# Patient Record
Sex: Male | Born: 1935 | Race: White | Hispanic: No | Marital: Married | Smoking: Former smoker
Health system: Southern US, Community
[De-identification: ages and names within clinical notes are randomized; demographics above are authoritative.]

## PROBLEM LIST (undated history)

## (undated) DIAGNOSIS — I480 Paroxysmal atrial fibrillation: Secondary | ICD-10-CM

## (undated) DIAGNOSIS — Z85828 Personal history of other malignant neoplasm of skin: Secondary | ICD-10-CM

## (undated) DIAGNOSIS — N183 Chronic kidney disease, stage 3 unspecified: Secondary | ICD-10-CM

## (undated) DIAGNOSIS — I10 Essential (primary) hypertension: Secondary | ICD-10-CM

## (undated) DIAGNOSIS — N200 Calculus of kidney: Secondary | ICD-10-CM

## (undated) DIAGNOSIS — R58 Hemorrhage, not elsewhere classified: Secondary | ICD-10-CM

## (undated) DIAGNOSIS — I82409 Acute embolism and thrombosis of unspecified deep veins of unspecified lower extremity: Secondary | ICD-10-CM

## (undated) DIAGNOSIS — K5790 Diverticulosis of intestine, part unspecified, without perforation or abscess without bleeding: Secondary | ICD-10-CM

## (undated) DIAGNOSIS — Z86718 Personal history of other venous thrombosis and embolism: Secondary | ICD-10-CM

## (undated) DIAGNOSIS — L97909 Non-pressure chronic ulcer of unspecified part of unspecified lower leg with unspecified severity: Secondary | ICD-10-CM

## (undated) DIAGNOSIS — K219 Gastro-esophageal reflux disease without esophagitis: Secondary | ICD-10-CM

## (undated) DIAGNOSIS — N179 Acute kidney failure, unspecified: Secondary | ICD-10-CM

## (undated) DIAGNOSIS — I8390 Asymptomatic varicose veins of unspecified lower extremity: Secondary | ICD-10-CM

## (undated) DIAGNOSIS — E785 Hyperlipidemia, unspecified: Secondary | ICD-10-CM

## (undated) DIAGNOSIS — N281 Cyst of kidney, acquired: Secondary | ICD-10-CM

## (undated) DIAGNOSIS — E119 Type 2 diabetes mellitus without complications: Secondary | ICD-10-CM

## (undated) DIAGNOSIS — I70299 Other atherosclerosis of native arteries of extremities, unspecified extremity: Secondary | ICD-10-CM

## (undated) DIAGNOSIS — K439 Ventral hernia without obstruction or gangrene: Secondary | ICD-10-CM

## (undated) DIAGNOSIS — M1990 Unspecified osteoarthritis, unspecified site: Secondary | ICD-10-CM

## (undated) HISTORY — DX: Other atherosclerosis of native arteries of extremities, unspecified extremity: I70.299

## (undated) HISTORY — DX: Ventral hernia without obstruction or gangrene: K43.9

## (undated) HISTORY — DX: Asymptomatic varicose veins of unspecified lower extremity: I83.90

## (undated) HISTORY — DX: Diverticulosis of intestine, part unspecified, without perforation or abscess without bleeding: K57.90

## (undated) HISTORY — DX: Calculus of kidney: N20.0

## (undated) HISTORY — PX: HERNIA REPAIR: SHX51

## (undated) HISTORY — DX: Chronic kidney disease, stage 3 unspecified: N18.30

## (undated) HISTORY — DX: Acute embolism and thrombosis of unspecified deep veins of unspecified lower extremity: I82.409

## (undated) HISTORY — DX: Paroxysmal atrial fibrillation: I48.0

## (undated) HISTORY — DX: Non-pressure chronic ulcer of unspecified part of unspecified lower leg with unspecified severity: L97.909

## (undated) HISTORY — DX: Chronic kidney disease, stage 3 (moderate): N18.3

## (undated) HISTORY — DX: Essential (primary) hypertension: I10

## (undated) HISTORY — DX: Type 2 diabetes mellitus without complications: E11.9

## (undated) HISTORY — DX: Hyperlipidemia, unspecified: E78.5

## (undated) HISTORY — DX: Personal history of other venous thrombosis and embolism: Z86.718

## (undated) HISTORY — DX: Hemorrhage, not elsewhere classified: R58

## (undated) HISTORY — DX: Cyst of kidney, acquired: N28.1

## (undated) HISTORY — DX: Personal history of other malignant neoplasm of skin: Z85.828

---

## 1958-10-30 HISTORY — PX: SKIN GRAFT: SHX250

## 1968-10-29 HISTORY — PX: OTHER SURGICAL HISTORY: SHX169

## 1981-02-28 HISTORY — PX: CHOLECYSTECTOMY: SHX55

## 1998-02-28 HISTORY — PX: CATARACT EXTRACTION: SUR2

## 2004-02-09 ENCOUNTER — Ambulatory Visit: Payer: Self-pay | Admitting: Cardiology

## 2004-02-29 HISTORY — PX: ESOPHAGOGASTRODUODENOSCOPY: SHX1529

## 2004-03-03 ENCOUNTER — Ambulatory Visit: Payer: Self-pay | Admitting: Internal Medicine

## 2004-03-05 ENCOUNTER — Ambulatory Visit (HOSPITAL_COMMUNITY): Admission: RE | Admit: 2004-03-05 | Discharge: 2004-03-05 | Payer: Self-pay | Admitting: Internal Medicine

## 2004-03-05 ENCOUNTER — Ambulatory Visit: Payer: Self-pay | Admitting: Internal Medicine

## 2004-03-08 ENCOUNTER — Ambulatory Visit (HOSPITAL_COMMUNITY): Admission: RE | Admit: 2004-03-08 | Discharge: 2004-03-08 | Payer: Self-pay | Admitting: Internal Medicine

## 2005-02-28 DIAGNOSIS — Z86718 Personal history of other venous thrombosis and embolism: Secondary | ICD-10-CM

## 2005-02-28 HISTORY — DX: Personal history of other venous thrombosis and embolism: Z86.718

## 2006-02-03 ENCOUNTER — Ambulatory Visit (HOSPITAL_COMMUNITY): Admission: RE | Admit: 2006-02-03 | Discharge: 2006-02-04 | Payer: Self-pay | Admitting: Specialist

## 2006-02-28 HISTORY — PX: ROTATOR CUFF REPAIR: SHX139

## 2006-03-02 ENCOUNTER — Ambulatory Visit: Payer: Self-pay | Admitting: Internal Medicine

## 2006-04-06 ENCOUNTER — Ambulatory Visit (HOSPITAL_COMMUNITY): Admission: RE | Admit: 2006-04-06 | Discharge: 2006-04-06 | Payer: Self-pay | Admitting: Internal Medicine

## 2006-04-06 ENCOUNTER — Ambulatory Visit: Payer: Self-pay | Admitting: Internal Medicine

## 2006-04-06 HISTORY — PX: COLONOSCOPY: SHX174

## 2006-08-08 ENCOUNTER — Ambulatory Visit: Payer: Self-pay | Admitting: Cardiology

## 2006-08-11 ENCOUNTER — Ambulatory Visit: Payer: Self-pay | Admitting: Physician Assistant

## 2006-08-14 ENCOUNTER — Ambulatory Visit: Payer: Self-pay | Admitting: Physician Assistant

## 2006-08-17 ENCOUNTER — Ambulatory Visit: Payer: Self-pay | Admitting: Physician Assistant

## 2006-08-21 ENCOUNTER — Ambulatory Visit: Payer: Self-pay | Admitting: Physician Assistant

## 2006-09-20 ENCOUNTER — Ambulatory Visit: Payer: Self-pay | Admitting: Cardiology

## 2006-10-18 ENCOUNTER — Ambulatory Visit: Payer: Self-pay | Admitting: Cardiology

## 2006-11-01 ENCOUNTER — Ambulatory Visit: Payer: Self-pay | Admitting: Cardiology

## 2006-11-23 ENCOUNTER — Ambulatory Visit: Payer: Self-pay | Admitting: Cardiology

## 2006-12-01 ENCOUNTER — Ambulatory Visit: Payer: Self-pay | Admitting: Cardiology

## 2006-12-07 ENCOUNTER — Ambulatory Visit: Payer: Self-pay | Admitting: Cardiology

## 2007-01-02 ENCOUNTER — Ambulatory Visit: Payer: Self-pay | Admitting: Cardiology

## 2007-03-01 HISTORY — PX: REPLACEMENT TOTAL KNEE: SUR1224

## 2007-03-16 ENCOUNTER — Inpatient Hospital Stay (HOSPITAL_COMMUNITY): Admission: RE | Admit: 2007-03-16 | Discharge: 2007-03-20 | Payer: Self-pay | Admitting: Specialist

## 2007-04-12 ENCOUNTER — Emergency Department (HOSPITAL_COMMUNITY): Admission: EM | Admit: 2007-04-12 | Discharge: 2007-04-12 | Payer: Self-pay | Admitting: Emergency Medicine

## 2007-05-14 ENCOUNTER — Ambulatory Visit: Payer: Self-pay | Admitting: Cardiology

## 2007-05-14 ENCOUNTER — Encounter: Admission: RE | Admit: 2007-05-14 | Discharge: 2007-05-14 | Payer: Self-pay | Admitting: Specialist

## 2007-05-18 ENCOUNTER — Ambulatory Visit: Payer: Self-pay | Admitting: Cardiology

## 2007-05-21 ENCOUNTER — Ambulatory Visit: Payer: Self-pay | Admitting: Cardiology

## 2007-05-28 ENCOUNTER — Ambulatory Visit: Payer: Self-pay | Admitting: Cardiology

## 2007-06-25 ENCOUNTER — Ambulatory Visit: Payer: Self-pay | Admitting: Cardiology

## 2007-07-11 ENCOUNTER — Ambulatory Visit: Payer: Self-pay | Admitting: Cardiology

## 2007-07-24 ENCOUNTER — Ambulatory Visit: Payer: Self-pay | Admitting: Cardiology

## 2007-08-10 ENCOUNTER — Ambulatory Visit: Payer: Self-pay | Admitting: Cardiology

## 2007-09-14 ENCOUNTER — Ambulatory Visit: Payer: Self-pay | Admitting: Cardiology

## 2007-10-12 ENCOUNTER — Ambulatory Visit: Payer: Self-pay | Admitting: Cardiology

## 2007-10-19 ENCOUNTER — Ambulatory Visit: Payer: Self-pay | Admitting: Cardiology

## 2007-11-09 ENCOUNTER — Ambulatory Visit: Payer: Self-pay | Admitting: Cardiology

## 2007-12-07 ENCOUNTER — Ambulatory Visit: Payer: Self-pay | Admitting: Cardiology

## 2007-12-28 ENCOUNTER — Ambulatory Visit: Payer: Self-pay | Admitting: Cardiology

## 2008-01-29 ENCOUNTER — Ambulatory Visit: Payer: Self-pay | Admitting: Cardiology

## 2008-02-26 ENCOUNTER — Ambulatory Visit: Payer: Self-pay | Admitting: Cardiology

## 2008-03-25 ENCOUNTER — Ambulatory Visit: Payer: Self-pay | Admitting: Cardiology

## 2008-03-27 ENCOUNTER — Encounter: Admission: RE | Admit: 2008-03-27 | Discharge: 2008-03-27 | Payer: Self-pay | Admitting: Specialist

## 2008-04-08 ENCOUNTER — Ambulatory Visit: Payer: Self-pay | Admitting: Cardiology

## 2008-04-21 ENCOUNTER — Ambulatory Visit (HOSPITAL_COMMUNITY): Admission: RE | Admit: 2008-04-21 | Discharge: 2008-04-21 | Payer: Self-pay | Admitting: Specialist

## 2008-04-22 ENCOUNTER — Ambulatory Visit: Payer: Self-pay | Admitting: Cardiology

## 2008-05-13 ENCOUNTER — Ambulatory Visit: Payer: Self-pay | Admitting: Cardiology

## 2008-06-20 ENCOUNTER — Ambulatory Visit: Payer: Self-pay | Admitting: Cardiology

## 2008-07-11 ENCOUNTER — Encounter: Payer: Self-pay | Admitting: Cardiology

## 2008-07-14 LAB — ABI

## 2008-07-15 ENCOUNTER — Ambulatory Visit: Payer: Self-pay | Admitting: Cardiology

## 2008-08-12 ENCOUNTER — Ambulatory Visit: Payer: Self-pay | Admitting: Cardiology

## 2008-08-14 ENCOUNTER — Ambulatory Visit: Payer: Self-pay | Admitting: Cardiology

## 2008-08-15 ENCOUNTER — Encounter: Payer: Self-pay | Admitting: Cardiology

## 2008-09-02 ENCOUNTER — Ambulatory Visit: Payer: Self-pay | Admitting: Cardiology

## 2008-09-16 ENCOUNTER — Ambulatory Visit: Payer: Self-pay

## 2008-10-13 ENCOUNTER — Encounter: Payer: Self-pay | Admitting: *Deleted

## 2008-10-14 ENCOUNTER — Ambulatory Visit: Payer: Self-pay | Admitting: Cardiology

## 2008-10-14 LAB — CONVERTED CEMR LAB: POC INR: 2.7

## 2008-11-11 ENCOUNTER — Ambulatory Visit: Payer: Self-pay | Admitting: Cardiology

## 2008-11-20 ENCOUNTER — Encounter: Payer: Self-pay | Admitting: Cardiology

## 2008-11-21 ENCOUNTER — Ambulatory Visit: Payer: Self-pay | Admitting: Cardiology

## 2008-11-21 LAB — CONVERTED CEMR LAB: POC INR: 2.4

## 2008-12-12 ENCOUNTER — Ambulatory Visit: Payer: Self-pay | Admitting: Cardiology

## 2008-12-12 LAB — CONVERTED CEMR LAB: POC INR: 3.2

## 2008-12-15 DIAGNOSIS — I480 Paroxysmal atrial fibrillation: Secondary | ICD-10-CM

## 2008-12-15 DIAGNOSIS — I10 Essential (primary) hypertension: Secondary | ICD-10-CM | POA: Insufficient documentation

## 2008-12-15 DIAGNOSIS — E785 Hyperlipidemia, unspecified: Secondary | ICD-10-CM

## 2009-01-13 ENCOUNTER — Ambulatory Visit: Payer: Self-pay | Admitting: Cardiology

## 2009-02-10 ENCOUNTER — Ambulatory Visit: Payer: Self-pay | Admitting: Cardiology

## 2009-03-20 ENCOUNTER — Ambulatory Visit: Payer: Self-pay | Admitting: Cardiology

## 2009-04-21 ENCOUNTER — Ambulatory Visit: Payer: Self-pay | Admitting: Cardiology

## 2009-04-21 LAB — CONVERTED CEMR LAB: POC INR: 2

## 2009-05-15 ENCOUNTER — Ambulatory Visit: Payer: Self-pay | Admitting: Cardiology

## 2009-06-12 ENCOUNTER — Ambulatory Visit: Payer: Self-pay | Admitting: Cardiology

## 2009-06-12 LAB — CONVERTED CEMR LAB: POC INR: 2.9

## 2009-07-10 ENCOUNTER — Ambulatory Visit: Payer: Self-pay | Admitting: Cardiology

## 2009-07-10 LAB — CONVERTED CEMR LAB: POC INR: 2.5

## 2009-08-11 ENCOUNTER — Ambulatory Visit: Payer: Self-pay | Admitting: Cardiology

## 2009-08-11 LAB — CONVERTED CEMR LAB: POC INR: 3.4

## 2009-08-25 ENCOUNTER — Ambulatory Visit: Payer: Self-pay | Admitting: Cardiology

## 2009-09-22 ENCOUNTER — Ambulatory Visit: Payer: Self-pay | Admitting: Cardiology

## 2009-10-23 ENCOUNTER — Ambulatory Visit: Payer: Self-pay | Admitting: Cardiology

## 2009-10-23 LAB — CONVERTED CEMR LAB: POC INR: 3.6

## 2009-11-03 ENCOUNTER — Ambulatory Visit: Payer: Self-pay | Admitting: Cardiology

## 2009-11-17 ENCOUNTER — Ambulatory Visit: Payer: Self-pay | Admitting: Cardiology

## 2009-12-01 ENCOUNTER — Ambulatory Visit: Payer: Self-pay | Admitting: Cardiology

## 2009-12-01 LAB — CONVERTED CEMR LAB: POC INR: 2.7

## 2009-12-29 ENCOUNTER — Ambulatory Visit: Payer: Self-pay | Admitting: Cardiology

## 2009-12-29 LAB — CONVERTED CEMR LAB: POC INR: 2

## 2010-01-28 DIAGNOSIS — R58 Hemorrhage, not elsewhere classified: Secondary | ICD-10-CM

## 2010-01-28 HISTORY — DX: Hemorrhage, not elsewhere classified: R58

## 2010-01-29 ENCOUNTER — Ambulatory Visit: Payer: Self-pay | Admitting: Cardiology

## 2010-02-23 ENCOUNTER — Encounter: Payer: Self-pay | Admitting: Cardiology

## 2010-02-23 ENCOUNTER — Emergency Department (HOSPITAL_COMMUNITY)
Admission: EM | Admit: 2010-02-23 | Discharge: 2010-02-23 | Payer: Self-pay | Source: Home / Self Care | Admitting: Emergency Medicine

## 2010-02-23 ENCOUNTER — Encounter (INDEPENDENT_AMBULATORY_CARE_PROVIDER_SITE_OTHER): Payer: Self-pay | Admitting: Emergency Medicine

## 2010-02-26 ENCOUNTER — Ambulatory Visit: Payer: Self-pay | Admitting: Cardiology

## 2010-02-26 LAB — CONVERTED CEMR LAB: POC INR: 3.8

## 2010-03-05 ENCOUNTER — Encounter: Payer: Self-pay | Admitting: Cardiology

## 2010-03-05 LAB — CONVERTED CEMR LAB: POC INR: 2.6

## 2010-03-08 ENCOUNTER — Ambulatory Visit
Admission: RE | Admit: 2010-03-08 | Discharge: 2010-03-08 | Payer: Self-pay | Source: Home / Self Care | Attending: Cardiology | Admitting: Cardiology

## 2010-03-08 ENCOUNTER — Encounter: Payer: Self-pay | Admitting: Cardiology

## 2010-03-08 DIAGNOSIS — I82409 Acute embolism and thrombosis of unspecified deep veins of unspecified lower extremity: Secondary | ICD-10-CM | POA: Insufficient documentation

## 2010-03-08 DIAGNOSIS — R58 Hemorrhage, not elsewhere classified: Secondary | ICD-10-CM | POA: Insufficient documentation

## 2010-03-08 LAB — CONVERTED CEMR LAB: POC INR: 2.4

## 2010-03-18 ENCOUNTER — Encounter: Payer: Self-pay | Admitting: Cardiology

## 2010-03-21 ENCOUNTER — Encounter: Payer: Self-pay | Admitting: Specialist

## 2010-03-23 ENCOUNTER — Telehealth (INDEPENDENT_AMBULATORY_CARE_PROVIDER_SITE_OTHER): Payer: Self-pay | Admitting: *Deleted

## 2010-03-26 ENCOUNTER — Ambulatory Visit: Admission: RE | Admit: 2010-03-26 | Discharge: 2010-03-26 | Payer: Self-pay | Source: Home / Self Care

## 2010-03-30 NOTE — Medication Information (Signed)
Summary: ccr-lr  Anticoagulant Therapy  Managed by: Edrick Oh, RN Referring MD: Terald Sleeper PCP: Randa Spike MD: Domenic Polite MD, Mikeal Hawthorne Indication 1: Deep Vein Thrombosis - Leg (ICD-451.1) Lab Used: [place] Site: [facility] INR POC 2.8  Dietary changes: no    Health status changes: no    Bleeding/hemorrhagic complications: no    Recent/future hospitalizations: no    Any changes in medication regimen? no    Recent/future dental: no  Any missed doses?: no       Is patient compliant with meds? yes       Anticoagulation Management History:      The patient is taking warfarin and comes in today for a routine follow up visit.  Positive risk factors for bleeding include an age of 75 years or older.  The bleeding index is 'intermediate risk'.  Positive CHADS2 values include History of HTN and Age > 27 years old.  The start date was 08/11/2006.  Anticoagulation responsible provider: Domenic Polite MD, Mikeal Hawthorne.  INR POC: 2.8.  Cuvette Lot#: [id number].  Exp: 10/11.    Anticoagulation Management Assessment/Plan:      The patient's current anticoagulation dose is Warfarin sodium 5 mg tabs: take as directed by coumadin clinic, Warfarin sodium 7.5 mg tabs: Use as directed by Anticoagulation Clinic.  The target INR is 2 - 3.  The next INR is due 02/26/2010.  Anticoagulation instructions were given to patient.  Results were reviewed/authorized by Edrick Oh, RN.  He was notified by Edrick Oh RN.         Prior Anticoagulation Instructions: INR 2.0 Continue coumadin 7.5mg  once daily except 5mg  on Mondays and Fridays  Current Anticoagulation Instructions: INR 2.8 Continue coumadin 7.5mg  once daily except 5mg  on Mondays and Fridays

## 2010-03-30 NOTE — Medication Information (Signed)
Summary: ccr-lr  Anticoagulant Therapy  Managed by: Edrick Oh, RN Referring MD: Terald Sleeper PCP: Randa Spike MD: Dannielle Burn MD, Luvenia Heller Indication 1: Deep Vein Thrombosis - Leg (ICD-451.1) Lab Used: [place] Site: [facility] INR POC 2.0  Dietary changes: no    Health status changes: no    Bleeding/hemorrhagic complications: yes       Details: nose bleed x 1  Recent/future hospitalizations: no    Any changes in medication regimen? no    Recent/future dental: no  Any missed doses?: yes     Details: held 1 dose due to nose bleed  Is patient compliant with meds? no       Anticoagulation Management History:      The patient is taking warfarin and comes in today for a routine follow up visit.  Positive risk factors for bleeding include an age of 75 years or older.  The bleeding index is 'intermediate risk'.  Positive CHADS2 values include History of HTN and Age > 75 years old.  The start date was 08/11/2006.  Anticoagulation responsible provider: Dannielle Burn MD, Luvenia Heller.  INR POC: 2.0.  Cuvette Lot#: [id number].  Exp: 10/11.    Anticoagulation Management Assessment/Plan:      The patient's current anticoagulation dose is Warfarin sodium 5 mg tabs: take as directed by coumadin clinic, Warfarin sodium 7.5 mg tabs: Use as directed by Anticoagulation Clinic.  The target INR is 2 - 3.  The next INR is due 05/19/2009.  Anticoagulation instructions were given to patient.  Results were reviewed/authorized by Edrick Oh, RN.  He was notified by Edrick Oh RN.         Prior Anticoagulation Instructions: INR 2.7 Continue coumadin7.5mg  once daily except 5mg  Mondays and Fridays  Current Anticoagulation Instructions: INR 2.0 Continue coumadin 7.5mg  once daily except 5mg  on Mondays and Fridays

## 2010-03-30 NOTE — Medication Information (Signed)
Summary: ccr-lr  Anticoagulant Therapy  Managed by: Edrick Oh, RN Referring MD: Terald Sleeper PCP: Randa Spike MD: Dannielle Burn MD, Luvenia Heller Indication 1: Deep Vein Thrombosis - Leg (ICD-451.1) Lab Used: [place] Site: [facility] INR POC 2.0  Dietary changes: no    Health status changes: no    Bleeding/hemorrhagic complications: no    Recent/future hospitalizations: no    Any changes in medication regimen? no    Recent/future dental: no  Any missed doses?: yes     Details: missed 1 dose  Is patient compliant with meds? yes       Anticoagulation Management History:      The patient is taking warfarin and comes in today for a routine follow up visit.  Positive risk factors for bleeding include an age of 75 years or older.  The bleeding index is 'intermediate risk'.  Positive CHADS2 values include History of HTN and Age > 75 years old.  The start date was 08/11/2006.  Anticoagulation responsible provider: Dannielle Burn MD, Luvenia Heller.  INR POC: 2.0.  Cuvette Lot#: [id number].  Exp: 10/11.    Anticoagulation Management Assessment/Plan:      The patient's current anticoagulation dose is Warfarin sodium 5 mg tabs: take as directed by coumadin clinic, Warfarin sodium 7.5 mg tabs: Use as directed by Anticoagulation Clinic.  The target INR is 2 - 3.  The next INR is due 01/29/2010.  Anticoagulation instructions were given to patient.  Results were reviewed/authorized by Edrick Oh, RN.  He was notified by Edrick Oh RN.         Prior Anticoagulation Instructions: INR 2.7 Took last nights dose this morning Take coumadin 5mg  tonight then resume 7.5mg  once daily except 5mg  on Mondays and Fridays  Current Anticoagulation Instructions: INR 2.0 Continue coumadin 7.5mg  once daily except 5mg  on Mondays and Fridays

## 2010-03-30 NOTE — Medication Information (Signed)
Summary: CCR  Anticoagulant Therapy  Managed by: Edrick Oh, RN Referring MD: Terald Sleeper PCP: Randa Spike MD: Percival Spanish MD, Jeneen Rinks Indication 1: Deep Vein Thrombosis - Leg (ICD-451.1) Lab Used: [place] Site: [facility] INR POC 2.4  Dietary changes: no    Health status changes: no    Bleeding/hemorrhagic complications: no    Recent/future hospitalizations: no    Any changes in medication regimen? no    Recent/future dental: no  Any missed doses?: no       Is patient compliant with meds? yes       Anticoagulation Management History:      The patient is taking warfarin and comes in today for a routine follow up visit.  Positive risk factors for bleeding include an age of 11 years or older.  The bleeding index is 'intermediate risk'.  Positive CHADS2 values include History of HTN and Age > 29 years old.  The start date was 08/11/2006.  Anticoagulation responsible provider: Percival Spanish MD, Jeneen Rinks.  INR POC: 2.4.  Cuvette Lot#: [id number].  Exp: 10/11.    Anticoagulation Management Assessment/Plan:      The patient's current anticoagulation dose is Warfarin sodium 5 mg tabs: take as directed by coumadin clinic, Warfarin sodium 7.5 mg tabs: Use as directed by Anticoagulation Clinic.  The target INR is 2 - 3.  The next INR is due 11/17/2009.  Anticoagulation instructions were given to patient.  Results were reviewed/authorized by Edrick Oh, RN.  He was notified by Edrick Oh RN.         Prior Anticoagulation Instructions: INR 3.6 Hold coumadin tonight then resume 7.5mg  once daily except 5mg  on MOndays and Fridays  Current Anticoagulation Instructions: INR 2.4 Continue coumadin 7.5mg  once daily except 5mg  on Mondays and Fridays

## 2010-03-30 NOTE — Medication Information (Signed)
Summary: ccr-lr r/s due to weather 1/18  Anticoagulant Therapy  Managed by: Edrick Oh, RN Referring MD: Terald Sleeper PCP: Randa Spike MD: Domenic Polite MD, Mikeal Hawthorne Indication 1: Deep Vein Thrombosis - Leg (ICD-451.1) Lab Used: [place] Site: [facility] INR POC 2.7  Dietary changes: no    Health status changes: no    Bleeding/hemorrhagic complications: no    Recent/future hospitalizations: no    Any changes in medication regimen? no    Recent/future dental: no  Any missed doses?: no       Is patient compliant with meds? yes       Anticoagulation Management History:      The patient is taking warfarin and comes in today for a routine follow up visit.  Positive risk factors for bleeding include an age of 4 years or older.  The bleeding index is 'intermediate risk'.  Positive CHADS2 values include History of HTN and Age > 64 years old.  The start date was 08/11/2006.  Anticoagulation responsible provider: Domenic Polite MD, Mikeal Hawthorne.  INR POC: 2.7.  Cuvette Lot#: [id number].  Exp: 10/11.    Anticoagulation Management Assessment/Plan:      The patient's current anticoagulation dose is Warfarin sodium 5 mg tabs: take as directed by coumadin clinic, Warfarin sodium 7.5 mg tabs: Use as directed by Anticoagulation Clinic.  The target INR is 2 - 3.  The next INR is due 04/17/2009.  Anticoagulation instructions were given to patient.  Results were reviewed/authorized by Edrick Oh, RN.  He was notified by Edrick Oh RN.         Prior Anticoagulation Instructions: INR 2.2 Continue coumadin 7.5mg  once daily except 5mg  on Mondays and Fridays  Current Anticoagulation Instructions: INR 2.7 Continue coumadin7.5mg  once daily except 5mg  Mondays and Fridays

## 2010-03-30 NOTE — Medication Information (Signed)
Summary: ccr-lr  Anticoagulant Therapy  Managed by: Edrick Oh, RN Referring MD: Terald Sleeper PCP: Randa Spike MD: Dannielle Burn MD, Luvenia Heller Indication 1: Deep Vein Thrombosis - Leg (ICD-451.1) Lab Used: [place] Site: [facility] INR POC 3.4  Dietary changes: no    Health status changes: no    Bleeding/hemorrhagic complications: no    Recent/future hospitalizations: no    Any changes in medication regimen? no    Recent/future dental: no  Any missed doses?: no       Is patient compliant with meds? yes       Anticoagulation Management History:      The patient is taking warfarin and comes in today for a routine follow up visit.  Positive risk factors for bleeding include an age of 75 years or older.  The bleeding index is 'intermediate risk'.  Positive CHADS2 values include History of HTN and Age > 83 years old.  The start date was 08/11/2006.  Anticoagulation responsible provider: Dannielle Burn MD, Luvenia Heller.  INR POC: 3.4.  Cuvette Lot#: [id number].  Exp: 10/11.    Anticoagulation Management Assessment/Plan:      The patient's current anticoagulation dose is Warfarin sodium 5 mg tabs: take as directed by coumadin clinic, Warfarin sodium 7.5 mg tabs: Use as directed by Anticoagulation Clinic.  The target INR is 2 - 3.  The next INR is due 08/25/2009.  Anticoagulation instructions were given to patient.  Results were reviewed/authorized by Edrick Oh, RN.  He was notified by Edrick Oh RN.         Prior Anticoagulation Instructions: INR 2.5 Continue coumadin 7.5mg  once daily except 5mg  on M,F  Current Anticoagulation Instructions: INR 3.4 Hold coumadin tonight then resume 7.5mg  once daily except 5mg  on Mondays and Fridays

## 2010-03-30 NOTE — Medication Information (Signed)
Summary: ccr-lr  Anticoagulant Therapy  Managed by: Edrick Oh, RN Referring MD: Terald Sleeper PCP: Randa Spike MD: Ron Parker MD, Dellis Filbert Indication 1: Deep Vein Thrombosis - Leg (ICD-451.1) Lab Used: [place] Site: [facility] INR POC 2.7  Dietary changes: no    Health status changes: no    Bleeding/hemorrhagic complications: no    Recent/future hospitalizations: no    Any changes in medication regimen? no    Recent/future dental: no  Any missed doses?: yes     Details: missed dose last night then took dose this morning  Is patient compliant with meds? yes       Anticoagulation Management History:      The patient is taking warfarin and comes in today for a routine follow up visit.  Positive risk factors for bleeding include an age of 75 years or older.  The bleeding index is 'intermediate risk'.  Positive CHADS2 values include History of HTN and Age > 18 years old.  The start date was 08/11/2006.  Anticoagulation responsible provider: Ron Parker MD, Dellis Filbert.  INR POC: 2.7.  Cuvette Lot#: [id number].  Exp: 10/11.    Anticoagulation Management Assessment/Plan:      The patient's current anticoagulation dose is Warfarin sodium 5 mg tabs: take as directed by coumadin clinic, Warfarin sodium 7.5 mg tabs: Use as directed by Anticoagulation Clinic.  The target INR is 2 - 3.  The next INR is due 12/29/2009.  Anticoagulation instructions were given to patient.  Results were reviewed/authorized by Edrick Oh, RN.  He was notified by Edrick Oh RN.         Prior Anticoagulation Instructions: INR 1.8 Take coumadin 10mg  tonight and tomorrow night then resume 7.5mg  once daily except 5mg  on Mondays and Fridays   Current Anticoagulation Instructions: INR 2.7 Took last nights dose this morning Take coumadin 5mg  tonight then resume 7.5mg  once daily except 5mg  on Mondays and Fridays

## 2010-03-30 NOTE — Medication Information (Signed)
Summary: ccr-lr  Anticoagulant Therapy  Managed by: Edrick Oh, RN Referring MD: Terald Sleeper PCP: Randa Spike MD: Dannielle Burn MD, Luvenia Heller Indication 1: Deep Vein Thrombosis - Leg (ICD-451.1) Lab Used: [place] Site: [facility] INR POC 2.9  Dietary changes: no    Health status changes: no    Bleeding/hemorrhagic complications: no    Recent/future hospitalizations: no    Any changes in medication regimen? no    Recent/future dental: no  Any missed doses?: no       Is patient compliant with meds? yes       Anticoagulation Management History:      The patient is taking warfarin and comes in today for a routine follow up visit.  Positive risk factors for bleeding include an age of 65 years or older.  The bleeding index is 'intermediate risk'.  Positive CHADS2 values include History of HTN and Age > 79 years old.  The start date was 08/11/2006.  Anticoagulation responsible provider: Dannielle Burn MD, Luvenia Heller.  INR POC: 2.9.  Cuvette Lot#: [id number].  Exp: 10/11.    Anticoagulation Management Assessment/Plan:      The patient's current anticoagulation dose is Warfarin sodium 5 mg tabs: take as directed by coumadin clinic, Warfarin sodium 7.5 mg tabs: Use as directed by Anticoagulation Clinic.  The target INR is 2 - 3.  The next INR is due 07/10/2009.  Anticoagulation instructions were given to patient.  Results were reviewed/authorized by Edrick Oh, RN.  He was notified by Edrick Oh RN.         Prior Anticoagulation Instructions: INR 2.9 Continue coumadin 7.5mg  once daily except 5mg  on Mondays and Fridays  Current Anticoagulation Instructions: Same as Prior Instructions.

## 2010-03-30 NOTE — Medication Information (Signed)
Summary: ccr-lr  Anticoagulant Therapy  Managed by: Edrick Oh, RN Referring MD: Terald Sleeper PCP: Randa Spike MD: Ron Parker MD, Dellis Filbert Indication 1: Deep Vein Thrombosis - Leg (ICD-451.1) Lab Used: [place] Site: [facility] INR POC 2.5  Dietary changes: no    Health status changes: no    Bleeding/hemorrhagic complications: no    Recent/future hospitalizations: no    Any changes in medication regimen? no    Recent/future dental: no  Any missed doses?: no       Is patient compliant with meds? yes       Anticoagulation Management History:      The patient is taking warfarin and comes in today for a routine follow up visit.  Positive risk factors for bleeding include an age of 75 years or older.  The bleeding index is 'intermediate risk'.  Positive CHADS2 values include History of HTN and Age > 74 years old.  The start date was 08/11/2006.  Anticoagulation responsible provider: Ron Parker MD, Dellis Filbert.  INR POC: 2.5.  Cuvette Lot#: [id number].  Exp: 10/11.    Anticoagulation Management Assessment/Plan:      The patient's current anticoagulation dose is Warfarin sodium 5 mg tabs: take as directed by coumadin clinic, Warfarin sodium 7.5 mg tabs: Use as directed by Anticoagulation Clinic.  The target INR is 2 - 3.  The next INR is due 08/11/2009.  Anticoagulation instructions were given to patient.  Results were reviewed/authorized by Edrick Oh, RN.  He was notified by Edrick Oh RN.         Prior Anticoagulation Instructions: INR 2.9 Continue coumadin 7.5mg  once daily except 5mg  on Mondays and Fridays  Current Anticoagulation Instructions: INR 2.5 Continue coumadin 7.5mg  once daily except 5mg  on M,F

## 2010-03-30 NOTE — Medication Information (Signed)
Summary: ccr-lr  Anticoagulant Therapy  Managed by: Edrick Oh, RN Referring MD: Terald Sleeper PCP: Randa Spike MD: Dannielle Burn MD, Luvenia Heller Indication 1: Deep Vein Thrombosis - Leg (ICD-451.1) Lab Used: [place] Site: [facility] INR POC 1.9  Dietary changes: no    Health status changes: no    Bleeding/hemorrhagic complications: no    Recent/future hospitalizations: no    Any changes in medication regimen? no    Recent/future dental: no  Any missed doses?: no       Is patient compliant with meds? yes       Anticoagulation Management History:      The patient is taking warfarin and comes in today for a routine follow up visit.  Positive risk factors for bleeding include an age of 75 years or older.  The bleeding index is 'intermediate risk'.  Positive CHADS2 values include History of HTN and Age > 28 years old.  The start date was 08/11/2006.  Anticoagulation responsible provider: Dannielle Burn MD, Luvenia Heller.  INR POC: 1.9.  Cuvette Lot#: [id number].  Exp: 10/11.    Anticoagulation Management Assessment/Plan:      The patient's current anticoagulation dose is Warfarin sodium 5 mg tabs: take as directed by coumadin clinic, Warfarin sodium 7.5 mg tabs: Use as directed by Anticoagulation Clinic.  The target INR is 2 - 3.  The next INR is due 09/22/2009.  Anticoagulation instructions were given to patient.  Results were reviewed/authorized by Edrick Oh, RN.  He was notified by Edrick Oh RN.         Prior Anticoagulation Instructions: INR 3.4 Hold coumadin tonight then resume 7.5mg  once daily except 5mg  on Mondays and Fridays  Current Anticoagulation Instructions: INR 1.9 Take coumadin 10mg  tonight then resume 7.5mg  once daily except 5mg  on Mondays and Fridays

## 2010-03-30 NOTE — Medication Information (Signed)
Summary: ccr-lr  Anticoagulant Therapy  Managed by: Edrick Oh, RN Referring MD: Terald Sleeper PCP: Randa Spike MD: Domenic Polite MD, Mikeal Hawthorne Indication 1: Deep Vein Thrombosis - Leg (ICD-451.1) Lab Used: [place] Site: [facility] INR POC 2.7  Dietary changes: no    Health status changes: no    Bleeding/hemorrhagic complications: no    Recent/future hospitalizations: no    Any changes in medication regimen? no    Recent/future dental: no  Any missed doses?: no       Is patient compliant with meds? yes       Anticoagulation Management History:      The patient is taking warfarin and comes in today for a routine follow up visit.  Positive risk factors for bleeding include an age of 75 years or older.  The bleeding index is 'intermediate risk'.  Positive CHADS2 values include History of HTN and Age > 6 years old.  The start date was 08/11/2006.  Anticoagulation responsible provider: Domenic Polite MD, Mikeal Hawthorne.  INR POC: 2.7.  Cuvette Lot#: [id number].  Exp: 10/11.    Anticoagulation Management Assessment/Plan:      The patient's current anticoagulation dose is Warfarin sodium 5 mg tabs: take as directed by coumadin clinic, Warfarin sodium 7.5 mg tabs: Use as directed by Anticoagulation Clinic.  The target INR is 2 - 3.  The next INR is due 10/23/2009.  Anticoagulation instructions were given to patient.  Results were reviewed/authorized by Edrick Oh, RN.  He was notified by Edrick Oh RN.         Prior Anticoagulation Instructions: INR 1.9 Take coumadin 10mg  tonight then resume 7.5mg  once daily except 5mg  on Mondays and Fridays  Current Anticoagulation Instructions: INR 2.7 Continue coumadin 7.5mg  once daily except 5mg  on Mondays and Fridays

## 2010-03-30 NOTE — Medication Information (Signed)
Summary: CCR  Anticoagulant Therapy  Managed by: Edrick Oh, RN Referring MD: Terald Sleeper PCP: Randa Spike MD: Ron Parker MD, Dellis Filbert Indication 1: Deep Vein Thrombosis - Leg (ICD-451.1) Lab Used: [place] Site: [facility] INR POC 2.9  Dietary changes: no    Health status changes: no    Bleeding/hemorrhagic complications: no    Recent/future hospitalizations: no    Any changes in medication regimen? no    Recent/future dental: no  Any missed doses?: no       Is patient compliant with meds? yes       Anticoagulation Management History:      The patient is taking warfarin and comes in today for a routine follow up visit.  Positive risk factors for bleeding include an age of 75 years or older.  The bleeding index is 'intermediate risk'.  Positive CHADS2 values include History of HTN and Age > 36 years old.  The start date was 08/11/2006.  Anticoagulation responsible provider: Ron Parker MD, Dellis Filbert.  INR POC: 2.9.  Cuvette Lot#: [id number].  Exp: 10/11.    Anticoagulation Management Assessment/Plan:      The patient's current anticoagulation dose is Warfarin sodium 5 mg tabs: take as directed by coumadin clinic, Warfarin sodium 7.5 mg tabs: Use as directed by Anticoagulation Clinic.  The target INR is 2 - 3.  The next INR is due 06/12/2009.  Anticoagulation instructions were given to patient.  Results were reviewed/authorized by Edrick Oh, RN.  He was notified by Edrick Oh RN.         Prior Anticoagulation Instructions: INR 2.0 Continue coumadin 7.5mg  once daily except 5mg  on Mondays and Fridays  Current Anticoagulation Instructions: INR 2.9 Continue coumadin 7.5mg  once daily except 5mg  on Mondays and Fridays Prescriptions: WARFARIN SODIUM 7.5 MG TABS (WARFARIN SODIUM) Use as directed by Anticoagulation Clinic  #90 x 2   Entered by:   Edrick Oh RN   Authorized by:   Terald Sleeper, MD, [facility]   Signed by:   Edrick Oh RN on 05/15/2009   Method used:    Electronically to        [facility] (retail)       [address]       [place], [place]  [address]       Ph: [phone]       Fax: [phone]   RxID:   [id number]

## 2010-03-30 NOTE — Medication Information (Signed)
Summary: ccr-lr  Anticoagulant Therapy  Managed by: Edrick Oh, RN Referring MD: Terald Sleeper PCP: Randa Spike MD: Ron Parker MD, Dellis Filbert Indication 1: Deep Vein Thrombosis - Leg (ICD-451.1) Lab Used: [place] Site: [facility] INR POC 3.6  Dietary changes: no    Health status changes: no    Bleeding/hemorrhagic complications: no    Recent/future hospitalizations: no    Any changes in medication regimen? yes       Details: Is on antibiotic for removal of skin cancer  Recent/future dental: no  Any missed doses?: no       Is patient compliant with meds? yes       Anticoagulation Management History:      The patient is taking warfarin and comes in today for a routine follow up visit.  Positive risk factors for bleeding include an age of 75 years or older.  The bleeding index is 'intermediate risk'.  Positive CHADS2 values include History of HTN and Age > 62 years old.  The start date was 08/11/2006.  Anticoagulation responsible Mylo Driskill: Ron Parker MD, Dellis Filbert.  INR POC: 3.6.  Cuvette Lot#: [id number].  Exp: 10/11.    Anticoagulation Management Assessment/Plan:      The patient's current anticoagulation dose is Warfarin sodium 5 mg tabs: take as directed by coumadin clinic, Warfarin sodium 7.5 mg tabs: Use as directed by Anticoagulation Clinic.  The target INR is 2 - 3.  The next INR is due 11/20/2009.  Anticoagulation instructions were given to patient.  Results were reviewed/authorized by Edrick Oh, RN.  He was notified by Edrick Oh RN.         Prior Anticoagulation Instructions: INR 2.7 Continue coumadin 7.5mg  once daily except 5mg  on Mondays and Fridays  Current Anticoagulation Instructions: INR 3.6 Hold coumadin tonight then resume 7.5mg  once daily except 5mg  on MOndays and Fridays

## 2010-03-30 NOTE — Medication Information (Signed)
Summary: ccr-lr  Anticoagulant Therapy  Managed by: Edrick Oh, RN Referring MD: Terald Sleeper PCP: Randa Spike MD: Percival Spanish MD, Jeneen Rinks Indication 1: Deep Vein Thrombosis - Leg (ICD-451.1) Lab Used: [place] Site: [facility] INR POC 1.8  Dietary changes: no    Health status changes: no    Bleeding/hemorrhagic complications: no    Recent/future hospitalizations: no    Any changes in medication regimen? yes       Details: On abx for derm surgery  Recent/future dental: no  Any missed doses?: no       Is patient compliant with meds? yes       Anticoagulation Management History:      The patient is taking warfarin and comes in today for a routine follow up visit.  Positive risk factors for bleeding include an age of 21 years or older.  The bleeding index is 'intermediate risk'.  Positive CHADS2 values include History of HTN and Age > 72 years old.  The start date was 08/11/2006.  Anticoagulation responsible provider: Percival Spanish MD, Jeneen Rinks.  INR POC: 1.8.  Cuvette Lot#: [id number].  Exp: 10/11.    Anticoagulation Management Assessment/Plan:      The patient's current anticoagulation dose is Warfarin sodium 5 mg tabs: take as directed by coumadin clinic, Warfarin sodium 7.5 mg tabs: Use as directed by Anticoagulation Clinic.  The target INR is 2 - 3.  The next INR is due 12/01/2009.  Anticoagulation instructions were given to patient.  Results were reviewed/authorized by Edrick Oh, RN.  He was notified by Edrick Oh RN.         Prior Anticoagulation Instructions: INR 2.4 Continue coumadin 7.5mg  once daily except 5mg  on Mondays and Fridays  Current Anticoagulation Instructions: INR 1.8 Take coumadin 10mg  tonight and tomorrow night then resume 7.5mg  once daily except 5mg  on Mondays and Fridays  Prescriptions: WARFARIN SODIUM 5 MG TABS (WARFARIN SODIUM) take as directed by coumadin clinic  #90 x 3   Entered by:   Edrick Oh RN   Authorized by:    Terald Sleeper, MD, [facility]   Signed by:   Edrick Oh RN on 11/17/2009   Method used:   Electronically to        [facility] (retail)       [address]       [place], [place]  [address]       Ph: [phone]       Fax: [phone]   RxID:   [id number]

## 2010-04-01 NOTE — Medication Information (Signed)
Summary: ccr-lr  Anticoagulant Therapy  Managed by: Edrick Oh, RN Referring MD: Terald Sleeper PCP: Randa Spike MD: Domenic Polite MD, Mikeal Hawthorne Indication 1: Deep Vein Thrombosis - Leg (ICD-451.1) Lab Used: [place] Site: [facility] INR POC 3.8  Dietary changes: no    Health status changes: yes       Details: Has blood clot in joint of knee per pt   Saw Dr Theda Sers yesterday  Bleeding/hemorrhagic complications: no    Recent/future hospitalizations: yes       Details: pending knee surgery afterswelling goes down  Has appt with Dr Dannielle Burn on 03/08/10  Any changes in medication regimen? yes       Details: started on Oxycodone 5mg  prn and Robaxin 500mg  prn  Recent/future dental: no  Any missed doses?: no       Is patient compliant with meds? yes       Anticoagulation Management History:      The patient is taking warfarin and comes in today for a routine follow up visit.  Positive risk factors for bleeding include an age of 22 years or older.  The bleeding index is 'intermediate risk'.  Positive CHADS2 values include History of HTN and Age > 27 years old.  The start date was 08/11/2006.  Anticoagulation responsible provider: Domenic Polite MD, Mikeal Hawthorne.  INR POC: 3.8.  Exp: 10/11.    Anticoagulation Management Assessment/Plan:      The patient's current anticoagulation dose is Warfarin sodium 5 mg tabs: take as directed by coumadin clinic, Warfarin sodium 7.5 mg tabs: Use as directed by Anticoagulation Clinic.  The target INR is 2 - 3.  The next INR is due 03/08/2010.  Anticoagulation instructions were given to patient.  Results were reviewed/authorized by Edrick Oh, RN.  He was notified by Edrick Oh RN.         Prior Anticoagulation Instructions: INR 2.8 Continue coumadin 7.5mg  once daily except 5mg  on Mondays and Fridays  Current Anticoagulation Instructions: INR 3.8 Pending knee surgery.  On oxycodone Hold coumadin tonight then resume 7.5mg  once daily  except 5mg  on Mondays and Fridays

## 2010-04-01 NOTE — Assessment & Plan Note (Signed)
Summary: cardiac clearance for knee surgery/needs INR --agh   Visit Type:  Pre-op Evaluation Primary Provider:  Quillian Quince  CC:  need clearance for right knee surgery.  History of Present Illness: the patient is a 77 year old male with a history of recurrent DVTs.he has a history of paroxysmal atrial fibrillation with high CHADS2 score. He has a history of diabetes mellitus hypertension and dyslipidemia. However he had a normal adenosine stress study in October of 2008.  The patient has been referred for preoperative evaluation. He apparently needs a right total knee replacement. However the patient has recently suffered a spontaneous hemorrhage in the right thigh. There is evidence of hematoma in the back of the thigh and inner side of the right thigh extending to the knee and further down into the shin of the right leg. The patient does not recall falling or tripping. This occurred on Christmas day. On 27 December he had an INR checked which was greater than 3. He was ruled out for DVTand had negative Dopplers. A Baker cyst was seen in an attempt at aspiration was made. Following this he also had an aspiration of the right knee but there was no significant amount of blood.  The patient is currently normal sinus rhythm by EKG. He reports no palpitations. He reports no chest pain or shortness of breath.  Preventive Screening-Counseling & Management  Alcohol-Tobacco     Smoking Status: quit     Year Quit: 1952  Problems Prior to Update: 1)  Hypertension, Unspecified  (ICD-401.9) 2)  Hyperlipidemia-mixed  (ICD-272.4) 3)  Atrial Fibrillation  (ICD-427.31)  Medications Prior to Update: 1)  Warfarin Sodium 5 Mg Tabs (Warfarin Sodium) .... Take As Directed By Coumadin Clinic 2)  Warfarin Sodium 7.5 Mg Tabs (Warfarin Sodium) .... Use As Directed By Anticoagulation Clinic 3)  Glipizide 10 Mg Tabs (Glipizide) .... Take 1 Tablet By Mouth Once A Day 4)  Lisinopril-Hydrochlorothiazide 20-12.5 Mg Tabs  (Lisinopril-Hydrochlorothiazide) .... Take 1 Tablet By Mouth Once A Day 5)  Simvastatin 20 Mg Tabs (Simvastatin) .... Take 1 Tablet By Mouth Once A Day 6)  Aspir-Low 81 Mg Tbec (Aspirin) .... Take 1 Tablet By Mouth Once A Day 7)  Glucosamine 500 Mg Caps (Glucosamine Sulfate) .... Take 1 Tablet By Mouth Once A Day 8)  Fish Oil 1000 Mg Caps (Omega-3 Fatty Acids) .... Take 1 Tablet By Mouth Once A Day 9)  Omeprazole 40 Mg Cpdr (Omeprazole) .... Take 1 Tablet By Mouth Two Times A Day 10)  Vitamin D 1000 Unit Tabs (Cholecalciferol) .... Take 1 Tablet By Mouth Once A Day  Current Medications (verified): 1)  Warfarin Sodium 5 Mg Tabs (Warfarin Sodium) .... Take As Directed By Coumadin Clinic 2)  Warfarin Sodium 7.5 Mg Tabs (Warfarin Sodium) .... Use As Directed By Anticoagulation Clinic 3)  Glipizide 10 Mg Tabs (Glipizide) .... Take 1 Tablet By Mouth Once A Day 4)  Lisinopril-Hydrochlorothiazide 20-12.5 Mg Tabs (Lisinopril-Hydrochlorothiazide) .... Take 1 Tablet By Mouth Once A Day 5)  Simvastatin 20 Mg Tabs (Simvastatin) .... Take 1 Tablet By Mouth Once A Day 6)  Aspir-Low 81 Mg Tbec (Aspirin) .... Take 1 Tablet By Mouth Every Other Day 7)  Fish Oil 1000 Mg Caps (Omega-3 Fatty Acids) .... Take 1 Tablet By Mouth Once A Day 8)  Omeprazole 40 Mg Cpdr (Omeprazole) .... Take 1 Tablet By Mouth Two Times A Day 9)  Preservision Areds 2  Caps (Multiple Vitamins-Minerals) .... Take 1 Tablet By Mouth Two Times A Day 10)  Venastat 300 Mg Cr-Caps ([place]) .... Take 1 Tablet By Mouth Once A Day  Allergies (verified): No Known Drug Allergies  Comments:  Nurse/Medical Assistant: The patient's medication list and allergies were reviewed with the patient and were updated in the Medication and Allergy Lists.  Past History:  Past Medical History: HYPERTENSION, UNSPECIFIED (ICD-401.9) HYPERLIPIDEMIA-MIXED (ICD-272.4) ATRIAL FIBRILLATION (ICD-427.31) Type 2 diabetes mellitus Recurrent lower extremity  DVT History of paroxysmal atrial fibrillation during exercise testing December 2000 CHADS2 score is 3 Spontaneous hemorrhage of right thigh with possible over anticoagulation  Social History: Smoking Status:  quit  Review of Systems       The patient complains of joint pain and leg swelling.  The patient denies fatigue, malaise, fever, weight gain/loss, vision loss, decreased hearing, hoarseness, chest pain, palpitations, shortness of breath, prolonged cough, wheezing, sleep apnea, coughing up blood, abdominal pain, blood in stool, nausea, vomiting, diarrhea, heartburn, incontinence, blood in urine, muscle weakness, rash, skin lesions, headache, fainting, dizziness, depression, anxiety, enlarged lymph nodes, easy bruising or bleeding, and environmental allergies.    Vital Signs:  Patient profile:   77 year old male Height:      70 inches Weight:      223 pounds BMI:     32.11 Pulse rate:   80 / minute BP sitting:   109 / 67  (left arm) Cuff size:   large  Vitals Entered By: Georgina Peer (March 08, 2010 10:24 AM)  Nutrition Counseling: Patient's BMI is greater than 25 and therefore counseled on weight management options. CC: need clearance for right knee surgery   Physical Exam  Additional Exam:  General: Well-developed, well-nourished in no distress head: Normocephalic and atraumatic eyes PERRLA/EOMI intact, conjunctiva and lids normal nose: No deformity or lesions mouth normal dentition, normal posterior pharynx neck: Supple, no JVD.  No masses, thyromegaly or abnormal cervical nodes lungs: Normal breath sounds bilaterally without wheezing.  Normal percussion heart: regular rate and rhythm with normal S1 and S2, no S3 or S4.  PMI is normal.  No pathological murmurs abdomen: Normal bowel sounds, abdomen is soft and nontender without masses, organomegaly or hernias noted.  No hepatosplenomegaly musculoskeletal: Back normal, normal gait muscle strength and tone  normal pulsus: Pulse is normal in all 4 extremities Extremities: No peripheral pitting edema, resolving hematoma of the right thigh particularly the inner aspect in the back. Swelling of the right knee and old hematoma in the lower extremity. neurologic: Alert and oriented x 3 skin: Intact without lesions or rashes cervical nodes: No significant adenopathy psychologic: Normal affect    Impression & Recommendations:  Problem # 1:  UNSPECIFIED HEMORRHAGE (ICD-459.0) the patient had a spontaneous hemorrhage in the right thigh on Coumadin. I suspect his Coumadin level was elevated at least several days later was still greater than 3.0. He had significant hemorrhage with blood descending all the way into the lower part of his leg. It is also possible swelling of the right knee. The patient's INR today was 2.4 and there appears to be no further bleeding.  Problem # 2:  PREOPERATIVE EXAMINATION (ICD-V72.84) after resolution of the spontaneous hemorrhage in his leg the patient can be cleared for surgery. I will monitor him for another month or so and reassess his leg. In the meanwhile Coumadin can be continued. We will keep his INR around 2-2.5.  Problem # 3:  DEEP VENOUS THROMBOPHLEBITIS, RECURRENT (ICD-453.40) the patient will need bridging with Lovenox at the time of his total knee replacement  given his history of recurrent DVT. The patient may well have a hypercoagulable state but there is no family history of DVT at this point the patient needs to remain on Coumadin regardless as well as for a history of atrial fibrillation. Given the absence of a significant family history I do not see any reason to do a hypercoagulable workup.  Problem # 4:  ATRIAL FIBRILLATION (ICD-427.31) we briefly discussed the possible use of dabigatran in the future, but I think this would be a bad choice given the fact that this drug is not easily reversible, will require b.i.d. dosing and would incur a higher  expense.patient's Coumadin will be monitored more frequently every 2 weeks in the meanwhile His updated medication list for this problem includes:    Warfarin Sodium 5 Mg Tabs (Warfarin sodium) .Marland Kitchen... Take as directed by coumadin clinic    Warfarin Sodium 7.5 Mg Tabs (Warfarin sodium) ..... Use as directed by anticoagulation clinic    Aspir-low 81 Mg Tbec (Aspirin) .Marland Kitchen... Take 1 tablet by mouth every other day  Orders: EKG w/ Interpretation (93000) Protime INR (02725)  Problem # 5:  HYPERTENSION, UNSPECIFIED (ICD-401.9) Assessment: Comment Only  His updated medication list for this problem includes:    Lisinopril-hydrochlorothiazide 20-12.5 Mg Tabs (Lisinopril-hydrochlorothiazide) .Marland Kitchen... Take 1 tablet by mouth once a day    Aspir-low 81 Mg Tbec (Aspirin) .Marland Kitchen... Take 1 tablet by mouth every other day  Patient Instructions: 1)  Your physician recommends that you continue on your current medications as directed. Please refer to the Current Medication list given to you today. 2)  Follow up in  4-6 weeks.  Anticoagulant Therapy  Managed by: Edrick Oh, RN Referring MD: Terald Sleeper PCP: Randa Spike MD: Domenic Polite MD, Mikeal Hawthorne Indication 1: Deep Vein Thrombosis - Leg (ICD-451.1) Lab Used: [place] Site: [facility] INR POC 2.4  Vital Signs: Weight: 223 lbs.  Pulse Rate: 80  Blood Pressure:  109 / 67   Dietary changes: no    Health status changes: no    Bleeding/hemorrhagic complications: no    Recent/future hospitalizations: no    Any changes in medication regimen? no    Recent/future dental: no  Any missed doses?: no       Is patient compliant with meds? yes      Comments: discoloration area of right leg since 24th December 2011 had doppler/ultra sound at [facility]

## 2010-04-01 NOTE — Medication Information (Signed)
Summary: Coumadin Clinic  Anticoagulant Therapy  Managed by: Lovina Reach, LPN Referring MD: Terald Sleeper PCP: Randa Spike MD: Dannielle Burn MD, Luvenia Heller Indication 1: Deep Vein Thrombosis - Leg (ICD-451.1) Lab Used: [place] Site: [facility] INR POC 2.6  Dietary changes: no    Health status changes: no    Bleeding/hemorrhagic complications: no    Recent/future hospitalizations: no    Any changes in medication regimen? no    Recent/future dental: no  Any missed doses?: no       Is patient compliant with meds? yes       Allergies: No Known Drug Allergies  Anticoagulation Management History:      The patient is taking warfarin and comes in today for a routine follow up visit.  Positive risk factors for bleeding include an age of 75 years or older.  The bleeding index is 'intermediate risk'.  Positive CHADS2 values include History of HTN and Age > 75 years old.  The start date was 08/11/2006.  Anticoagulation responsible provider: Dannielle Burn MD, Luvenia Heller.  INR POC: 2.4.  Cuvette Lot#: [id number].  Exp: 10/11.    Anticoagulation Management Assessment/Plan:      The patient's current anticoagulation dose is Warfarin sodium 5 mg tabs: take as directed by coumadin clinic, Warfarin sodium 7.5 mg tabs: Use as directed by Anticoagulation Clinic.  The target INR is 2 - 3.  The next INR is due 03/26/2010.  Anticoagulation instructions were given to patient.  Results were reviewed/authorized by Lovina Reach, LPN.  He was notified by Lovina Reach, LPN.         Prior Anticoagulation Instructions: INR 3.8 Pending knee surgery.  On oxycodone Hold coumadin tonight then resume 7.5mg  once daily except 5mg  on Mondays and Fridays  Current Anticoagulation Instructions: INR 2.6 Continue coumadin 7.5mg  once daily except 5mg  on Mondays and Fridays

## 2010-04-01 NOTE — Progress Notes (Signed)
Summary: okay for physical therapy  Phone Note Call from Patient   Summary of Call: Dr. Hart Robinsons wanted pt to start physical therapy on right knee.  Seen him last on 1/16 & MD pulled some blood out.  First time that he had been able to get anything out.  Knee area swollen & does not have full movement.  Is due to see GD on 2/9 & will also see surgeon again sometime in February to decide on surgical date.  Uncomfortable about starting without your approval.  Initial call taken by: Lovina Reach, LPN,  January 24, [id number] 11:41 AM  Follow-up for Phone Call        it would be fine to start physical therapy. Follow-up by: Terald Sleeper, MD, [facility],  March 26, 2010 10:36 AM  Additional Follow-up for Phone Call Additional follow up Details #1::        Patient notified.    Additional Follow-up by: Lovina Reach, LPN,  January 27, [id number] 4:12 PM

## 2010-04-01 NOTE — Medication Information (Signed)
Summary: ccr-lr  Anticoagulant Therapy  Managed by: Edrick Oh, RN Referring MD: Terald Sleeper PCP: Randa Spike MD: Dannielle Burn MD, Luvenia Heller Indication 1: Deep Vein Thrombosis - Leg (ICD-451.1) Lab Used: [place] Site: [facility] INR POC 2.0  Dietary changes: no    Health status changes: no    Bleeding/hemorrhagic complications: no    Recent/future hospitalizations: no    Any changes in medication regimen? no    Recent/future dental: no  Any missed doses?: no       Is patient compliant with meds? yes       Allergies: No Known Drug Allergies  Anticoagulation Management History:      The patient is taking warfarin and comes in today for a routine follow up visit.  Positive risk factors for bleeding include an age of 75 years or older.  The bleeding index is 'intermediate risk'.  Positive CHADS2 values include History of HTN and Age > 75 years old.  The start date was 08/11/2006.  Anticoagulation responsible provider: Dannielle Burn MD, Luvenia Heller.  INR POC: 2.0.  Cuvette Lot#: [id number].  Exp: 10/11.    Anticoagulation Management Assessment/Plan:      The patient's current anticoagulation dose is Warfarin sodium 5 mg tabs: take as directed by coumadin clinic, Warfarin sodium 7.5 mg tabs: Use as directed by Anticoagulation Clinic.  The target INR is 2 - 3.  The next INR is due 04/08/2010.  Anticoagulation instructions were given to patient.  Results were reviewed/authorized by Edrick Oh, RN.  He was notified by Edrick Oh RN.         Prior Anticoagulation Instructions: INR 2.6 Continue coumadin 7.5mg  once daily except 5mg  on Mondays and Fridays  Current Anticoagulation Instructions: INR 2.0 Take coumadin 7.5mg  tonight then take 7.5mg  once daily except 5mg  on M,W,F till finished with antibiotic then resume 7.5mg  once daily except 5mg  on M,F Next INR check 04/08/10 at [facility] appt

## 2010-04-08 ENCOUNTER — Encounter: Payer: Self-pay | Admitting: Cardiology

## 2010-04-08 ENCOUNTER — Ambulatory Visit (INDEPENDENT_AMBULATORY_CARE_PROVIDER_SITE_OTHER): Payer: Medicare Other | Admitting: Cardiology

## 2010-04-08 DIAGNOSIS — I4891 Unspecified atrial fibrillation: Secondary | ICD-10-CM

## 2010-04-08 DIAGNOSIS — R609 Edema, unspecified: Secondary | ICD-10-CM

## 2010-04-09 ENCOUNTER — Encounter: Payer: Self-pay | Admitting: Cardiology

## 2010-04-15 NOTE — Assessment & Plan Note (Signed)
Summary: F1M-AGH/SRS   Visit Type:  Follow-up Primary Provider:  Quillian Quince   History of Present Illness: the patient is an 77 year old male with a history of recurrent DVTs and possible hypercoagulable state.  He has a history of atrial fibrillation with a high CHA2DS2-Vasc score.  He also has diabetes mellitus hypertension and dyslipidemia.  He had a normal adenosine stress study in October 2008.  During the last office visit the patient was referred for a right total knee replacement the perioperative assessment.  Prior to this visit however he had recently suffered a spontaneous hemorrhage in the right thigh  the patient did not recall any trauma.  His INR was greater than 3.  He was also ruled out for DVT and had negative Dopplers.  A Baker's cyst was seen in the event of aspiration was made.  The patient presents for follow-up. the hematoma is for a large part resolved.  However the patient states that he doesn't feel ready for an operation yet and wants to hold a little longer.The patient had no further bleeding. His electrocardiogram was reviewed in the office today. There were no acute changes.   Preventive Screening-Counseling & Management  Alcohol-Tobacco     Smoking Status: quit     Year Quit: 1957  Current Medications (verified): 1)  Warfarin Sodium 5 Mg Tabs (Warfarin Sodium) .... Take As Directed By Coumadin Clinic 2)  Warfarin Sodium 7.5 Mg Tabs (Warfarin Sodium) .... Use As Directed By Anticoagulation Clinic 3)  Glipizide 10 Mg Tabs (Glipizide) .... Take 1 Tablet By Mouth Once A Day 4)  Lisinopril-Hydrochlorothiazide 20-12.5 Mg Tabs (Lisinopril-Hydrochlorothiazide) .... Take 1&1/2 Tablet By Mouth Once A Day 5)  Simvastatin 20 Mg Tabs (Simvastatin) .... Take 1 Tablet By Mouth Once A Day 6)  Omeprazole 40 Mg Cpdr (Omeprazole) .... Take 1 Tablet By Mouth Two Times A Day 7)  Preservision Areds 2  Caps (Multiple Vitamins-Minerals) .... Take 1 Tablet By Mouth Two Times A Day 8)   Venastat 300 Mg Cr-Caps ([place]) .... Take 1 Tablet By Mouth Once A Day  Allergies (verified): No Known Drug Allergies  Comments:  Nurse/Medical Assistant: The patient's medication list and allergies were reviewed with the patient and were updated in the Medication and Allergy Lists.  Past History:  Past Medical History: Last updated: 03/08/2010 HYPERTENSION, UNSPECIFIED (ICD-401.9) HYPERLIPIDEMIA-MIXED (ICD-272.4) ATRIAL FIBRILLATION (ICD-427.31) Type 2 diabetes mellitus Recurrent lower extremity DVT History of paroxysmal atrial fibrillation during exercise testing December 2000 CHADS2 score is 3 Spontaneous hemorrhage of right thigh with possible over anticoagulation  Past Surgical History: Last updated: 12/15/2008 Cholecystectomy Rotator Cuff Repair  Family History: Last updated: 04/08/2010 No significant family history   Social History: Last updated: 12/15/2008 Retired  Married  Tobacco Use - Yes.  Alcohol Use - no Regular Exercise - yes Drug Use - no  Risk Factors: Smoking Status: quit (04/08/2010)  Family History: No significant family history   Review of Systems       The patient complains of leg swelling.  The patient denies fatigue, malaise, fever, weight gain/loss, vision loss, decreased hearing, hoarseness, chest pain, palpitations, shortness of breath, prolonged cough, wheezing, sleep apnea, coughing up blood, abdominal pain, blood in stool, nausea, vomiting, diarrhea, heartburn, incontinence, blood in urine, muscle weakness, joint pain, rash, skin lesions, headache, fainting, dizziness, depression, anxiety, enlarged lymph nodes, easy bruising or bleeding, and environmental allergies.    Vital Signs:  Patient profile:   77 year old male Height:  70 inches Weight:      226 pounds Pulse rate:   92 / minute BP sitting:   142 / 62  (left arm) Cuff size:   large  Vitals Entered By: Georgina Peer (April 08, 2010 1:30 PM)  Physical  Exam  Additional Exam:  General: Well-developed, well-nourished in no distress head: Normocephalic and atraumatic eyes PERRLA/EOMI intact, conjunctiva and lids normal nose: No deformity or lesions mouth normal dentition, normal posterior pharynx neck: Supple, no JVD.  No masses, thyromegaly or abnormal cervical nodes lungs: Normal breath sounds bilaterally without wheezing.  Normal percussion heart: regular rate and rhythm with normal S1 and S2, no S3 or S4.  PMI is normal.  No pathological murmurs abdomen: Normal bowel sounds, abdomen is soft and nontender without masses, organomegaly or hernias noted.  No hepatosplenomegaly musculoskeletal: Back normal, normal gait muscle strength and tone normal pulsus: Pulse is normal in all 4 extremities Extremities: No peripheral pitting edema, The hematoma on his leg has resolved. neurologic: Alert and oriented x 3 skin: Intact without lesions or rashes cervical nodes: No significant adenopathy psychologic: Normal affect    EKG  Procedure date:  04/08/2010  Findings:      EKG with normal sinus rhythm. Low voltage q.r.s.Marland Kitchen Otherwise no acute abnormalities. Heart rate is 97 beats per minute   Impression & Recommendations:  Problem # 1:  DEEP VENOUS THROMBOPHLEBITIS, RECURRENT (ICD-453.40) The patient is on long term anticoagulation. He has  had no further complications. Coumadin will be continued.  Orders: Protime INR (28413)  Problem # 2:  ATRIAL FIBRILLATION (ICD-427.31) Patient has no evidence of recurrent a to fibrillation.  The following medications were removed from the medication list:    Aspir-low 81 Mg Tbec (Aspirin) .Marland Kitchen... Take 1 tablet by mouth every other day His updated medication list for this problem includes:    Warfarin Sodium 5 Mg Tabs (Warfarin sodium) .Marland Kitchen... Take as directed by coumadin clinic    Warfarin Sodium 7.5 Mg Tabs (Warfarin sodium) ..... Use as directed by anticoagulation clinic  Orders: EKG w/  Interpretation (93000) Protime INR (24401)  Problem # 3:  PREOPERATIVE EXAMINATION (ICD-V72.84) Patient wants to hold off on surgery little bit longer. The patient stated he will call back when he Wants  to precede with surgery .   Patient Instructions: 1)  Your physician recommends that you continue on your current medications as directed. Please refer to the Current Medication list given to you today. 2)  Follow up in  6 months  Anticoagulant Therapy  Managed by: Edrick Oh, RN Referring MD: Terald Sleeper PCP: Randa Spike MD: Dannielle Burn MD, Luvenia Heller Indication 1: Deep Vein Thrombosis - Leg (ICD-451.1) Lab Used: [place] Site: [facility] INR POC 2.9  Vital Signs: Weight: 226 lbs.  Pulse Rate: 92  Blood Pressure:  142 / 62   Dietary changes: no    Health status changes: no    Bleeding/hemorrhagic complications: no    Recent/future hospitalizations: no    Any changes in medication regimen? no    Recent/future dental: no  Any missed doses?: no       Is patient compliant with meds? yes

## 2010-04-21 NOTE — Medication Information (Signed)
Summary: Coumadin Clinic  Anticoagulant Therapy  Managed by: Lovina Reach, LPN Referring MD: Terald Sleeper PCP: Randa Spike MD: Dannielle Burn MD, Luvenia Heller Indication 1: Deep Vein Thrombosis - Leg (ICD-451.1) Lab Used: [place] Site: [facility] INR POC 2.9  Dietary changes: no    Health status changes: no    Bleeding/hemorrhagic complications: no    Recent/future hospitalizations: no    Any changes in medication regimen? no    Recent/future dental: no  Any missed doses?: no       Is patient compliant with meds? yes       Allergies: No Known Drug Allergies  Anticoagulation Management History:      The patient is taking warfarin and comes in today for a routine follow up visit.  Positive risk factors for bleeding include an age of 75 years or older.  The bleeding index is 'intermediate risk'.  Positive CHADS2 values include History of HTN and Age > 48 years old.  The start date was 08/11/2006.  Anticoagulation responsible provider: Dannielle Burn MD, Luvenia Heller.  INR POC: 2.9.  Cuvette Lot#: [id number].  Exp: 10/11.    Anticoagulation Management Assessment/Plan:      The patient's current anticoagulation dose is Warfarin sodium 5 mg tabs: take as directed by coumadin clinic, Warfarin sodium 7.5 mg tabs: Use as directed by Anticoagulation Clinic.  The target INR is 2 - 3.  The next INR is due 04/23/2010.  Anticoagulation instructions were given to patient.  Results were reviewed/authorized by Lovina Reach, LPN.  He was notified by Lovina Reach, LPN.         Prior Anticoagulation Instructions: INR 2.0 Take coumadin 7.5mg  tonight then take 7.5mg  once daily except 5mg  on M,W,F till finished with antibiotic then resume 7.5mg  once daily except 5mg  on M,F Next INR check 04/08/10 at [facility] appt  Current Anticoagulation Instructions: INR 2.9 Continue coumadin 7.5mg  once daily except 5mg  on Mondays and Fridays

## 2010-04-27 ENCOUNTER — Encounter: Payer: Self-pay | Admitting: Cardiology

## 2010-04-27 ENCOUNTER — Encounter (INDEPENDENT_AMBULATORY_CARE_PROVIDER_SITE_OTHER): Payer: Medicare Other

## 2010-04-27 DIAGNOSIS — I80299 Phlebitis and thrombophlebitis of other deep vessels of unspecified lower extremity: Secondary | ICD-10-CM

## 2010-04-27 DIAGNOSIS — Z7901 Long term (current) use of anticoagulants: Secondary | ICD-10-CM

## 2010-04-27 LAB — CONVERTED CEMR LAB: POC INR: 1.7

## 2010-05-06 NOTE — Medication Information (Signed)
Summary: Don Hall, last check 2/9 (dose adjusted by gd)  --agh  Anticoagulant Therapy  Managed by: Edrick Oh, RN Referring MD: Terald Sleeper PCP: Randa Spike MD: Domenic Polite MD, Mikeal Hawthorne Indication 1: Deep Vein Thrombosis - Leg (ICD-451.1) Lab Used: [place] Site: [facility] INR POC 1.7  Dietary changes: no    Health status changes: no    Bleeding/hemorrhagic complications: no    Recent/future hospitalizations: no    Any changes in medication regimen? no    Recent/future dental: no  Any missed doses?: no       Is patient compliant with meds? yes       Allergies: No Known Drug Allergies  Anticoagulation Management History:      The patient is taking warfarin and comes in today for a routine follow up visit.  Positive risk factors for bleeding include an age of 75 years or older.  The bleeding index is 'intermediate risk'.  Positive CHADS2 values include History of HTN and Age > 11 years old.  The start date was 08/11/2006.  Anticoagulation responsible provider: Domenic Polite MD, Mikeal Hawthorne.  INR POC: 1.7.  Cuvette Lot#: [id number].  Exp: 10/11.    Anticoagulation Management Assessment/Plan:      The patient's current anticoagulation dose is Warfarin sodium 5 mg tabs: take as directed by coumadin clinic, Warfarin sodium 7.5 mg tabs: Use as directed by Anticoagulation Clinic.  The target INR is 2 - 3.  The next INR is due 05/11/2010.  Anticoagulation instructions were given to patient.  Results were reviewed/authorized by Edrick Oh, RN.  He was notified by Edrick Oh RN.         Prior Anticoagulation Instructions: INR 2.9 Continue coumadin 7.5mg  once daily except 5mg  on Mondays and Fridays  Current Anticoagulation Instructions: INR 1.7 Take coumadin 7.5mg  tonight then increase dose to 7.5mg  once daily except 5mg  on S,T,Th

## 2010-05-10 LAB — DIFFERENTIAL
Eosinophils Absolute: 0.1 10*3/uL (ref 0.0–0.7)
Eosinophils Relative: 1 % (ref 0–5)
Lymphocytes Relative: 6 % — ABNORMAL LOW (ref 12–46)
Lymphs Abs: 0.8 10*3/uL (ref 0.7–4.0)
Monocytes Relative: 6 % (ref 3–12)

## 2010-05-10 LAB — CBC
Hemoglobin: 12.5 g/dL — ABNORMAL LOW (ref 13.0–17.0)
MCH: 30.6 pg (ref 26.0–34.0)
MCV: 92.2 fL (ref 78.0–100.0)
Platelets: 203 10*3/uL (ref 150–400)
RBC: 4.08 MIL/uL — ABNORMAL LOW (ref 4.22–5.81)
WBC: 13.6 10*3/uL — ABNORMAL HIGH (ref 4.0–10.5)

## 2010-05-10 LAB — PROTIME-INR
INR: 3.47 — ABNORMAL HIGH (ref 0.00–1.49)
Prothrombin Time: 34.9 seconds — ABNORMAL HIGH (ref 11.6–15.2)

## 2010-05-10 LAB — BASIC METABOLIC PANEL
CO2: 20 mEq/L (ref 19–32)
Chloride: 106 mEq/L (ref 96–112)
GFR calc Af Amer: 43 mL/min — ABNORMAL LOW (ref >60)
Potassium: 4.5 mEq/L (ref 3.5–5.1)
Sodium: 136 mEq/L (ref 135–145)

## 2010-05-11 ENCOUNTER — Encounter (INDEPENDENT_AMBULATORY_CARE_PROVIDER_SITE_OTHER): Payer: Medicare Other

## 2010-05-11 ENCOUNTER — Encounter: Payer: Self-pay | Admitting: Cardiology

## 2010-05-11 DIAGNOSIS — Z7901 Long term (current) use of anticoagulants: Secondary | ICD-10-CM

## 2010-05-11 DIAGNOSIS — I80299 Phlebitis and thrombophlebitis of other deep vessels of unspecified lower extremity: Secondary | ICD-10-CM

## 2010-05-18 NOTE — Medication Information (Signed)
Summary: ccr-lr  Anticoagulant Therapy  Managed by: Edrick Oh, RN Referring MD: Terald Sleeper PCP: Randa Spike MD: Percival Spanish MD, Jeneen Rinks Indication 1: Deep Vein Thrombosis - Leg (ICD-451.1) Lab Used: [place] Site: [facility] INR POC 3.4  Dietary changes: no    Health status changes: no    Bleeding/hemorrhagic complications: no    Recent/future hospitalizations: no    Any changes in medication regimen? no    Recent/future dental: no  Any missed doses?: no       Is patient compliant with meds? yes       Allergies: No Known Drug Allergies  Anticoagulation Management History:      The patient is taking warfarin and comes in today for a routine follow up visit.  Positive risk factors for bleeding include an age of 75 years or older.  The bleeding index is 'intermediate risk'.  Positive CHADS2 values include History of HTN and Age > 32 years old.  The start date was 08/11/2006.  Anticoagulation responsible provider: Percival Spanish MD, Jeneen Rinks.  INR POC: 3.4.  Cuvette Lot#: [id number].  Exp: 10/11.    Anticoagulation Management Assessment/Plan:      The patient's current anticoagulation dose is Warfarin sodium 5 mg tabs: take as directed by coumadin clinic, Warfarin sodium 7.5 mg tabs: Use as directed by Anticoagulation Clinic.  The target INR is 2 - 3.  The next INR is due 06/08/2010.  Anticoagulation instructions were given to patient.  Results were reviewed/authorized by Edrick Oh, RN.  He was notified by Edrick Oh RN.         Prior Anticoagulation Instructions: INR 1.7 Take coumadin 7.5mg  tonight then increase dose to 7.5mg  once daily except 5mg  on S,T,Th  Current Anticoagulation Instructions: INR 3.4 Take coumadin 2.5mg  tonight then decrease dose to 5mg  once daily except 7.5mg  on M,W,F

## 2010-06-07 ENCOUNTER — Encounter: Payer: Self-pay | Admitting: Cardiology

## 2010-06-07 DIAGNOSIS — I4891 Unspecified atrial fibrillation: Secondary | ICD-10-CM

## 2010-06-07 DIAGNOSIS — Z7901 Long term (current) use of anticoagulants: Secondary | ICD-10-CM

## 2010-06-07 DIAGNOSIS — I82409 Acute embolism and thrombosis of unspecified deep veins of unspecified lower extremity: Secondary | ICD-10-CM

## 2010-06-08 ENCOUNTER — Ambulatory Visit (INDEPENDENT_AMBULATORY_CARE_PROVIDER_SITE_OTHER): Payer: Medicare Other | Admitting: *Deleted

## 2010-06-08 DIAGNOSIS — I4891 Unspecified atrial fibrillation: Secondary | ICD-10-CM

## 2010-06-08 DIAGNOSIS — I82409 Acute embolism and thrombosis of unspecified deep veins of unspecified lower extremity: Secondary | ICD-10-CM

## 2010-06-08 DIAGNOSIS — Z7901 Long term (current) use of anticoagulants: Secondary | ICD-10-CM

## 2010-06-08 LAB — POCT INR: INR: 3.5

## 2010-06-29 ENCOUNTER — Ambulatory Visit (INDEPENDENT_AMBULATORY_CARE_PROVIDER_SITE_OTHER): Payer: Medicare Other | Admitting: *Deleted

## 2010-06-29 DIAGNOSIS — I82409 Acute embolism and thrombosis of unspecified deep veins of unspecified lower extremity: Secondary | ICD-10-CM

## 2010-06-29 DIAGNOSIS — Z7901 Long term (current) use of anticoagulants: Secondary | ICD-10-CM

## 2010-06-29 DIAGNOSIS — I4891 Unspecified atrial fibrillation: Secondary | ICD-10-CM

## 2010-06-29 LAB — POCT INR: INR: 2.4

## 2010-07-13 NOTE — Assessment & Plan Note (Signed)
[place] OFFICE NOTE   NAME:Don [name], Don [name]                     MRN:          [id number]  DATE:08/14/2008                            DOB:          1927-07-26    REFERRING PHYSICIAN:  [name]   HISTORY OF PRESENT ILLNESS:  The patient is a very pleasant 77 year old  male with a history of recurrent DVT.  He also has been treated recently  for [facility].  He had arterial ABIs done  which were within normal limits.  He reports to me that the wound is  healing.  He denies any substernal chest pain or shortness of breath.  He has significant cardiac risk factors, but had negative stress test in  2008.  The patient also has type 2 diabetes mellitus and hypertension.  He was slightly hypertensive in the office today, but on repeat blood  pressure was within normal limits.  He denies any palpitations,  orthopnea, or PND.   MEDICATIONS:  1. Warfarin as directed.  2. Glipizide 10 mg 1 q.a.m.  3. Lisinopril/HCTZ 20/12.5 one and a half daily.  4. Simvastatin 20 mg p.o. daily.  5. Aspirin 81 mg p.o. daily.  6. Glucosamine 500 mg p.o. daily.  7. Omega-3 fish oil.  8. Omeprazole 40 mg p.o. b.i.d.  9. Vitamin D 1000 mg p.o. daily.   PHYSICAL EXAMINATION:  VITAL SIGNS:  Blood pressure 147/72, heart rate  66, weight is 219 pounds.  NECK:  Normal carotid upstroke.  No carotid bruits.  HEENT:  Pupils, eyes clear; conjunctivae clear.  LUNGS:  Clear breath sounds bilaterally.  HEART:  Regular rate and rhythm.  Normal S1 and S2.  No pathological  murmurs.  ABDOMEN:  Soft, nontender.  No rebound or guarding.  Good bowel sounds.  EXTREMITIES:  No cyanosis, clubbing, or edema.  NEURO:  The patient is alert, oriented, and grossly nonfocal.   PROBLEM LIST:  1. Recurrent lower extremity deep venous thrombosis, stable.  2. Chronic Coumadin secondary to recurrent lower extremity deep venous    thrombosis, stable.  3. Multiple cardiac risk factor.      a.     Normal adenosine stress cardiac study, ejection fraction       59%, October 2008.  4. History of paroxysmal atrial fibrillation.      a.     During exercise testing, December 2000.      b.     Mali score 3.  5. Type 2 diabetes mellitus.  6. Hypertension.  7. Dyslipidemia.  8. Easy bruisability.   PLAN:  1. We also gave the patient refill for his Coumadin.  We will give him      a 90 days supply 6 refills.  2. From a cardiac standpoint, he is stable.  He does not need any      stress testing at the present time.  3. The patient's blood pressure was rechecked and was within normal      limits.  He can follow up with [place] in 6 months.     Luvenia Heller  Gerlene Burdock, MD,FACC  Electronically Signed    GED/MedQ  DD: 08/14/2008  DT: 08/14/2008  Job #: [id number]   cc:   [name]

## 2010-07-13 NOTE — Consult Note (Signed)
NAME:  Don [name], Don [name] NO.:  [id number]   MEDICAL RECORD NO.:  [id number]          PATIENT TYPE:  INP   LOCATION:  1619                         FACILITY[facility]  [facility]   PHYSICIAN:  Barbette Merino, M.D.      DATE OF BIRTH:  1927/09/25   DATE OF CONSULTATION:  03/16/2007  DATE OF DISCHARGE:                                 CONSULTATION   PRIMARY CARE PHYSICIAN:  Dr. [name] from out of town.   REASON FOR CONSULTATION:  Diabetes, hypertension management.  Consult  requested for by Dr. Sydnee Cabal.   HISTORY OF PRESENT ILLNESS:  The patient is a 77 year old gentleman  admitted today for left total knee replacement.  He has longstanding  osteoarthritis of the left knee.  He has had outpatient followup and  treatment.  He was cleared for surgery by both Dr. Terald Sleeper of [facility]  Cardiology and his primary care physician.  The patient has no specific  complaints.  He was doing fine and his surgery went well.  However, he  has chronic medical problems that require close monitoring while in the  hospital.  We are consulted for further management.  At this point the  patient is doing fine post surgery and has no specific complaint.   PAST MEDICAL HISTORY:  Significant for diabetes type 2, hypertension,  dyslipidemia, GERD, history of right lower extremity DVT in June 2008,  history of paroxysmal atrial fibrillation and chronic kidney disease.   ALLERGIES:  The patient has no noted drug allergies.   MEDICATIONS:  1. Beta carotene 1 tablet daily.  2. Cholecalciferol 100 units daily.  3. Colace 100 mg b.i.d.  4. Fentanyl patch 100 mcg 1x.  5. Ferrous sulfate 325 mg daily.  6. Glipizide 10 mg daily.  7. Hydrochlorothiazide 12.5 mg daily.  8. Dilaudid for pain.  9. Lisinopril 20 mg daily.  10.Zofran 20 mg daily.  11.Phenergan p.r.n.  12.Protonix 30 mg b.i.d.   SOCIAL HISTORY:  The patient lives with his wife.  No tobacco or alcohol  use.  No IV drug use.   FAMILY  HISTORY:  Noncontributory.   REVIEW OF SYSTEMS:  12 point review of systems is negative except per  HPI.   PHYSICAL EXAMINATION:  VITAL SIGNS:  The patient is afebrile with  temperature 97, blood pressure 133/44, pulse 81, respiratory rate 16,  sats 94% on 2 liters.  GENERAL:  He is an awake, alert, oriented, pleasant man in no acute  distress.  HEENT:  PERRL.  EOMI.  NECK:  Supple.  No JVD, no lymphadenopathy.  RESPIRATORY:  Has good air entry bilaterally.  No wheezes or rales.  CARDIOVASCULAR:  The patient has S1-S2, no murmurs.  ABDOMEN:  Abdomen is soft, nontender with positive bowel sounds.  EXTREMITIES:  Left lower extremity status post total knee replacement.  Swollen knee postoperatively wrapped up.  Right lower extremity no  edema, cyanosis or clubbing.   LABS:  Are currently pending.   ASSESSMENT:  This is a 77 year old status post left total knee  replacement for osteoarthritis.  The patient is currently stable.  Other  medical problems include diabetes, hypertension, dyslipidemia, GERD  (gastroesophageal reflux disease), history of DVT (deep venous  thrombosis), history of PAF (paroxysmal atrial fibrillation) and chronic  kidney disease.   RECOMMENDATIONS:  1. Will continue the patient's home medications at this point.  2. Check hemoglobin A1c, fasting lipid panel as well as basic labs.      Apparently the patient's A1c was 6.1 late last year and his fasting      lipid panel was okay.  Continue with proton pump inhibitor.  The      patient requires DVT prophylaxis as soon as possible.   We thank you for the consult.  We will follow up with you and further  treatment will depend on the patient's initial response.      Barbette Merino, M.D.  Electronically Signed     LG/MEDQ  D:  03/16/2007  T:  03/17/2007  Job:  [id number]

## 2010-07-13 NOTE — Discharge Summary (Signed)
NAME:  Don [name], Don [name] NO.:  [id number]   MEDICAL RECORD NO.:  [id number]          PATIENT TYPE:  INP   LOCATION:  [id number]                         [facility]ACILITY:  [facility]   PHYSICIAN:  Cynda Familia, M.D.DATE OF BIRTH:  03-09-1927   DATE OF ADMISSION:  03/16/2007  DATE OF DISCHARGE:  03/20/2007                               DISCHARGE SUMMARY   ADMISSION DIAGNOSES:  1. End-stage osteoarthritis left knee.  2. Diabetes.  3. Hypertension.  4. Renal insufficiency.   DISCHARGE DIAGNOSES:  1. End-stage osteoarthritis left knee.  2. Diabetes.  3. Hypertension.  4. Renal insufficiency.   OPERATION:  Total knee arthroplasty left knee.   BRIEF HISTORY:  A 77 year old gentleman with history of end-stage  osteoarthritis of his left knee who has failed conservative management.  He had been cleared by Dr. Doreatha Lew of [facility] and his primary  care physician for surgery and after discussion of treatment with  benefits, risks and options, the patient now scheduled for total knee  arthroplasty.  He does have history of a DVT of his right calf and  postoperatively will be on Lovenox 30 mg b.i.d. for total of 3 weeks.  He has this medication at home that he had pre-filled before surgery.   LABORATORY VALUES:  Admission CBC was within normal limits.  Admission  PT/PTT within normal limits.  Admission C-met showed BUN high at 30,  creatinine high at 1.61.  Urinalysis within normal limits.  The  hemoglobin and hematocrit reached a low of 8.8 and 25.6 on the 19th.  His BUN and creatinine remained mildly elevated throughout admission. On  the 19th it was down to 25 and 1.65 which is about stable for him.  Hemoglobin A1c 5.9.   COURSE IN THE HOSPITAL:  The patient tolerated the operative procedure  well.  Medical consult was obtained with [facility] hospitalist for  maintenance of his diabetes, hypertension and renal insufficiency.  The  first postoperative day vital signs  were stable.  He was afebrile.  His  drain was removed without difficulty.  Lungs were clear.  Heart sounds  normal.  Calves were negative.  Foley catheter was kept in to monitor  his urine output with his history of renal insufficiency.  The second  postoperative day vital signs were stable.  He was afebrile.  Urine  output was good.  His dressing was changed.  His wound was benign.  His  calves were negative.  He did well on physical therapy.  Third  postoperative day he was awake and alert, he was ambulating the hall  without difficulty.  Vital signs were stable.  He was afebrile.  Dressing was changed.  His wound was benign.  His calves were negative  and plans were made for discharge on the 20th.  On 03/20/07 with vital  signs stable, afebrile, lungs clear, heart sounds normal.  Bowel sounds  active.  Calves negative.  Wound benign.  The patient is subsequently  discharged home for follow up in the office.   CONDITION ON DISCHARGE:  Improved.   DISCHARGE MEDICATIONS:  1. Percocet 5/325 1-2  q. 6 hours p.r.n. pain.  2. Robaxin 500 one q. 8 hours p.r.n. spasm.  3. _________ one b.i.d. for anemia.  4. Laxative of choice.  5. Lovenox 30 mg subcu b.i.d. for a total of 3 weeks postop.   DISCHARGE INSTRUCTIONS:  He is instructed to keep the wound clean and  dry, to use his walker, do his home physical therapy.  Hold his aspirin  until he is done with the Lovenox and then asked to resume his aspirin  at that time.  Call the office today for follow-up in 2 weeks or call  sooner p.r.n. problems.  Discharge diet regular, low carbohydrate diet.      Judith Part. Chabon, P.A.    ______________________________  Cynda Familia, M.D.    SJC/MEDQ  D:  03/20/2007  T:  03/20/2007  Job:  [id number]

## 2010-07-13 NOTE — Assessment & Plan Note (Signed)
[place] OFFICE NOTE   NAME:Don [name], Don [name]                     MRN:          [id number]  DATE:08/21/2006                            DOB:          [birth year]    PRIMARY CARE PHYSICIAN:  [name], M.D.   CARDIOLOGIST:  He will be new to Dr. Dannielle Burn.   HISTORY OF PRESENT ILLNESS:  Don [name] is a 77 year old male patient  who returns today for evaluation of right lower extremity DVT.  This was  actually his initial appointment.  I saw him for an unscheduled visit on  August 11, 2006.  Please see that note for complete details.  At that  time, we were having a hard time getting his INR above 2, and he had run  out of his Lovenox.  Today, he denies any chest pain, shortness of  breath, syncope or near syncope.   CURRENT MEDICATIONS:  1. Warfarin as directed.  2. Glipizide 10 mg daily.  3. Lisinopril/HCTZ 20/2.5 mg daily.  4. Omeprazole 40 mg daily.  5. Simvastatin 20 mg daily.  6. PreserVision.  7. Omega 3 fish oil.  8. Aspirin 81 mg daily.  9. Glucosamine daily.   ALLERGIES:  No known drug allergies.   PHYSICAL EXAMINATION:  GENERAL:  He is a well-developed, well-nourished  male in no acute distress.  VITAL SIGNS:  Blood pressure 141/73, pulse 71, weight 221 pounds.  HEENT:  Normal.  NECK:  Without JVD.  CARDIAC:  Normal S1 and S2.  Regular rate and rhythm.  LUNGS:  Clear to auscultation bilaterally.  ABDOMEN:  Soft, nontender.  EXTREMITIES:  2+ edema on the right, 1+ edema on the left, with multiple  varicosities and gross scarring noted.   IMPRESSION:  1. Right lower extremity deep vein thrombosis.      a.     Location is in the gastrocnemius vein per records from [facility].  2. Coumadin therapy.  3. Diabetes mellitus.  4. Chronic renal insufficiency.  5. Hypertension.  6. Hyperlipidemia.  7. Gastroesophageal reflux disease.   PLAN:  The patient is doing  well.  He is now therapeutic with his  Coumadin, is off Lovenox.  He will continue follow up with our Coumadin  clinic.  His DVT is below the knee.  This puts him at lower risk.  He  may indeed be able to come off of Coumadin after 3 months of therapy.  In contrast, he may need to complete 6 months of therapy.  Will set him  up to follow up in 3 months' time and make a decision at that point.      Richardson Dopp, PA-C  Electronically Signed      [name], MD,FACC  Electronically Signed   SW/MedQ  DD: 08/21/2006  DT: 08/21/2006  Job #: [id number]   cc:   [name]
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
## 2010-07-13 NOTE — Assessment & Plan Note (Signed)
[place] OFFICE NOTE   NAME:Don [name], Don [name]                     MRN:          [id number]  DATE:01/02/2007                            DOB:          1927/10/04    The patient is a pleasant, 77 year old white male.  We take care of his  wife, Don [name].  The patient has a history of right lower extremity DVT and  was treated with Coumadin.  This was diagnosed in June 2008, at [facility].  The patient is status post five months of  treatment and has no recurrent swelling.  He also had repeat Doppler  studies done on the lower extremity, which showed no DVT.  As part of  the screening test, he also had his Cardiolite done, which was within  normal limits with no ischemia.  The patient is doing well.  He denies  any shortness of breath, orthopnea, PND.  He has no palpitations or  syncope.  He has remained hemodynamically stable.   MEDICATIONS:  1. Warfarin 5 mg as directed.  2. Glipizide 10 mg q.a.m.  3. Lisinopril/hydrochlorothiazide 20/12.5 mg p.o. daily.  4. Omeprazole 40 mg p.o. daily.  5. Simvastatin 20 mg p.o. daily.  6. __________  7. Aspirin 81 mg a day.  8. Glucosamine.  9. Omega fish oil.   PHYSICAL EXAMINATION:  VITAL SIGNS:  Blood pressure 140/62, heart rate  64, weight 223 pounds.  NECK EXAM:  Normal carotid upstroke and no carotid bruits.  LUNGS:  Clear breath sounds bilaterally.  HEART:  Regular rate and rhythm, normal S1, S2.  No murmur, no gallops.  ABDOMEN:  Soft, nontender, no rebound or guarding.  Good bowel sounds.  EXTREMITY EXAM:  No cyanosis, clubbing or edema.   PROBLEM LIST:  1. Status post right lower extremity DVT.      a.     Status post Coumadin therapy, discontinued today.  2. Multiple cardiac risk factors.  Negative Cardiolite stress study.  3. PAF during stress test in 2005.  4. Type 2 diabetes mellitus.  5. Chronic renal insufficiency.  6.  Hypertension.  7. Dyslipidemia.  8. History of arthritis.   PLAN:  1. From a cardiovascular perspective, patient is stable.  I have made      no changes in medical regimen.  2. I did stop the patient's Coumadin, as he seems to be adequately      treated for a lower extremity DVT, which was isolated to below the      calf level.  3. The patient can follow up with [place] in one year.     [name], MD,FACC  Electronically Signed    GED/MedQ  DD: 01/02/2007  DT: 01/03/2007  Job #: [id number]   cc:   [name]

## 2010-07-13 NOTE — Assessment & Plan Note (Signed)
[facility] OFFICE NOTE   NAME:Don [name]                     MRN:          [id number]  DATE:11/23/2006                            DOB:          06/30/1927    PRIMARY CARDIOLOGIST:  Dr. Dannielle [name].   REASON FOR VISIT:  Scheduled three month follow up. Please refer to [facility] clinic note of June 23rd, for full details.   The patient presents with several cardiac risk factors, but no  documented history of coronary artery disease, and had a low-risk  adequate exercise stress Cardiolite study in December 2005, reviewed by  Dr. Domenic [name]. At that time, it was noted by Dr. Domenic [name] that serial  EKGs suggested paroxysmal atrial fibrillation during the study. When  queried today about any prior history of dysrhythmia, however, the  patient indicated that he is not aware of any, and denies any history of  tachy-palpitations. The ejection fraction at the time of the stress test  was 70%, with the suggestion of a persistent inferior defect suggestive  of scar versus diaphragmatic attentuation.   The patient is being followed in our clinic essentially for Coumadin  anticoagulation, following diagnosis of DVT of the right gastrocnemius  vein at [facility] in June of this year. Of note, his initial  evaluation here at [facility] ED, following injury to the right  leg, was negative with no evidence of DVT by duplex imaging on May 30th.  There was also no evidence of fracture to the lower extremity by x-ray  imaging.   The patient denies any history of chest pain or exertional dyspnea. He  has been limited in his mobility recently, however, by severe arthritis  of the left knee. In fact, he is in the process of deciding when to  undergo total knee replacement and has already been evaluated by Dr.  Hart [name] of [place]. In fact, he indicated to me  that he initially was  planning on having it done this November, before  he developed the DVT and was placed on Coumadin.   CURRENT MEDICATIONS:  1. Coumadin as directed.  2. Glipizide 10 mg q.a.m.  3. Lisinopril/hydrochlorothiazide 20/12.5 mg daily.  4. Omeprazole 40 mg daily.  5. Simvastatin 20 mg daily.  6. Aspirin 81 mg daily.  7. Glucosamine 1500 mg daily.  8. Omega 3 fish oil b.i.d.   PHYSICAL EXAMINATION:  VITAL SIGNS:  Blood pressure 140/71, pulse 86 and  regular, weight 223 pounds.  GENERAL:  This is a well developed, well nourished 77 year old male  sitting upright in no distress.  HEENT:  Normocephalic atraumatic.  NECK:  Palpable bilateral carotid pulses without bruits; no JVD.  LUNGS:  Clear to auscultation in all fields.  HEART:  Regular rate and rhythm (S1, S2). No significant murmur.  Occasional premature beat.  ABDOMEN:  Soft and nontender.  EXTREMITIES:  2+ bilateral non-pitting edema with significant venous  varicosity on the left and evidence of venous stasis dermatitis.  Palpable bilateral dorsalis pedis pulses.  NEUROLOGIC:  No focal deficit.   IMPRESSION:  1. Right  lower extremity DVT.      a.     Diagnosed in June 2008 at [facility].      b.     Treated with Coumadin anticoagulation.  2. Multiple cardiac risk factors.      a.     Negative adequate exercise stress Cardiolite; EF 70%,       December 2005.      b.     Associated PAF during stress testing.  3. Type-2 diabetes mellitus.  4. Hypertension.  5. Dyslipidemia.  6. Obesity.  7. Severe arthritis of the left knee.      a.     Awaiting surgical replacement.   PLAN:  1. Schedule right lower extremity venous Doppler to rule out      persistent DVT. If this is negative, then we will consider      discontinuing Coumadin now that he is approximately four months out      from initiation.  2. Schedule Adenosine stress Cardiolite for risk stratification. The      patient has several cardiac risk factors and is planning on  having      left total knee replacement in the upcoming months. This would      therefore serve as a preoperative screening study and, if negative      for ischemia, no further work up would be indicated.  3. Schedule return clinic follow up with myself and Dr. Dannielle [name] for      review of these study results. At that time, we will make a final      decision regarding whether or not to continue with Coumadin for a      full six months. Of note, we will take into account the fact that      he had      documented PAF during his stress test in 2005. Given his      aforementioned risk factors, and including age, this would project      to a [place] 2 score of 3.      Don Serpe, PA-C  Electronically Signed      Don Mcmurray, MD,FACC  Electronically Signed   GS/MedQ  DD: 11/23/2006  DT: 11/24/2006  Job #: [id number]   cc:   Don [name]
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
## 2010-07-16 NOTE — Op Note (Signed)
NAME:  Don Hall, Don Hall NO.:  [id number]   MEDICAL RECORD NO.:  [id number]          PATIENT TYPE:  AMB   LOCATION:  SDS                          FACILITY:  [facility]   PHYSICIAN:  Cynda Familia, M.D.DATE OF BIRTH:  [birth year]   DATE OF PROCEDURE:  02/03/2006  DATE OF DISCHARGE:                               OPERATIVE REPORT   PREOPERATIVE DIAGNOSES:  Right shoulder chronic impingement syndrome,  rotator cuff tear, acromioclavicular joint arthritis, possible labral  tear, possible biceps tendon.   POSTOPERATIVE DIAGNOSES:  Right-hand side chronic impingement syndrome,  large retracted rotator cuff tear, symptomatic acromioclavicular  arthritis.   PROCEDURES:  Right shoulder examination under anesthesia, glenohumeral  diagnostic arthroscopy, subacromial decompression with acromioplasty,  bursectomy and coracoacromial ligament release, distal clavicle  resection, Mumford procedure, arthroscopic rotator cuff repair.   SURGEON:  Cynda Familia, M.D.   ASSISTANT:  Kasandra Knudsen, PA-C.   ANESTHESIA:  Interscalene block with ganglion.   BLOOD LOSS:  Less than 10 cc.   DRAINS:  None.   COMPLICATIONS:  None.   DISPOSITION:  PACU stable.   OPERATIVE DETAILS:  The patient was counseled in the holding area.  An  IV was started.  Taken to the OR, positioned, general anesthesia.  Interscalene block had been administered in the holding area.  Antibiotics were given.  He was turned to a left lateral decubitus  position, properly padded and bumped.  The right shoulder was examined.  Forward flexion was 60.  Abduction was 60.  External rotation was 70.  Internal rotation was 90 degrees.  Prepped with DuraPrep, draped in a  sterile fashion.  Arthroscopic shoulder positioner was utilized, 30  degrees of abduction, 10 degrees of forward flexion and 9 pounds of  longitudinal traction.  Posterior portal was created.  An arthroscope  was placed in the  glenohumeral joint.  Diagnostic arthroscopy was  undertaken.  Labrum, biceps articular cartilage appeared to be healthy.  Rotator cuff tear was identified.  Large, going from the biceps tendon  posteriorly.  Subacromial was then inspected.  Thick subacromial bursa.  Lateral portals were created.  Subacromial bursectomy was performed.  Type 3 acromion.  ArthroCare system was used.  Lateral release of  periosteum, CA ligament.  A portal was then placed posteriorly, and an  anterior inferior acromioplasty was performed.  At this point in time,  we then mobilized the rotator cuff both intra-articularly and extra-  articularly, and it could come back to its anatomic insertion site.  However, the cuff was only of fair quality tissue.   The [place] joint was identified with a localizing needle.  A small incision  was made, and the lateral 5 to 8 mm of clavicle was resected  circumferentially, leaving the superior posterior coracoacromial  ligaments and capsule intact.  The clavicle was palpated and found to be  stable.  Hemostasis was obtained.  The rotator cuff insertion site was  then burred with a bur down to nice bone.  An Arthrex bioabsorbable  anchor was placed at the appropriate level.  Mattress sutures were  placed and tied down sequentially,  and then, utilizing a push-lock  anchor, we had a nice repair of the rotator cuff back to its anatomic  insertion site.  I will note that the cuff tear was large.  It was  repaired back as well as possible.  The cuff tissue was of fair quality.  No other abnormalities were noted.  Irrigating fluid and arthroscopic  equipment were removed.  Taken out of traction.  Normal pulses in the  hand at the end of the case.  Normal circulation in the hand at the end  of the case.  The portals were closed with 4-0 nylon suture.  Sterile  dressing applied to the shoulder.  Placed in a shoulder abduction sling,  awakened and taken from the operating room to the PACU  in stable  condition.  The patient tolerated the procedure well.  There were no  complications or problems.   This was a difficult surgical technique, and the assistance of Mr. Odis Hollingshead, PA-C, was needed.           ______________________________  Cynda Familia, M.D.     RAC/MEDQ  D:  02/03/2006  T:  02/04/2006  Job:  [id number]

## 2010-07-16 NOTE — Op Note (Signed)
NAME:  Don Hall, Don Hall NO.:  [id number]   MEDICAL RECORD NO.:  [id number]          PATIENT TYPE:  AMB   LOCATION:  DAY                           FACILITY:  [facility]   PHYSICIAN:  R. Garfield Cornea, M.D. DATE OF BIRTH:  1927-10-08   DATE OF PROCEDURE:  03/05/2004  DATE OF DISCHARGE:                                 OPERATIVE REPORT   PROCEDURE PERFORMED:  Esophagogastroduodenoscopy.   INDICATIONS FOR PROCEDURE:  The patient is a 77 year old gentleman with  epigastric pain and indigestion, recent 10-pound weight loss over the past  month.  Symptoms minimally improved with Protonix and subsequently a course  of Nexium; however, he gets significant relief with Tums following meals.  Symptoms occur primarily postprandially.  He is status post cholecystectomy  some 20 years ago by Georgina Quint, M.D. down in [place].  Liver  function tests and amylase were recently found to be completely normal  through my office.  Esophagogastroduodenoscopy is now being done.  This  approach has been discussed with the patient.  The potential risks, benefits  and alternatives have been reviewed and questions answered.  The patient is  agreeable.  Please see documentation in the medical record for more  information.   Oxygen saturations, blood pressure, pulse and respirations were monitored  throughout the entirety of the procedure.   CONSCIOUS SEDATION:  Versed 2 mg IV, Demerol 50 mg IV in divided doses.   INSTRUMENT USED:  Olympus video chip system.   FINDINGS:  Examination of the tubular esophagus revealed no mucosal  abnormalities.  Esophagogastric junction was easily traversed.   Stomach:  The gastric cavity was emptied and insufflated well with air.  Thorough examination of the gastric mucosa including retroflex view of the  proximal stomach, esophagogastric junction demonstrated only a small hiatal  hernia. The proximal stomach was well seen as was the remainder of the  gastric mucosa.  Pylorus was patent and easily traversed.  Examination of  the bulb, second and third portion revealed no abnormalities.   THERAPY/DIAGNOSTIC MANEUVERS PERFORMED:  None.   The patient tolerated the procedure well was reacted in endoscopy.   IMPRESSION:  Normal esophagus, small hiatal hernia, otherwise normal  stomach, normal D1 and D2.   RECOMMENDATIONS:  Today's findings do not explain his symptoms.  We will  switch him out to a brief course of Prevacid 30 mg orally daily. He is to go  by office for two week sample supply. Will proceed with abdominal CT scan  with IV oral contrast.  Will make further recommendations in the very near  future.     Elvera Bicker   RMR/MEDQ  D:  03/05/2004  T:  03/05/2004  Job:  [id number]   cc:   [name]  [address], [address]  [place]  [place] [address]  Fax: [phone]

## 2010-07-16 NOTE — Op Note (Signed)
NAME:  Don Hall, Don Hall NO.:  [id number]   MEDICAL RECORD NO.:  [id number]          PATIENT TYPE:  AMB   LOCATION:  DAY                           FACILITY:  [facility]   PHYSICIAN:  R. Garfield Cornea, M.D. DATE OF BIRTH:  [birth year]   DATE OF PROCEDURE:  04/06/2006  DATE OF DISCHARGE:                               OPERATIVE REPORT   PROCEDURE:  Diagnostic colonoscopy, ileoscopy.   INDICATIONS FOR PROCEDURE:  The patient is a 77 year old gentleman who  presented with a 5-week history of nonbloody diarrhea. C. difficile  toxin assay negative x1, has positive family history of colon cancer in  a first degree relative.  One week after saw him in the office, he tells  me his diarrhea resolved.  He is having one to two formed stools daily.  Colonoscopy is now being performed.  This approach has been discussed  with the patient at length.  Potential risks, benefits and alternatives  have been reviewed, questions answered and agreeable.  Please see  documentation in the medical record.   PROCEDURE NOTE:  O2 saturation, blood pressure, pulse and respirations  were monitored throughout the entire procedure.   CONSCIOUS SEDATION:  Versed 3 mg IV and Demerol 50 mg IV in divided  doses.   INSTRUMENT:  Pentax video chip system.   FINDINGS:  Digital rectal exam revealed no abnormalities.   ENDOSCOPIC FINDINGS:  Prep was excellent.  The colonic mucosa was  surveyed from the rectosigmoid junction through the left transverse and  right colon to the appendiceal orifice, ileocecal valve and cecum.  These structures well seen and photographed for the record.  Terminal ileum was intubated 10 cm.  From this level, scope was slowly  withdrawn.  All previously mentioned  mucosal surfaces were again seen.  The patient had pan colonic diverticulum, colonic mucosa appeared  normal, terminal ileal mucosa appeared normal.  The scope was pulled  down in the rectum where thorough examination of  the rectal mucosa  including retroflex view anal verge was undertaken.  The rectal mucosa  appeared normal.  The patient tolerated the procedure well and was  reactive to endoscopy.   IMPRESSION:  1. Normal rectum.  2. Pan colonic diverticulum and colonic mucosa appeared normal.  3. Normal terminal ileum.   RECOMMENDATIONS:  1. Diverticulosis literature provided to Mr. Ohora.  2. He is to have a high-risk screening colonoscopy in 5 years.      Bridgette Habermann, M.D.  Electronically Signed     RMR/MEDQ  D:  04/06/2006  T:  04/06/2006  Job:  [id number]   cc:   [name]  Fax: [phone]

## 2010-08-03 ENCOUNTER — Ambulatory Visit (INDEPENDENT_AMBULATORY_CARE_PROVIDER_SITE_OTHER): Payer: Medicare Other | Admitting: *Deleted

## 2010-08-03 DIAGNOSIS — I82409 Acute embolism and thrombosis of unspecified deep veins of unspecified lower extremity: Secondary | ICD-10-CM

## 2010-08-03 DIAGNOSIS — I4891 Unspecified atrial fibrillation: Secondary | ICD-10-CM

## 2010-08-03 DIAGNOSIS — Z7901 Long term (current) use of anticoagulants: Secondary | ICD-10-CM

## 2010-08-31 ENCOUNTER — Ambulatory Visit (INDEPENDENT_AMBULATORY_CARE_PROVIDER_SITE_OTHER): Payer: Medicare Other | Admitting: *Deleted

## 2010-08-31 DIAGNOSIS — I82409 Acute embolism and thrombosis of unspecified deep veins of unspecified lower extremity: Secondary | ICD-10-CM

## 2010-08-31 DIAGNOSIS — I4891 Unspecified atrial fibrillation: Secondary | ICD-10-CM

## 2010-08-31 DIAGNOSIS — Z7901 Long term (current) use of anticoagulants: Secondary | ICD-10-CM

## 2010-09-24 ENCOUNTER — Encounter: Payer: Medicare Other | Admitting: *Deleted

## 2010-09-24 ENCOUNTER — Ambulatory Visit (INDEPENDENT_AMBULATORY_CARE_PROVIDER_SITE_OTHER): Payer: Medicare Other | Admitting: *Deleted

## 2010-09-24 DIAGNOSIS — I4891 Unspecified atrial fibrillation: Secondary | ICD-10-CM

## 2010-09-24 DIAGNOSIS — Z7901 Long term (current) use of anticoagulants: Secondary | ICD-10-CM

## 2010-09-24 DIAGNOSIS — I82409 Acute embolism and thrombosis of unspecified deep veins of unspecified lower extremity: Secondary | ICD-10-CM

## 2010-10-22 ENCOUNTER — Ambulatory Visit (INDEPENDENT_AMBULATORY_CARE_PROVIDER_SITE_OTHER): Payer: Medicare Other | Admitting: *Deleted

## 2010-10-22 DIAGNOSIS — I4891 Unspecified atrial fibrillation: Secondary | ICD-10-CM

## 2010-10-22 DIAGNOSIS — I82409 Acute embolism and thrombosis of unspecified deep veins of unspecified lower extremity: Secondary | ICD-10-CM

## 2010-10-22 DIAGNOSIS — Z7901 Long term (current) use of anticoagulants: Secondary | ICD-10-CM

## 2010-11-09 ENCOUNTER — Ambulatory Visit (INDEPENDENT_AMBULATORY_CARE_PROVIDER_SITE_OTHER): Payer: Medicare Other | Admitting: *Deleted

## 2010-11-09 DIAGNOSIS — Z7901 Long term (current) use of anticoagulants: Secondary | ICD-10-CM

## 2010-11-09 DIAGNOSIS — I82409 Acute embolism and thrombosis of unspecified deep veins of unspecified lower extremity: Secondary | ICD-10-CM

## 2010-11-09 DIAGNOSIS — I4891 Unspecified atrial fibrillation: Secondary | ICD-10-CM

## 2010-11-18 LAB — URINALYSIS, ROUTINE W REFLEX MICROSCOPIC
Bilirubin Urine: NEGATIVE
Glucose, UA: NEGATIVE
Hgb urine dipstick: NEGATIVE
Ketones, ur: NEGATIVE
Nitrite: NEGATIVE
Specific Gravity, Urine: 1.009
pH: 5.5

## 2010-11-18 LAB — CROSSMATCH: Antibody Screen: NEGATIVE

## 2010-11-18 LAB — CBC
HCT: 25.6 — ABNORMAL LOW
HCT: 28.4 — ABNORMAL LOW
HCT: 29.8 — ABNORMAL LOW
Hemoglobin: 13.3
Hemoglobin: 10.3 — ABNORMAL LOW
Hemoglobin: 9.9 — ABNORMAL LOW
MCHC: 33.9
MCHC: 34.3
MCHC: 34.5
MCV: 89
MCV: 88.8
MCV: 89
MCV: 89.2
Platelets: 138 — ABNORMAL LOW
RBC: 4.4
RBC: 3.34 — ABNORMAL LOW
RDW: 13.6
WBC: 6.9
WBC: 10.4
WBC: 10.8 — ABNORMAL HIGH

## 2010-11-18 LAB — LIPID PANEL
Cholesterol: 97
Total CHOL/HDL Ratio: 3.3
VLDL: 9

## 2010-11-18 LAB — COMPREHENSIVE METABOLIC PANEL
ALT: 18
ALT: 14
AST: 17
AST: 17
CO2: 25
CO2: 25
Calcium: 9.3
Calcium: 8.2 — ABNORMAL LOW
Chloride: 108
Chloride: 104
Creatinine, Ser: 1.61 — ABNORMAL HIGH
Creatinine, Ser: 1.6 — ABNORMAL HIGH
GFR calc Af Amer: 51 — ABNORMAL LOW
GFR calc non Af Amer: 42 — ABNORMAL LOW
GFR calc non Af Amer: 42 — ABNORMAL LOW
Glucose, Bld: 148 — ABNORMAL HIGH
Glucose, Bld: 202 — ABNORMAL HIGH
Sodium: 140
Total Bilirubin: 0.9
Total Bilirubin: 1

## 2010-11-18 LAB — BASIC METABOLIC PANEL
BUN: 25 — ABNORMAL HIGH
CO2: 26
Chloride: 103
Chloride: 105
GFR calc non Af Amer: 45 — ABNORMAL LOW
Glucose, Bld: 133 — ABNORMAL HIGH
Potassium: 4
Potassium: 4.4
Sodium: 135

## 2010-11-18 LAB — PROTIME-INR
INR: 1.1
Prothrombin Time: 14.1

## 2010-11-18 LAB — DIFFERENTIAL
Basophils Absolute: 0
Eosinophils Absolute: 0.3
Eosinophils Relative: 4
Lymphs Abs: 1.2
Neutrophils Relative %: 69

## 2010-11-18 LAB — HEMOGLOBIN A1C: Hgb A1c MFr Bld: 5.9

## 2010-11-19 LAB — DIFFERENTIAL
Basophils Absolute: 0
Eosinophils Relative: 3
Lymphocytes Relative: 18
Lymphs Abs: 1.2
Monocytes Absolute: 0.6
Monocytes Relative: 9

## 2010-11-19 LAB — BASIC METABOLIC PANEL
GFR calc non Af Amer: 41 — ABNORMAL LOW
Glucose, Bld: 119 — ABNORMAL HIGH
Potassium: 4.5
Sodium: 135

## 2010-11-19 LAB — CBC
HCT: 29.3 — ABNORMAL LOW
Hemoglobin: 10 — ABNORMAL LOW
RDW: 14.2

## 2010-11-19 LAB — URINALYSIS, ROUTINE W REFLEX MICROSCOPIC
Bilirubin Urine: NEGATIVE
Ketones, ur: NEGATIVE
Nitrite: NEGATIVE
Protein, ur: NEGATIVE
Urobilinogen, UA: 0.2
pH: 5.5

## 2010-11-23 ENCOUNTER — Ambulatory Visit (INDEPENDENT_AMBULATORY_CARE_PROVIDER_SITE_OTHER): Payer: Medicare Other | Admitting: *Deleted

## 2010-11-23 DIAGNOSIS — I82409 Acute embolism and thrombosis of unspecified deep veins of unspecified lower extremity: Secondary | ICD-10-CM

## 2010-11-23 DIAGNOSIS — I4891 Unspecified atrial fibrillation: Secondary | ICD-10-CM

## 2010-11-23 DIAGNOSIS — Z7901 Long term (current) use of anticoagulants: Secondary | ICD-10-CM

## 2010-12-17 ENCOUNTER — Encounter: Payer: Self-pay | Admitting: *Deleted

## 2010-12-21 ENCOUNTER — Encounter: Payer: Self-pay | Admitting: Cardiology

## 2010-12-21 ENCOUNTER — Ambulatory Visit (INDEPENDENT_AMBULATORY_CARE_PROVIDER_SITE_OTHER): Payer: Medicare Other | Admitting: *Deleted

## 2010-12-21 ENCOUNTER — Ambulatory Visit (INDEPENDENT_AMBULATORY_CARE_PROVIDER_SITE_OTHER): Payer: Medicare Other | Admitting: Cardiology

## 2010-12-21 VITALS — BP 138/74 | HR 80 | Ht 70.0 in | Wt 225.0 lb

## 2010-12-21 DIAGNOSIS — Z7901 Long term (current) use of anticoagulants: Secondary | ICD-10-CM

## 2010-12-21 DIAGNOSIS — I4891 Unspecified atrial fibrillation: Secondary | ICD-10-CM

## 2010-12-21 DIAGNOSIS — I82409 Acute embolism and thrombosis of unspecified deep veins of unspecified lower extremity: Secondary | ICD-10-CM

## 2010-12-21 MED ORDER — ENOXAPARIN SODIUM 80 MG/0.8ML ~~LOC~~ SOLN: 80.0000 mg | Freq: Two times a day (BID) | SUBCUTANEOUS | Status: DC

## 2010-12-21 NOTE — Patient Instructions (Addendum)
Your physician wants you to follow-up in: 6 months. You will receive a reminder letter in the mail one-two months in advance. If you don't receive a letter, please call our office to schedule the follow-up appointment. Your physician recommends that you continue on your current medications as directed. Please refer to the Current Medication list given to you today. Notify Lattie Haw when surgery is scheduled to arrange Lovenox.

## 2011-01-11 ENCOUNTER — Ambulatory Visit (INDEPENDENT_AMBULATORY_CARE_PROVIDER_SITE_OTHER): Payer: Medicare Other | Admitting: *Deleted

## 2011-01-11 DIAGNOSIS — I82409 Acute embolism and thrombosis of unspecified deep veins of unspecified lower extremity: Secondary | ICD-10-CM

## 2011-01-11 DIAGNOSIS — I4891 Unspecified atrial fibrillation: Secondary | ICD-10-CM

## 2011-01-11 DIAGNOSIS — Z7901 Long term (current) use of anticoagulants: Secondary | ICD-10-CM

## 2011-01-11 LAB — POCT INR: INR: 3.3

## 2011-01-12 NOTE — Assessment & Plan Note (Signed)
Stable no recurrence. Multiple risk factors for stroke. Patient is to continue chronic Coumadin therapy. Patient will require bridging with Lovenox as outlined above.

## 2011-01-12 NOTE — Progress Notes (Signed)
CC: Preoperative evaluation  HPI:  I saw Don Hall in preoperative cardiac consultation today. As you may recall this patient has a history of recurrent DVTs X4 secondary to an undefined hypercoagulable state]) and history with a single episode of paroxysmal atrial fibrillation noted during exercise stress testing in 2008. He has multiple risk factors for stroke including diabetes mellitus, hypertension as well as age.His CHADS2-Vasc score is 4 consistent with high risk for thromboembolic disease. The patient is on chronic Coumadin therapy and within the last year he suffered a spontaneous hemorrhage in the right thigh while his INR was greater than 3. Doppler studies done in the last year showed no evidence of recurrent DVT. The patient also had a Cardiolite study done in 2008 which was negative for ischemia.  From a clinical standpoint the patient is stable with no chest pain or shortness of breath or other cardiovascular symptoms. I performed a bedside echocardiogram in the office today. The patient has normal LV function and size and no significant valvular abnormalities. His estimated ejection fraction is 55-60%.  The patient will need bridging Lovenox prior to total knee replacement and perioperatively. He was already given Lovenox vials by the [place]. However the dosing appeared to low and he was given 30 mg vials. The patient is followed in our Coumadin clinic by Edrick Oh. I've spoken to her and the patient and we will properly dose him with Lovenox 0.75 mg every 12 hours after Coumadin has been discontinued 5 days prior to surgery. As soon as his INR is less than 2, Lovenox can be started. I would recommend that he would hold his dose of Lovenox the night before surgery as well as the morning off. Postoperative anticoagulation and DVT prophylaxis can be decided as per your usual protocol. However, I would restartCoumadin as early as possible and consider full dose Lovenox postoperatively provided that  you fell that the patient is stable from a hemostasis standpoint.  At this point the patient does not need any further cardiovascular testing and he should be at low risk for any type of cardiovascular complications related to his knee surgery.   PMH: reviewed and listed in Problem List in Electronic Records (and see below)  Allergies/SH/FHX : available in Electronic Records for review  Medications: Current Outpatient Prescriptions  Medication Sig Dispense Refill  . glipiZIDE (GLUCOTROL) 10 MG tablet Take 10 mg by mouth daily.        . Horse Chestnut (VENASTAT) 300 MG CPCR Take 1 capsule by mouth daily.        Marland Kitchen lisinopril-hydrochlorothiazide (PRINZIDE,ZESTORETIC) 20-12.5 MG per tablet Take 1.5 tablets by mouth daily.        . Multiple Vitamins-Minerals (PRESERVISION AREDS 2 PO) Take 1 tablet by mouth 2 (two) times daily.        Marland Kitchen omeprazole (PRILOSEC) 40 MG capsule Take 40 mg by mouth 2 (two) times daily.        . predniSONE (DELTASONE) 5 MG tablet Take 5 mg by mouth daily.        . simvastatin (ZOCOR) 20 MG tablet Take 20 mg by mouth at bedtime.        Marland Kitchen warfarin (COUMADIN) 5 MG tablet Take by mouth as directed.        . enoxaparin (LOVENOX) 80 MG/0.8ML SOLN Inject 0.8 mLs (80 mg total) into the skin every 12 (twelve) hours.  20 Syringe  1    ROS: No nausea or vomiting. No fever or chills.No melena or hematochezia.No  bleeding.No claudication  Physical Exam: BP 138/74  Pulse 80  Ht 5\' 10"  (1.778 m)  Wt 225 lb (102.059 kg)  BMI 32.28 kg/m2 General: Well-nourished male in no distress Neck: Normal carotid upstroke no carotid bruits Lungs: Clear breath sounds bilaterally no wheezing Cardiac: Regular rate and rhythm with normal S1-S2 no murmurs or gallops Vascular: No edema. Normal peripheral pulses Skin: Warm and dry  12lead ECG: Limited bedside ECHO:N/A   Assessment and Plan

## 2011-01-12 NOTE — Assessment & Plan Note (Signed)
Long-term Coumadin therapy. Please see instructions above and in the latter regarding further recommendations.

## 2011-01-31 ENCOUNTER — Other Ambulatory Visit: Payer: Self-pay | Admitting: Pain Medicine

## 2011-01-31 DIAGNOSIS — M1710 Unilateral primary osteoarthritis, unspecified knee: Secondary | ICD-10-CM

## 2011-01-31 NOTE — H&P (Signed)
NAME:  [name], [name] NO.:  [id number]  MEDICAL RECORD NO.:  [id number]  LOCATION:  [facility]IO                        FACILITY:  [facility]  PHYSICIAN:  Cynda Familia, M.D.DATE OF BIRTH:  [birth year]  DATE OF ADMISSION:  01/03/2011 DATE OF DISCHARGE:                             HISTORY & PHYSICAL   CHIEF COMPLAINT:  Painful loss range of motion, right knee.  HISTORY OF PRESENT ILLNESS:  The patient is an 77 year old gentleman, well known to Dr. Theda Sers for evaluation of multiple orthopedic issues. He does have a history of left total knee arthroplasty in 2009.  He did very well.  The patient continued to have pain and problems with his right knee.  He has failed conservative treatment.  Dr. Theda Sers indicated his course of action would be a total knee arthroplasty.  The patient elected to proceed.  He has been evaluated and cleared by his primary care physician Dr. [name] and cardiologist Dr. Dannielle Burn. The patient's x-rays reveal he has end-stage osteoarthritis of the knee on the right with bone-on-bone medial compartment, significant lateral compartment changes and significant patellofemoral degenerative changes.  PRIMARY CARE PHYSICIAN:  [name], M.D.  CARDIOLOGIST:  Dr. Dannielle Burn.  RHEUMATOLOGIST:  Dr. Amil Amen.  RENAL:  Managed by primary care physician and Dr. __________ at [facility].  ALLERGIES:  None.  CURRENT MEDICATIONS: 1. Glipizide 10 mg once a day. 2. Lisinopril/hydrochlorothiazide 20/12.5 mg 1-1/2 tablets a day. 3. Omeprazole 40 mg 1 tablet twice a day. 4. Simvastatin 20 mg once a day. 5. Coumadin 5 mg a day. 6. Prednisone 5 mg a day.  He has been tapered down, initiated with a     15 mg.  He has been on this in excess of 6 weeks. 7. PreserVision soft gel 2 a day for macular degeneration. 8. Venastat 1 tablet a day for circulation.  PRESENT MEDICAL HISTORY:  Includes: 1. History of multiple lower extremity DVTs. 2. Hypertension. 3.  Diabetes. 4. Chronic renal insufficiency. 5. Reflux disease. 6. Macular degeneration. 7. Hearing impairment with bilateral hearing aids. 8. History of some skin cancers. 9. History of chronic lumbar pain, presently on prednisone.  REVIEW OF SYSTEMS:  NEUROLOGIC:  He does have chronic lower back pain. He is on prednisone through his rheumatologist office.  He does have history of macular degeneration and shingles and hearing impairment.  He denies any strokes, seizures, convulsions, any unusual numbness, tingling, memory loss.  PULMONARY:  Unremarkable for any asthma, shortness of breath, productive cough, wheezing, history of COPD, sleep apnea, or tuberculosis.  CARDIOVASCULAR:  He is well controlled on his hypertension meds.  He denies any heart disease.  He has recently had a stress test in 2000.  Per Dr. Dannielle Burn, echo done in his office in October showed an ejection fraction of 55-60%.  No significant valvular abnormalities.  Normal LV function.  GI:  He does have reflux disease well controlled.  Currently, on his omeprazole.  He denies any ulcers in the past.  Denies any jaundice, hepatitis or irritable bowel syndrome. No unusual constipation, diarrhea.  His gallbladder removed 20 years previous.  GU:  He does have decreased kidney function.  The patient's GFR was in the  40s.  It has been stable.  He denies any history of kidney stones.  No burning or difficulty urinating.  ENDOCRINE:  He denies any issues related to heat or cold intolerance.  No unusual loss of energy.  He is diabetic, well controlled on his current medications. HEMATOLOGIC:  He has had multiple DVTs in the lower extremities in the past, last time in 2009 with his left total knee arthroplasty.  He has had a couple of multiple bleeding events where his INR starts getting high.  PAST SURGICAL HISTORY:  Includes skin grafts in the 1960s, gallbladder removed in 1994, multiple knee scopes, rotator cuff repair,  hernia repair without any complications from anesthesia.  FAMILY HISTORY: Father is deceased at the age of 50.  Mother is deceased at 3 from a motor vehicle accident.  He is married.  He denies any alcohol use.  He has 4 grown children.  He lives in a single story home and does have a living will and power of attorney.  He would like to return home after his surgery for outpatient rehab.  PHYSICAL EXAMINATION:  VITAL SIGNS:  Height 5 foot 10 inches, weight 225 pounds, blood pressure 142/62, pulse of 80 and regular, respirations 12 and nonlabored. GENERAL:  This is a healthy-appearing, well-developed gentleman, who appears to be in no distress. NECK:  Supple with no palpable lymphadenopathy.  Good range of motion. CHEST:  Lung sounds were clear throughout. HEART:  Regular rate and rhythm. ABDOMEN:  Soft, nontender. EXTREMITIES:  Upper extremities had full range of motion.  Good motor strength.  Lower extremities:  He had good motion of both hips.  His left knee:  He was able to fully extend it.  He was able to flex it back to 120.  He had a well-healed midline surgical incision.  Calf is soft and nontender.  His right knee:  He lacks about 5 degrees of extension, able to flex it back to about 115 degrees.  Calf was soft and nontender. Both ankles had good motion. PERIPHERAL VASCULAR:  Carotid pulses were 2+.  No bruits.  Radial pulses were 2+.  Posterior tibial pulses were 2+.  He had no lower extremity edema or venous stasis changes. NEUROLOGIC:  The patient is conscious, alert, and appropriate. BREAST, RECTAL, AND GU:  Exams were deferred at this time.  IMPRESSION: 1. End-stage osteoarthritis, right knee. 2. Bone-on-bone medial compartment with significant patellofemoral     lateral compartment changes. 3. History of left total knee arthroplasty. 4. History of multiple deep vein thrombosis, on chronic Coumadin     therapy. 5. Hypertension. 6. Hypercholesterolemia. 7. Reflux  disease. 8. Diabetes. 9. Macular degeneration. 10.History of chronic renal insufficiency. 11.History of chronic lumbar degenerative disk disease and arthritis.  The patient will undergo routine labs tests prior to having a right total knee arthroplasty by Dr. Theda Sers at [facility] on February 11, 2011.  The patient's medical history was reviewed.  He was cleared by his primary care physician and cardiologist.  He will be bridged 5 days prior to his surgery once he stops his Coumadin with Lovenox.  We will get him back on Lovenox and Coumadin on postop day #1. The patient is aware of the increased risks and complications due to his significant medical history.     Evert Kohl, P.A.   ______________________________ Cynda Familia, M.D.    RWK/MEDQ  D:  01/31/2011  T:  01/31/2011  Job:  [id number]  cc:  Cynda Familia, M.D. Fax: [phone]

## 2011-02-04 ENCOUNTER — Encounter (HOSPITAL_COMMUNITY): Payer: Self-pay | Admitting: Pharmacy Technician

## 2011-02-04 ENCOUNTER — Ambulatory Visit (INDEPENDENT_AMBULATORY_CARE_PROVIDER_SITE_OTHER): Payer: Medicare Other | Admitting: *Deleted

## 2011-02-04 DIAGNOSIS — Z7901 Long term (current) use of anticoagulants: Secondary | ICD-10-CM

## 2011-02-04 DIAGNOSIS — I4891 Unspecified atrial fibrillation: Secondary | ICD-10-CM

## 2011-02-04 DIAGNOSIS — I82409 Acute embolism and thrombosis of unspecified deep veins of unspecified lower extremity: Secondary | ICD-10-CM

## 2011-02-04 LAB — POCT INR: INR: 1.5

## 2011-02-04 NOTE — Patient Instructions (Signed)
12/8  Last dose of coumadin 12/9  No Lovenox or coumadin 12/10 - 12/12  Lovenox 80mg  SQ BID 12/13  Lovenox 80mg  in am only 12/14  Surgery TKR  No Lovenox Requested pt have Home Health check INR at D/C from hospital

## 2011-02-08 ENCOUNTER — Encounter (HOSPITAL_COMMUNITY): Payer: Self-pay

## 2011-02-08 ENCOUNTER — Encounter (HOSPITAL_COMMUNITY)
Admission: RE | Admit: 2011-02-08 | Discharge: 2011-02-08 | Disposition: A | Discharge status: Home / Self Care | Payer: Medicare Other | Source: Ambulatory Visit | Attending: Specialist | Admitting: Specialist

## 2011-02-08 ENCOUNTER — Ambulatory Visit (HOSPITAL_COMMUNITY)
Admission: RE | Admit: 2011-02-08 | Discharge: 2011-02-08 | Disposition: A | Discharge status: Home / Self Care | Payer: Medicare Other | Source: Ambulatory Visit | Attending: Specialist | Admitting: Specialist

## 2011-02-08 DIAGNOSIS — Z01812 Encounter for preprocedural laboratory examination: Secondary | ICD-10-CM | POA: Insufficient documentation

## 2011-02-08 DIAGNOSIS — Z01818 Encounter for other preprocedural examination: Secondary | ICD-10-CM | POA: Insufficient documentation

## 2011-02-08 HISTORY — DX: Gastro-esophageal reflux disease without esophagitis: K21.9

## 2011-02-08 HISTORY — DX: Unspecified osteoarthritis, unspecified site: M19.90

## 2011-02-08 LAB — DIFFERENTIAL
Basophils Absolute: 0 10*3/uL (ref 0.0–0.1)
Basophils Relative: 0 % (ref 0–1)
Eosinophils Absolute: 0.1 10*3/uL (ref 0.0–0.7)
Eosinophils Relative: 1 % (ref 0–5)
Monocytes Absolute: 0.4 10*3/uL (ref 0.1–1.0)

## 2011-02-08 LAB — CBC
Hemoglobin: 12.7 g/dL — ABNORMAL LOW (ref 13.0–17.0)
MCH: 30.8 pg (ref 26.0–34.0)
MCV: 93.9 fL (ref 78.0–100.0)
Platelets: 204 10*3/uL (ref 150–400)
RBC: 4.12 MIL/uL — ABNORMAL LOW (ref 4.22–5.81)
WBC: 8.8 10*3/uL (ref 4.0–10.5)

## 2011-02-08 LAB — COMPREHENSIVE METABOLIC PANEL
ALT: 26 U/L (ref 0–53)
AST: 19 U/L (ref 0–37)
CO2: 28 mEq/L (ref 19–32)
Calcium: 9.7 mg/dL (ref 8.4–10.5)
Chloride: 103 mEq/L (ref 96–112)
GFR calc Af Amer: 46 mL/min — ABNORMAL LOW (ref >90)
GFR calc non Af Amer: 40 mL/min — ABNORMAL LOW (ref >90)
Glucose, Bld: 165 mg/dL — ABNORMAL HIGH (ref 70–99)
Sodium: 138 mEq/L (ref 135–145)
Total Bilirubin: 0.6 mg/dL (ref 0.3–1.2)

## 2011-02-08 LAB — PROTIME-INR: Prothrombin Time: 17.9 seconds — ABNORMAL HIGH (ref 11.6–15.2)

## 2011-02-08 LAB — URINALYSIS, ROUTINE W REFLEX MICROSCOPIC
Bilirubin Urine: NEGATIVE
Glucose, UA: NEGATIVE mg/dL
Hgb urine dipstick: NEGATIVE
Ketones, ur: NEGATIVE mg/dL
Nitrite: NEGATIVE
Specific Gravity, Urine: 1.017 (ref 1.005–1.030)
pH: 6 (ref 5.0–8.0)

## 2011-02-08 NOTE — Pre-Procedure Instructions (Addendum)
Dr Lutricia Feil cardiology clearance note on chart Dr Quillian Quince medical clearance note on chart Dental clearance note dr Garwin Brothers on chart cardiology last office visit note dr de gent  10-23 12 in epic under encounters 12-21-10 ekg in epic under ecg

## 2011-02-08 NOTE — Patient Instructions (Addendum)
[place]  02/08/2011   Your procedure is scheduled on: 02-11-2011  Report to [place] at [place] AM.  Call this number if you have problems the morning of surgery: [phone]   Remember:   Do not eat food:After Midnight.  May have clear liquids:until Midnight .  Clear liquids include soda, tea, black coffee, apple or grape juice, broth.  Take these medicines the morning of surgery with A SIP OF WATER: omeprazole, prednisone, refresh eye drop   Do not wear jewelry, Do not wear lotions, powders.    Do not bring valuables to the hospital.  Contacts, dentures or bridgework may not be worn into surgery.  Leave suitcase in the car. After surgery it may be brought to your room.  For patients admitted to the hospital, checkout time is 11:00 AM the day of discharge.     Special Instructions: CHG Shower Use Special Wash: 1/2 bottle night before surgery and 1/2 bottle morning of surgery.neck down avoid private area   Please read over the following fact sheets that you were given: MRSA Information, blood fact sheet, incentive spirometry sheet  Zelphia Cairo, rn [facility] pre op nurse phone number [phone]

## 2011-02-11 ENCOUNTER — Encounter (HOSPITAL_COMMUNITY): Payer: Self-pay | Admitting: *Deleted

## 2011-02-11 ENCOUNTER — Inpatient Hospital Stay (HOSPITAL_COMMUNITY): Payer: Medicare Other | Admitting: Anesthesiology

## 2011-02-11 ENCOUNTER — Inpatient Hospital Stay (HOSPITAL_COMMUNITY)
Admission: RE | Admit: 2011-02-11 | Discharge: 2011-02-14 | DRG: 470 | Disposition: A | Discharge status: Home Health | Payer: Medicare Other | Source: Ambulatory Visit | Attending: Specialist | Admitting: Specialist

## 2011-02-11 ENCOUNTER — Encounter (HOSPITAL_COMMUNITY): Payer: Self-pay | Admitting: Anesthesiology

## 2011-02-11 ENCOUNTER — Encounter (HOSPITAL_COMMUNITY)
Admission: RE | Disposition: A | Discharge status: Home Health | Payer: Self-pay | Source: Ambulatory Visit | Attending: Specialist

## 2011-02-11 DIAGNOSIS — D62 Acute posthemorrhagic anemia: Secondary | ICD-10-CM | POA: Diagnosis not present

## 2011-02-11 DIAGNOSIS — E785 Hyperlipidemia, unspecified: Secondary | ICD-10-CM

## 2011-02-11 DIAGNOSIS — N189 Chronic kidney disease, unspecified: Secondary | ICD-10-CM | POA: Diagnosis present

## 2011-02-11 DIAGNOSIS — I82409 Acute embolism and thrombosis of unspecified deep veins of unspecified lower extremity: Secondary | ICD-10-CM

## 2011-02-11 DIAGNOSIS — R58 Hemorrhage, not elsewhere classified: Secondary | ICD-10-CM

## 2011-02-11 DIAGNOSIS — I10 Essential (primary) hypertension: Secondary | ICD-10-CM

## 2011-02-11 DIAGNOSIS — I4891 Unspecified atrial fibrillation: Secondary | ICD-10-CM

## 2011-02-11 DIAGNOSIS — E871 Hypo-osmolality and hyponatremia: Secondary | ICD-10-CM | POA: Diagnosis not present

## 2011-02-11 DIAGNOSIS — M51379 Other intervertebral disc degeneration, lumbosacral region without mention of lumbar back pain or lower extremity pain: Secondary | ICD-10-CM | POA: Diagnosis present

## 2011-02-11 DIAGNOSIS — M1710 Unilateral primary osteoarthritis, unspecified knee: Principal | ICD-10-CM | POA: Diagnosis present

## 2011-02-11 DIAGNOSIS — I82509 Chronic embolism and thrombosis of unspecified deep veins of unspecified lower extremity: Secondary | ICD-10-CM | POA: Diagnosis present

## 2011-02-11 DIAGNOSIS — H3530 Unspecified macular degeneration: Secondary | ICD-10-CM | POA: Diagnosis present

## 2011-02-11 DIAGNOSIS — K219 Gastro-esophageal reflux disease without esophagitis: Secondary | ICD-10-CM | POA: Diagnosis present

## 2011-02-11 DIAGNOSIS — I129 Hypertensive chronic kidney disease with stage 1 through stage 4 chronic kidney disease, or unspecified chronic kidney disease: Secondary | ICD-10-CM | POA: Diagnosis present

## 2011-02-11 DIAGNOSIS — Z7901 Long term (current) use of anticoagulants: Secondary | ICD-10-CM

## 2011-02-11 DIAGNOSIS — Z96659 Presence of unspecified artificial knee joint: Secondary | ICD-10-CM

## 2011-02-11 DIAGNOSIS — M5137 Other intervertebral disc degeneration, lumbosacral region: Secondary | ICD-10-CM | POA: Diagnosis present

## 2011-02-11 DIAGNOSIS — E7800 Pure hypercholesterolemia, unspecified: Secondary | ICD-10-CM | POA: Diagnosis present

## 2011-02-11 HISTORY — PX: TOTAL KNEE ARTHROPLASTY: SHX125

## 2011-02-11 LAB — TYPE AND SCREEN: Antibody Screen: NEGATIVE

## 2011-02-11 LAB — PROTIME-INR: Prothrombin Time: 14.7 seconds (ref 11.6–15.2)

## 2011-02-11 LAB — GLUCOSE, CAPILLARY
Glucose-Capillary: 95 mg/dL (ref 70–99)
Glucose-Capillary: 157 mg/dL — ABNORMAL HIGH (ref 70–99)

## 2011-02-11 SURGERY — ARTHROPLASTY, KNEE, TOTAL
Anesthesia: General | Site: Knee | Laterality: Right | Wound class: Clean

## 2011-02-11 MED ORDER — METHOCARBAMOL 500 MG PO TABS
500.0000 mg | ORAL_TABLET | Freq: Four times a day (QID) | ORAL | Status: DC | PRN
  Administered 2011-02-12 – 2011-02-13 (×4): 500 mg via ORAL
  Filled 2011-02-11: qty 2
  Filled 2011-02-11 (×3): qty 1

## 2011-02-11 MED ORDER — METOCLOPRAMIDE HCL 5 MG/ML IJ SOLN: 5.0000 mg | Freq: Three times a day (TID) | INTRAMUSCULAR | Status: DC | PRN

## 2011-02-11 MED ORDER — LISINOPRIL-HYDROCHLOROTHIAZIDE 20-12.5 MG PO TABS
0.5000 | ORAL_TABLET | Freq: Every day | ORAL | Status: DC
Start: 2011-02-12 — End: 2011-02-12

## 2011-02-11 MED ORDER — OXYCODONE HCL 5 MG PO TABS
5.0000 mg | ORAL_TABLET | ORAL | Status: DC | PRN
  Administered 2011-02-12: 5 mg via ORAL
  Administered 2011-02-12 – 2011-02-13 (×6): 10 mg via ORAL
  Filled 2011-02-11: qty 2
  Filled 2011-02-11: qty 1
  Filled 2011-02-11 (×5): qty 2

## 2011-02-11 MED ORDER — LISINOPRIL 10 MG PO TABS: 10.0000 mg | ORAL_TABLET | Freq: Every day | ORAL | Status: DC

## 2011-02-11 MED ORDER — LIDOCAINE HCL (CARDIAC) 20 MG/ML IV SOLN
INTRAVENOUS | Status: DC | PRN
  Administered 2011-02-11: 25 mg via INTRAVENOUS

## 2011-02-11 MED ORDER — PANTOPRAZOLE SODIUM 40 MG PO TBEC
40.0000 mg | DELAYED_RELEASE_TABLET | Freq: Every day | ORAL | Status: DC
  Administered 2011-02-12 – 2011-02-14 (×3): 40 mg via ORAL
  Filled 2011-02-11 (×4): qty 1

## 2011-02-11 MED ORDER — LISINOPRIL 10 MG PO TABS
10.0000 mg | ORAL_TABLET | Freq: Every day | ORAL | Status: DC
  Filled 2011-02-11: qty 1

## 2011-02-11 MED ORDER — SODIUM CHLORIDE 0.9 % IV SOLN
INTRAVENOUS | Status: DC
  Administered 2011-02-11: 1000 mL via INTRAVENOUS

## 2011-02-11 MED ORDER — ONDANSETRON HCL 4 MG/2ML IJ SOLN
4.0000 mg | Freq: Four times a day (QID) | INTRAMUSCULAR | Status: DC | PRN
  Administered 2011-02-11 – 2011-02-12 (×4): 4 mg via INTRAVENOUS
  Filled 2011-02-11 (×4): qty 2

## 2011-02-11 MED ORDER — HYDROCHLOROTHIAZIDE 12.5 MG PO CAPS
12.5000 mg | ORAL_CAPSULE | Freq: Every day | ORAL | Status: DC
  Filled 2011-02-11: qty 1

## 2011-02-11 MED ORDER — SODIUM CHLORIDE 0.9 % IR SOLN
Status: DC | PRN
  Administered 2011-02-11: 3000 mL

## 2011-02-11 MED ORDER — PROPOFOL 10 MG/ML IV EMUL
INTRAVENOUS | Status: DC | PRN
  Administered 2011-02-11: 150 mg via INTRAVENOUS

## 2011-02-11 MED ORDER — SIMVASTATIN 20 MG PO TABS
20.0000 mg | ORAL_TABLET | Freq: Every day | ORAL | Status: DC
Start: 2011-02-11 — End: 2011-02-14
  Administered 2011-02-11 – 2011-02-13 (×3): 20 mg via ORAL
  Filled 2011-02-11 (×4): qty 1

## 2011-02-11 MED ORDER — ALUM & MAG HYDROXIDE-SIMETH 200-200-20 MG/5ML PO SUSP
30.0000 mL | ORAL | Status: DC | PRN
Start: 2011-02-11 — End: 2011-02-14

## 2011-02-11 MED ORDER — NEOSTIGMINE METHYLSULFATE 1 MG/ML IJ SOLN
INTRAMUSCULAR | Status: DC | PRN
  Administered 2011-02-11: 3 mg via INTRAVENOUS

## 2011-02-11 MED ORDER — NON FORMULARY: 6.2500 mg | Freq: Every day | Status: DC

## 2011-02-11 MED ORDER — LISINOPRIL-HYDROCHLOROTHIAZIDE 20-12.5 MG PO TABS: 1.0000 | ORAL_TABLET | ORAL | Status: DC

## 2011-02-11 MED ORDER — DOCUSATE SODIUM 100 MG PO CAPS
100.0000 mg | ORAL_CAPSULE | Freq: Two times a day (BID) | ORAL | Status: DC
  Administered 2011-02-11 – 2011-02-14 (×6): 100 mg via ORAL
  Filled 2011-02-11 (×7): qty 1

## 2011-02-11 MED ORDER — ACETAMINOPHEN 10 MG/ML IV SOLN
INTRAVENOUS | Status: DC | PRN
  Administered 2011-02-11: 1000 mg via INTRAVENOUS

## 2011-02-11 MED ORDER — LISINOPRIL-HYDROCHLOROTHIAZIDE 20-12.5 MG PO TABS: 0.5000 | ORAL_TABLET | Freq: Every evening | ORAL | Status: DC

## 2011-02-11 MED ORDER — METHOCARBAMOL 100 MG/ML IJ SOLN
500.0000 mg | Freq: Four times a day (QID) | INTRAVENOUS | Status: DC | PRN
  Filled 2011-02-11: qty 5

## 2011-02-11 MED ORDER — CEFAZOLIN SODIUM-DEXTROSE 2-3 GM-% IV SOLR
2.0000 g | INTRAVENOUS | Status: AC
  Administered 2011-02-11: 2 g via INTRAVENOUS

## 2011-02-11 MED ORDER — BUPIVACAINE HCL 0.25 % IJ SOLN
INTRAMUSCULAR | Status: DC | PRN
  Administered 2011-02-11: 30 mL

## 2011-02-11 MED ORDER — ACETAMINOPHEN 650 MG RE SUPP: 650.0000 mg | Freq: Four times a day (QID) | RECTAL | Status: DC | PRN

## 2011-02-11 MED ORDER — BISACODYL 10 MG RE SUPP: 10.0000 mg | Freq: Every day | RECTAL | Status: DC | PRN

## 2011-02-11 MED ORDER — ONDANSETRON HCL 4 MG PO TABS: 4.0000 mg | ORAL_TABLET | Freq: Four times a day (QID) | ORAL | Status: DC | PRN

## 2011-02-11 MED ORDER — PHENYLEPHRINE HCL 10 MG/ML IJ SOLN
INTRAMUSCULAR | Status: DC | PRN
  Administered 2011-02-11 (×2): 20 ug via INTRAVENOUS

## 2011-02-11 MED ORDER — CEFAZOLIN SODIUM-DEXTROSE 2-3 GM-% IV SOLR
2.0000 g | Freq: Four times a day (QID) | INTRAVENOUS | Status: AC
  Administered 2011-02-11 – 2011-02-12 (×3): 2 g via INTRAVENOUS
  Filled 2011-02-11 (×3): qty 50

## 2011-02-11 MED ORDER — CISATRACURIUM BESYLATE 2 MG/ML IV SOLN
INTRAVENOUS | Status: DC | PRN
  Administered 2011-02-11: 8 mg via INTRAVENOUS

## 2011-02-11 MED ORDER — WARFARIN SODIUM 7.5 MG PO TABS
7.5000 mg | ORAL_TABLET | Freq: Once | ORAL | Status: AC
  Administered 2011-02-11: 7.5 mg via ORAL
  Filled 2011-02-11: qty 1

## 2011-02-11 MED ORDER — LACTATED RINGERS IV SOLN: INTRAVENOUS | Status: DC

## 2011-02-11 MED ORDER — WARFARIN SODIUM 5 MG PO TABS: 5.0000 mg | ORAL_TABLET | Freq: Every evening | ORAL | Status: DC

## 2011-02-11 MED ORDER — GLIPIZIDE 5 MG PO TABS
5.0000 mg | ORAL_TABLET | Freq: Two times a day (BID) | ORAL | Status: DC
  Administered 2011-02-12 – 2011-02-14 (×5): 5 mg via ORAL
  Filled 2011-02-11 (×6): qty 1

## 2011-02-11 MED ORDER — FERROUS SULFATE 325 (65 FE) MG PO TABS
325.0000 mg | ORAL_TABLET | Freq: Three times a day (TID) | ORAL | Status: DC
  Administered 2011-02-12 – 2011-02-14 (×7): 325 mg via ORAL
  Filled 2011-02-11 (×10): qty 1

## 2011-02-11 MED ORDER — ENOXAPARIN SODIUM 30 MG/0.3ML ~~LOC~~ SOLN
30.0000 mg | Freq: Two times a day (BID) | SUBCUTANEOUS | Status: DC
  Administered 2011-02-12 – 2011-02-14 (×5): 30 mg via SUBCUTANEOUS
  Filled 2011-02-11 (×7): qty 0.3

## 2011-02-11 MED ORDER — POLYVINYL ALCOHOL 1.4 % OP SOLN
1.0000 [drp] | Freq: Every day | OPHTHALMIC | Status: DC | PRN
  Filled 2011-02-11: qty 15

## 2011-02-11 MED ORDER — METOCLOPRAMIDE HCL 10 MG PO TABS: 5.0000 mg | ORAL_TABLET | Freq: Three times a day (TID) | ORAL | Status: DC | PRN

## 2011-02-11 MED ORDER — POTASSIUM CHLORIDE IN NACL 20-0.9 MEQ/L-% IV SOLN
INTRAVENOUS | Status: DC
  Administered 2011-02-11 – 2011-02-13 (×4): via INTRAVENOUS
  Filled 2011-02-11 (×4): qty 1000

## 2011-02-11 MED ORDER — LISINOPRIL 20 MG PO TABS
20.0000 mg | ORAL_TABLET | Freq: Every day | ORAL | Status: DC
  Administered 2011-02-12 – 2011-02-14 (×2): 20 mg via ORAL
  Filled 2011-02-11 (×3): qty 1

## 2011-02-11 MED ORDER — ZOLPIDEM TARTRATE 5 MG PO TABS: 5.0000 mg | ORAL_TABLET | Freq: Every evening | ORAL | Status: DC | PRN

## 2011-02-11 MED ORDER — PREDNISONE 5 MG PO TABS
5.0000 mg | ORAL_TABLET | ORAL | Status: DC
  Administered 2011-02-12 – 2011-02-14 (×3): 5 mg via ORAL
  Filled 2011-02-11 (×5): qty 1

## 2011-02-11 MED ORDER — FENTANYL CITRATE 0.05 MG/ML IJ SOLN
INTRAMUSCULAR | Status: DC | PRN
  Administered 2011-02-11 (×2): 50 ug via INTRAVENOUS
  Administered 2011-02-11: 100 ug via INTRAVENOUS

## 2011-02-11 MED ORDER — SUCCINYLCHOLINE CHLORIDE 20 MG/ML IJ SOLN
INTRAMUSCULAR | Status: DC | PRN
  Administered 2011-02-11: 100 mg via INTRAVENOUS

## 2011-02-11 MED ORDER — PHENOL 1.4 % MT LIQD: 1.0000 | OROMUCOSAL | Status: DC | PRN

## 2011-02-11 MED ORDER — HYDROCHLOROTHIAZIDE 10 MG/ML ORAL SUSPENSION
6.2500 mg | Freq: Every day | ORAL | Status: DC
  Filled 2011-02-11: qty 1.25

## 2011-02-11 MED ORDER — HYDROCHLOROTHIAZIDE 10 MG/ML ORAL SUSPENSION: 6.2500 mg | Freq: Every day | ORAL | Status: DC

## 2011-02-11 MED ORDER — ONDANSETRON HCL 4 MG/2ML IJ SOLN
INTRAMUSCULAR | Status: DC | PRN
  Administered 2011-02-11 (×2): 2 mg via INTRAVENOUS

## 2011-02-11 MED ORDER — FLEET ENEMA 7-19 GM/118ML RE ENEM: 1.0000 | ENEMA | Freq: Once | RECTAL | Status: AC | PRN

## 2011-02-11 MED ORDER — LACTATED RINGERS IV SOLN: INTRAVENOUS | Status: DC | PRN

## 2011-02-11 MED ORDER — HYDROMORPHONE HCL PF 1 MG/ML IJ SOLN
0.5000 mg | INTRAMUSCULAR | Status: DC | PRN
  Administered 2011-02-11: 0.5 mg via INTRAVENOUS
  Administered 2011-02-11: 1 mg via INTRAVENOUS
  Administered 2011-02-11: 0.5 mg via INTRAVENOUS
  Administered 2011-02-12: 1 mg via INTRAVENOUS
  Filled 2011-02-11 (×4): qty 1

## 2011-02-11 MED ORDER — CARBOXYMETHYLCELLULOSE SODIUM 1 % OP SOLN
1.0000 [drp] | Freq: Every day | OPHTHALMIC | Status: DC | PRN
Start: 2011-02-11 — End: 2011-02-11

## 2011-02-11 MED ORDER — MENTHOL 3 MG MT LOZG: 1.0000 | LOZENGE | OROMUCOSAL | Status: DC | PRN

## 2011-02-11 MED ORDER — HYDROMORPHONE HCL PF 1 MG/ML IJ SOLN
INTRAMUSCULAR | Status: DC | PRN
  Administered 2011-02-11: 0.5 mg via INTRAVENOUS
  Administered 2011-02-11 (×2): .5 mg via INTRAVENOUS

## 2011-02-11 MED ORDER — POVIDONE-IODINE 7.5 % EX SOLN: Freq: Once | CUTANEOUS | Status: DC

## 2011-02-11 MED ORDER — ACETAMINOPHEN 325 MG PO TABS: 650.0000 mg | ORAL_TABLET | Freq: Four times a day (QID) | ORAL | Status: DC | PRN

## 2011-02-11 MED ORDER — GLYCOPYRROLATE 0.2 MG/ML IJ SOLN
INTRAMUSCULAR | Status: DC | PRN
  Administered 2011-02-11: .4 mg via INTRAVENOUS

## 2011-02-11 MED ORDER — HYDROMORPHONE HCL PF 1 MG/ML IJ SOLN
0.2500 mg | INTRAMUSCULAR | Status: DC | PRN
  Administered 2011-02-11 (×4): 0.5 mg via INTRAVENOUS

## 2011-02-11 MED ORDER — LACTATED RINGERS IV SOLN
INTRAVENOUS | Status: DC | PRN
  Administered 2011-02-11: 10:00:00 via INTRAVENOUS

## 2011-02-11 SURGICAL SUPPLY — 65 items
BAG ZIPLOCK 12X15 (MISCELLANEOUS) ×4 IMPLANT
BANDAGE ACE 4 STERILE (GAUZE/BANDAGES/DRESSINGS) ×2 IMPLANT
BANDAGE ELASTIC 4 VELCRO ST LF (GAUZE/BANDAGES/DRESSINGS) ×2 IMPLANT
BANDAGE ELASTIC 6 VELCRO ST LF (GAUZE/BANDAGES/DRESSINGS) ×2 IMPLANT
BANDAGE ESMARK 6X9 LF (GAUZE/BANDAGES/DRESSINGS) ×1 IMPLANT
BANDAGE GAUZE ELAST BULKY 4 IN (GAUZE/BANDAGES/DRESSINGS) ×2 IMPLANT
BLADE SAG 18X100X1.27 (BLADE) ×2 IMPLANT
BLADE SAW SGTL 13.0X1.19X90.0M (BLADE) ×2 IMPLANT
BNDG ESMARK 6X9 LF (GAUZE/BANDAGES/DRESSINGS) ×2
CEMENT HV SMART SET (Cement) ×4 IMPLANT
CLOTH BEACON ORANGE TIMEOUT ST (SAFETY) ×2 IMPLANT
CUFF TOURN SGL QUICK 34 (TOURNIQUET CUFF) ×1
CUFF TRNQT CYL 34X4X40X1 (TOURNIQUET CUFF) ×1 IMPLANT
DRAPE EXTREMITY T 121X128X90 (DRAPE) ×2 IMPLANT
DRAPE LG THREE QUARTER DISP (DRAPES) ×2 IMPLANT
DRAPE POUCH INSTRU U-SHP 10X18 (DRAPES) ×2 IMPLANT
DRAPE U-SHAPE 47X51 STRL (DRAPES) ×2 IMPLANT
DRSG PAD ABDOMINAL 8X10 ST (GAUZE/BANDAGES/DRESSINGS) ×2 IMPLANT
DURAPREP 26ML APPLICATOR (WOUND CARE) ×2 IMPLANT
ELECT REM PT RETURN 9FT ADLT (ELECTROSURGICAL) ×2
ELECTRODE REM PT RTRN 9FT ADLT (ELECTROSURGICAL) ×1 IMPLANT
EVACUATOR 1/8 PVC DRAIN (DRAIN) ×2 IMPLANT
FACESHIELD LNG OPTICON STERILE (SAFETY) ×10 IMPLANT
GAUZE SPONGE 4X4 12PLY STRL LF (GAUZE/BANDAGES/DRESSINGS) ×2 IMPLANT
GAUZE XEROFORM 2X2 STRL (GAUZE/BANDAGES/DRESSINGS) ×2 IMPLANT
GLOVE ECLIPSE 8.0 STRL XLNG CF (GLOVE) ×2 IMPLANT
GLOVE SURG ORTHO 8.0 STRL STRW (GLOVE) ×2 IMPLANT
GLOVE SURG ORTHO 9.0 STRL STRW (GLOVE) ×2 IMPLANT
GLOVE SURG SS PI 7.5 STRL IVOR (GLOVE) ×10 IMPLANT
GOWN PREVENTION PLUS XLARGE (GOWN DISPOSABLE) ×6 IMPLANT
GOWN STRL NON-REIN LRG LVL3 (GOWN DISPOSABLE) ×2 IMPLANT
GOWN STRL REIN XL XLG (GOWN DISPOSABLE) ×2 IMPLANT
HANDPIECE INTERPULSE COAX TIP (DISPOSABLE) ×1
IMMOBILIZER KNEE 20 (SOFTGOODS)
IMMOBILIZER KNEE 20 THIGH 36 (SOFTGOODS) IMPLANT
IMMOBILIZER KNEE 22 UNIV (SOFTGOODS) ×2 IMPLANT
KIT BASIN OR (CUSTOM PROCEDURE TRAY) ×2 IMPLANT
NS IRRIG 1000ML POUR BTL (IV SOLUTION) ×2 IMPLANT
PACK TOTAL JOINT (CUSTOM PROCEDURE TRAY) ×2 IMPLANT
PENCIL BUTTON HOLSTER BLD 10FT (ELECTRODE) IMPLANT
POSITIONER SURGICAL ARM (MISCELLANEOUS) ×2 IMPLANT
SET HNDPC FAN SPRY TIP SCT (DISPOSABLE) ×1 IMPLANT
SET PAD KNEE POSITIONER (MISCELLANEOUS) ×2 IMPLANT
SPONGE GAUZE 4X4 12PLY (GAUZE/BANDAGES/DRESSINGS) ×2 IMPLANT
SPONGE LAP 18X18 X RAY DECT (DISPOSABLE) IMPLANT
SPONGE SURGIFOAM ABS GEL 100 (HEMOSTASIS) ×2 IMPLANT
STOCKINETTE 6  STRL (DRAPES) ×1
STOCKINETTE 6 STRL (DRAPES) ×1 IMPLANT
STRIP CLOSURE SKIN 1/2X4 (GAUZE/BANDAGES/DRESSINGS) ×4 IMPLANT
SUCTION FRAZIER 12FR DISP (SUCTIONS) ×2 IMPLANT
SUT BONE WAX W31G (SUTURE) ×2 IMPLANT
SUT MNCRL AB 3-0 PS2 18 (SUTURE) ×2 IMPLANT
SUT VIC AB 0 CT1 27 (SUTURE) ×2
SUT VIC AB 0 CT1 27XBRD ANTBC (SUTURE) ×2 IMPLANT
SUT VIC AB 1 CT1 27 (SUTURE) ×7
SUT VIC AB 1 CT1 27XBRD ANTBC (SUTURE) ×7 IMPLANT
SUT VIC AB 2-0 CT1 27 (SUTURE) ×2
SUT VIC AB 2-0 CT1 TAPERPNT 27 (SUTURE) ×2 IMPLANT
TAPE STRIPS DRAPE STRL (GAUZE/BANDAGES/DRESSINGS) ×2 IMPLANT
TOWEL OR 17X26 10 PK STRL BLUE (TOWEL DISPOSABLE) ×6 IMPLANT
TOWER CARTRIDGE SMART MIX (DISPOSABLE) ×2 IMPLANT
TRAY FOLEY CATH 14FRSI W/METER (CATHETERS) ×2 IMPLANT
WATER STERILE IRR 1500ML POUR (IV SOLUTION) ×2 IMPLANT
WRAP KNEE MAXI GEL POST OP (GAUZE/BANDAGES/DRESSINGS) ×4 IMPLANT
YANKAUER SUCT BULB TIP 10FT TU (MISCELLANEOUS) IMPLANT

## 2011-02-11 NOTE — Anesthesia Postprocedure Evaluation (Signed)
  Anesthesia Post-op Note  Patient: Don Hall  Procedure(s) Performed:  TOTAL KNEE ARTHROPLASTY - right total knee arthroplasty  Patient Location: PACU  Anesthesia Type: General  Level of Consciousness: awake and alert   Airway and Oxygen Therapy: Patient Spontanous Breathing  Post-op Pain: mild  Post-op Assessment: Post-op Vital signs reviewed, Patient's Cardiovascular Status Stable, Respiratory Function Stable, Patent Airway and No signs of Nausea or vomiting  Post-op Vital Signs: stable  Complications: No apparent anesthesia complications

## 2011-02-11 NOTE — Progress Notes (Signed)
Patient states he has nausea and vomiting post op

## 2011-02-11 NOTE — Transfer of Care (Signed)
Immediate Anesthesia Transfer of Care Note  Patient: Don Hall  Procedure(s) Performed:  TOTAL KNEE ARTHROPLASTY - right total knee arthroplasty  Patient Location: PACU  Anesthesia Type: General  Level of Consciousness: awake and alert   Airway & Oxygen Therapy: Patient Spontanous Breathing and Patient connected to face mask  Post-op Assessment: Report given to PACU RN and Post -op Vital signs reviewed and stable  Post vital signs: Reviewed and stable  Complications: No apparent anesthesia complications

## 2011-02-11 NOTE — Anesthesia Preprocedure Evaluation (Signed)
Anesthesia Evaluation  Patient identified by MRN, date of birth, ID band Patient awake    Reviewed: Allergy & Precautions, H&P , NPO status , Patient's Chart, lab work & pertinent test results  History of Anesthesia Complications (+) PONV and Family history of anesthesia reaction  Airway Mallampati: II TM Distance: >3 FB Neck ROM: Full    Dental No notable dental hx. (+) Caps   Pulmonary neg pulmonary ROS,  clear to auscultation  Pulmonary exam normal       Cardiovascular hypertension, Pt. on medications neg cardio ROS + dysrhythmias Atrial Fibrillation Regular Normal    Neuro/Psych Negative Neurological ROS  Negative Psych ROS   GI/Hepatic negative GI ROS, Neg liver ROS,   Endo/Other  Negative Endocrine ROSDiabetes mellitus-, Type 2, Oral Hypoglycemic Agents  Renal/GU negative Renal ROS  Genitourinary negative   Musculoskeletal negative musculoskeletal ROS (+)   Abdominal   Peds negative pediatric ROS (+)  Hematology negative hematology ROS (+)   Anesthesia Other Findings   Reproductive/Obstetrics negative OB ROS                           Anesthesia Physical Anesthesia Plan  ASA: III  Anesthesia Plan: General   Post-op Pain Management:    Induction: Intravenous  Airway Management Planned: Oral ETT  Additional Equipment:   Intra-op Plan:   Post-operative Plan: Extubation in OR  Informed Consent: I have reviewed the patients History and Physical, chart, labs and discussed the procedure including the risks, benefits and alternatives for the proposed anesthesia with the patient or authorized representative who has indicated his/her understanding and acceptance.   Dental advisory given  Plan Discussed with: CRNA  Anesthesia Plan Comments: (Patient had a history of a bad experience with spinal anesthesia.  He wants general and he also refuses a block for postoperative analgesia.   He had his other knee replaced 3 years ago under just general anesthesia and did well.)        Anesthesia Quick Evaluation

## 2011-02-11 NOTE — Op Note (Signed)
DATE OF SURGERY:  02/11/2011  TIME: 12:20 PM  PATIENT NAME:  Don Hall    AGE: 77 y.o.   PRE-OPERATIVE DIAGNOSIS:  End stage bone on bone osteoarthritis of the Right Knee  POST-OPERATIVE DIAGNOSIS:  End stage bone on bone osteoarthritis of the Right Knee  PROCEDURE:  Procedure(s): TOTAL KNEE ARTHROPLASTY  SURGEON:  Elisandro Jarrett ANDREW  ASSISTANT:  Rutherford Limerick, PA-C, present and scrubbed throughout the case, critical for assistance with exposure, retraction, instrumentation, and closure.  OPERATIVE IMPLANTS: Depuy PFC Sigma Rotating Platform.  Femur size 5, Tibia size 6, Patella siz 41 3-peg oval button, with a 10  mm polyethylene insert.   PREOPERATIVE INDICATIONS:   Don Hall is a 77 y.o. year old male with end stage bone on bone arthritis of the knee who failed conservative treatment and elected for Total Knee Arthroplasty.   The risks, benefits, and alternatives were discussed at length including but not limited to the risks of infection, bleeding, nerve injury, stiffness, blood clots, the need for revision surgery, cardiopulmonary complications, among others, and they were willing to proceed.  OPERATIVE DESCRIPTION:  The patient was brought to the operative room and placed in a supine position.  Spinal anesthesia was administered.  IV antibiotics were given.  The lower extremity was prepped and draped in the usual sterile fashion.  Time out was performed.  The leg was elevated and exsanguinated and the tourniquet was inflated.  Anterior quadriceps tendon splitting approach was performed.  The patella was retracted and osteophytes were removed.  The anterior horn of the medial and lateral meniscus was removed and cruciate ligaments resected.   The distal femur was opened with the drill and the intramedullary distal femoral cutting jig was utilized, set at 5 degrees resecting 10 mm off the distal femur.  Care was taken to protect the collateral ligaments.  The  distal femoral sizing jig was applied, taking care to avoid notching.  Then the 4-in-1 cutting jig was applied and the anterior and posterior femur was cut, along with the chamfer cuts.    Then the extramedullary tibial cutting jig was utilized making the appropriate cut using the anterior tibial crest as a reference building in appropriate posterior slope.  Care was taken during the cut to protect the medial and collateral ligaments.  The proximal tibia was removed along with the posterior horns of the menisci.   The posterior medial femoral osteophytes and posterior lateral femoral osteophytes were removed.    The flexion gap was then measured and was symmetric with the extension gap, measured at 10.  I completed the distal femoral preparation using the appropriate jig to prepare the box.  The patella was then measured, and cut with the saw.    The proximal tibia sized and prepared accordingly with the reamer and the punch, and then all components were trialed with the trial insert.  The knee was found to have excellent balance and full motion.    The above named components were then cemented into place and all excess cement was removed.  The trial polyethylene component was in place during cementation, and then was exchanged for the real polyethylene component.    The knee was easily taken through a range of motion and the patella tracked well and the knee irrigated copiously and the parapatellar and subcutaneous tissue closed with vicryl, and monocryl with steri strips for the skin.  The arthrotomy was closed at 90 of flexion. The wounds were dressed with sterile gauze  and the tourniquet released and the patient was awakened and returned to the PACU in stable and satisfactory condition.  There were no complications.  Total tourniquet time was 85 minutes.                 DATE OF SURGERY:  02/11/2011  TIME: 12:20 PM  PATIENT NAME:  Don Hall    AGE: 77 y.o.    PRE-OPERATIVE DIAGNOSIS:  End stage bone on bone osteoarthritis of the Right Knee  POST-OPERATIVE DIAGNOSIS:  End stage bone on bone osteoarthritis of the Right Knee  PROCEDURE:  Procedure(s): TOTAL KNEE ARTHROPLASTY  SURGEON:  Kenith Trickel ANDREW  ASSISTANT:  Rutherford Limerick, PA-C, present and scrubbed throughout the case, critical for assistance with exposure, retraction, instrumentation, and closure.  OPERATIVE IMPLANTS: Depuy PFC Sigma Rotating Platform.  Femur size 5, Tibia size 6, Patella size 41 3-peg oval button, with a 10 mm polyethylene insert.   PREOPERATIVE INDICATIONS:   Don Hall is a 77 y.o. year old male with end stage bone on bone arthritis of the knee who failed conservative treatment and elected for Total Knee Arthroplasty.   The risks, benefits, and alternatives were discussed at length including but not limited to the risks of infection, bleeding, nerve injury, stiffness, blood clots, the need for revision surgery, cardiopulmonary complications, among others, and they were willing to proceed.  OPERATIVE DESCRIPTION:  The patient was brought to the operative room and placed in a supine position.  Spinal anesthesia was administered.  IV antibiotics were given.  The lower extremity was prepped and draped in the usual sterile fashion.  Time out was performed.  The leg was elevated and exsanguinated and the tourniquet was inflated.  Anterior quadriceps tendon splitting approach was performed.  The patella was retracted and osteophytes were removed.  The anterior horn of the medial and lateral meniscus was removed and cruciate ligaments resected.   The distal femur was opened with the drill and the intramedullary distal femoral cutting jig was utilized, set at 5 degrees resecting 10 mm off the distal femur.  Care was taken to protect the collateral ligaments.  The distal femoral sizing jig was applied, taking care to avoid notching.  Then the 4-in-1 cutting jig was  applied and the anterior and posterior femur was cut, along with the chamfer cuts.    Then the extramedullary tibial cutting jig was utilized making the appropriate cut using the anterior tibial crest as a reference building in appropriate posterior slope.  Care was taken during the cut to protect the medial and collateral ligaments.  The proximal tibia was removed along with the posterior horns of the menisci.   The posterior medial femoral osteophytes and posterior lateral femoral osteophytes were removed.    The flexion gap was then measured and was symmetric with the extension gap, measured at 10  I completed the distal femoral preparation using the appropriate jig to prepare the box.  The patella was then measured, and cut with the saw.    The proximal tibia sized and prepared accordingly with the reamer and the punch, and then all components were trialed with the trial insert.  The knee was found to have excellent balance and full motion.    The above named components were then cemented into place and all excess cement was removed.  The trial polyethylene component was in place during cementation, and then was exchanged for the real polyethylene component.    The knee was easily taken through  a range of motion and the patella tracked well and the knee irrigated copiously and the parapatellar and subcutaneous tissue closed with vicryl, and monocryl with steri strips for the skin.  The arthrotomy was closed at 90 of flexion. The wounds were dressed with sterile gauze and the tourniquet released and the patient was awakened and returned to the PACU in stable and satisfactory condition.  There were no complications.  Total tourniquet time was 85 minutes.
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
## 2011-02-12 LAB — CBC
HCT: 34.6 % — ABNORMAL LOW (ref 39.0–52.0)
Hemoglobin: 11.3 g/dL — ABNORMAL LOW (ref 13.0–17.0)
RBC: 3.65 MIL/uL — ABNORMAL LOW (ref 4.22–5.81)
RDW: 13.6 % (ref 11.5–15.5)
WBC: 13.7 10*3/uL — ABNORMAL HIGH (ref 4.0–10.5)

## 2011-02-12 LAB — BASIC METABOLIC PANEL
BUN: 30 mg/dL — ABNORMAL HIGH (ref 6–23)
CO2: 27 mEq/L (ref 19–32)
Chloride: 102 mEq/L (ref 96–112)
GFR calc Af Amer: 49 mL/min — ABNORMAL LOW (ref >90)
Glucose, Bld: 186 mg/dL — ABNORMAL HIGH (ref 70–99)
Potassium: 4.9 mEq/L (ref 3.5–5.1)

## 2011-02-12 LAB — PROTIME-INR: INR: 1.18 (ref 0.00–1.49)

## 2011-02-12 MED ORDER — INSULIN ASPART 100 UNIT/ML ~~LOC~~ SOLN
0.0000 [IU] | Freq: Three times a day (TID) | SUBCUTANEOUS | Status: DC
  Administered 2011-02-12: 3 [IU] via SUBCUTANEOUS
  Administered 2011-02-12: 5 [IU] via SUBCUTANEOUS
  Administered 2011-02-12 – 2011-02-13 (×2): 3 [IU] via SUBCUTANEOUS
  Filled 2011-02-12: qty 3

## 2011-02-12 MED ORDER — LISINOPRIL 10 MG PO TABS
10.0000 mg | ORAL_TABLET | Freq: Every day | ORAL | Status: DC
  Administered 2011-02-12 – 2011-02-13 (×2): 10 mg via ORAL
  Filled 2011-02-12 (×3): qty 1

## 2011-02-12 MED ORDER — INSULIN ASPART 100 UNIT/ML ~~LOC~~ SOLN: 0.0000 [IU] | Freq: Every day | SUBCUTANEOUS | Status: DC

## 2011-02-12 MED ORDER — WARFARIN SODIUM 7.5 MG PO TABS
7.5000 mg | ORAL_TABLET | Freq: Once | ORAL | Status: AC
  Administered 2011-02-12: 7.5 mg via ORAL
  Filled 2011-02-12: qty 1
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
## 2011-02-12 NOTE — Progress Notes (Signed)
Physical Therapy Treatment Patient Details Name: Don Hall MRN: [id number] DOB: 10-29-1927 Today's Date: 02/12/2011 1516 - 23 GT PT Assessment/Plan  PT - Assessment/Plan PT Plan: Discharge plan remains appropriate PT Frequency: 7X/week Follow Up Recommendations: Home health PT Equipment Recommended: None recommended by PT PT Goals  Acute Rehab PT Goals PT Goal Formulation: With patient Time For Goal Achievement: 7 days Pt will go Supine/Side to Sit: with supervision PT Goal: Supine/Side to Sit - Progress: Progressing toward goal Pt will go Sit to Supine/Side: with supervision PT Goal: Sit to Supine/Side - Progress: Progressing toward goal Pt will go Sit to Stand: with supervision PT Goal: Sit to Stand - Progress: Progressing toward goal Pt will go Stand to Sit: with supervision PT Goal: Stand to Sit - Progress: Progressing toward goal Pt will Ambulate: 51 - 150 feet;with supervision;with rolling walker PT Goal: Ambulate - Progress: Progressing toward goal Pt will Go Up / Down Stairs: 1-2 stairs;with min assist;with least restrictive assistive device PT Goal: Up/Down Stairs - Progress: Not met  PT Treatment Precautions/Restrictions  Precautions Precautions: Knee Required Braces or Orthoses: Yes Knee Immobilizer: Discontinue once straight leg raise with < 10 degree lag Restrictions Other Position/Activity Restrictions: WBAT Mobility (including Balance) Bed Mobility Bed Mobility: Yes Supine to Sit: 1: +2 Total assist (pt 60%) Supine to Sit Details (indicate cue type and reason): cues for sequence/technique Sit to Supine - Right: 1: +2 Total assist Sit to Supine - Right Details (indicate cue type and reason): cues for technique and R LE management Transfers Transfers: Yes Sit to Stand: 1: +2 Total assist;With armrests;From chair/3-in-1 (pt 70%) Sit to Stand Details (indicate cue type and reason): cues for use of UEs and for LE position Stand to Sit: To bed;With  upper extremity assist;1: +2 Total assist Stand to Sit Details: cues for use of UEs and for LE position Ambulation/Gait Ambulation/Gait: Yes Ambulation/Gait Assistance: 1: +2 Total assist Ambulation/Gait Assistance Details (indicate cue type and reason): cues for posture, increased UE WB, position from RW, and sequence Ambulation Distance (Feet): 16 Feet Assistive device: Rolling walker Gait Pattern: Step-to pattern    Exercise  Total Joint Exercises Ankle Circles/Pumps: AROM;10 reps;Supine;Both Quad Sets: AROM;Both;10 reps;Supine Heel Slides: AAROM;10 reps;Supine;Right Straight Leg Raises: AAROM;10 reps;Supine;Right End of Session PT - End of Session Equipment Utilized During Treatment: Gait belt Activity Tolerance: Patient limited by fatigue;Patient limited by pain;Other (comment) Patient left: in bed;with call bell in reach;with family/visitor present General Behavior During Session: [facility] for tasks performed Cognition: [facility] for tasks performed  Don Hall 02/12/2011, 5:14 PM

## 2011-02-12 NOTE — Progress Notes (Signed)
Subjective: Patient reports a little more showed this morning when sitting up otherwise doing well pain well controlled no shortness of breath chest pain or abdominal discomfort   Objective: Vital signs in last 24 hours: Temp:  [97.3 F (36.3 C)-98.1 F (36.7 C)] 98.1 F (36.7 C) (12/15 0519) Pulse Rate:  [74-89] 84  (12/15 0519) Resp:  [8-20] 20  (12/15 0519) BP: (122-151)/(47-72) 144/67 mmHg (12/15 0519) SpO2:  [97 %-100 %] 97 % (12/15 0519) Weight:  [101.606 kg (224 lb)] 224 lb (101.606 kg) (12/14 1520)  Intake/Output from previous day: 12/14 0701 - 12/15 0700 In: 2507.5 [P.O.:240; I.V.:2067.5; IV Piggyback:100] Out: 1975 [Urine:1300; Drains:600; Blood:75] Intake/Output this shift:     Basename 02/12/11 0420  HGB 11.3*    Basename 02/12/11 0420  WBC 13.7*  RBC 3.65*  HCT 34.6*  PLT 211    Basename 02/12/11 0420  NA 138  K 4.9  CL 102  CO2 27  BUN 30*  CREATININE 1.49*  GLUCOSE 186*  CALCIUM 8.9    Basename 02/12/11 0420 02/11/11 0835  LABPT -- --  INR 1.18 1.13    Patient's conscious alert and appropriate appears to be very comfortable sitting up in bed with her son in the room. Patient's lungs were clear throughout heart was regular rate and rhythm abdomen was soft nontender bowel sounds present Right knee dressing was intact calf and thigh is soft nontender foot neuromotor vascularly intact his Hemovac drain was DC'd intact  Assessment/Plan: Impression postop day #1 status post right total knee arthroplasty doing well History of blood clots bridging with Lovenox re\re starting Coumadin therapy per pharmacy History of atrial fibrillation curre will hold hydrochlorothiazide nt heart rate is a fairly regular Plan out of bed with physical therapy per total knee protocol Will decrease IV hydration will hold hydrochlorothiazide with sodium stable recheck labs in a.m.  Evert Kohl 02/12/2011, 8:34 AM

## 2011-02-13 LAB — GLUCOSE, CAPILLARY
Glucose-Capillary: 117 mg/dL — ABNORMAL HIGH (ref 70–99)
Glucose-Capillary: 166 mg/dL — ABNORMAL HIGH (ref 70–99)

## 2011-02-13 LAB — BASIC METABOLIC PANEL
BUN: 29 mg/dL — ABNORMAL HIGH (ref 6–23)
CO2: 25 mEq/L (ref 19–32)
Chloride: 100 mEq/L (ref 96–112)
Creatinine, Ser: 1.74 mg/dL — ABNORMAL HIGH (ref 0.50–1.35)

## 2011-02-13 LAB — CBC
HCT: 30.5 % — ABNORMAL LOW (ref 39.0–52.0)
MCV: 94.7 fL (ref 78.0–100.0)
RBC: 3.22 MIL/uL — ABNORMAL LOW (ref 4.22–5.81)
WBC: 14 10*3/uL — ABNORMAL HIGH (ref 4.0–10.5)

## 2011-02-13 MED ORDER — WARFARIN SODIUM 5 MG PO TABS
5.0000 mg | ORAL_TABLET | Freq: Once | ORAL | Status: AC
  Administered 2011-02-13: 5 mg via ORAL
  Filled 2011-02-13: qty 1

## 2011-02-13 NOTE — Progress Notes (Signed)
Physical Therapy Treatment Patient Details Name: Don Hall MRN: [id number] DOB: 1927-06-28 Today's Date: 02/13/2011 1100 - 23 GT, TE PT Assessment/Plan  PT - Assessment/Plan PT Plan: Discharge plan remains appropriate PT Frequency: 7X/week Follow Up Recommendations: Home health PT Equipment Recommended: None recommended by OT PT Goals  Acute Rehab PT Goals Time For Goal Achievement: 7 days Pt will go Sit to Stand: with supervision PT Goal: Sit to Stand - Progress: Progressing toward goal Pt will go Stand to Sit: with supervision PT Goal: Stand to Sit - Progress: Progressing toward goal Pt will Ambulate: 51 - 150 feet;with supervision;with rolling walker PT Goal: Ambulate - Progress: Progressing toward goal  PT Treatment Precautions/Restrictions  Precautions Precautions: Knee Required Braces or Orthoses: Yes Knee Immobilizer: Discontinue once straight leg raise with < 10 degree lag Restrictions Weight Bearing Restrictions: Yes RLE Weight Bearing: Weight bearing as tolerated Other Position/Activity Restrictions: WBAT Mobility (including Balance) Bed Mobility Bed Mobility: Yes Supine to Sit: 1: +2 Total assist;With rails;Patient percentage (comment) Supine to Sit Details (indicate cue type and reason): Pt assist R LE and UE's to use elbows to assist to sitting position & VC's for hand placement (pt approx 75%). Transfers Sit to Stand: 3: Mod assist;With upper extremity assist;With armrests;From chair/3-in-1 Sit to Stand Details (indicate cue type and reason): cues for use of UEs and for LE position Stand to Sit: 3: Mod assist;With upper extremity assist;With armrests;To chair/3-in-1 Stand to Sit Details: cues for use of UEs and for LE position Ambulation/Gait Ambulation/Gait Assistance: 3: Mod assist Ambulation/Gait Assistance Details (indicate cue type and reason): cues for posture, sequence and position from RW Ambulation Distance (Feet): 64 Feet Assistive device:  Rolling walker Gait Pattern: Step-to pattern    Exercise  Total Joint Exercises Ankle Circles/Pumps: AROM;20 reps;Both;Supine Quad Sets: AROM;Both;20 reps;Supine Heel Slides: AAROM;20 reps;Supine;Right Straight Leg Raises: AAROM;20 reps;Supine;Right End of Session PT - End of Session Activity Tolerance: Patient tolerated treatment well Patient left: in chair;with call bell in reach;with family/visitor present General Behavior During Session: [facility] for tasks performed Cognition: [facility] for tasks performed  Don Hall 02/13/2011, 12:19 PM

## 2011-02-13 NOTE — Progress Notes (Signed)
Subjective: 2 Days Post-Op Procedure(s) (LRB): TOTAL KNEE ARTHROPLASTY (Right) Patient reports pain as mild.   Patient seen in rounds with Dr. Gladstone Lighter. Patient has no complaints except for mild pain. No shortness of breath. Therapy progressing well. Probable discharge tomorrow after therapy.   Objective: Vital signs in last 24 hours: Temp:  [97.6 F (36.4 C)-98.8 F (37.1 C)] 97.6 F (36.4 C) (12/16 0520) Pulse Rate:  [62-94] 62  (12/16 0520) Resp:  [16-20] 16  (12/16 0520) BP: (115-168)/(61-77) 119/62 mmHg (12/16 0520) SpO2:  [90 %-99 %] 90 % (12/16 0520)  Intake/Output from previous day:  Intake/Output Summary (Last 24 hours) at 02/13/11 0807 Last data filed at 02/13/11 0520  Gross per 24 hour  Intake 3528.75 ml  Output    600 ml  Net 2928.75 ml    Intake/Output this shift:    Labs:  Basename 02/13/11 0507 02/12/11 0420  HGB 10.1* 11.3*    Basename 02/13/11 0507 02/12/11 0420  WBC 14.0* 13.7*  RBC 3.22* 3.65*  HCT 30.5* 34.6*  PLT 179 211    Basename 02/13/11 0507 02/12/11 0420  NA 133* 138  K 4.3 4.9  CL 100 102  CO2 25 27  BUN 29* 30*  CREATININE 1.74* 1.49*  GLUCOSE 80 186*  CALCIUM 8.7 8.9    Basename 02/13/11 0507 02/12/11 0420  LABPT -- --  INR 1.74* 1.18    Exam - Neurovascular intact Sensation intact distally Dorsiflexion/Plantar flexion intact Dressing/Incision - clean, dry, no drainage Motor function intact - moving foot and toes well on exam.   Assessment/Plan: 2 Days Post-Op Procedure(s) (LRB): TOTAL KNEE ARTHROPLASTY (Right)  Past Medical History  Diagnosis Date  . Hypertension   . Hyperlipidemia   . Type 2 diabetes mellitus   . History of DVT of lower extremity     recurrent  . Hemorrhage Feb 20 2010    spontaneous hemorrhage of right thigh with possible over anticoagulation  . Atrial fibrillation   . Paroxysmal atrial fibrillation     CHADS2 SCORE 3  . GERD (gastroesophageal reflux disease)   . Diabetes mellitus    niddm  . Arthritis   . PONV (postoperative nausea and vomiting)     Up with therapy D/C IV fluids Plan for discharge tomorrow Discharge home with home health when goals are met  DVT Prophylaxis - Coumadin Protocol Weight-Bearing as tolerated to right leg  Bartlomiej Jenkinson LAUREN 02/13/2011, 8:07 AM

## 2011-02-13 NOTE — Progress Notes (Signed)
ANTICOAGULATION CONSULT NOTE - Follow Up  Pharmacy Consult for warfarin Indication: Afib, recurrent DVT's, s/p R TKA  No Known Allergies   Vital Signs: Temp: 97.6 F (36.4 C) (12/16 0520) Temp src: Oral (12/16 0520) BP: 119/62 mmHg (12/16 0520) Pulse Rate: 62  (12/16 0520)  Labs:  Basename 02/13/11 0507 02/12/11 0420 02/11/11 0835  HGB 10.1* 11.3* --  HCT 30.5* 34.6* --  PLT 179 211 --  APTT -- -- --  LABPROT 20.7* 15.3* 14.7  INR 1.74* 1.18 1.13  HEPARINUNFRC -- -- --  CREATININE 1.74* 1.49* --  CKTOTAL -- -- --  CKMB -- -- --  TROPONINI -- -- --   Estimated Creatinine Clearance: 39.7 ml/min (by C-G formula based on Cr of 1.74).  Medical History: Past Medical History  Diagnosis Date  . Hypertension   . Hyperlipidemia   . Type 2 diabetes mellitus   . History of DVT of lower extremity     recurrent  . Hemorrhage Feb 20 2010    spontaneous hemorrhage of right thigh with possible over anticoagulation  . Atrial fibrillation   . Paroxysmal atrial fibrillation     CHADS2 SCORE 3  . GERD (gastroesophageal reflux disease)   . Diabetes mellitus     niddm  . Arthritis   . PONV (postoperative nausea and vomiting)     Medications:  Scheduled:     . docusate sodium  100 mg Oral BID  . enoxaparin  30 mg Subcutaneous Q12H  . ferrous sulfate  325 mg Oral TID PC  . glipiZIDE  5 mg Oral BID AC  . insulin aspart  0-15 Units Subcutaneous TID WC  . insulin aspart  0-5 Units Subcutaneous QHS  . lisinopril  10 mg Oral Daily  . lisinopril  20 mg Oral Daily  . pantoprazole  40 mg Oral QAC breakfast  . predniSONE  5 mg Oral Q0700  . simvastatin  20 mg Oral QHS  . warfarin  7.5 mg Oral ONCE-1800  . DISCONTD: hydrochlorothiazide  12.5 mg Oral Daily  . DISCONTD: lisinopril-hydrochlorothiazide  0.5 tablet Oral QHS    Assessment:  77 yo male on chronic coumadin PTA for h/o afib, and recurrent DVTs.  Patient is now s/p R TKA with orders to resume coumadin.    Home dose = 5  mg po daily  Patient is now s/p 2 doses of Coumadin 7.5 mg, INR jumped quickly to 1.74.  Also receiving lovenox 30 sq q12h until INR > 2.  Plan discharge in AM, worsened renal function.  Goal of Therapy:  INR 2-3   Plan:   Coumadin 5 mg po x 1 tonight  Continue Lovenox 30mg  SQ q12 until INR > 2  If discharge tomorrow, would recommend resuming home dose of 5 mg daily if INR within therapeutic range.   Please call pharmacy for further recommendations   Please reassess renal function in AM prior to discharge.  Vanessa Ottawa, PharmD.  Pager:  [phone] 8:33 AM
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
## 2011-02-14 LAB — CBC
MCV: 93.1 fL (ref 78.0–100.0)
Platelets: 183 10*3/uL (ref 150–400)
RDW: 13.7 % (ref 11.5–15.5)
WBC: 10.5 10*3/uL (ref 4.0–10.5)

## 2011-02-14 LAB — PROTIME-INR: INR: 1.95 — ABNORMAL HIGH (ref 0.00–1.49)

## 2011-02-14 LAB — GLUCOSE, CAPILLARY: Glucose-Capillary: 117 mg/dL — ABNORMAL HIGH (ref 70–99)

## 2011-02-14 MED ORDER — FERROUS SULFATE 325 (65 FE) MG PO TABS: 325.0000 mg | ORAL_TABLET | Freq: Three times a day (TID) | ORAL | Status: DC

## 2011-02-14 MED ORDER — METHOCARBAMOL 500 MG PO TABS: 500.0000 mg | ORAL_TABLET | Freq: Four times a day (QID) | ORAL | Status: AC | PRN

## 2011-02-14 MED ORDER — ENOXAPARIN SODIUM 30 MG/0.3ML ~~LOC~~ SOLN: 30.0000 mg | Freq: Two times a day (BID) | SUBCUTANEOUS | Status: DC

## 2011-02-14 MED ORDER — OXYCODONE HCL 5 MG PO TABS: 5.0000 mg | ORAL_TABLET | ORAL | Status: AC | PRN

## 2011-02-14 NOTE — Discharge Summary (Signed)
NAME:  Don Hall, Don Hall NO.:  [id number]  MEDICAL RECORD NO.:  [id number]  LOCATION:  [id number]                         FACILITY:  [facility]  PHYSICIAN:  Cynda Familia, M.D.DATE OF BIRTH:  Apr 11, 1927  DATE OF ADMISSION:  02/11/2011 DATE OF DISCHARGE:  02/14/2011                              DISCHARGE SUMMARY   DISPOSITION:  To home.  ADMISSION DIAGNOSES: 1. End-stage osteoarthritis, right knee. 2. History of a left total knee arthroplasty. 3. History of multiple deep venous thromboses, on chronic Coumadin     therapy. 4. Hypertension. 5. Hypercholesterolemia. 6. Reflux disease. 7. Diabetes. 8. Macular degeneration. 9. History of chronic renal insufficiency. 10.History of chronic lumbar degenerative disk disease.  DISCHARGE DIAGNOSES: 1. Right total knee arthroplasty without complications. 2. Mild postoperative hyponatremia, will allow to correct with fluid     restrictions. 3. Acute postoperative blood loss anemia, asymptomatic, will allow to     correct with oral supplementation. 4. Chronic Coumadin therapy.  INR at discharge is 1.95, on Lovenox     bridging. 5. History of multiple deep venous thromboses, on chronic Coumadin     therapy. 6. Hypertension. 7. Hypercholesterolemia. 8. Reflux disease. 9. Diabetes. 10.Macular degeneration. 11.Chronic renal insufficiency. 12.History of chronic lumbar degenerative disk disease.  HISTORY OF PRESENT ILLNESS:  The patient is an 77 year old gentleman, well known to Dr. Theda Sers, for evaluation of multiple arthritic issues. He recently had a total knee arthroplasty on the left in 2009, did very well.  The patient elected to proceed with a total knee arthroplasty on the right due to failed conservative treatments.  The patient has been evaluated and cleared by his primary care physician, Dr. [name] and his cardiologist, Dr. Dannielle Burn.  He was bridged with Lovenox prior to his surgery.  He was bridged with  Lovenox while Coumadin therapy was reinitiated.  MEDICATIONS ON ADMISSION: 1. Glipizide 10 mg a day. 2. Lisinopril/hydrochlorothiazide 20/12.5 mg 1-1/2 tablets a day.  He     takes 1 tablet in the morning and half a tablet at night. 3. Omeprazole 40 mg twice a day. 4. Simvastatin 20 mg a day. 5. Coumadin 5 mg a day. 6. Prednisone 5 mg a day, on a tapered dosing, to be managed through     his rheumatologist. 7. PreserVision Soft Gel 2 a day. 8. Venastat 1 tablet a day for circulation.  SURGICAL PROCEDURE:  On February 11, 2011, the patient was taken to the OR by Dr. Hart Robinsons, assisted by Lorretta Harp, PA-C under general anesthesia.  The patient underwent a right total knee arthroplasty without any complications.  The patient had the following components implanted; a Sigma rotating platform, size 5 femur, size 6 tibial tray, size 41 three-peg patella, and a 10-mm polyethylene insert.  The patient had one Hemovac drain left in place.  Upon leaving the OR, he was transferred to the recovery room in good condition.  CONSULTS:  The following routine consults were requested; Physical Therapy, Pharmacy, Case Management.  HOSPITAL COURSE:  On February 11, 2011, the patient was admitted to [facility] under the care of Dr. Hart Robinsons.  The patient was taken to the OR where a right total  knee arthroplasty was performed without any complications.  The patient was transferred to the recovery room, then to the orthopedic floor in good condition to follow a total knee protocol; on IV antibiotics, pain medicines, Lovenox for bridging while restarting his Coumadin therapy.  The patient had 3 days' postoperative course in which he had no untoward events.  He did very well.  Postop day #1, his vital signs were stable.  His Hemovac was discontinued.  His dressing was intact.  His leg was neuromotor, vascularly intact.  He was out of bed with physical therapy and the use of CPM.   Postoperative day, he was able to get off his IV fluids.  He is tolerating his p.o. pain meds while his dressing was taken down.  His wound was benign for any signs of infection.  Well approximated with Steri-Strips.  Leg was neuromotor, vascularly intact.  Continue to work with physical therapy.  Postop day #3, the patient's vital signs were stable.  The patient's wound was benign for any signs of infection.  The leg was neuromotor, vascularly intact.  He participated well with physical therapy.  His hemoglobin did drop to 9.7, but he was tolerating it well without any lightheadedness, dizziness, or shortness of breath. So he was allowed to correct with p.o. supplementation.  His sodium on postop day #2 did drop to 133, but he was able to tolerate that well, and we will just allow to self-correct with fluid restrictions, no more IV fluids.  His INR was 9.5 on discharge, current Coumadin treatment. The patient was felt to be medically and orthopedically stable and ready for discharge home.  He was discharged home in good condition, for a 2- week followup appointment with Dr. Theda Sers and the [facility] in [place] to manage his Coumadin levels.  LABS:  CBC on February 14, 2011; WBCs were 10.5, hemoglobin 9.7, hematocrit 29.5, platelets 183.  His INR is 1.95.  Routine chemistries on December 16; sodium of 133, potassium of 4.3, BUN 29, creatinine 1.74.  DISCHARGE MEDICATIONS: 1. OxyIR 5 mg 1 or 2 tablets every 4 to 6 hours for pain if needed. 2. Robaxin 500 mg 1 tablet every 6 hours for muscle spasms if needed. 3. Iron 325 mg 1 tablet twice 3 times a day. 4. Lovenox 30 mg subcu injection every 12 hours for another 3 days to     bridge while being covered with his Coumadin therapy. 5. Coumadin 7.5 mg once a day until changed by the [facility]. 6. Glipizide 5 mg a day. 7. Lisinopril/hydrochlorothiazide 20/12.5 mg once in the morning, half     a tablet at  night. 8. Prednisone 5 mg once a day. 9. Zocor 20 mg a day.  CONDITION:  The patient's condition upon discharge to home is listed as improved and good.     Evert Kohl, P.A.   ______________________________ Cynda Familia, M.D.    RWK/MEDQ  D:  02/14/2011  T:  02/14/2011  Job:  [id number]  cc:   Cynda Familia, M.D. Fax: [phone]  [name], MD Fax: [phone]

## 2011-02-14 NOTE — Progress Notes (Signed)
OT NOTE:  Pt declined OT today.  Stated he knew everything he needed as this is his second TKA.  Awaiting d/c home. 02/14/2011 Nestor Lewandowsky, OTR/L Pager: [phone]

## 2011-02-14 NOTE — Anesthesia Postprocedure Evaluation (Signed)
  Anesthesia Post-op Note  Patient: Don Hall  Procedure(s) Performed:  TOTAL KNEE ARTHROPLASTY - right total knee arthroplasty  Patient Location:   Anesthesia Type:   Level of Consciousness:  Airway and Oxygen Therapy:   Post-op Pain:   Post-op Assessment:  Post-op Vital Signs:   Complications: duplicate note

## 2011-02-14 NOTE — Transfer of Care (Incomplete)
Immediate Anesthesia Transfer of Care Note  Patient: Don Hall  Procedure(s) Performed:  TOTAL KNEE ARTHROPLASTY - right total knee arthroplasty  Patient Location: {PLACES; ANE POST:19477::"PACU"}  Anesthesia Type: {PROCEDURES; ANE POST ANESTHESIA TYPE:19480}  Level of Consciousness: {FINDINGS; ANE POST LEVEL OF CONSCIOUSNESS:19484}  Airway & Oxygen Therapy: {Exam; oxygen device:30095}  Post-op Assessment: {ASSESSMENT;POST-OP MU:[id number]  Post vital signs: {DESC; ANE POST [id number]  Complications: {FINDINGS; ANE POST COMPLICATIONS:19485}

## 2011-02-14 NOTE — Transfer of Care (Signed)
Immediate Anesthesia Transfer of Care Note  Patient: Don Hall  Procedure(s) Performed:  TOTAL KNEE ARTHROPLASTY - right total knee arthroplasty  Patient Location: PACU  Anesthesia Type: Regional  Level of Consciousness: awake and alert   Airway & Oxygen Therapy: Patient Spontanous Breathing and Patient connected to face mask  Post-op Assessment: Report given to PACU RN and Post -op Vital signs reviewed and stable  Post vital signs: Reviewed and stable  Complications: No apparent anesthesia complications

## 2011-02-14 NOTE — Progress Notes (Signed)
Pt and son given discharge instruction, patient denies need for further education, patient denies having further questions.  Copy of discharge instructions and prescriptions given to patient.  Patient states he has all of his belongings.  Patient taken out via wheelchair accompanied by son.

## 2011-02-14 NOTE — Progress Notes (Signed)
Physical Therapy Treatment Patient Details Name: Don Hall MRN: [id number] DOB: [birth year] Today's Date: 02/14/2011 1030-1102 TE, G   PT Assessment/Plan  PT - Assessment/Plan Comments on Treatment Session: Pt to D/C home today and participate in [place].  Pt has not met goals of supervision level.  Pt requires Mod +2 assist for bed mobility.  Notified RN to alert PT when son arrives to discuss step management to return home safely.   PT Plan: Discharge plan remains appropriate PT Frequency: 7X/week Follow Up Recommendations: Home health PT Equipment Recommended: None recommended by OT PT Goals  Acute Rehab PT Goals PT Goal Formulation: With patient Time For Goal Achievement: 7 days Pt will go Supine/Side to Sit: with supervision PT Goal: Supine/Side to Sit - Progress: Progressing toward goal Pt will go Sit to Supine/Side: with supervision PT Goal: Sit to Supine/Side - Progress: Progressing toward goal Pt will go Sit to Stand: with supervision PT Goal: Sit to Stand - Progress: Progressing toward goal Pt will go Stand to Sit: with supervision PT Goal: Stand to Sit - Progress: Progressing toward goal Pt will Ambulate: 51 - 150 feet;with supervision;with rolling walker PT Goal: Ambulate - Progress: Progressing toward goal Pt will Go Up / Down Stairs: 1-2 stairs;with min assist;with least restrictive assistive device PT Goal: Up/Down Stairs - Progress: Progressing toward goal  PT Treatment Precautions/Restrictions  Precautions Precautions: Knee Required Braces or Orthoses: Yes Knee Immobilizer: Discontinue once straight leg raise with < 10 degree lag Restrictions Weight Bearing Restrictions: No RLE Weight Bearing: Weight bearing as tolerated Other Position/Activity Restrictions: WBAT Mobility (including Balance) Bed Mobility Bed Mobility: Yes Supine to Sit: 1: +2 Total assist;Patient percentage (comment);With rails;HOB elevated (Comment degrees) (Pt performed 60%, HOB approx  20 deg) Supine to Sit Details (indicate cue type and reason): cues for hand placement/sequencing Transfers Transfers: Yes Sit to Stand: 1: +2 Total assist;From bed;With upper extremity assist;Patient percentage (comment) (Pt performed 60%) Sit to Stand Details (indicate cue type and reason): cues for LE management and hand placement to push from bed Stand to Sit: 4: Min assist;To chair/3-in-1;With upper extremity assist;With armrests Stand to Sit Details: required cues for UE placement and RLE management Ambulation/Gait Ambulation/Gait: Yes Ambulation/Gait Assistance: 4: Min assist Ambulation/Gait Assistance Details (indicate cue type and reason): cues for posture and RW placement Ambulation Distance (Feet): 50 Feet Assistive device: Rolling walker Gait Pattern: Step-to pattern Gait velocity: decreased velocity Stairs: Yes Stairs Assistance: 1: +2 Total assist;Patient percentage (comment) (Pt performed 70%) Stairs Assistance Details (indicate cue type and reason): Cues for sequencing and technique, RW placement Stair Management Technique: No rails;With walker;Backwards;Step to pattern;Forwards (Backward up/fowards down) Number of Stairs: 1  Height of Stairs: 6  Wheelchair Mobility Wheelchair Mobility: No    Exercise  Total Joint Exercises Ankle Circles/Pumps: AROM;Right;10 reps;Supine Quad Sets: AROM;Right;10 reps;Supine Short Arc Quad: Right;AAROM;10 reps;Supine Heel Slides: AAROM;Right;10 reps;Supine Hip ABduction/ADduction: AAROM;Right;10 reps;Supine Straight Leg Raises: AAROM;Right;10 reps;Supine End of Session PT - End of Session Equipment Utilized During Treatment: Gait belt;Right knee immobilizer Activity Tolerance: Patient tolerated treatment well Patient left: in chair;with call bell in reach Nurse Communication: Mobility status for ambulation (Notified RN to contact PT when pts son arrives) General Behavior During Session: [facility] for tasks performed Cognition: [facility] for  tasks performed  [name], Betha Loa 02/14/2011, 11:27 AM

## 2011-02-14 NOTE — Progress Notes (Signed)
Subjective: No complaints Patient denies any complaints this morning states he feels good like to go home   Objective: Vital signs in last 24 hours: Temp:  [97.4 F (36.3 C)-98.3 F (36.8 C)] 98.1 F (36.7 C) (12/17 0622) Pulse Rate:  [86-95] 95  (12/17 0622) Resp:  [18-20] 20  (12/17 0622) BP: (102-120)/(47-65) 106/64 mmHg (12/17 0622) SpO2:  [93 %-96 %] 96 % (12/17 0622)  Intake/Output from previous day: 12/16 0701 - 12/17 0700 In: 840 [P.O.:840] Out: 875 [Urine:875] Intake/Output this shift:     Basename 02/14/11 0405 02/13/11 0507 02/12/11 0420  HGB 9.7* 10.1* 11.3*    Basename 02/14/11 0405 02/13/11 0507  WBC 10.5 14.0*  RBC 3.17* 3.22*  HCT 29.5* 30.5*  PLT 183 179    Basename 02/13/11 0507 02/12/11 0420  NA 133* 138  K 4.3 4.9  CL 100 102  CO2 25 27  BUN 29* 30*  CREATININE 1.74* 1.49*  GLUCOSE 80 186*  CALCIUM 8.7 8.9    Basename 02/14/11 0405 02/13/11 0507  LABPT -- --  INR 1.95* 1.74*    Compartment soft Patient is conscious alert appropriate appears to be very comfortable sitting up eating his breakfast. His right knee wound is well approximated with Steri-Strips he has no erythema no signs of infection no blisters his calf and thigh are soft and nontender his leg is neuromotor vascularly intact  Assessment/Plan: Impression postop day #3 right total knee arthroplasty doing well Mild hyponatremia asymptomatic we'll allow up to self correct Acute postoperative blood loss anemia will allow up to self correct with by mouth supplementation Plan discharge to home with home health physical therapy home health or and for PT and INR drawn and for Coumadin level to be managed through the [place] [facility] in [place]. 2 week followup with Dr. [place] Service 02/14/2011, 7:37 AM

## 2011-02-15 ENCOUNTER — Encounter (HOSPITAL_COMMUNITY): Payer: Self-pay | Admitting: Specialist

## 2011-02-15 DIAGNOSIS — I10 Essential (primary) hypertension: Secondary | ICD-10-CM | POA: Diagnosis not present

## 2011-02-15 DIAGNOSIS — Z96659 Presence of unspecified artificial knee joint: Secondary | ICD-10-CM | POA: Diagnosis not present

## 2011-02-15 DIAGNOSIS — Z471 Aftercare following joint replacement surgery: Secondary | ICD-10-CM | POA: Diagnosis not present

## 2011-02-15 DIAGNOSIS — Z86718 Personal history of other venous thrombosis and embolism: Secondary | ICD-10-CM | POA: Diagnosis not present

## 2011-02-15 DIAGNOSIS — E119 Type 2 diabetes mellitus without complications: Secondary | ICD-10-CM | POA: Diagnosis not present

## 2011-02-16 ENCOUNTER — Telehealth: Payer: Self-pay | Admitting: *Deleted

## 2011-02-16 ENCOUNTER — Ambulatory Visit (INDEPENDENT_AMBULATORY_CARE_PROVIDER_SITE_OTHER): Payer: Self-pay | Admitting: *Deleted

## 2011-02-16 DIAGNOSIS — I82409 Acute embolism and thrombosis of unspecified deep veins of unspecified lower extremity: Secondary | ICD-10-CM

## 2011-02-16 DIAGNOSIS — Z7901 Long term (current) use of anticoagulants: Secondary | ICD-10-CM

## 2011-02-16 DIAGNOSIS — R0989 Other specified symptoms and signs involving the circulatory and respiratory systems: Secondary | ICD-10-CM

## 2011-02-16 DIAGNOSIS — I4891 Unspecified atrial fibrillation: Secondary | ICD-10-CM

## 2011-02-16 DIAGNOSIS — Z471 Aftercare following joint replacement surgery: Secondary | ICD-10-CM | POA: Diagnosis not present

## 2011-02-16 DIAGNOSIS — Z96659 Presence of unspecified artificial knee joint: Secondary | ICD-10-CM | POA: Diagnosis not present

## 2011-02-16 DIAGNOSIS — I10 Essential (primary) hypertension: Secondary | ICD-10-CM | POA: Diagnosis not present

## 2011-02-16 DIAGNOSIS — E119 Type 2 diabetes mellitus without complications: Secondary | ICD-10-CM | POA: Diagnosis not present

## 2011-02-16 DIAGNOSIS — Z86718 Personal history of other venous thrombosis and embolism: Secondary | ICD-10-CM | POA: Diagnosis not present

## 2011-02-16 LAB — PROTIME-INR: INR: 2 — AB (ref 0.9–1.1)

## 2011-02-16 NOTE — Telephone Encounter (Signed)
Spoke with pt's wife.  OK for pt to stop Lovenox since INR was 2.0 today.  Continue coumadin 5mg  daily and Gentiva to recheck INR on 02/17/11.  Wife verbalized understanding and Spoke with Mickel Baas at [place] to draw INR 02/17/11

## 2011-02-16 NOTE — Telephone Encounter (Signed)
Patient had coumadin drawn last night, needs to know about lovenox injection.  Please call.

## 2011-02-17 ENCOUNTER — Ambulatory Visit (INDEPENDENT_AMBULATORY_CARE_PROVIDER_SITE_OTHER): Payer: Self-pay | Admitting: *Deleted

## 2011-02-17 DIAGNOSIS — E119 Type 2 diabetes mellitus without complications: Secondary | ICD-10-CM | POA: Diagnosis not present

## 2011-02-17 DIAGNOSIS — Z86718 Personal history of other venous thrombosis and embolism: Secondary | ICD-10-CM | POA: Diagnosis not present

## 2011-02-17 DIAGNOSIS — Z471 Aftercare following joint replacement surgery: Secondary | ICD-10-CM | POA: Diagnosis not present

## 2011-02-17 DIAGNOSIS — I82409 Acute embolism and thrombosis of unspecified deep veins of unspecified lower extremity: Secondary | ICD-10-CM

## 2011-02-17 DIAGNOSIS — I10 Essential (primary) hypertension: Secondary | ICD-10-CM | POA: Diagnosis not present

## 2011-02-17 DIAGNOSIS — Z7901 Long term (current) use of anticoagulants: Secondary | ICD-10-CM

## 2011-02-17 DIAGNOSIS — Z96659 Presence of unspecified artificial knee joint: Secondary | ICD-10-CM | POA: Diagnosis not present

## 2011-02-17 DIAGNOSIS — I4891 Unspecified atrial fibrillation: Secondary | ICD-10-CM

## 2011-02-17 DIAGNOSIS — R0989 Other specified symptoms and signs involving the circulatory and respiratory systems: Secondary | ICD-10-CM

## 2011-02-17 LAB — POCT INR: INR: 1.2

## 2011-02-17 NOTE — Patient Instructions (Signed)
Called and spoke with Don Hall.  Pt to restart Lovenox 80mg  bid and continue coumadin 5mg  daily until next INR check on 02/21/11.  She verbalized understanding.  (A different nurse is to go out on Monday with a different machine)   Case discussed with Porfirio Oar PharmD so she can adjust dose on 12/24 ans needed.

## 2011-02-18 DIAGNOSIS — E119 Type 2 diabetes mellitus without complications: Secondary | ICD-10-CM | POA: Diagnosis not present

## 2011-02-18 DIAGNOSIS — I10 Essential (primary) hypertension: Secondary | ICD-10-CM | POA: Diagnosis not present

## 2011-02-18 DIAGNOSIS — Z86718 Personal history of other venous thrombosis and embolism: Secondary | ICD-10-CM | POA: Diagnosis not present

## 2011-02-18 DIAGNOSIS — Z96659 Presence of unspecified artificial knee joint: Secondary | ICD-10-CM | POA: Diagnosis not present

## 2011-02-18 DIAGNOSIS — Z471 Aftercare following joint replacement surgery: Secondary | ICD-10-CM | POA: Diagnosis not present

## 2011-02-21 ENCOUNTER — Ambulatory Visit (INDEPENDENT_AMBULATORY_CARE_PROVIDER_SITE_OTHER): Payer: Self-pay | Admitting: Cardiovascular Disease

## 2011-02-21 DIAGNOSIS — I82409 Acute embolism and thrombosis of unspecified deep veins of unspecified lower extremity: Secondary | ICD-10-CM

## 2011-02-21 DIAGNOSIS — E119 Type 2 diabetes mellitus without complications: Secondary | ICD-10-CM | POA: Diagnosis not present

## 2011-02-21 DIAGNOSIS — Z471 Aftercare following joint replacement surgery: Secondary | ICD-10-CM | POA: Diagnosis not present

## 2011-02-21 DIAGNOSIS — I10 Essential (primary) hypertension: Secondary | ICD-10-CM | POA: Diagnosis not present

## 2011-02-21 DIAGNOSIS — I4891 Unspecified atrial fibrillation: Secondary | ICD-10-CM

## 2011-02-21 DIAGNOSIS — R0989 Other specified symptoms and signs involving the circulatory and respiratory systems: Secondary | ICD-10-CM

## 2011-02-21 DIAGNOSIS — Z86718 Personal history of other venous thrombosis and embolism: Secondary | ICD-10-CM | POA: Diagnosis not present

## 2011-02-21 DIAGNOSIS — Z7901 Long term (current) use of anticoagulants: Secondary | ICD-10-CM

## 2011-02-21 DIAGNOSIS — Z96659 Presence of unspecified artificial knee joint: Secondary | ICD-10-CM | POA: Diagnosis not present

## 2011-02-21 LAB — POCT INR: INR: 1.9

## 2011-02-23 DIAGNOSIS — I10 Essential (primary) hypertension: Secondary | ICD-10-CM | POA: Diagnosis not present

## 2011-02-23 DIAGNOSIS — Z471 Aftercare following joint replacement surgery: Secondary | ICD-10-CM | POA: Diagnosis not present

## 2011-02-23 DIAGNOSIS — Z96659 Presence of unspecified artificial knee joint: Secondary | ICD-10-CM | POA: Diagnosis not present

## 2011-02-23 DIAGNOSIS — E119 Type 2 diabetes mellitus without complications: Secondary | ICD-10-CM | POA: Diagnosis not present

## 2011-02-23 DIAGNOSIS — Z86718 Personal history of other venous thrombosis and embolism: Secondary | ICD-10-CM | POA: Diagnosis not present

## 2011-02-24 DIAGNOSIS — Z86718 Personal history of other venous thrombosis and embolism: Secondary | ICD-10-CM | POA: Diagnosis not present

## 2011-02-24 DIAGNOSIS — I10 Essential (primary) hypertension: Secondary | ICD-10-CM | POA: Diagnosis not present

## 2011-02-24 DIAGNOSIS — Z471 Aftercare following joint replacement surgery: Secondary | ICD-10-CM | POA: Diagnosis not present

## 2011-02-24 DIAGNOSIS — E119 Type 2 diabetes mellitus without complications: Secondary | ICD-10-CM | POA: Diagnosis not present

## 2011-02-24 DIAGNOSIS — Z96659 Presence of unspecified artificial knee joint: Secondary | ICD-10-CM | POA: Diagnosis not present

## 2011-02-25 DIAGNOSIS — Z471 Aftercare following joint replacement surgery: Secondary | ICD-10-CM | POA: Diagnosis not present

## 2011-02-25 DIAGNOSIS — Z86718 Personal history of other venous thrombosis and embolism: Secondary | ICD-10-CM | POA: Diagnosis not present

## 2011-02-25 DIAGNOSIS — Z96659 Presence of unspecified artificial knee joint: Secondary | ICD-10-CM | POA: Diagnosis not present

## 2011-02-25 DIAGNOSIS — E119 Type 2 diabetes mellitus without complications: Secondary | ICD-10-CM | POA: Diagnosis not present

## 2011-02-25 DIAGNOSIS — I10 Essential (primary) hypertension: Secondary | ICD-10-CM | POA: Diagnosis not present

## 2011-02-28 ENCOUNTER — Ambulatory Visit (INDEPENDENT_AMBULATORY_CARE_PROVIDER_SITE_OTHER): Payer: Self-pay | Admitting: *Deleted

## 2011-02-28 DIAGNOSIS — I4891 Unspecified atrial fibrillation: Secondary | ICD-10-CM

## 2011-02-28 DIAGNOSIS — R0989 Other specified symptoms and signs involving the circulatory and respiratory systems: Secondary | ICD-10-CM

## 2011-02-28 DIAGNOSIS — Z86718 Personal history of other venous thrombosis and embolism: Secondary | ICD-10-CM | POA: Diagnosis not present

## 2011-02-28 DIAGNOSIS — Z7901 Long term (current) use of anticoagulants: Secondary | ICD-10-CM

## 2011-02-28 DIAGNOSIS — Z471 Aftercare following joint replacement surgery: Secondary | ICD-10-CM | POA: Diagnosis not present

## 2011-02-28 DIAGNOSIS — E119 Type 2 diabetes mellitus without complications: Secondary | ICD-10-CM | POA: Diagnosis not present

## 2011-02-28 DIAGNOSIS — I82409 Acute embolism and thrombosis of unspecified deep veins of unspecified lower extremity: Secondary | ICD-10-CM

## 2011-02-28 DIAGNOSIS — I10 Essential (primary) hypertension: Secondary | ICD-10-CM | POA: Diagnosis not present

## 2011-02-28 DIAGNOSIS — Z96659 Presence of unspecified artificial knee joint: Secondary | ICD-10-CM | POA: Diagnosis not present

## 2011-03-02 DIAGNOSIS — M1710 Unilateral primary osteoarthritis, unspecified knee: Secondary | ICD-10-CM | POA: Diagnosis not present

## 2011-03-03 DIAGNOSIS — Z96659 Presence of unspecified artificial knee joint: Secondary | ICD-10-CM | POA: Diagnosis not present

## 2011-03-03 DIAGNOSIS — I10 Essential (primary) hypertension: Secondary | ICD-10-CM | POA: Diagnosis not present

## 2011-03-03 DIAGNOSIS — Z471 Aftercare following joint replacement surgery: Secondary | ICD-10-CM | POA: Diagnosis not present

## 2011-03-03 DIAGNOSIS — E119 Type 2 diabetes mellitus without complications: Secondary | ICD-10-CM | POA: Diagnosis not present

## 2011-03-03 DIAGNOSIS — Z86718 Personal history of other venous thrombosis and embolism: Secondary | ICD-10-CM | POA: Diagnosis not present

## 2011-03-04 DIAGNOSIS — Z86718 Personal history of other venous thrombosis and embolism: Secondary | ICD-10-CM | POA: Diagnosis not present

## 2011-03-04 DIAGNOSIS — Z471 Aftercare following joint replacement surgery: Secondary | ICD-10-CM | POA: Diagnosis not present

## 2011-03-04 DIAGNOSIS — I10 Essential (primary) hypertension: Secondary | ICD-10-CM | POA: Diagnosis not present

## 2011-03-04 DIAGNOSIS — E119 Type 2 diabetes mellitus without complications: Secondary | ICD-10-CM | POA: Diagnosis not present

## 2011-03-04 DIAGNOSIS — Z96659 Presence of unspecified artificial knee joint: Secondary | ICD-10-CM | POA: Diagnosis not present

## 2011-03-07 DIAGNOSIS — R269 Unspecified abnormalities of gait and mobility: Secondary | ICD-10-CM | POA: Diagnosis not present

## 2011-03-07 DIAGNOSIS — M25569 Pain in unspecified knee: Secondary | ICD-10-CM | POA: Diagnosis not present

## 2011-03-08 ENCOUNTER — Ambulatory Visit (INDEPENDENT_AMBULATORY_CARE_PROVIDER_SITE_OTHER): Payer: Medicare Other | Admitting: *Deleted

## 2011-03-08 DIAGNOSIS — I82409 Acute embolism and thrombosis of unspecified deep veins of unspecified lower extremity: Secondary | ICD-10-CM | POA: Diagnosis not present

## 2011-03-08 DIAGNOSIS — I4891 Unspecified atrial fibrillation: Secondary | ICD-10-CM

## 2011-03-08 DIAGNOSIS — Z7901 Long term (current) use of anticoagulants: Secondary | ICD-10-CM

## 2011-03-08 LAB — POCT INR: INR: 2.4

## 2011-03-10 DIAGNOSIS — R269 Unspecified abnormalities of gait and mobility: Secondary | ICD-10-CM | POA: Diagnosis not present

## 2011-03-10 DIAGNOSIS — M25569 Pain in unspecified knee: Secondary | ICD-10-CM | POA: Diagnosis not present

## 2011-03-14 ENCOUNTER — Encounter: Payer: Self-pay | Admitting: Internal Medicine

## 2011-03-14 DIAGNOSIS — R269 Unspecified abnormalities of gait and mobility: Secondary | ICD-10-CM | POA: Diagnosis not present

## 2011-03-14 DIAGNOSIS — M25569 Pain in unspecified knee: Secondary | ICD-10-CM | POA: Diagnosis not present

## 2011-03-14 DIAGNOSIS — D045 Carcinoma in situ of skin of trunk: Secondary | ICD-10-CM | POA: Diagnosis not present

## 2011-03-16 DIAGNOSIS — R269 Unspecified abnormalities of gait and mobility: Secondary | ICD-10-CM | POA: Diagnosis not present

## 2011-03-16 DIAGNOSIS — M25569 Pain in unspecified knee: Secondary | ICD-10-CM | POA: Diagnosis not present

## 2011-03-18 DIAGNOSIS — R269 Unspecified abnormalities of gait and mobility: Secondary | ICD-10-CM | POA: Diagnosis not present

## 2011-03-18 DIAGNOSIS — M25569 Pain in unspecified knee: Secondary | ICD-10-CM | POA: Diagnosis not present

## 2011-03-21 DIAGNOSIS — M25569 Pain in unspecified knee: Secondary | ICD-10-CM | POA: Diagnosis not present

## 2011-03-21 DIAGNOSIS — R269 Unspecified abnormalities of gait and mobility: Secondary | ICD-10-CM | POA: Diagnosis not present

## 2011-03-23 DIAGNOSIS — M25569 Pain in unspecified knee: Secondary | ICD-10-CM | POA: Diagnosis not present

## 2011-03-23 DIAGNOSIS — R269 Unspecified abnormalities of gait and mobility: Secondary | ICD-10-CM | POA: Diagnosis not present

## 2011-03-25 DIAGNOSIS — M999 Biomechanical lesion, unspecified: Secondary | ICD-10-CM | POA: Diagnosis not present

## 2011-03-25 DIAGNOSIS — M47817 Spondylosis without myelopathy or radiculopathy, lumbosacral region: Secondary | ICD-10-CM | POA: Diagnosis not present

## 2011-03-28 DIAGNOSIS — M25569 Pain in unspecified knee: Secondary | ICD-10-CM | POA: Diagnosis not present

## 2011-03-28 DIAGNOSIS — M47817 Spondylosis without myelopathy or radiculopathy, lumbosacral region: Secondary | ICD-10-CM | POA: Diagnosis not present

## 2011-03-28 DIAGNOSIS — M999 Biomechanical lesion, unspecified: Secondary | ICD-10-CM | POA: Diagnosis not present

## 2011-03-28 DIAGNOSIS — R269 Unspecified abnormalities of gait and mobility: Secondary | ICD-10-CM | POA: Diagnosis not present

## 2011-03-29 ENCOUNTER — Ambulatory Visit (INDEPENDENT_AMBULATORY_CARE_PROVIDER_SITE_OTHER): Payer: Medicare Other | Admitting: *Deleted

## 2011-03-29 DIAGNOSIS — I4891 Unspecified atrial fibrillation: Secondary | ICD-10-CM | POA: Diagnosis not present

## 2011-03-29 DIAGNOSIS — Z7901 Long term (current) use of anticoagulants: Secondary | ICD-10-CM | POA: Diagnosis not present

## 2011-03-29 DIAGNOSIS — I82409 Acute embolism and thrombosis of unspecified deep veins of unspecified lower extremity: Secondary | ICD-10-CM

## 2011-03-29 LAB — POCT INR: INR: 2.9

## 2011-03-30 DIAGNOSIS — M25569 Pain in unspecified knee: Secondary | ICD-10-CM | POA: Diagnosis not present

## 2011-03-30 DIAGNOSIS — R269 Unspecified abnormalities of gait and mobility: Secondary | ICD-10-CM | POA: Diagnosis not present

## 2011-04-01 DIAGNOSIS — M25569 Pain in unspecified knee: Secondary | ICD-10-CM | POA: Diagnosis not present

## 2011-04-01 DIAGNOSIS — R269 Unspecified abnormalities of gait and mobility: Secondary | ICD-10-CM | POA: Diagnosis not present

## 2011-04-04 DIAGNOSIS — R269 Unspecified abnormalities of gait and mobility: Secondary | ICD-10-CM | POA: Diagnosis not present

## 2011-04-04 DIAGNOSIS — M25569 Pain in unspecified knee: Secondary | ICD-10-CM | POA: Diagnosis not present

## 2011-04-05 DIAGNOSIS — M999 Biomechanical lesion, unspecified: Secondary | ICD-10-CM | POA: Diagnosis not present

## 2011-04-05 DIAGNOSIS — S335XXA Sprain of ligaments of lumbar spine, initial encounter: Secondary | ICD-10-CM | POA: Diagnosis not present

## 2011-04-06 DIAGNOSIS — R269 Unspecified abnormalities of gait and mobility: Secondary | ICD-10-CM | POA: Diagnosis not present

## 2011-04-06 DIAGNOSIS — M25569 Pain in unspecified knee: Secondary | ICD-10-CM | POA: Diagnosis not present

## 2011-04-08 DIAGNOSIS — M25569 Pain in unspecified knee: Secondary | ICD-10-CM | POA: Diagnosis not present

## 2011-04-08 DIAGNOSIS — R269 Unspecified abnormalities of gait and mobility: Secondary | ICD-10-CM | POA: Diagnosis not present

## 2011-04-11 DIAGNOSIS — M25569 Pain in unspecified knee: Secondary | ICD-10-CM | POA: Diagnosis not present

## 2011-04-11 DIAGNOSIS — R269 Unspecified abnormalities of gait and mobility: Secondary | ICD-10-CM | POA: Diagnosis not present

## 2011-04-13 DIAGNOSIS — M5137 Other intervertebral disc degeneration, lumbosacral region: Secondary | ICD-10-CM | POA: Diagnosis not present

## 2011-04-14 DIAGNOSIS — I10 Essential (primary) hypertension: Secondary | ICD-10-CM | POA: Diagnosis not present

## 2011-04-14 DIAGNOSIS — E7800 Pure hypercholesterolemia, unspecified: Secondary | ICD-10-CM | POA: Diagnosis not present

## 2011-04-14 DIAGNOSIS — E119 Type 2 diabetes mellitus without complications: Secondary | ICD-10-CM | POA: Diagnosis not present

## 2011-04-15 DIAGNOSIS — R269 Unspecified abnormalities of gait and mobility: Secondary | ICD-10-CM | POA: Diagnosis not present

## 2011-04-15 DIAGNOSIS — M25569 Pain in unspecified knee: Secondary | ICD-10-CM | POA: Diagnosis not present

## 2011-04-15 DIAGNOSIS — M5137 Other intervertebral disc degeneration, lumbosacral region: Secondary | ICD-10-CM | POA: Diagnosis not present

## 2011-04-19 DIAGNOSIS — M5137 Other intervertebral disc degeneration, lumbosacral region: Secondary | ICD-10-CM | POA: Diagnosis not present

## 2011-04-19 DIAGNOSIS — M25569 Pain in unspecified knee: Secondary | ICD-10-CM | POA: Diagnosis not present

## 2011-04-19 DIAGNOSIS — R269 Unspecified abnormalities of gait and mobility: Secondary | ICD-10-CM | POA: Diagnosis not present

## 2011-04-21 ENCOUNTER — Encounter: Payer: Self-pay | Admitting: Internal Medicine

## 2011-04-21 DIAGNOSIS — R269 Unspecified abnormalities of gait and mobility: Secondary | ICD-10-CM | POA: Diagnosis not present

## 2011-04-21 DIAGNOSIS — M5137 Other intervertebral disc degeneration, lumbosacral region: Secondary | ICD-10-CM | POA: Diagnosis not present

## 2011-04-21 DIAGNOSIS — M25569 Pain in unspecified knee: Secondary | ICD-10-CM | POA: Diagnosis not present

## 2011-04-22 DIAGNOSIS — E782 Mixed hyperlipidemia: Secondary | ICD-10-CM | POA: Diagnosis not present

## 2011-04-22 DIAGNOSIS — G622 Polyneuropathy due to other toxic agents: Secondary | ICD-10-CM | POA: Diagnosis not present

## 2011-04-22 DIAGNOSIS — G619 Inflammatory polyneuropathy, unspecified: Secondary | ICD-10-CM | POA: Diagnosis not present

## 2011-04-22 DIAGNOSIS — I82409 Acute embolism and thrombosis of unspecified deep veins of unspecified lower extremity: Secondary | ICD-10-CM | POA: Diagnosis not present

## 2011-04-22 DIAGNOSIS — E119 Type 2 diabetes mellitus without complications: Secondary | ICD-10-CM | POA: Diagnosis not present

## 2011-04-22 DIAGNOSIS — N189 Chronic kidney disease, unspecified: Secondary | ICD-10-CM | POA: Diagnosis not present

## 2011-04-22 DIAGNOSIS — I10 Essential (primary) hypertension: Secondary | ICD-10-CM | POA: Diagnosis not present

## 2011-04-22 DIAGNOSIS — M1990 Unspecified osteoarthritis, unspecified site: Secondary | ICD-10-CM | POA: Diagnosis not present

## 2011-04-25 ENCOUNTER — Encounter: Payer: Self-pay | Admitting: Gastroenterology

## 2011-04-25 ENCOUNTER — Ambulatory Visit (INDEPENDENT_AMBULATORY_CARE_PROVIDER_SITE_OTHER): Payer: Medicare Other | Admitting: Gastroenterology

## 2011-04-25 DIAGNOSIS — Z800 Family history of malignant neoplasm of digestive organs: Secondary | ICD-10-CM

## 2011-04-25 DIAGNOSIS — K219 Gastro-esophageal reflux disease without esophagitis: Secondary | ICD-10-CM

## 2011-04-25 NOTE — Assessment & Plan Note (Signed)
Refractory heartburn and epigastric burning several weeks duration. No improvement on BID PPI. Patient with recent knee replacement. HgbA1C of 6. Doubt underlying gastroparesis with mild DM, well-controlled. Recommend EGD in near future. Patient has persistent anemia with Hgb 11 post-op but improved.
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
## 2011-04-25 NOTE — Patient Instructions (Signed)
We are going to schedule you for an upper endoscopy and colonoscopy. I will need to discuss with your cardiologist first to determine if Lovenox bridge will be needed. We will let you know as soon as possible and get your prescriptions to you.

## 2011-04-25 NOTE — Assessment & Plan Note (Signed)
Due for f/u colonoscopy given FH of colon cancer. Hgb 11, TKR in 01/2011. No overt GI bleeding. Given his h/o recurrent DVT, he would likely need Lovenox bridge. To discuss with Dr. Dannielle Burn and Dr. Gala Romney. Patient prefers BID dosing if needed. He will need RX in time to have filled by [place] due to expense.

## 2011-04-25 NOTE — Progress Notes (Signed)
Faxed to PCP

## 2011-04-26 ENCOUNTER — Ambulatory Visit (INDEPENDENT_AMBULATORY_CARE_PROVIDER_SITE_OTHER): Payer: Medicare Other | Admitting: *Deleted

## 2011-04-26 DIAGNOSIS — M5137 Other intervertebral disc degeneration, lumbosacral region: Secondary | ICD-10-CM | POA: Diagnosis not present

## 2011-04-26 DIAGNOSIS — I82409 Acute embolism and thrombosis of unspecified deep veins of unspecified lower extremity: Secondary | ICD-10-CM

## 2011-04-26 DIAGNOSIS — Z7901 Long term (current) use of anticoagulants: Secondary | ICD-10-CM

## 2011-04-26 DIAGNOSIS — M25569 Pain in unspecified knee: Secondary | ICD-10-CM | POA: Diagnosis not present

## 2011-04-26 DIAGNOSIS — I4891 Unspecified atrial fibrillation: Secondary | ICD-10-CM | POA: Diagnosis not present

## 2011-04-26 DIAGNOSIS — R269 Unspecified abnormalities of gait and mobility: Secondary | ICD-10-CM | POA: Diagnosis not present

## 2011-04-26 LAB — POCT INR: INR: 2.1

## 2011-04-26 NOTE — Progress Notes (Signed)
Faxed request to Dr. Dannielle Burn to advise about holding coumadin x 4 days prior to colonoscopy and EGD.

## 2011-04-27 ENCOUNTER — Telehealth: Payer: Self-pay

## 2011-04-28 DIAGNOSIS — R269 Unspecified abnormalities of gait and mobility: Secondary | ICD-10-CM | POA: Diagnosis not present

## 2011-04-28 DIAGNOSIS — M25569 Pain in unspecified knee: Secondary | ICD-10-CM | POA: Diagnosis not present

## 2011-04-28 DIAGNOSIS — M5137 Other intervertebral disc degeneration, lumbosacral region: Secondary | ICD-10-CM | POA: Diagnosis not present

## 2011-04-28 NOTE — Telephone Encounter (Signed)
Opened in error

## 2011-04-29 ENCOUNTER — Telehealth: Payer: Self-pay

## 2011-04-29 NOTE — Telephone Encounter (Signed)
Phone call from [place] at Dr. Arlina Robes. Blenda Peals will give instructions for [employer]. She just needs to have a date so she can give the instructions. ( Pt needs to have time to get his Lovenox from [facility] Alma Friendly said to call Lattie Haw at [phone] when the date is set.

## 2011-05-02 ENCOUNTER — Other Ambulatory Visit: Payer: Self-pay | Admitting: Gastroenterology

## 2011-05-02 DIAGNOSIS — Z800 Family history of malignant neoplasm of digestive organs: Secondary | ICD-10-CM

## 2011-05-02 DIAGNOSIS — K219 Gastro-esophageal reflux disease without esophagitis: Secondary | ICD-10-CM

## 2011-05-02 MED ORDER — PEG 3350-KCL-NA BICARB-NACL 420 G PO SOLR: ORAL | Status: AC

## 2011-05-02 NOTE — Telephone Encounter (Signed)
Pt is scheduled for 03/14- I called and spoke with Christella Noa- She will take care of the Lovenox Bridge instructions - Prep and Rx mailed to pt

## 2011-05-03 ENCOUNTER — Other Ambulatory Visit: Payer: Self-pay | Admitting: Cardiology

## 2011-05-03 MED ORDER — ENOXAPARIN SODIUM 80 MG/0.8ML ~~LOC~~ SOLN: 80.0000 mg | Freq: Two times a day (BID) | SUBCUTANEOUS | Status: DC

## 2011-05-09 ENCOUNTER — Encounter (HOSPITAL_COMMUNITY): Payer: Self-pay | Admitting: Pharmacy Technician

## 2011-05-11 MED ORDER — SODIUM CHLORIDE 0.45 % IV SOLN
Freq: Once | INTRAVENOUS | Status: AC
  Administered 2011-05-12: 09:00:00 via INTRAVENOUS

## 2011-05-12 ENCOUNTER — Encounter (HOSPITAL_COMMUNITY)
Admission: RE | Disposition: A | Discharge status: Home / Self Care | Payer: Self-pay | Source: Ambulatory Visit | Attending: Internal Medicine

## 2011-05-12 ENCOUNTER — Encounter (HOSPITAL_COMMUNITY): Payer: Self-pay

## 2011-05-12 ENCOUNTER — Ambulatory Visit (HOSPITAL_COMMUNITY)
Admission: RE | Admit: 2011-05-12 | Discharge: 2011-05-12 | Disposition: A | Discharge status: Home / Self Care | Payer: Medicare Other | Source: Ambulatory Visit | Attending: Internal Medicine | Admitting: Internal Medicine

## 2011-05-12 DIAGNOSIS — Z800 Family history of malignant neoplasm of digestive organs: Secondary | ICD-10-CM

## 2011-05-12 DIAGNOSIS — E119 Type 2 diabetes mellitus without complications: Secondary | ICD-10-CM | POA: Insufficient documentation

## 2011-05-12 DIAGNOSIS — Z79899 Other long term (current) drug therapy: Secondary | ICD-10-CM | POA: Diagnosis not present

## 2011-05-12 DIAGNOSIS — K449 Diaphragmatic hernia without obstruction or gangrene: Secondary | ICD-10-CM | POA: Insufficient documentation

## 2011-05-12 DIAGNOSIS — R1013 Epigastric pain: Secondary | ICD-10-CM | POA: Insufficient documentation

## 2011-05-12 DIAGNOSIS — K219 Gastro-esophageal reflux disease without esophagitis: Secondary | ICD-10-CM

## 2011-05-12 DIAGNOSIS — D126 Benign neoplasm of colon, unspecified: Secondary | ICD-10-CM | POA: Diagnosis not present

## 2011-05-12 DIAGNOSIS — I10 Essential (primary) hypertension: Secondary | ICD-10-CM | POA: Diagnosis not present

## 2011-05-12 DIAGNOSIS — K2940 Chronic atrophic gastritis without bleeding: Secondary | ICD-10-CM | POA: Diagnosis not present

## 2011-05-12 DIAGNOSIS — K620 Anal polyp: Secondary | ICD-10-CM | POA: Diagnosis not present

## 2011-05-12 DIAGNOSIS — K3189 Other diseases of stomach and duodenum: Secondary | ICD-10-CM | POA: Diagnosis not present

## 2011-05-12 DIAGNOSIS — D128 Benign neoplasm of rectum: Secondary | ICD-10-CM | POA: Diagnosis not present

## 2011-05-12 DIAGNOSIS — K2990 Gastroduodenitis, unspecified, without bleeding: Secondary | ICD-10-CM | POA: Diagnosis not present

## 2011-05-12 DIAGNOSIS — E785 Hyperlipidemia, unspecified: Secondary | ICD-10-CM | POA: Insufficient documentation

## 2011-05-12 DIAGNOSIS — Z1211 Encounter for screening for malignant neoplasm of colon: Secondary | ICD-10-CM | POA: Diagnosis not present

## 2011-05-12 DIAGNOSIS — K5730 Diverticulosis of large intestine without perforation or abscess without bleeding: Secondary | ICD-10-CM | POA: Diagnosis not present

## 2011-05-12 DIAGNOSIS — K2970 Gastritis, unspecified, without bleeding: Secondary | ICD-10-CM | POA: Diagnosis not present

## 2011-05-12 DIAGNOSIS — K621 Rectal polyp: Secondary | ICD-10-CM

## 2011-05-12 DIAGNOSIS — D129 Benign neoplasm of anus and anal canal: Secondary | ICD-10-CM | POA: Insufficient documentation

## 2011-05-12 HISTORY — PX: ESOPHAGOGASTRODUODENOSCOPY: SHX1529

## 2011-05-12 HISTORY — PX: COLONOSCOPY: SHX174

## 2011-05-12 SURGERY — COLONOSCOPY WITH ESOPHAGOGASTRODUODENOSCOPY (EGD)
Anesthesia: Moderate Sedation

## 2011-05-12 MED ORDER — MIDAZOLAM HCL 5 MG/5ML IJ SOLN
INTRAMUSCULAR | Status: AC
  Filled 2011-05-12: qty 10

## 2011-05-12 MED ORDER — MEPERIDINE HCL 100 MG/ML IJ SOLN
INTRAMUSCULAR | Status: AC
  Filled 2011-05-12: qty 1

## 2011-05-12 MED ORDER — MEPERIDINE HCL 100 MG/ML IJ SOLN
INTRAMUSCULAR | Status: DC | PRN
  Administered 2011-05-12: 25 mg via INTRAVENOUS
  Administered 2011-05-12: 50 mg via INTRAVENOUS

## 2011-05-12 MED ORDER — MIDAZOLAM HCL 5 MG/5ML IJ SOLN
INTRAMUSCULAR | Status: DC | PRN
  Administered 2011-05-12: 2 mg via INTRAVENOUS
  Administered 2011-05-12 (×3): 1 mg via INTRAVENOUS

## 2011-05-12 MED ORDER — STERILE WATER FOR IRRIGATION IR SOLN
Status: DC | PRN
  Administered 2011-05-12: 10:00:00

## 2011-05-12 NOTE — Op Note (Signed)
[facility] [address] [place],   [address]  COLONOSCOPY PROCEDURE REPORT  PATIENT:  Don, Hall  MR#:  [id number] BIRTHDATE:  19-May-1927, 84 yrs. old  GENDER:  male ENDOSCOPIST:  R. Garfield Cornea, MD FACP [facility] REF. BY:  Ezra Sites, M.D. [name], M.D. PROCEDURE DATE:  05/12/2011 PROCEDURE:  Colonoscopy with biopsy  INDICATIONS:  High-risk screening colonoscopy  INFORMED CONSENT:  The risks, benefits, alternatives and imponderables including but not limited to bleeding, perforation as well as the possibility of a missed lesion have been reviewed. The potential for biopsy, lesion removal, etc. have also been discussed.  Questions have been answered.  All parties agreeable. Please see the history and physical in the medical record for more information.  MEDICATIONS:  IV Versed and Demerol in incremental doses.  DESCRIPTION OF PROCEDURE:  After a digital rectal exam was performed, the EG-[id number]) and [id number]) colonoscope was advanced from the anus through the rectum and colon to the area of the cecum, ileocecal valve and appendiceal orifice.  The cecum was deeply intubated.  These structures were well-seen and photographed for the record.  From the level of the cecum and ileocecal valve, the scope was slowly and cautiously withdrawn.  The mucosal surfaces were carefully surveyed utilizing scope tip deflection to facilitate fold flattening as needed.  The scope was pulled down into the rectum where a thorough examination including retroflexion was performed. <<PROCEDUREIMAGES>>  FINDINGS: Marginal prep. Single diminutive polyp in the rectum - 5 cm from anal verge. Left-sided diverticulosis. 4 mm area of polypoid mucosa          in the base of  the cecum; otherwise colonic mucosa appeared grossly normal  THERAPEUTIC / DIAGNOSTIC MANEUVERS PERFORMED:  Both polyps noted above cold biopsied/removed.  COMPLICATIONS:  None  CECAL  WITHDRAWAL TIME: 10 minutes  IMPRESSION:  Colon and rectal polyps-removed as described above. Colonic diverticulosis.  RECOMMENDATIONS:  Followup pathology. See EGD report. For now, stop Prilosec; try AcipHex 20 mg orally daily x14 days. Coupon for free                   medication provided. He may need further evaluation of his dyspepsia symptoms. Patient to resume Coumadin and         Lovenox today. He is to continue Lovenox until INR therapeutic per Dr. Dannielle Burn.  ______________________________ R. Garfield Cornea, MD Quentin Ore  CC:  Ezra Sites, M.D. [name], M.D.  n. eSIGNED:   R. Garfield Cornea at 05/12/2011 10:48 AM  Don Hall, [id number]

## 2011-05-12 NOTE — Interval H&P Note (Signed)
History and Physical Interval Note:  05/12/2011 9:41 AM  Don Hall  has presented today for surgery, with the diagnosis of GERD and family hx of CRC  The various methods of treatment have been discussed with the patient and family. After consideration of risks, benefits and other options for treatment, the patient has consented to  Procedure(s) (LRB): COLONOSCOPY WITH ESOPHAGOGASTRODUODENOSCOPY (EGD) (N/A) as a surgical intervention .  The patients' history has been reviewed, patient examined, no change in status, stable for surgery.  I have reviewed the patients' chart and labs.  Questions were answered to the patient's satisfaction.     Manus Rudd

## 2011-05-12 NOTE — Op Note (Signed)
[facility] [address] [place], [place]  [address]  ENDOSCOPY PROCEDURE REPORT  PATIENT:  Don Hall, Don Hall  MR#:  [id number] BIRTHDATE:  1927/12/10, 84 yrs. old  GENDER:  male  ENDOSCOPIST:  R. Garfield Cornea, MD Quentin Ore Referred by:  [name], M.D. Ezra Sites, M.D.  PROCEDURE DATE:  05/12/2011 PROCEDURE:  EGD with gastric biopsy  INDICATIONS:   refractory "reflux". Dyspepsia.  INFORMED CONSENT:   The risks, benefits, limitations, alternatives and imponderables have been discussed.  The potential for biopsy, esophogeal dilation, etc. have also been reviewed.  Questions have been answered.  All parties agreeable.  Please see the history and physical in the medical record for more information.  MEDICATIONS:     Versed 3 mg IV and Demerol 50 mg IV in divided doses. Cetacaine spray.  DESCRIPTION OF PROCEDURE:   The EG-[id number]) endoscope was introduced through the mouth and advanced to the second portion of the duodenum without difficulty or limitations.  The mucosal surfaces were surveyed very carefully during advancement of the scope and upon withdrawal.  Retroflexion view of the proximal stomach and esophagogastric junction was performed.  <<PROCEDUREIMAGES>>  FINDINGS:   Normal esophagus. Stomach empty.  Small hiatal hernia. Subtle "mosaic" appearing gastric mucosa. No ulcer or infiltrating process. Patent pylorus. Normal first and second portion of the duodenum  THERAPEUTIC / DIAGNOSTIC MANEUVERS PERFORMED:   Biopsies of the gastric antrum and body were taken for histologic study.  COMPLICATIONS:   None  IMPRESSION:              Subtle abnormality of the gastric mucosa of uncertain significance-status post biopsy. Small hiatal hernia; the remainder of                exam normal  RECOMMENDATIONS:    Further recommendations to follow; see colonoscopy report.  ______________________________ R. Garfield Cornea, MD Quentin Ore  CC:  n. eSIGNED:   R. Garfield Cornea at 05/12/2011 10:05 AM  Shirline Frees, [id number]

## 2011-05-12 NOTE — Discharge Instructions (Signed)
Colonoscopy Discharge Instructions  Read the instructions outlined below and refer to this sheet in the next few weeks. These discharge instructions provide you with general information on caring for yourself after you leave the hospital. Your doctor may also give you specific instructions. While your treatment has been planned according to the most current medical practices available, unavoidable complications occasionally occur. If you have any problems or questions after discharge, call Dr. Gala Romney at [phone]. ACTIVITY  You may resume your regular activity, but move at a slower pace for the next 24 hours.   Take frequent rest periods for the next 24 hours.   Walking will help get rid of the air and reduce the bloated feeling in your belly (abdomen).   No driving for 24 hours (because of the medicine (anesthesia) used during the test).    Do not sign any important legal documents or operate any machinery for 24 hours (because of the anesthesia used during the test).  NUTRITION  Drink plenty of fluids.   You may resume your normal diet as instructed by your doctor.   Begin with a light meal and progress to your normal diet. Heavy or fried foods are harder to digest and may make you feel sick to your stomach (nauseated).   Avoid alcoholic beverages for 24 hours or as instructed.  MEDICATIONS  You may resume your normal medications unless your doctor tells you otherwise.  WHAT YOU CAN EXPECT TODAY  Some feelings of bloating in the abdomen.   Passage of more gas than usual.   Spotting of blood in your stool or on the toilet paper.  IF YOU HAD POLYPS REMOVED DURING THE COLONOSCOPY:  No aspirin products for 7 days or as instructed.   No alcohol for 7 days or as instructed.   Eat a soft diet for the next 24 hours.  FINDING OUT THE RESULTS OF YOUR TEST Not all test results are available during your visit. If your test results are not back during the visit, make an appointment  with your caregiver to find out the results. Do not assume everything is normal if you have not heard from your caregiver or the medical facility. It is important for you to follow up on all of your test results.  SEEK IMMEDIATE MEDICAL ATTENTION IF:  You have more than a spotting of blood in your stool.   Your belly is swollen (abdominal distention).   You are nauseated or vomiting.   You have a temperature over 101.   You have abdominal pain or discomfort that is severe or gets worse throughout the day.   EGD Discharge instructions Please read the instructions outlined below and refer to this sheet in the next few weeks. These discharge instructions provide you with general information on caring for yourself after you leave the hospital. Your doctor may also give you specific instructions. While your treatment has been planned according to the most current medical practices available, unavoidable complications occasionally occur. If you have any problems or questions after discharge, please call your doctor. ACTIVITY  You may resume your regular activity but move at a slower pace for the next 24 hours.   Take frequent rest periods for the next 24 hours.   Walking will help expel (get rid of) the air and reduce the bloated feeling in your abdomen.   No driving for 24 hours (because of the anesthesia (medicine) used during the test).   You may shower.   Do not sign any  important legal documents or operate any machinery for 24 hours (because of the anesthesia used during the test).  NUTRITION  Drink plenty of fluids.   You may resume your normal diet.   Begin with a light meal and progress to your normal diet.   Avoid alcoholic beverages for 24 hours or as instructed by your caregiver.  MEDICATIONS  You may resume your normal medications unless your caregiver tells you otherwise.  WHAT YOU CAN EXPECT TODAY  You may experience abdominal discomfort such as a feeling of  fullness or "gas" pains.  FOLLOW-UP  Your doctor will discuss the results of your test with you.  SEEK IMMEDIATE MEDICAL ATTENTION IF ANY OF THE FOLLOWING OCCUR:  Excessive nausea (feeling sick to your stomach) and/or vomiting.   Severe abdominal pain and distention (swelling).   Trouble swallowing.   Temperature over 101 F (37.8 C).   Rectal bleeding or vomiting of blood.     Polyp and diverticulosis information provided.  Further recommendations to follow pending review of pathology report.  Stop Prilosec for now; try AcipHex 20 mg orally daily x14 days. Prescription and coupon for free medication provided.  Resume Coumadin today. Resume Lovenox. Continue Lovenox until INR therapeutic per Dr. Gae Dry Polyps A polyp is extra tissue that grows inside your body. Colon polyps grow in the large intestine. The large intestine, also called the colon, is part of your digestive system. It is a long, hollow tube at the end of your digestive tract where your body makes and stores stool. Most polyps are not dangerous. They are benign. This means they are not cancerous. But over time, some types of polyps can turn into cancer. Polyps that are smaller than a pea are usually not harmful. But larger polyps could someday become or may already be cancerous. To be safe, doctors remove all polyps and test them.  WHO GETS POLYPS? Anyone can get polyps, but certain people are more likely than others. You may have a greater chance of getting polyps if:  You are over 50.   You have had polyps before.   Someone in your family has had polyps.   Someone in your family has had cancer of the large intestine.   Find out if someone in your family has had polyps. You may also be more likely to get polyps if you:   Eat a lot of fatty foods.   Smoke.   Drink alcohol.   Do not exercise.   Eat too much.  SYMPTOMS  Most small polyps do not cause symptoms. People often do not know they have one  until their caregiver finds it during a regular checkup or while testing them for something else. Some people do have symptoms like these:  Bleeding from the anus. You might notice blood on your underwear or on toilet paper after you have had a bowel movement.   Constipation or diarrhea that lasts more than a week.   Blood in the stool. Blood can make stool look black or it can show up as red streaks in the stool.  If you have any of these symptoms, see your caregiver. HOW DOES THE DOCTOR TEST FOR POLYPS? The doctor can use four tests to check for polyps:  Digital rectal exam. The caregiver wears gloves and checks your rectum (the last part of the large intestine) to see if it feels normal. This test would find polyps only in the rectum. Your caregiver may need to do one of the other  tests listed below to find polyps higher up in the intestine.   Barium enema. The caregiver puts a liquid called barium into your rectum before taking x-rays of your large intestine. Barium makes your intestine look white in the pictures. Polyps are dark, so they are easy to see.   Sigmoidoscopy. With this test, the caregiver can see inside your large intestine. A thin flexible tube is placed into your rectum. The device is called a sigmoidoscope, which has a light and a tiny video camera in it. The caregiver uses the sigmoidoscope to look at the last third of your large intestine.   Colonoscopy. This test is like sigmoidoscopy, but the caregiver looks at all of the large intestine. It usually requires sedation. This is the most common method for finding and removing polyps.  TREATMENT   The caregiver will remove the polyp during sigmoidoscopy or colonoscopy. The polyp is then tested for cancer.   If you have had polyps, your caregiver may want you to get tested regularly in the future.  PREVENTION  There is not one sure way to prevent polyps. You might be able to lower your risk of getting them if you:  Eat  more fruits and vegetables and less fatty food.   Do not smoke.   Avoid alcohol.   Exercise every day.   Lose weight if you are overweight.   Eating more calcium and folate can also lower your risk of getting polyps. Some foods that are rich in calcium are milk, cheese, and broccoli. Some foods that are rich in folate are chickpeas, kidney beans, and spinach.   Aspirin might help prevent polyps. Studies are under way.  Document Released: 11/11/2003 Document Revised: 02/03/2011 Document Reviewed: 04/18/2007 [facility] Patient Information 2012 [place].Diverticulosis Diverticulosis is a common condition that develops when small pouches (diverticula) form in the wall of the colon. The risk of diverticulosis increases with age. It happens more often in people who eat a low-fiber diet. Most individuals with diverticulosis have no symptoms. Those individuals with symptoms usually experience abdominal pain, constipation, or loose stools (diarrhea). HOME CARE INSTRUCTIONS   Increase the amount of fiber in your diet as directed by your caregiver or dietician. This may reduce symptoms of diverticulosis.   Your caregiver may recommend taking a dietary fiber supplement.   Drink at least 6 to 8 glasses of water each day to prevent constipation.   Try not to strain when you have a bowel movement.   Your caregiver may recommend avoiding nuts and seeds to prevent complications, although this is still an uncertain benefit.   Only take over-the-counter or prescription medicines for pain, discomfort, or fever as directed by your caregiver.  FOODS WITH HIGH FIBER CONTENT INCLUDE:  Fruits. Apple, peach, pear, tangerine, raisins, prunes.   Vegetables. Brussels sprouts, asparagus, broccoli, cabbage, carrot, cauliflower, romaine lettuce, spinach, summer squash, tomato, winter squash, zucchini.   Starchy Vegetables. Baked beans, kidney beans, lima beans, split peas, lentils, potatoes (with skin).    Grains. Whole wheat bread, brown rice, bran flake cereal, plain oatmeal, white rice, shredded wheat, bran muffins.  SEEK IMMEDIATE MEDICAL CARE IF:   You develop increasing pain or severe bloating.   You have an oral temperature above 102 F (38.9 C), not controlled by medicine.   You develop vomiting or bowel movements that are bloody or black.  Document Released: 11/12/2003 Document Revised: 02/03/2011 Document Reviewed: 07/15/2009 [facility] Patient Information 2012 [place].
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
## 2011-05-15 ENCOUNTER — Encounter: Payer: Self-pay | Admitting: Internal Medicine

## 2011-05-17 ENCOUNTER — Telehealth: Payer: Self-pay | Admitting: General Practice

## 2011-05-17 ENCOUNTER — Ambulatory Visit (INDEPENDENT_AMBULATORY_CARE_PROVIDER_SITE_OTHER): Payer: Medicare Other | Admitting: *Deleted

## 2011-05-17 DIAGNOSIS — E119 Type 2 diabetes mellitus without complications: Secondary | ICD-10-CM | POA: Diagnosis not present

## 2011-05-17 DIAGNOSIS — I4891 Unspecified atrial fibrillation: Secondary | ICD-10-CM

## 2011-05-17 DIAGNOSIS — I82409 Acute embolism and thrombosis of unspecified deep veins of unspecified lower extremity: Secondary | ICD-10-CM | POA: Diagnosis not present

## 2011-05-17 DIAGNOSIS — Z7901 Long term (current) use of anticoagulants: Secondary | ICD-10-CM | POA: Diagnosis not present

## 2011-05-17 DIAGNOSIS — E1149 Type 2 diabetes mellitus with other diabetic neurological complication: Secondary | ICD-10-CM | POA: Diagnosis not present

## 2011-05-17 NOTE — Telephone Encounter (Signed)
Per Dr. Gala Romney pt needs CT Abd/ Pelvis with contrast for upper abd pain.  Pt is scheduled for his ct scan 05/20/11@11 :00am.  Pt will pick up contrast today at [place] spoke with [facility] in Radiology pt will have a STAT Creat. The day of his CT Scan prior to his scan.  Order palced in [place].

## 2011-05-20 ENCOUNTER — Ambulatory Visit (HOSPITAL_COMMUNITY)
Admission: RE | Admit: 2011-05-20 | Discharge: 2011-05-20 | Disposition: A | Discharge status: Home / Self Care | Payer: Medicare Other | Source: Ambulatory Visit | Attending: Internal Medicine | Admitting: Internal Medicine

## 2011-05-20 DIAGNOSIS — Q619 Cystic kidney disease, unspecified: Secondary | ICD-10-CM | POA: Diagnosis not present

## 2011-05-20 DIAGNOSIS — N281 Cyst of kidney, acquired: Secondary | ICD-10-CM | POA: Diagnosis not present

## 2011-05-20 DIAGNOSIS — K439 Ventral hernia without obstruction or gangrene: Secondary | ICD-10-CM | POA: Diagnosis not present

## 2011-05-20 DIAGNOSIS — R109 Unspecified abdominal pain: Secondary | ICD-10-CM | POA: Diagnosis not present

## 2011-05-20 LAB — POCT I-STAT, CHEM 8
BUN: 27 mg/dL — ABNORMAL HIGH (ref 6–23)
Calcium, Ion: 1.23 mmol/L (ref 1.12–1.32)
Chloride: 106 mEq/L (ref 96–112)
Creatinine, Ser: 1.3 mg/dL (ref 0.50–1.35)
Glucose, Bld: 89 mg/dL (ref 70–99)

## 2011-05-20 MED ORDER — IOHEXOL 300 MG/ML  SOLN
100.0000 mL | Freq: Once | INTRAMUSCULAR | Status: AC | PRN
  Administered 2011-05-20: 100 mL via INTRAVENOUS

## 2011-05-23 ENCOUNTER — Encounter: Payer: Self-pay | Admitting: Internal Medicine

## 2011-05-23 NOTE — Progress Notes (Signed)
Patient ID: Don Hall, male   DOB: Jun 13, 1927, 78 y.o.   MRN: [id number] CT reviewed. Stable pulmonary nodule-radiologist  recommends no  further evaluation . Nonobstructing right renal calculus. Stable left renal cyst. Degenerative spine changes.  New finding of a left-sided spigelian hernia-which may well be contributing significantly to the patient's upper abdominal discomfort. I recommend surgical consultation. If he does not have a preference, we did send him to Dr. Aviva Signs locally.  Otherwise, send him to the surgeon of his choice.

## 2011-05-24 ENCOUNTER — Ambulatory Visit (INDEPENDENT_AMBULATORY_CARE_PROVIDER_SITE_OTHER): Payer: Medicare Other | Admitting: *Deleted

## 2011-05-24 DIAGNOSIS — Z7901 Long term (current) use of anticoagulants: Secondary | ICD-10-CM | POA: Diagnosis not present

## 2011-05-24 DIAGNOSIS — I82409 Acute embolism and thrombosis of unspecified deep veins of unspecified lower extremity: Secondary | ICD-10-CM

## 2011-05-24 DIAGNOSIS — I4891 Unspecified atrial fibrillation: Secondary | ICD-10-CM | POA: Diagnosis not present

## 2011-05-24 LAB — POCT INR: INR: 2.3

## 2011-05-26 NOTE — Progress Notes (Signed)
Informed pt of RMR recommendations, he said he has an appt with RMR soon and he wants to discuss it with him prior to seeing a surgeon.

## 2011-06-03 ENCOUNTER — Encounter: Payer: Self-pay | Admitting: Internal Medicine

## 2011-06-08 DIAGNOSIS — M1710 Unilateral primary osteoarthritis, unspecified knee: Secondary | ICD-10-CM | POA: Diagnosis not present

## 2011-06-14 ENCOUNTER — Ambulatory Visit (INDEPENDENT_AMBULATORY_CARE_PROVIDER_SITE_OTHER): Payer: Medicare Other | Admitting: Internal Medicine

## 2011-06-14 ENCOUNTER — Encounter: Payer: Self-pay | Admitting: Internal Medicine

## 2011-06-14 DIAGNOSIS — R109 Unspecified abdominal pain: Secondary | ICD-10-CM | POA: Diagnosis not present

## 2011-06-14 DIAGNOSIS — K439 Ventral hernia without obstruction or gangrene: Secondary | ICD-10-CM
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
## 2011-06-14 NOTE — Patient Instructions (Signed)
You have a new  Left-sided spigelian hernia  You can see if your abdominal binder helps  We will make you an appointment to see Dr. Judeth Cornfield at [facility] in 3-4 weeks To get his opinion regarding your hernia and symptoms.

## 2011-06-14 NOTE — Assessment & Plan Note (Signed)
Pleasant 78 year old gentleman with relatively new onset postprandial abdominal pain. I doubt his symptoms are reflux related. His background reflux symptoms have been well controlled with acid suppression therapy. His pain does not sound like reflux. Not  typical for ischemia, the need new spigelian hernia on the left side may have more to do with this symptomatology than anything else. Certainl,y no other cause for abdominal pain seen on cross-sectional imaging or EGD/colonoscopy recently.  Recommendations: I told this gentleman we will go ahead and make an appointment to see his surgeon electively -  to get his opinion regarding the causal relationship between the spigelian hernia and his abdominal pain. He has seen Dr. Demetrio Lapping over at [facility] previously.  In the interim, the patient could try his abdominal binder which he has for his back during the day so long as not to apply it to tightly to see if this gives him some temporary relief.  I have also advised him to continue taking his Prilosec for good control of his typical reflux symptoms.

## 2011-06-15 ENCOUNTER — Telehealth: Payer: Self-pay | Admitting: Gastroenterology

## 2011-06-15 ENCOUNTER — Encounter: Payer: Self-pay | Admitting: Gastroenterology

## 2011-06-15 NOTE — Telephone Encounter (Signed)
Pt is scheduled to see Dr Garner Nash on 05/10 @ 2:45- I called radiology, spoke with [facility], she will have a CD made of the CT for pt to pick up- I called pt - LMOVM with these instructions, will also mail letter

## 2011-06-21 ENCOUNTER — Ambulatory Visit (INDEPENDENT_AMBULATORY_CARE_PROVIDER_SITE_OTHER): Payer: Medicare Other | Admitting: *Deleted

## 2011-06-21 DIAGNOSIS — I4891 Unspecified atrial fibrillation: Secondary | ICD-10-CM | POA: Diagnosis not present

## 2011-06-21 DIAGNOSIS — Z7901 Long term (current) use of anticoagulants: Secondary | ICD-10-CM

## 2011-06-21 DIAGNOSIS — I82409 Acute embolism and thrombosis of unspecified deep veins of unspecified lower extremity: Secondary | ICD-10-CM

## 2011-06-21 MED ORDER — WARFARIN SODIUM 5 MG PO TABS: 5.0000 mg | ORAL_TABLET | Freq: Every evening | ORAL | Status: DC

## 2011-07-05 ENCOUNTER — Ambulatory Visit (INDEPENDENT_AMBULATORY_CARE_PROVIDER_SITE_OTHER): Payer: Medicare Other | Admitting: *Deleted

## 2011-07-05 DIAGNOSIS — Z7901 Long term (current) use of anticoagulants: Secondary | ICD-10-CM

## 2011-07-05 DIAGNOSIS — I4891 Unspecified atrial fibrillation: Secondary | ICD-10-CM

## 2011-07-05 DIAGNOSIS — I82409 Acute embolism and thrombosis of unspecified deep veins of unspecified lower extremity: Secondary | ICD-10-CM | POA: Diagnosis not present

## 2011-07-11 DIAGNOSIS — D485 Neoplasm of uncertain behavior of skin: Secondary | ICD-10-CM | POA: Diagnosis not present

## 2011-07-11 DIAGNOSIS — Z85828 Personal history of other malignant neoplasm of skin: Secondary | ICD-10-CM | POA: Diagnosis not present

## 2011-07-11 DIAGNOSIS — L570 Actinic keratosis: Secondary | ICD-10-CM | POA: Diagnosis not present

## 2011-07-11 DIAGNOSIS — D0439 Carcinoma in situ of skin of other parts of face: Secondary | ICD-10-CM | POA: Diagnosis not present

## 2011-07-14 DIAGNOSIS — C44320 Squamous cell carcinoma of skin of unspecified parts of face: Secondary | ICD-10-CM | POA: Diagnosis not present

## 2011-07-26 DIAGNOSIS — E1149 Type 2 diabetes mellitus with other diabetic neurological complication: Secondary | ICD-10-CM | POA: Diagnosis not present

## 2011-07-26 DIAGNOSIS — E119 Type 2 diabetes mellitus without complications: Secondary | ICD-10-CM | POA: Diagnosis not present

## 2011-08-02 ENCOUNTER — Ambulatory Visit (INDEPENDENT_AMBULATORY_CARE_PROVIDER_SITE_OTHER): Payer: Medicare Other | Admitting: *Deleted

## 2011-08-02 DIAGNOSIS — I82409 Acute embolism and thrombosis of unspecified deep veins of unspecified lower extremity: Secondary | ICD-10-CM | POA: Diagnosis not present

## 2011-08-02 DIAGNOSIS — Z7901 Long term (current) use of anticoagulants: Secondary | ICD-10-CM | POA: Diagnosis not present

## 2011-08-02 DIAGNOSIS — I4891 Unspecified atrial fibrillation: Secondary | ICD-10-CM | POA: Diagnosis not present

## 2011-08-02 LAB — POCT INR: INR: 1.4

## 2011-08-04 DIAGNOSIS — M129 Arthropathy, unspecified: Secondary | ICD-10-CM | POA: Diagnosis not present

## 2011-08-04 DIAGNOSIS — Z7982 Long term (current) use of aspirin: Secondary | ICD-10-CM | POA: Diagnosis not present

## 2011-08-04 DIAGNOSIS — K439 Ventral hernia without obstruction or gangrene: Secondary | ICD-10-CM | POA: Diagnosis not present

## 2011-08-04 DIAGNOSIS — K219 Gastro-esophageal reflux disease without esophagitis: Secondary | ICD-10-CM | POA: Diagnosis not present

## 2011-08-04 DIAGNOSIS — Z86718 Personal history of other venous thrombosis and embolism: Secondary | ICD-10-CM | POA: Diagnosis not present

## 2011-08-04 DIAGNOSIS — E119 Type 2 diabetes mellitus without complications: Secondary | ICD-10-CM | POA: Diagnosis not present

## 2011-08-04 DIAGNOSIS — Z9889 Other specified postprocedural states: Secondary | ICD-10-CM | POA: Diagnosis not present

## 2011-08-04 DIAGNOSIS — Z79899 Other long term (current) drug therapy: Secondary | ICD-10-CM | POA: Diagnosis not present

## 2011-08-04 DIAGNOSIS — Z7901 Long term (current) use of anticoagulants: Secondary | ICD-10-CM | POA: Diagnosis not present

## 2011-08-09 ENCOUNTER — Ambulatory Visit (INDEPENDENT_AMBULATORY_CARE_PROVIDER_SITE_OTHER): Payer: Medicare Other | Admitting: *Deleted

## 2011-08-09 DIAGNOSIS — Z7901 Long term (current) use of anticoagulants: Secondary | ICD-10-CM

## 2011-08-09 DIAGNOSIS — I4891 Unspecified atrial fibrillation: Secondary | ICD-10-CM

## 2011-08-09 DIAGNOSIS — I82409 Acute embolism and thrombosis of unspecified deep veins of unspecified lower extremity: Secondary | ICD-10-CM | POA: Diagnosis not present

## 2011-08-09 LAB — POCT INR: INR: 2

## 2011-08-10 DIAGNOSIS — E782 Mixed hyperlipidemia: Secondary | ICD-10-CM | POA: Diagnosis not present

## 2011-08-10 DIAGNOSIS — I10 Essential (primary) hypertension: Secondary | ICD-10-CM | POA: Diagnosis not present

## 2011-08-10 DIAGNOSIS — E119 Type 2 diabetes mellitus without complications: Secondary | ICD-10-CM | POA: Diagnosis not present

## 2011-08-17 DIAGNOSIS — E782 Mixed hyperlipidemia: Secondary | ICD-10-CM | POA: Diagnosis not present

## 2011-08-17 DIAGNOSIS — E119 Type 2 diabetes mellitus without complications: Secondary | ICD-10-CM | POA: Diagnosis not present

## 2011-08-17 DIAGNOSIS — M1990 Unspecified osteoarthritis, unspecified site: Secondary | ICD-10-CM | POA: Diagnosis not present

## 2011-08-17 DIAGNOSIS — I10 Essential (primary) hypertension: Secondary | ICD-10-CM | POA: Diagnosis not present

## 2011-08-17 DIAGNOSIS — I82409 Acute embolism and thrombosis of unspecified deep veins of unspecified lower extremity: Secondary | ICD-10-CM | POA: Diagnosis not present

## 2011-08-17 DIAGNOSIS — N189 Chronic kidney disease, unspecified: Secondary | ICD-10-CM | POA: Diagnosis not present

## 2011-08-17 DIAGNOSIS — G622 Polyneuropathy due to other toxic agents: Secondary | ICD-10-CM | POA: Diagnosis not present

## 2011-08-26 ENCOUNTER — Ambulatory Visit (INDEPENDENT_AMBULATORY_CARE_PROVIDER_SITE_OTHER): Payer: Medicare Other | Admitting: *Deleted

## 2011-08-26 DIAGNOSIS — I82409 Acute embolism and thrombosis of unspecified deep veins of unspecified lower extremity: Secondary | ICD-10-CM | POA: Diagnosis not present

## 2011-08-26 DIAGNOSIS — I4891 Unspecified atrial fibrillation: Secondary | ICD-10-CM

## 2011-08-26 DIAGNOSIS — Z7901 Long term (current) use of anticoagulants: Secondary | ICD-10-CM | POA: Diagnosis not present

## 2011-09-12 DIAGNOSIS — L570 Actinic keratosis: Secondary | ICD-10-CM | POA: Diagnosis not present

## 2011-09-12 DIAGNOSIS — C44221 Squamous cell carcinoma of skin of unspecified ear and external auricular canal: Secondary | ICD-10-CM | POA: Diagnosis not present

## 2011-09-12 DIAGNOSIS — Z85828 Personal history of other malignant neoplasm of skin: Secondary | ICD-10-CM | POA: Diagnosis not present

## 2011-09-12 DIAGNOSIS — L738 Other specified follicular disorders: Secondary | ICD-10-CM | POA: Diagnosis not present

## 2011-09-12 DIAGNOSIS — D485 Neoplasm of uncertain behavior of skin: Secondary | ICD-10-CM | POA: Diagnosis not present

## 2011-09-12 DIAGNOSIS — D0439 Carcinoma in situ of skin of other parts of face: Secondary | ICD-10-CM | POA: Diagnosis not present

## 2011-09-26 DIAGNOSIS — M1710 Unilateral primary osteoarthritis, unspecified knee: Secondary | ICD-10-CM | POA: Diagnosis not present

## 2011-09-29 DIAGNOSIS — C44320 Squamous cell carcinoma of skin of unspecified parts of face: Secondary | ICD-10-CM | POA: Diagnosis not present

## 2011-09-29 DIAGNOSIS — C4442 Squamous cell carcinoma of skin of scalp and neck: Secondary | ICD-10-CM | POA: Diagnosis not present

## 2011-10-03 DIAGNOSIS — I82409 Acute embolism and thrombosis of unspecified deep veins of unspecified lower extremity: Secondary | ICD-10-CM | POA: Diagnosis not present

## 2011-10-04 DIAGNOSIS — L851 Acquired keratosis [keratoderma] palmaris et plantaris: Secondary | ICD-10-CM | POA: Diagnosis not present

## 2011-10-04 DIAGNOSIS — E1149 Type 2 diabetes mellitus with other diabetic neurological complication: Secondary | ICD-10-CM | POA: Diagnosis not present

## 2011-10-04 DIAGNOSIS — E119 Type 2 diabetes mellitus without complications: Secondary | ICD-10-CM | POA: Diagnosis not present

## 2011-10-04 DIAGNOSIS — L609 Nail disorder, unspecified: Secondary | ICD-10-CM | POA: Diagnosis not present

## 2011-10-13 DIAGNOSIS — D481 Neoplasm of uncertain behavior of connective and other soft tissue: Secondary | ICD-10-CM | POA: Diagnosis not present

## 2011-10-13 DIAGNOSIS — C44221 Squamous cell carcinoma of skin of unspecified ear and external auricular canal: Secondary | ICD-10-CM | POA: Diagnosis not present

## 2011-10-13 DIAGNOSIS — L570 Actinic keratosis: Secondary | ICD-10-CM | POA: Diagnosis not present

## 2011-10-13 DIAGNOSIS — D485 Neoplasm of uncertain behavior of skin: Secondary | ICD-10-CM | POA: Diagnosis not present

## 2011-10-26 DIAGNOSIS — I129 Hypertensive chronic kidney disease with stage 1 through stage 4 chronic kidney disease, or unspecified chronic kidney disease: Secondary | ICD-10-CM | POA: Diagnosis not present

## 2011-10-26 DIAGNOSIS — N183 Chronic kidney disease, stage 3 unspecified: Secondary | ICD-10-CM | POA: Diagnosis not present

## 2011-11-03 DIAGNOSIS — I82409 Acute embolism and thrombosis of unspecified deep veins of unspecified lower extremity: Secondary | ICD-10-CM | POA: Diagnosis not present

## 2011-11-04 DIAGNOSIS — M719 Bursopathy, unspecified: Secondary | ICD-10-CM | POA: Diagnosis not present

## 2011-11-04 DIAGNOSIS — M67919 Unspecified disorder of synovium and tendon, unspecified shoulder: Secondary | ICD-10-CM | POA: Diagnosis not present

## 2011-11-04 DIAGNOSIS — M545 Low back pain: Secondary | ICD-10-CM | POA: Diagnosis not present

## 2011-11-04 DIAGNOSIS — M159 Polyosteoarthritis, unspecified: Secondary | ICD-10-CM | POA: Diagnosis not present

## 2011-11-04 DIAGNOSIS — M353 Polymyalgia rheumatica: Secondary | ICD-10-CM | POA: Diagnosis not present

## 2011-11-23 DIAGNOSIS — Z23 Encounter for immunization: Secondary | ICD-10-CM | POA: Diagnosis not present

## 2011-11-24 DIAGNOSIS — I82409 Acute embolism and thrombosis of unspecified deep veins of unspecified lower extremity: Secondary | ICD-10-CM | POA: Diagnosis not present

## 2011-12-13 DIAGNOSIS — L609 Nail disorder, unspecified: Secondary | ICD-10-CM | POA: Diagnosis not present

## 2011-12-13 DIAGNOSIS — E119 Type 2 diabetes mellitus without complications: Secondary | ICD-10-CM | POA: Diagnosis not present

## 2011-12-13 DIAGNOSIS — E1149 Type 2 diabetes mellitus with other diabetic neurological complication: Secondary | ICD-10-CM | POA: Diagnosis not present

## 2011-12-13 DIAGNOSIS — L851 Acquired keratosis [keratoderma] palmaris et plantaris: Secondary | ICD-10-CM | POA: Diagnosis not present

## 2011-12-16 DIAGNOSIS — I10 Essential (primary) hypertension: Secondary | ICD-10-CM | POA: Diagnosis not present

## 2011-12-16 DIAGNOSIS — E119 Type 2 diabetes mellitus without complications: Secondary | ICD-10-CM | POA: Diagnosis not present

## 2011-12-16 DIAGNOSIS — N189 Chronic kidney disease, unspecified: Secondary | ICD-10-CM | POA: Diagnosis not present

## 2011-12-16 DIAGNOSIS — E7800 Pure hypercholesterolemia, unspecified: Secondary | ICD-10-CM | POA: Diagnosis not present

## 2011-12-20 DIAGNOSIS — L570 Actinic keratosis: Secondary | ICD-10-CM | POA: Diagnosis not present

## 2011-12-20 DIAGNOSIS — Z85828 Personal history of other malignant neoplasm of skin: Secondary | ICD-10-CM | POA: Diagnosis not present

## 2011-12-21 ENCOUNTER — Encounter: Payer: Self-pay | Admitting: *Deleted

## 2011-12-23 DIAGNOSIS — I82409 Acute embolism and thrombosis of unspecified deep veins of unspecified lower extremity: Secondary | ICD-10-CM | POA: Diagnosis not present

## 2011-12-23 DIAGNOSIS — G619 Inflammatory polyneuropathy, unspecified: Secondary | ICD-10-CM | POA: Diagnosis not present

## 2011-12-23 DIAGNOSIS — G622 Polyneuropathy due to other toxic agents: Secondary | ICD-10-CM | POA: Diagnosis not present

## 2011-12-23 DIAGNOSIS — E782 Mixed hyperlipidemia: Secondary | ICD-10-CM | POA: Diagnosis not present

## 2011-12-23 DIAGNOSIS — E119 Type 2 diabetes mellitus without complications: Secondary | ICD-10-CM | POA: Diagnosis not present

## 2011-12-23 DIAGNOSIS — I10 Essential (primary) hypertension: Secondary | ICD-10-CM | POA: Diagnosis not present

## 2011-12-23 DIAGNOSIS — M1990 Unspecified osteoarthritis, unspecified site: Secondary | ICD-10-CM | POA: Diagnosis not present

## 2011-12-27 DIAGNOSIS — I82409 Acute embolism and thrombosis of unspecified deep veins of unspecified lower extremity: Secondary | ICD-10-CM | POA: Diagnosis not present

## 2012-01-20 DIAGNOSIS — M5137 Other intervertebral disc degeneration, lumbosacral region: Secondary | ICD-10-CM | POA: Diagnosis not present

## 2012-01-30 DIAGNOSIS — I82409 Acute embolism and thrombosis of unspecified deep veins of unspecified lower extremity: Secondary | ICD-10-CM | POA: Diagnosis not present

## 2012-03-02 DIAGNOSIS — I82409 Acute embolism and thrombosis of unspecified deep veins of unspecified lower extremity: Secondary | ICD-10-CM | POA: Diagnosis not present

## 2012-03-05 ENCOUNTER — Ambulatory Visit (INDEPENDENT_AMBULATORY_CARE_PROVIDER_SITE_OTHER): Payer: Medicare Other | Admitting: Cardiology

## 2012-03-05 ENCOUNTER — Encounter: Payer: Self-pay | Admitting: Cardiology

## 2012-03-05 VITALS — BP 139/70 | HR 81 | Ht 70.0 in | Wt 219.0 lb

## 2012-03-05 DIAGNOSIS — I6529 Occlusion and stenosis of unspecified carotid artery: Secondary | ICD-10-CM | POA: Diagnosis not present

## 2012-03-05 DIAGNOSIS — I4891 Unspecified atrial fibrillation: Secondary | ICD-10-CM
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
## 2012-03-05 NOTE — Patient Instructions (Addendum)
Your physician recommends that you schedule a follow-up appointment in: 2 years. You will receive a reminder letter in the mail in about 20  months reminding you to call and schedule your appointment. If you don't receive this letter, please contact our office.  Your physician recommends that you continue on your current medications as directed. Please refer to the Current Medication list given to you today.  Your physician has requested that you have a carotid duplex. This test is an ultrasound of the carotid arteries in your neck. It looks at blood flow through these arteries that supply the brain with blood. Allow one hour for this exam. There are no restrictions or special instructions.

## 2012-03-13 DIAGNOSIS — Z96659 Presence of unspecified artificial knee joint: Secondary | ICD-10-CM | POA: Diagnosis not present

## 2012-03-15 DIAGNOSIS — Z96659 Presence of unspecified artificial knee joint: Secondary | ICD-10-CM | POA: Diagnosis not present

## 2012-03-16 DIAGNOSIS — I82409 Acute embolism and thrombosis of unspecified deep veins of unspecified lower extremity: Secondary | ICD-10-CM | POA: Diagnosis not present

## 2012-03-28 DIAGNOSIS — L570 Actinic keratosis: Secondary | ICD-10-CM | POA: Diagnosis not present

## 2012-03-28 DIAGNOSIS — D485 Neoplasm of uncertain behavior of skin: Secondary | ICD-10-CM | POA: Diagnosis not present

## 2012-03-28 DIAGNOSIS — D0460 Carcinoma in situ of skin of unspecified upper limb, including shoulder: Secondary | ICD-10-CM | POA: Diagnosis not present

## 2012-03-28 DIAGNOSIS — Z85828 Personal history of other malignant neoplasm of skin: Secondary | ICD-10-CM | POA: Diagnosis not present

## 2012-04-04 ENCOUNTER — Encounter (INDEPENDENT_AMBULATORY_CARE_PROVIDER_SITE_OTHER): Payer: Medicare Other

## 2012-04-04 DIAGNOSIS — I6529 Occlusion and stenosis of unspecified carotid artery: Secondary | ICD-10-CM | POA: Diagnosis not present

## 2012-04-04 DIAGNOSIS — Z96659 Presence of unspecified artificial knee joint: Secondary | ICD-10-CM | POA: Diagnosis not present

## 2012-04-05 DIAGNOSIS — C44621 Squamous cell carcinoma of skin of unspecified upper limb, including shoulder: Secondary | ICD-10-CM | POA: Diagnosis not present

## 2012-04-05 DIAGNOSIS — L570 Actinic keratosis: Secondary | ICD-10-CM | POA: Diagnosis not present

## 2012-04-10 ENCOUNTER — Telehealth: Payer: Self-pay | Admitting: *Deleted

## 2012-04-10 NOTE — Telephone Encounter (Signed)
Message copied by Merlene Laughter on Tue Apr 10, 2012 11:28 AM ------      Message from: FLEMING, PAMELA J      Created: Mon Apr 09, 2012  4:30 PM                   ----- Message -----         From: Minus Breeding, MD         Sent: 04/09/2012  12:44 PM           To: Ellwood Dense, RN            Follow up as suggested. ------

## 2012-04-10 NOTE — Telephone Encounter (Signed)
Patient informed. 

## 2012-04-10 NOTE — Telephone Encounter (Signed)
Left message for patient to call office.  

## 2012-04-20 DIAGNOSIS — I82409 Acute embolism and thrombosis of unspecified deep veins of unspecified lower extremity: Secondary | ICD-10-CM | POA: Diagnosis not present

## 2012-04-30 DIAGNOSIS — E782 Mixed hyperlipidemia: Secondary | ICD-10-CM | POA: Diagnosis not present

## 2012-04-30 DIAGNOSIS — I10 Essential (primary) hypertension: Secondary | ICD-10-CM | POA: Diagnosis not present

## 2012-04-30 DIAGNOSIS — E119 Type 2 diabetes mellitus without complications: Secondary | ICD-10-CM | POA: Diagnosis not present

## 2012-05-07 DIAGNOSIS — I10 Essential (primary) hypertension: Secondary | ICD-10-CM | POA: Diagnosis not present

## 2012-05-07 DIAGNOSIS — M1990 Unspecified osteoarthritis, unspecified site: Secondary | ICD-10-CM | POA: Diagnosis not present

## 2012-05-07 DIAGNOSIS — E119 Type 2 diabetes mellitus without complications: Secondary | ICD-10-CM | POA: Diagnosis not present

## 2012-05-07 DIAGNOSIS — I82409 Acute embolism and thrombosis of unspecified deep veins of unspecified lower extremity: Secondary | ICD-10-CM | POA: Diagnosis not present

## 2012-05-07 DIAGNOSIS — G622 Polyneuropathy due to other toxic agents: Secondary | ICD-10-CM | POA: Diagnosis not present

## 2012-05-07 DIAGNOSIS — E782 Mixed hyperlipidemia: Secondary | ICD-10-CM | POA: Diagnosis not present

## 2012-05-07 DIAGNOSIS — M353 Polymyalgia rheumatica: Secondary | ICD-10-CM | POA: Diagnosis not present

## 2012-05-08 DIAGNOSIS — L609 Nail disorder, unspecified: Secondary | ICD-10-CM | POA: Diagnosis not present

## 2012-05-08 DIAGNOSIS — L851 Acquired keratosis [keratoderma] palmaris et plantaris: Secondary | ICD-10-CM | POA: Diagnosis not present

## 2012-05-08 DIAGNOSIS — E1149 Type 2 diabetes mellitus with other diabetic neurological complication: Secondary | ICD-10-CM | POA: Diagnosis not present

## 2012-05-08 DIAGNOSIS — E119 Type 2 diabetes mellitus without complications: Secondary | ICD-10-CM | POA: Diagnosis not present

## 2012-05-09 DIAGNOSIS — IMO0001 Reserved for inherently not codable concepts without codable children: Secondary | ICD-10-CM | POA: Diagnosis not present

## 2012-05-09 DIAGNOSIS — L97909 Non-pressure chronic ulcer of unspecified part of unspecified lower leg with unspecified severity: Secondary | ICD-10-CM | POA: Diagnosis not present

## 2012-05-09 DIAGNOSIS — Z7901 Long term (current) use of anticoagulants: Secondary | ICD-10-CM | POA: Diagnosis not present

## 2012-05-09 DIAGNOSIS — Z79899 Other long term (current) drug therapy: Secondary | ICD-10-CM | POA: Diagnosis not present

## 2012-05-09 DIAGNOSIS — I83009 Varicose veins of unspecified lower extremity with ulcer of unspecified site: Secondary | ICD-10-CM | POA: Diagnosis not present

## 2012-05-16 DIAGNOSIS — Z79899 Other long term (current) drug therapy: Secondary | ICD-10-CM | POA: Diagnosis not present

## 2012-05-16 DIAGNOSIS — L97909 Non-pressure chronic ulcer of unspecified part of unspecified lower leg with unspecified severity: Secondary | ICD-10-CM | POA: Diagnosis not present

## 2012-05-16 DIAGNOSIS — Z7901 Long term (current) use of anticoagulants: Secondary | ICD-10-CM | POA: Diagnosis not present

## 2012-05-31 DIAGNOSIS — I82409 Acute embolism and thrombosis of unspecified deep veins of unspecified lower extremity: Secondary | ICD-10-CM | POA: Diagnosis not present

## 2012-07-10 ENCOUNTER — Ambulatory Visit: Payer: Self-pay | Admitting: *Deleted

## 2012-07-10 DIAGNOSIS — Z7901 Long term (current) use of anticoagulants: Secondary | ICD-10-CM

## 2012-07-10 DIAGNOSIS — I82409 Acute embolism and thrombosis of unspecified deep veins of unspecified lower extremity: Secondary | ICD-10-CM

## 2012-07-10 DIAGNOSIS — I4891 Unspecified atrial fibrillation: Secondary | ICD-10-CM

## 2012-07-12 DIAGNOSIS — I82409 Acute embolism and thrombosis of unspecified deep veins of unspecified lower extremity: Secondary | ICD-10-CM | POA: Diagnosis not present

## 2012-07-31 DIAGNOSIS — J209 Acute bronchitis, unspecified: Secondary | ICD-10-CM | POA: Diagnosis not present

## 2012-07-31 DIAGNOSIS — R05 Cough: Secondary | ICD-10-CM | POA: Diagnosis not present

## 2012-07-31 DIAGNOSIS — R0989 Other specified symptoms and signs involving the circulatory and respiratory systems: Secondary | ICD-10-CM | POA: Diagnosis not present

## 2012-08-01 DIAGNOSIS — D0439 Carcinoma in situ of skin of other parts of face: Secondary | ICD-10-CM | POA: Diagnosis not present

## 2012-08-01 DIAGNOSIS — D485 Neoplasm of uncertain behavior of skin: Secondary | ICD-10-CM | POA: Diagnosis not present

## 2012-08-01 DIAGNOSIS — C44621 Squamous cell carcinoma of skin of unspecified upper limb, including shoulder: Secondary | ICD-10-CM | POA: Diagnosis not present

## 2012-08-01 DIAGNOSIS — Z85828 Personal history of other malignant neoplasm of skin: Secondary | ICD-10-CM | POA: Diagnosis not present

## 2012-08-01 DIAGNOSIS — D0420 Carcinoma in situ of skin of unspecified ear and external auricular canal: Secondary | ICD-10-CM | POA: Diagnosis not present

## 2012-08-01 DIAGNOSIS — L570 Actinic keratosis: Secondary | ICD-10-CM | POA: Diagnosis not present

## 2012-08-16 DIAGNOSIS — C44320 Squamous cell carcinoma of skin of unspecified parts of face: Secondary | ICD-10-CM | POA: Diagnosis not present

## 2012-08-16 DIAGNOSIS — I82409 Acute embolism and thrombosis of unspecified deep veins of unspecified lower extremity: Secondary | ICD-10-CM | POA: Diagnosis not present

## 2012-08-16 DIAGNOSIS — C4442 Squamous cell carcinoma of skin of scalp and neck: Secondary | ICD-10-CM | POA: Diagnosis not present

## 2012-08-16 DIAGNOSIS — C44221 Squamous cell carcinoma of skin of unspecified ear and external auricular canal: Secondary | ICD-10-CM | POA: Diagnosis not present

## 2012-08-16 DIAGNOSIS — L570 Actinic keratosis: Secondary | ICD-10-CM | POA: Diagnosis not present

## 2012-08-17 DIAGNOSIS — I82409 Acute embolism and thrombosis of unspecified deep veins of unspecified lower extremity: Secondary | ICD-10-CM | POA: Diagnosis not present

## 2012-08-20 DIAGNOSIS — I82409 Acute embolism and thrombosis of unspecified deep veins of unspecified lower extremity: Secondary | ICD-10-CM | POA: Diagnosis not present

## 2012-08-22 DIAGNOSIS — I82409 Acute embolism and thrombosis of unspecified deep veins of unspecified lower extremity: Secondary | ICD-10-CM | POA: Diagnosis not present

## 2012-08-27 DIAGNOSIS — I82409 Acute embolism and thrombosis of unspecified deep veins of unspecified lower extremity: Secondary | ICD-10-CM | POA: Diagnosis not present

## 2012-09-05 DIAGNOSIS — L0100 Impetigo, unspecified: Secondary | ICD-10-CM | POA: Diagnosis not present

## 2012-09-06 DIAGNOSIS — N189 Chronic kidney disease, unspecified: Secondary | ICD-10-CM | POA: Diagnosis not present

## 2012-09-06 DIAGNOSIS — E119 Type 2 diabetes mellitus without complications: Secondary | ICD-10-CM | POA: Diagnosis not present

## 2012-09-06 DIAGNOSIS — E782 Mixed hyperlipidemia: Secondary | ICD-10-CM | POA: Diagnosis not present

## 2012-09-06 DIAGNOSIS — I10 Essential (primary) hypertension: Secondary | ICD-10-CM | POA: Diagnosis not present

## 2012-09-06 DIAGNOSIS — I82409 Acute embolism and thrombosis of unspecified deep veins of unspecified lower extremity: Secondary | ICD-10-CM | POA: Diagnosis not present

## 2012-09-11 DIAGNOSIS — I10 Essential (primary) hypertension: Secondary | ICD-10-CM | POA: Diagnosis not present

## 2012-09-11 DIAGNOSIS — M1990 Unspecified osteoarthritis, unspecified site: Secondary | ICD-10-CM | POA: Diagnosis not present

## 2012-09-11 DIAGNOSIS — N189 Chronic kidney disease, unspecified: Secondary | ICD-10-CM | POA: Diagnosis not present

## 2012-09-11 DIAGNOSIS — E119 Type 2 diabetes mellitus without complications: Secondary | ICD-10-CM | POA: Diagnosis not present

## 2012-09-11 DIAGNOSIS — E1142 Type 2 diabetes mellitus with diabetic polyneuropathy: Secondary | ICD-10-CM | POA: Diagnosis not present

## 2012-09-11 DIAGNOSIS — M353 Polymyalgia rheumatica: Secondary | ICD-10-CM | POA: Diagnosis not present

## 2012-09-11 DIAGNOSIS — E1149 Type 2 diabetes mellitus with other diabetic neurological complication: Secondary | ICD-10-CM | POA: Diagnosis not present

## 2012-09-11 DIAGNOSIS — I82409 Acute embolism and thrombosis of unspecified deep veins of unspecified lower extremity: Secondary | ICD-10-CM | POA: Diagnosis not present

## 2012-09-11 DIAGNOSIS — L851 Acquired keratosis [keratoderma] palmaris et plantaris: Secondary | ICD-10-CM | POA: Diagnosis not present

## 2012-09-11 DIAGNOSIS — E782 Mixed hyperlipidemia: Secondary | ICD-10-CM | POA: Diagnosis not present

## 2012-09-11 DIAGNOSIS — L609 Nail disorder, unspecified: Secondary | ICD-10-CM | POA: Diagnosis not present

## 2012-09-27 DIAGNOSIS — I82409 Acute embolism and thrombosis of unspecified deep veins of unspecified lower extremity: Secondary | ICD-10-CM | POA: Diagnosis not present

## 2012-10-11 DIAGNOSIS — I82409 Acute embolism and thrombosis of unspecified deep veins of unspecified lower extremity: Secondary | ICD-10-CM | POA: Diagnosis not present

## 2012-10-26 DIAGNOSIS — I82409 Acute embolism and thrombosis of unspecified deep veins of unspecified lower extremity: Secondary | ICD-10-CM | POA: Diagnosis not present

## 2012-10-31 DIAGNOSIS — R51 Headache: Secondary | ICD-10-CM | POA: Diagnosis not present

## 2012-10-31 DIAGNOSIS — G9389 Other specified disorders of brain: Secondary | ICD-10-CM | POA: Diagnosis not present

## 2012-10-31 DIAGNOSIS — N183 Chronic kidney disease, stage 3 unspecified: Secondary | ICD-10-CM | POA: Diagnosis not present

## 2012-10-31 DIAGNOSIS — S79919A Unspecified injury of unspecified hip, initial encounter: Secondary | ICD-10-CM | POA: Diagnosis not present

## 2012-10-31 DIAGNOSIS — E875 Hyperkalemia: Secondary | ICD-10-CM | POA: Diagnosis not present

## 2012-10-31 DIAGNOSIS — E1129 Type 2 diabetes mellitus with other diabetic kidney complication: Secondary | ICD-10-CM | POA: Diagnosis not present

## 2012-10-31 DIAGNOSIS — I129 Hypertensive chronic kidney disease with stage 1 through stage 4 chronic kidney disease, or unspecified chronic kidney disease: Secondary | ICD-10-CM | POA: Diagnosis not present

## 2012-10-31 DIAGNOSIS — H3500 Unspecified background retinopathy: Secondary | ICD-10-CM | POA: Diagnosis not present

## 2012-10-31 DIAGNOSIS — W19XXXA Unspecified fall, initial encounter: Secondary | ICD-10-CM | POA: Diagnosis not present

## 2012-10-31 DIAGNOSIS — N058 Unspecified nephritic syndrome with other morphologic changes: Secondary | ICD-10-CM | POA: Diagnosis not present

## 2012-10-31 DIAGNOSIS — I82A29 Chronic embolism and thrombosis of unspecified axillary vein: Secondary | ICD-10-CM | POA: Diagnosis not present

## 2012-10-31 DIAGNOSIS — N189 Chronic kidney disease, unspecified: Secondary | ICD-10-CM | POA: Diagnosis not present

## 2012-10-31 DIAGNOSIS — E1139 Type 2 diabetes mellitus with other diabetic ophthalmic complication: Secondary | ICD-10-CM | POA: Diagnosis not present

## 2012-11-06 DIAGNOSIS — D044 Carcinoma in situ of skin of scalp and neck: Secondary | ICD-10-CM | POA: Diagnosis not present

## 2012-11-06 DIAGNOSIS — D485 Neoplasm of uncertain behavior of skin: Secondary | ICD-10-CM | POA: Diagnosis not present

## 2012-11-06 DIAGNOSIS — Z85828 Personal history of other malignant neoplasm of skin: Secondary | ICD-10-CM | POA: Diagnosis not present

## 2012-11-06 DIAGNOSIS — L570 Actinic keratosis: Secondary | ICD-10-CM | POA: Diagnosis not present

## 2012-11-06 DIAGNOSIS — D0439 Carcinoma in situ of skin of other parts of face: Secondary | ICD-10-CM | POA: Diagnosis not present

## 2012-11-06 DIAGNOSIS — L905 Scar conditions and fibrosis of skin: Secondary | ICD-10-CM | POA: Diagnosis not present

## 2012-11-08 DIAGNOSIS — I82409 Acute embolism and thrombosis of unspecified deep veins of unspecified lower extremity: Secondary | ICD-10-CM | POA: Diagnosis not present

## 2012-11-12 ENCOUNTER — Encounter: Payer: Self-pay | Admitting: Cardiology

## 2012-11-12 DIAGNOSIS — N179 Acute kidney failure, unspecified: Secondary | ICD-10-CM | POA: Diagnosis not present

## 2012-11-12 DIAGNOSIS — Z86718 Personal history of other venous thrombosis and embolism: Secondary | ICD-10-CM | POA: Diagnosis not present

## 2012-11-12 DIAGNOSIS — D649 Anemia, unspecified: Secondary | ICD-10-CM | POA: Diagnosis not present

## 2012-11-12 DIAGNOSIS — Z79899 Other long term (current) drug therapy: Secondary | ICD-10-CM | POA: Diagnosis not present

## 2012-11-12 DIAGNOSIS — I824Y9 Acute embolism and thrombosis of unspecified deep veins of unspecified proximal lower extremity: Secondary | ICD-10-CM | POA: Diagnosis not present

## 2012-11-12 DIAGNOSIS — S301XXA Contusion of abdominal wall, initial encounter: Secondary | ICD-10-CM | POA: Diagnosis not present

## 2012-11-12 DIAGNOSIS — S37009A Unspecified injury of unspecified kidney, initial encounter: Secondary | ICD-10-CM | POA: Diagnosis not present

## 2012-11-12 DIAGNOSIS — Z7901 Long term (current) use of anticoagulants: Secondary | ICD-10-CM | POA: Diagnosis not present

## 2012-11-12 DIAGNOSIS — M549 Dorsalgia, unspecified: Secondary | ICD-10-CM | POA: Diagnosis not present

## 2012-11-12 DIAGNOSIS — R6889 Other general symptoms and signs: Secondary | ICD-10-CM | POA: Diagnosis not present

## 2012-11-12 DIAGNOSIS — D62 Acute posthemorrhagic anemia: Secondary | ICD-10-CM | POA: Diagnosis not present

## 2012-11-12 DIAGNOSIS — S3011XA Contusion of abdominal wall, initial encounter: Secondary | ICD-10-CM | POA: Diagnosis not present

## 2012-11-13 DIAGNOSIS — D62 Acute posthemorrhagic anemia: Secondary | ICD-10-CM | POA: Diagnosis not present

## 2012-11-13 DIAGNOSIS — Z7901 Long term (current) use of anticoagulants: Secondary | ICD-10-CM | POA: Diagnosis not present

## 2012-11-13 DIAGNOSIS — I824Y9 Acute embolism and thrombosis of unspecified deep veins of unspecified proximal lower extremity: Secondary | ICD-10-CM | POA: Diagnosis not present

## 2012-11-13 DIAGNOSIS — D649 Anemia, unspecified: Secondary | ICD-10-CM | POA: Diagnosis not present

## 2012-11-13 DIAGNOSIS — M549 Dorsalgia, unspecified: Secondary | ICD-10-CM | POA: Diagnosis not present

## 2012-11-13 DIAGNOSIS — S301XXA Contusion of abdominal wall, initial encounter: Secondary | ICD-10-CM | POA: Diagnosis not present

## 2012-11-13 DIAGNOSIS — I82409 Acute embolism and thrombosis of unspecified deep veins of unspecified lower extremity: Secondary | ICD-10-CM | POA: Diagnosis not present

## 2012-11-13 DIAGNOSIS — Z86718 Personal history of other venous thrombosis and embolism: Secondary | ICD-10-CM | POA: Diagnosis not present

## 2012-11-13 DIAGNOSIS — K219 Gastro-esophageal reflux disease without esophagitis: Secondary | ICD-10-CM | POA: Diagnosis present

## 2012-11-13 DIAGNOSIS — D500 Iron deficiency anemia secondary to blood loss (chronic): Secondary | ICD-10-CM | POA: Diagnosis present

## 2012-11-13 DIAGNOSIS — R319 Hematuria, unspecified: Secondary | ICD-10-CM | POA: Diagnosis present

## 2012-11-13 DIAGNOSIS — N183 Chronic kidney disease, stage 3 unspecified: Secondary | ICD-10-CM | POA: Diagnosis not present

## 2012-11-13 DIAGNOSIS — I129 Hypertensive chronic kidney disease with stage 1 through stage 4 chronic kidney disease, or unspecified chronic kidney disease: Secondary | ICD-10-CM | POA: Diagnosis not present

## 2012-11-13 DIAGNOSIS — E119 Type 2 diabetes mellitus without complications: Secondary | ICD-10-CM | POA: Diagnosis not present

## 2012-11-13 DIAGNOSIS — R52 Pain, unspecified: Secondary | ICD-10-CM | POA: Diagnosis not present

## 2012-11-13 DIAGNOSIS — N179 Acute kidney failure, unspecified: Secondary | ICD-10-CM | POA: Diagnosis not present

## 2012-11-13 DIAGNOSIS — S20219A Contusion of unspecified front wall of thorax, initial encounter: Secondary | ICD-10-CM | POA: Diagnosis not present

## 2012-11-20 DIAGNOSIS — E1149 Type 2 diabetes mellitus with other diabetic neurological complication: Secondary | ICD-10-CM | POA: Diagnosis not present

## 2012-11-20 DIAGNOSIS — D649 Anemia, unspecified: Secondary | ICD-10-CM | POA: Diagnosis not present

## 2012-11-20 DIAGNOSIS — S301XXA Contusion of abdominal wall, initial encounter: Secondary | ICD-10-CM | POA: Diagnosis not present

## 2012-11-20 DIAGNOSIS — Z23 Encounter for immunization: Secondary | ICD-10-CM | POA: Diagnosis not present

## 2012-11-20 DIAGNOSIS — E538 Deficiency of other specified B group vitamins: Secondary | ICD-10-CM | POA: Diagnosis not present

## 2012-11-20 DIAGNOSIS — IMO0001 Reserved for inherently not codable concepts without codable children: Secondary | ICD-10-CM | POA: Diagnosis not present

## 2012-11-22 DIAGNOSIS — L570 Actinic keratosis: Secondary | ICD-10-CM | POA: Diagnosis not present

## 2012-11-22 DIAGNOSIS — C4442 Squamous cell carcinoma of skin of scalp and neck: Secondary | ICD-10-CM | POA: Diagnosis not present

## 2012-11-22 DIAGNOSIS — C44320 Squamous cell carcinoma of skin of unspecified parts of face: Secondary | ICD-10-CM | POA: Diagnosis not present

## 2012-11-23 DIAGNOSIS — M6281 Muscle weakness (generalized): Secondary | ICD-10-CM | POA: Diagnosis not present

## 2012-11-23 DIAGNOSIS — IMO0001 Reserved for inherently not codable concepts without codable children: Secondary | ICD-10-CM | POA: Diagnosis not present

## 2012-11-23 DIAGNOSIS — R262 Difficulty in walking, not elsewhere classified: Secondary | ICD-10-CM | POA: Diagnosis not present

## 2012-11-23 DIAGNOSIS — M545 Low back pain, unspecified: Secondary | ICD-10-CM | POA: Diagnosis not present

## 2012-11-28 DIAGNOSIS — R262 Difficulty in walking, not elsewhere classified: Secondary | ICD-10-CM | POA: Diagnosis not present

## 2012-11-28 DIAGNOSIS — M6281 Muscle weakness (generalized): Secondary | ICD-10-CM | POA: Diagnosis not present

## 2012-11-28 DIAGNOSIS — IMO0001 Reserved for inherently not codable concepts without codable children: Secondary | ICD-10-CM | POA: Diagnosis not present

## 2012-11-28 DIAGNOSIS — M545 Low back pain, unspecified: Secondary | ICD-10-CM | POA: Diagnosis not present

## 2012-12-04 DIAGNOSIS — Z5189 Encounter for other specified aftercare: Secondary | ICD-10-CM | POA: Diagnosis not present

## 2012-12-13 DIAGNOSIS — M48061 Spinal stenosis, lumbar region without neurogenic claudication: Secondary | ICD-10-CM | POA: Diagnosis not present

## 2012-12-15 DIAGNOSIS — E1149 Type 2 diabetes mellitus with other diabetic neurological complication: Secondary | ICD-10-CM | POA: Diagnosis not present

## 2012-12-15 DIAGNOSIS — S301XXA Contusion of abdominal wall, initial encounter: Secondary | ICD-10-CM | POA: Diagnosis not present

## 2012-12-17 ENCOUNTER — Other Ambulatory Visit: Payer: Self-pay | Admitting: Orthopedic Surgery

## 2012-12-17 DIAGNOSIS — R29898 Other symptoms and signs involving the musculoskeletal system: Secondary | ICD-10-CM

## 2012-12-17 DIAGNOSIS — M79604 Pain in right leg: Secondary | ICD-10-CM

## 2012-12-19 ENCOUNTER — Ambulatory Visit
Admission: RE | Admit: 2012-12-19 | Discharge: 2012-12-19 | Disposition: A | Discharge status: Home / Self Care | Payer: Medicare Other | Source: Ambulatory Visit | Attending: Orthopedic Surgery | Admitting: Orthopedic Surgery

## 2012-12-19 DIAGNOSIS — M5126 Other intervertebral disc displacement, lumbar region: Secondary | ICD-10-CM | POA: Diagnosis not present

## 2012-12-19 DIAGNOSIS — M79604 Pain in right leg: Secondary | ICD-10-CM

## 2012-12-19 DIAGNOSIS — R29898 Other symptoms and signs involving the musculoskeletal system: Secondary | ICD-10-CM

## 2012-12-19 MED ORDER — ONDANSETRON HCL 4 MG/2ML IJ SOLN: 4.0000 mg | Freq: Four times a day (QID) | INTRAMUSCULAR | Status: DC | PRN

## 2012-12-19 MED ORDER — IOHEXOL 180 MG/ML  SOLN
18.0000 mL | Freq: Once | INTRAMUSCULAR | Status: AC | PRN
  Administered 2012-12-19: 18 mL via INTRATHECAL

## 2012-12-19 NOTE — Progress Notes (Signed)
Pt has skin tear from rolling to mix contrast up for CT.  Skin above left elbow about the size of a quarter is torn. vaseline and 4x4 cover to area and secured with coban.

## 2012-12-19 NOTE — Progress Notes (Signed)
Patient states he has been off Warfarin since 11/12/12.

## 2012-12-25 DIAGNOSIS — I10 Essential (primary) hypertension: Secondary | ICD-10-CM | POA: Diagnosis not present

## 2012-12-25 DIAGNOSIS — M48061 Spinal stenosis, lumbar region without neurogenic claudication: Secondary | ICD-10-CM | POA: Diagnosis not present

## 2012-12-26 DIAGNOSIS — I82409 Acute embolism and thrombosis of unspecified deep veins of unspecified lower extremity: Secondary | ICD-10-CM | POA: Diagnosis not present

## 2012-12-26 DIAGNOSIS — D6859 Other primary thrombophilia: Secondary | ICD-10-CM | POA: Diagnosis not present

## 2012-12-28 DIAGNOSIS — I82409 Acute embolism and thrombosis of unspecified deep veins of unspecified lower extremity: Secondary | ICD-10-CM | POA: Diagnosis not present

## 2012-12-31 DIAGNOSIS — I82409 Acute embolism and thrombosis of unspecified deep veins of unspecified lower extremity: Secondary | ICD-10-CM | POA: Diagnosis not present

## 2013-01-01 DIAGNOSIS — L851 Acquired keratosis [keratoderma] palmaris et plantaris: Secondary | ICD-10-CM | POA: Diagnosis not present

## 2013-01-01 DIAGNOSIS — E1149 Type 2 diabetes mellitus with other diabetic neurological complication: Secondary | ICD-10-CM | POA: Diagnosis not present

## 2013-01-01 DIAGNOSIS — B351 Tinea unguium: Secondary | ICD-10-CM | POA: Diagnosis not present

## 2013-01-14 DIAGNOSIS — M48061 Spinal stenosis, lumbar region without neurogenic claudication: Secondary | ICD-10-CM | POA: Diagnosis not present

## 2013-01-23 DIAGNOSIS — D649 Anemia, unspecified: Secondary | ICD-10-CM | POA: Diagnosis not present

## 2013-01-23 DIAGNOSIS — E782 Mixed hyperlipidemia: Secondary | ICD-10-CM | POA: Diagnosis not present

## 2013-01-23 DIAGNOSIS — I82409 Acute embolism and thrombosis of unspecified deep veins of unspecified lower extremity: Secondary | ICD-10-CM | POA: Diagnosis not present

## 2013-01-23 DIAGNOSIS — E119 Type 2 diabetes mellitus without complications: Secondary | ICD-10-CM | POA: Diagnosis not present

## 2013-01-23 DIAGNOSIS — M353 Polymyalgia rheumatica: Secondary | ICD-10-CM | POA: Diagnosis not present

## 2013-01-23 DIAGNOSIS — M1990 Unspecified osteoarthritis, unspecified site: Secondary | ICD-10-CM | POA: Diagnosis not present

## 2013-01-23 DIAGNOSIS — I10 Essential (primary) hypertension: Secondary | ICD-10-CM | POA: Diagnosis not present

## 2013-01-31 DIAGNOSIS — E1149 Type 2 diabetes mellitus with other diabetic neurological complication: Secondary | ICD-10-CM | POA: Diagnosis not present

## 2013-01-31 DIAGNOSIS — M353 Polymyalgia rheumatica: Secondary | ICD-10-CM | POA: Diagnosis not present

## 2013-01-31 DIAGNOSIS — E1142 Type 2 diabetes mellitus with diabetic polyneuropathy: Secondary | ICD-10-CM | POA: Diagnosis not present

## 2013-01-31 DIAGNOSIS — I82409 Acute embolism and thrombosis of unspecified deep veins of unspecified lower extremity: Secondary | ICD-10-CM | POA: Diagnosis not present

## 2013-01-31 DIAGNOSIS — M1990 Unspecified osteoarthritis, unspecified site: Secondary | ICD-10-CM | POA: Diagnosis not present

## 2013-01-31 DIAGNOSIS — E782 Mixed hyperlipidemia: Secondary | ICD-10-CM | POA: Diagnosis not present

## 2013-01-31 DIAGNOSIS — I10 Essential (primary) hypertension: Secondary | ICD-10-CM | POA: Diagnosis not present

## 2013-02-04 DIAGNOSIS — Z85828 Personal history of other malignant neoplasm of skin: Secondary | ICD-10-CM | POA: Diagnosis not present

## 2013-02-04 DIAGNOSIS — D044 Carcinoma in situ of skin of scalp and neck: Secondary | ICD-10-CM | POA: Diagnosis not present

## 2013-02-04 DIAGNOSIS — C4442 Squamous cell carcinoma of skin of scalp and neck: Secondary | ICD-10-CM | POA: Diagnosis not present

## 2013-02-04 DIAGNOSIS — D485 Neoplasm of uncertain behavior of skin: Secondary | ICD-10-CM | POA: Diagnosis not present

## 2013-02-04 DIAGNOSIS — L570 Actinic keratosis: Secondary | ICD-10-CM | POA: Diagnosis not present

## 2013-02-07 DIAGNOSIS — I82409 Acute embolism and thrombosis of unspecified deep veins of unspecified lower extremity: Secondary | ICD-10-CM | POA: Diagnosis not present

## 2013-02-14 DIAGNOSIS — D481 Neoplasm of uncertain behavior of connective and other soft tissue: Secondary | ICD-10-CM | POA: Diagnosis not present

## 2013-02-14 DIAGNOSIS — C4440 Unspecified malignant neoplasm of skin of scalp and neck: Secondary | ICD-10-CM | POA: Diagnosis not present

## 2013-02-15 DIAGNOSIS — I82409 Acute embolism and thrombosis of unspecified deep veins of unspecified lower extremity: Secondary | ICD-10-CM | POA: Diagnosis not present

## 2013-02-27 DIAGNOSIS — C4442 Squamous cell carcinoma of skin of scalp and neck: Secondary | ICD-10-CM | POA: Diagnosis not present

## 2013-03-12 DIAGNOSIS — E1149 Type 2 diabetes mellitus with other diabetic neurological complication: Secondary | ICD-10-CM | POA: Diagnosis not present

## 2013-03-12 DIAGNOSIS — B351 Tinea unguium: Secondary | ICD-10-CM | POA: Diagnosis not present

## 2013-03-12 DIAGNOSIS — L851 Acquired keratosis [keratoderma] palmaris et plantaris: Secondary | ICD-10-CM | POA: Diagnosis not present

## 2013-03-19 DIAGNOSIS — I82409 Acute embolism and thrombosis of unspecified deep veins of unspecified lower extremity: Secondary | ICD-10-CM | POA: Diagnosis not present

## 2013-04-19 DIAGNOSIS — I82409 Acute embolism and thrombosis of unspecified deep veins of unspecified lower extremity: Secondary | ICD-10-CM | POA: Diagnosis not present

## 2013-05-06 DIAGNOSIS — L570 Actinic keratosis: Secondary | ICD-10-CM | POA: Diagnosis not present

## 2013-05-22 DIAGNOSIS — I82409 Acute embolism and thrombosis of unspecified deep veins of unspecified lower extremity: Secondary | ICD-10-CM | POA: Diagnosis not present

## 2013-05-28 DIAGNOSIS — IMO0001 Reserved for inherently not codable concepts without codable children: Secondary | ICD-10-CM | POA: Diagnosis not present

## 2013-05-28 DIAGNOSIS — I10 Essential (primary) hypertension: Secondary | ICD-10-CM | POA: Diagnosis not present

## 2013-05-28 DIAGNOSIS — E782 Mixed hyperlipidemia: Secondary | ICD-10-CM | POA: Diagnosis not present

## 2013-05-28 DIAGNOSIS — D649 Anemia, unspecified: Secondary | ICD-10-CM | POA: Diagnosis not present

## 2013-06-06 DIAGNOSIS — M1990 Unspecified osteoarthritis, unspecified site: Secondary | ICD-10-CM | POA: Diagnosis not present

## 2013-06-06 DIAGNOSIS — I10 Essential (primary) hypertension: Secondary | ICD-10-CM | POA: Diagnosis not present

## 2013-06-06 DIAGNOSIS — E782 Mixed hyperlipidemia: Secondary | ICD-10-CM | POA: Diagnosis not present

## 2013-06-06 DIAGNOSIS — E1142 Type 2 diabetes mellitus with diabetic polyneuropathy: Secondary | ICD-10-CM | POA: Diagnosis not present

## 2013-06-06 DIAGNOSIS — E1149 Type 2 diabetes mellitus with other diabetic neurological complication: Secondary | ICD-10-CM | POA: Diagnosis not present

## 2013-06-06 DIAGNOSIS — M353 Polymyalgia rheumatica: Secondary | ICD-10-CM | POA: Diagnosis not present

## 2013-06-06 DIAGNOSIS — K2100 Gastro-esophageal reflux disease with esophagitis, without bleeding: Secondary | ICD-10-CM | POA: Diagnosis not present

## 2013-06-06 DIAGNOSIS — I82409 Acute embolism and thrombosis of unspecified deep veins of unspecified lower extremity: Secondary | ICD-10-CM | POA: Diagnosis not present

## 2013-06-21 DIAGNOSIS — I82409 Acute embolism and thrombosis of unspecified deep veins of unspecified lower extremity: Secondary | ICD-10-CM | POA: Diagnosis not present

## 2013-06-28 DIAGNOSIS — I82409 Acute embolism and thrombosis of unspecified deep veins of unspecified lower extremity: Secondary | ICD-10-CM | POA: Diagnosis not present

## 2013-07-28 DIAGNOSIS — I10 Essential (primary) hypertension: Secondary | ICD-10-CM | POA: Diagnosis not present

## 2013-07-28 DIAGNOSIS — S51809A Unspecified open wound of unspecified forearm, initial encounter: Secondary | ICD-10-CM | POA: Diagnosis not present

## 2013-07-28 DIAGNOSIS — E119 Type 2 diabetes mellitus without complications: Secondary | ICD-10-CM | POA: Diagnosis not present

## 2013-07-28 DIAGNOSIS — K219 Gastro-esophageal reflux disease without esophagitis: Secondary | ICD-10-CM | POA: Diagnosis not present

## 2013-07-28 DIAGNOSIS — Z7901 Long term (current) use of anticoagulants: Secondary | ICD-10-CM | POA: Diagnosis not present

## 2013-07-28 DIAGNOSIS — Z833 Family history of diabetes mellitus: Secondary | ICD-10-CM | POA: Diagnosis not present

## 2013-07-28 DIAGNOSIS — Z86718 Personal history of other venous thrombosis and embolism: Secondary | ICD-10-CM | POA: Diagnosis not present

## 2013-07-28 DIAGNOSIS — Z8249 Family history of ischemic heart disease and other diseases of the circulatory system: Secondary | ICD-10-CM | POA: Diagnosis not present

## 2013-07-28 DIAGNOSIS — Z79899 Other long term (current) drug therapy: Secondary | ICD-10-CM | POA: Diagnosis not present

## 2013-07-28 DIAGNOSIS — E7800 Pure hypercholesterolemia, unspecified: Secondary | ICD-10-CM | POA: Diagnosis not present

## 2013-07-28 DIAGNOSIS — W19XXXA Unspecified fall, initial encounter: Secondary | ICD-10-CM | POA: Diagnosis not present

## 2013-07-28 DIAGNOSIS — Y92009 Unspecified place in unspecified non-institutional (private) residence as the place of occurrence of the external cause: Secondary | ICD-10-CM | POA: Diagnosis not present

## 2013-07-29 DIAGNOSIS — D0420 Carcinoma in situ of skin of unspecified ear and external auricular canal: Secondary | ICD-10-CM | POA: Diagnosis not present

## 2013-07-29 DIAGNOSIS — D485 Neoplasm of uncertain behavior of skin: Secondary | ICD-10-CM | POA: Diagnosis not present

## 2013-07-29 DIAGNOSIS — Z85828 Personal history of other malignant neoplasm of skin: Secondary | ICD-10-CM | POA: Diagnosis not present

## 2013-07-29 DIAGNOSIS — I82409 Acute embolism and thrombosis of unspecified deep veins of unspecified lower extremity: Secondary | ICD-10-CM | POA: Diagnosis not present

## 2013-07-29 DIAGNOSIS — C44320 Squamous cell carcinoma of skin of unspecified parts of face: Secondary | ICD-10-CM | POA: Diagnosis not present

## 2013-07-29 DIAGNOSIS — L570 Actinic keratosis: Secondary | ICD-10-CM | POA: Diagnosis not present

## 2013-07-31 DIAGNOSIS — S51809A Unspecified open wound of unspecified forearm, initial encounter: Secondary | ICD-10-CM | POA: Diagnosis not present

## 2013-08-13 DIAGNOSIS — IMO0002 Reserved for concepts with insufficient information to code with codable children: Secondary | ICD-10-CM | POA: Diagnosis not present

## 2013-08-13 DIAGNOSIS — B351 Tinea unguium: Secondary | ICD-10-CM | POA: Diagnosis not present

## 2013-08-13 DIAGNOSIS — L851 Acquired keratosis [keratoderma] palmaris et plantaris: Secondary | ICD-10-CM | POA: Diagnosis not present

## 2013-08-13 DIAGNOSIS — E1149 Type 2 diabetes mellitus with other diabetic neurological complication: Secondary | ICD-10-CM | POA: Diagnosis not present

## 2013-08-15 DIAGNOSIS — C44221 Squamous cell carcinoma of skin of unspecified ear and external auricular canal: Secondary | ICD-10-CM | POA: Diagnosis not present

## 2013-08-15 DIAGNOSIS — C44320 Squamous cell carcinoma of skin of unspecified parts of face: Secondary | ICD-10-CM | POA: Diagnosis not present

## 2013-08-26 DIAGNOSIS — I82409 Acute embolism and thrombosis of unspecified deep veins of unspecified lower extremity: Secondary | ICD-10-CM | POA: Diagnosis not present

## 2013-09-25 DIAGNOSIS — I82409 Acute embolism and thrombosis of unspecified deep veins of unspecified lower extremity: Secondary | ICD-10-CM | POA: Diagnosis not present

## 2013-10-09 ENCOUNTER — Ambulatory Visit (INDEPENDENT_AMBULATORY_CARE_PROVIDER_SITE_OTHER): Payer: Medicare Other | Admitting: Cardiology

## 2013-10-09 ENCOUNTER — Encounter: Payer: Self-pay | Admitting: *Deleted

## 2013-10-09 ENCOUNTER — Encounter: Payer: Self-pay | Admitting: Cardiology

## 2013-10-09 VITALS — BP 130/66 | HR 60 | Ht 70.0 in | Wt 221.0 lb

## 2013-10-09 DIAGNOSIS — E785 Hyperlipidemia, unspecified: Secondary | ICD-10-CM | POA: Diagnosis not present

## 2013-10-09 DIAGNOSIS — R0602 Shortness of breath: Secondary | ICD-10-CM | POA: Diagnosis not present

## 2013-10-09 DIAGNOSIS — I10 Essential (primary) hypertension: Secondary | ICD-10-CM

## 2013-10-09 DIAGNOSIS — I82409 Acute embolism and thrombosis of unspecified deep veins of unspecified lower extremity: Secondary | ICD-10-CM

## 2013-10-09 DIAGNOSIS — I4891 Unspecified atrial fibrillation: Secondary | ICD-10-CM | POA: Diagnosis not present

## 2013-10-09 DIAGNOSIS — I480 Paroxysmal atrial fibrillation: Secondary | ICD-10-CM

## 2013-10-09 NOTE — Assessment & Plan Note (Signed)
Noted predominantly with exertion over the last several months. No chest pain reported. ECG is nonspecific overall, he remains in sinus rhythm. He has not had recent cardiac structural or ischemic workup, at least within the last 3 years. We will arrange a complete echocardiogram as well as Lexiscan Cardiolite, office followup arranged to review.

## 2013-10-09 NOTE — Assessment & Plan Note (Signed)
Blood pressure control is good today. 

## 2013-10-09 NOTE — Progress Notes (Signed)
[place] Summary Don Hall is an 80 y.o.male former patient of Dr. Percival Spanish, last seen in January 2014, referred back to the office by Dr. Quillian Quince. He reports a feeling of increased shortness of breath with activity over the last several months, no exertional chest pain however. He is also not experiencing any palpitations.  He reports undergoing a bedside echocardiogram by Dr. Dannielle Burn approximately 3 years ago, reportedly looked "okay." Unfortunately, I am not able to see any of these images. Last ischemic workup was at least 3 or 4 years ago, I cannot access these results either.  ECG today shows sinus rhythm with PACs, low voltage in the precordial leads.  Coumadin is followed by Dr. Quillian Quince. He denies any bleeding problems.   No Known Allergies  Current Outpatient Prescriptions  Medication Sig Dispense Refill  . atorvastatin (LIPITOR) 10 MG tablet Take 10 mg by mouth daily.      . carboxymethylcellulose (REFRESH) 1 % ophthalmic solution Apply 1 drop to eye daily as needed. DRY EYES       . glipiZIDE (GLUCOTROL) 10 MG tablet Take 5 mg by mouth 2 (two) times daily.       . Horse Chestnut (VENASTAT) 300 MG CPCR Take 1 capsule by mouth daily.       Marland Kitchen lisinopril-hydrochlorothiazide (PRINZIDE,ZESTORETIC) 20-12.5 MG per tablet Take 1.5 tablets by mouth every morning.       . Multiple Vitamins-Minerals (PRESERVISION AREDS 2 PO) Take 1 tablet by mouth 2 (two) times daily.       Marland Kitchen omeprazole (PRILOSEC) 40 MG capsule Take 40 mg by mouth 2 (two) times daily.       . predniSONE (DELTASONE) 5 MG tablet Take 5 mg by mouth every morning.       . warfarin (COUMADIN) 5 MG tablet Take 1 tablet (5 mg total) by mouth every evening.  90 tablet  3   No current facility-administered medications for this visit.    Past Medical History  Diagnosis Date  . Essential hypertension, benign   . Hyperlipidemia   . Type 2 diabetes mellitus   . History of DVT of lower extremity 2007    Recurrent X  4, last one 01/2010 (after spontaneous hemorrhage of thigh)  . Hemorrhage 01/2010    Spontaneous hemorrhage of right thigh with possible over anticoagulation  . Paroxysmal atrial fibrillation   . GERD (gastroesophageal reflux disease)   . Arthritis   . Spigelian hernia   . Renal calculus, right   . Renal cyst, left   . Diverticulosis     Social History Don Hall reports that he quit smoking about 55 years ago. His smoking use included Cigarettes. He has a 14 pack-year smoking history. He has never used smokeless tobacco. Don Hall reports that he does not drink alcohol.  Review of Systems No orthopnea or PND. Mild ankle edema. Arthritic discomfort at times. Feels that he has "slowed down" over the years. Other systems reviewed and negative.  Physical Examination Filed Vitals:   10/09/13 0815  BP: 130/66  Pulse: 60   Filed Weights   10/09/13 0815  Weight: 221 lb (100.245 kg)   Overweight male, appears comfortable at rest. HEENT: Conjunctiva and lids normal, oropharynx clear. Neck: Supple, no elevated JVP or carotid bruits, no thyromegaly. Lungs: Clear to auscultation, nonlabored breathing at rest. Cardiac: Regular rate and rhythm with ectopic beats, no S3, soft systolic murmur, no pericardial rub. Abdomen: Soft, nontender, bowel sounds present, no guarding or rebound. Extremities:  1+ leg edema and stasis, distal pulses 2+. Skin: Warm and dry. Musculoskeletal: No kyphosis. Neuropsychiatric: Alert and oriented x3, affect grossly appropriate.   Problem List and Plan   Shortness of breath Noted predominantly with exertion over the last several months. No chest pain reported. ECG is nonspecific overall, he remains in sinus rhythm. He has not had recent cardiac structural or ischemic workup, at least within the last 3 years. We will arrange a complete echocardiogram as well as Lexiscan Cardiolite, office followup arranged to review.  Paroxysmal atrial fibrillation Documented  remotely, he continues on Coumadin, remains in sinus rhythm today.  HYPERLIPIDEMIA-MIXED On Lipitor, followed by Dr. Quillian Quince.  Essential hypertension, benign Blood pressure control is good today.  DEEP VENOUS THROMBOPHLEBITIS, RECURRENT On chronic Coumadin.    Satira Sark, M.D., F.A.C.C.

## 2013-10-09 NOTE — Patient Instructions (Signed)
Your physician recommends that you schedule a follow-up appointment in: 1 month. Your physician recommends that you continue on your current medications as directed. Please refer to the Current Medication list given to you today. Your physician has requested that you have an echocardiogram. Echocardiography is a painless test that uses sound waves to create images of your heart. It provides your doctor with information about the size and shape of your heart and how well your heart's chambers and valves are working. This procedure takes approximately one hour. There are no restrictions for this procedure. Your physician has requested that you have a lexiscan myoview. For further information please visit [email or web address]. Please follow instruction sheet, as given.

## 2013-10-09 NOTE — Addendum Note (Signed)
Addended by: Merlene Laughter on: 10/09/2013 08:57 AM   Modules accepted: Orders

## 2013-10-09 NOTE — Assessment & Plan Note (Signed)
Documented remotely, he continues on Coumadin, remains in sinus rhythm today.

## 2013-10-09 NOTE — Assessment & Plan Note (Signed)
On Lipitor, followed by Dr. Quillian Quince.

## 2013-10-09 NOTE — Assessment & Plan Note (Addendum)
On chronic Coumadin. 

## 2013-10-14 DIAGNOSIS — E119 Type 2 diabetes mellitus without complications: Secondary | ICD-10-CM | POA: Diagnosis not present

## 2013-10-14 DIAGNOSIS — E782 Mixed hyperlipidemia: Secondary | ICD-10-CM | POA: Diagnosis not present

## 2013-10-14 DIAGNOSIS — N189 Chronic kidney disease, unspecified: Secondary | ICD-10-CM | POA: Diagnosis not present

## 2013-10-14 DIAGNOSIS — I82409 Acute embolism and thrombosis of unspecified deep veins of unspecified lower extremity: Secondary | ICD-10-CM | POA: Diagnosis not present

## 2013-10-14 DIAGNOSIS — I10 Essential (primary) hypertension: Secondary | ICD-10-CM | POA: Diagnosis not present

## 2013-10-16 ENCOUNTER — Ambulatory Visit (HOSPITAL_COMMUNITY)
Admission: RE | Admit: 2013-10-16 | Discharge: 2013-10-16 | Disposition: A | Discharge status: Home / Self Care | Payer: Medicare Other | Source: Ambulatory Visit | Attending: Cardiology | Admitting: Cardiology

## 2013-10-16 ENCOUNTER — Encounter (HOSPITAL_COMMUNITY)
Admission: RE | Admit: 2013-10-16 | Discharge: 2013-10-16 | Disposition: A | Discharge status: Home / Self Care | Payer: Medicare Other | Source: Ambulatory Visit | Attending: Cardiology | Admitting: Cardiology

## 2013-10-16 ENCOUNTER — Encounter (HOSPITAL_COMMUNITY): Payer: Self-pay

## 2013-10-16 DIAGNOSIS — R0602 Shortness of breath: Secondary | ICD-10-CM | POA: Insufficient documentation

## 2013-10-16 DIAGNOSIS — Z86718 Personal history of other venous thrombosis and embolism: Secondary | ICD-10-CM | POA: Diagnosis not present

## 2013-10-16 DIAGNOSIS — E119 Type 2 diabetes mellitus without complications: Secondary | ICD-10-CM | POA: Diagnosis not present

## 2013-10-16 DIAGNOSIS — E785 Hyperlipidemia, unspecified: Secondary | ICD-10-CM | POA: Insufficient documentation

## 2013-10-16 DIAGNOSIS — I4891 Unspecified atrial fibrillation: Secondary | ICD-10-CM | POA: Insufficient documentation

## 2013-10-16 DIAGNOSIS — I080 Rheumatic disorders of both mitral and aortic valves: Secondary | ICD-10-CM | POA: Insufficient documentation

## 2013-10-16 DIAGNOSIS — I10 Essential (primary) hypertension: Secondary | ICD-10-CM | POA: Diagnosis not present

## 2013-10-16 DIAGNOSIS — I059 Rheumatic mitral valve disease, unspecified: Secondary | ICD-10-CM | POA: Diagnosis not present

## 2013-10-16 MED ORDER — SODIUM CHLORIDE 0.9 % IJ SOLN
INTRAMUSCULAR | Status: AC
  Administered 2013-10-16: 10 mL via INTRAVENOUS
  Filled 2013-10-16: qty 10

## 2013-10-16 MED ORDER — TECHNETIUM TC 99M SESTAMIBI - CARDIOLITE
10.0000 | Freq: Once | INTRAVENOUS | Status: AC | PRN
  Administered 2013-10-16: 08:00:00 10 via INTRAVENOUS

## 2013-10-16 MED ORDER — REGADENOSON 0.4 MG/5ML IV SOLN
0.4000 mg | Freq: Once | INTRAVENOUS | Status: AC
  Administered 2013-10-16: 0.4 mg via INTRAVENOUS

## 2013-10-16 MED ORDER — REGADENOSON 0.4 MG/5ML IV SOLN
INTRAVENOUS | Status: AC
  Administered 2013-10-16: 0.4 mg via INTRAVENOUS
  Filled 2013-10-16: qty 5

## 2013-10-16 MED ORDER — SODIUM CHLORIDE 0.9 % IJ SOLN
10.0000 mL | INTRAMUSCULAR | Status: DC | PRN
  Administered 2013-10-16: 10 mL via INTRAVENOUS

## 2013-10-16 MED ORDER — TECHNETIUM TC 99M SESTAMIBI GENERIC - CARDIOLITE
30.0000 | Freq: Once | INTRAVENOUS | Status: AC | PRN
  Administered 2013-10-16: 30 via INTRAVENOUS

## 2013-10-16 NOTE — Progress Notes (Signed)
  Echocardiogram 2D Echocardiogram has been performed.  [place], [place] 10/16/2013, 11:23 AM

## 2013-10-16 NOTE — Progress Notes (Signed)
[facility] Nurses Notes - [place] 10/16/2013 Reason for doing test: SOB Type of test: Lexiscan/ Cardiolite Nurse performing test: Boneta Lucks RN Nuclear Medicine Tech: Melburn Hake Echo Tech: NA MD performing test: Maple Hudson Family MD: [name] Test explained and consent signed: Yes.   IV started: Saline lock started in radiology Symptoms: Flushed, warm feeling, Palpatations Treatment/Intervention: None Reason test stopped: protocol completed After recovery IV was: Discontinued via X-ray tech Patient to return to [place]. Med at : Patient discharged: Home Patient's Condition upon discharge was: stable Comments:  During procedure peak BP -160/57 HR 103 , Recovery BP149/59 HR 81, Symptoms resolved Shanaye Rief, Wells Guiles

## 2013-10-17 ENCOUNTER — Telehealth: Payer: Self-pay | Admitting: *Deleted

## 2013-10-17 NOTE — Telephone Encounter (Signed)
Pt notified of results. Will discuss further at f/u

## 2013-10-17 NOTE — Telephone Encounter (Signed)
Message copied by Orion Modest on Thu Oct 17, 2013 10:56 AM ------      Message from: MCDOWELL, Aloha Gell      Created: Thu Oct 17, 2013  9:23 AM       Reviewed. LVEF remains in normal range. Other findings as noted. Can review further at office followup. ------

## 2013-10-22 DIAGNOSIS — I82409 Acute embolism and thrombosis of unspecified deep veins of unspecified lower extremity: Secondary | ICD-10-CM | POA: Diagnosis not present

## 2013-10-22 DIAGNOSIS — E1142 Type 2 diabetes mellitus with diabetic polyneuropathy: Secondary | ICD-10-CM | POA: Diagnosis not present

## 2013-10-22 DIAGNOSIS — M1990 Unspecified osteoarthritis, unspecified site: Secondary | ICD-10-CM | POA: Diagnosis not present

## 2013-10-22 DIAGNOSIS — M353 Polymyalgia rheumatica: Secondary | ICD-10-CM | POA: Diagnosis not present

## 2013-10-22 DIAGNOSIS — E782 Mixed hyperlipidemia: Secondary | ICD-10-CM | POA: Diagnosis not present

## 2013-10-22 DIAGNOSIS — E1149 Type 2 diabetes mellitus with other diabetic neurological complication: Secondary | ICD-10-CM | POA: Diagnosis not present

## 2013-10-22 DIAGNOSIS — I10 Essential (primary) hypertension: Secondary | ICD-10-CM | POA: Diagnosis not present

## 2013-10-22 DIAGNOSIS — K2100 Gastro-esophageal reflux disease with esophagitis, without bleeding: Secondary | ICD-10-CM | POA: Diagnosis not present

## 2013-10-23 DIAGNOSIS — I82409 Acute embolism and thrombosis of unspecified deep veins of unspecified lower extremity: Secondary | ICD-10-CM | POA: Diagnosis not present

## 2013-11-05 DIAGNOSIS — E119 Type 2 diabetes mellitus without complications: Secondary | ICD-10-CM | POA: Diagnosis not present

## 2013-11-05 DIAGNOSIS — N189 Chronic kidney disease, unspecified: Secondary | ICD-10-CM | POA: Diagnosis not present

## 2013-11-05 DIAGNOSIS — I10 Essential (primary) hypertension: Secondary | ICD-10-CM | POA: Diagnosis not present

## 2013-11-13 ENCOUNTER — Encounter: Payer: Self-pay | Admitting: Cardiology

## 2013-11-13 ENCOUNTER — Ambulatory Visit (INDEPENDENT_AMBULATORY_CARE_PROVIDER_SITE_OTHER): Payer: Medicare Other | Admitting: Cardiology

## 2013-11-13 VITALS — BP 134/71 | HR 87 | Ht 70.0 in | Wt 225.0 lb

## 2013-11-13 DIAGNOSIS — R0602 Shortness of breath: Secondary | ICD-10-CM | POA: Diagnosis not present

## 2013-11-13 DIAGNOSIS — I10 Essential (primary) hypertension: Secondary | ICD-10-CM

## 2013-11-13 DIAGNOSIS — I480 Paroxysmal atrial fibrillation: Secondary | ICD-10-CM

## 2013-11-13 DIAGNOSIS — I4891 Unspecified atrial fibrillation: Secondary | ICD-10-CM | POA: Diagnosis not present

## 2013-11-13 NOTE — Assessment & Plan Note (Signed)
He has been in sinus rhythm recently and with recent testing, no clear reason to think that this is causing his shortness of breath.

## 2013-11-13 NOTE — Assessment & Plan Note (Signed)
No changes made to current regimen.

## 2013-11-13 NOTE — Progress Notes (Signed)
Clinical Summary Don Hall is an 80 y.o.male last seen in August. He was referred for followup cardiac testing for further evaluation of shortness of breath.  Echocardiogram from August reported mild LVH with LVEF [id number], grade 1 diastolic dysfunction, mild mitral regurgitation, trivial aortic regurgitation, moderate RV dilatation with normal contraction and prominent trabeculation toward the apex, mild tricuspid regurgitation with PASP 42 mm mercury. Lexiscan Cardiolite also done in August showed no diagnostic ST segment changes with rare PACs and PVCs, inferolateral soft tissue attenuation without clear evidence of scar or ischemia, LVEF 67%.  I reviewed the results of the testing with him today. Overall reassuring findings. He still reports exertional dyspnea and gradual pattern of the last year. States he has had "bronchitis" a few times, no known history of reactive airways disease. Still does not indicate any chest pressure or heaviness.   No Known Allergies  Current Outpatient Prescriptions  Medication Sig Dispense Refill  . atorvastatin (LIPITOR) 10 MG tablet Take 10 mg by mouth daily.      . carboxymethylcellulose (REFRESH) 1 % ophthalmic solution Apply 1 drop to eye daily as needed. DRY EYES       . glipiZIDE (GLUCOTROL) 10 MG tablet Take 5 mg by mouth 2 (two) times daily.       . Horse Chestnut (VENASTAT) 300 MG CPCR Take 1 capsule by mouth daily.       Marland Kitchen lisinopril-hydrochlorothiazide (PRINZIDE,ZESTORETIC) 20-12.5 MG per tablet Take 1.5 tablets by mouth every morning.       . Multiple Vitamins-Minerals (PRESERVISION AREDS 2 PO) Take 1 tablet by mouth 2 (two) times daily.       Marland Kitchen omeprazole (PRILOSEC) 40 MG capsule Take 40 mg by mouth 2 (two) times daily.       . predniSONE (DELTASONE) 5 MG tablet Take 5 mg by mouth every morning.       . warfarin (COUMADIN) 5 MG tablet Take 1 tablet (5 mg total) by mouth every evening.  90 tablet  3   No current facility-administered  medications for this visit.    Past Medical History  Diagnosis Date  . Essential hypertension, benign   . Hyperlipidemia   . Type 2 diabetes mellitus   . 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 Hemorrhage 01/2010    Spontaneous hemorrhage of right thigh with possible over anticoagulation  . Paroxysmal atrial fibrillation   . GERD (gastroesophageal reflux disease)   . Arthritis   . Spigelian hernia   . Renal calculus, right   . Renal cyst, left   . Diverticulosis   . History of skin cancer   . CKD (chronic kidney disease) stage 3, GFR 30-59 ml/min     Social History Don Hall reports that he quit smoking about 55 years ago. His smoking use included Cigarettes. He has a 14 pack-year smoking history. He has never used smokeless tobacco. Don Hall reports that he does not drink alcohol.  Review of Systems No palpitations or syncope. Other systems reviewed and negative. Cardiac testing discussed above, overall reassuring. LVEF is normal with overall mild diastolic dysfunction and no major valvular abnormalities.  Physical Examination Filed Vitals:   11/13/13 0916  BP: 134/71  Pulse: 87   Filed Weights   11/13/13 0916  Weight: 225 lb (102.059 kg)   Overweight male, appears comfortable at rest.  HEENT: Conjunctiva and lids normal, oropharynx clear.  Neck:  Supple, no elevated JVP or carotid bruits, no thyromegaly.  Lungs: Clear to auscultation, nonlabored breathing at rest.  Cardiac: Regular rate and rhythm with ectopic beats, no S3, soft systolic murmur, no pericardial rub.  Abdomen: Soft, nontender, bowel sounds present, no guarding or rebound.  Extremities: 1+ leg edema and stasis, distal pulses 2+.    Problem List and Plan   Shortness of breath Cardiac testing as noted above overall reassuring. LVEF normal range with mild diastolic dysfunction and no major valvular abnormalities. Cardiolite  shows no large ischemic zones with probable soft tissue attenuation. No further cardiac testing planned now. He will continue see Dr. Quillian Hall, might consider PFTs to make sure he does not have any component of reactive airways disease. If his symptoms worsen, we can always discuss proceeding to invasive cardiac evaluation.  Paroxysmal atrial fibrillation He has been in sinus rhythm recently and with recent testing, no clear reason to think that this is causing his shortness of breath.  Essential hypertension, benign No changes made to current regimen.    Satira Sark, M.D., F.A.C.C.

## 2013-11-13 NOTE — Patient Instructions (Signed)

## 2013-11-13 NOTE — Assessment & Plan Note (Signed)
Cardiac testing as noted above overall reassuring. LVEF normal range with mild diastolic dysfunction and no major valvular abnormalities. Cardiolite shows no large ischemic zones with probable soft tissue attenuation. No further cardiac testing planned now. He will continue see Dr. Quillian Quince, might consider PFTs to make sure he does not have any component of reactive airways disease. If his symptoms worsen, we can always discuss proceeding to invasive cardiac evaluation.

## 2013-11-25 DIAGNOSIS — Z85828 Personal history of other malignant neoplasm of skin: Secondary | ICD-10-CM | POA: Diagnosis not present

## 2013-11-25 DIAGNOSIS — D485 Neoplasm of uncertain behavior of skin: Secondary | ICD-10-CM | POA: Diagnosis not present

## 2013-11-25 DIAGNOSIS — L570 Actinic keratosis: Secondary | ICD-10-CM | POA: Diagnosis not present

## 2013-11-26 DIAGNOSIS — I82409 Acute embolism and thrombosis of unspecified deep veins of unspecified lower extremity: Secondary | ICD-10-CM | POA: Diagnosis not present

## 2013-11-26 DIAGNOSIS — Z23 Encounter for immunization: Secondary | ICD-10-CM | POA: Diagnosis not present

## 2013-12-09 DIAGNOSIS — E1322 Other specified diabetes mellitus with diabetic chronic kidney disease: Secondary | ICD-10-CM | POA: Diagnosis not present

## 2013-12-09 DIAGNOSIS — N189 Chronic kidney disease, unspecified: Secondary | ICD-10-CM | POA: Diagnosis not present

## 2013-12-09 DIAGNOSIS — E13319 Other specified diabetes mellitus with unspecified diabetic retinopathy without macular edema: Secondary | ICD-10-CM | POA: Diagnosis not present

## 2013-12-09 DIAGNOSIS — E875 Hyperkalemia: Secondary | ICD-10-CM | POA: Diagnosis not present

## 2013-12-09 DIAGNOSIS — E1121 Type 2 diabetes mellitus with diabetic nephropathy: Secondary | ICD-10-CM | POA: Diagnosis not present

## 2013-12-09 DIAGNOSIS — I129 Hypertensive chronic kidney disease with stage 1 through stage 4 chronic kidney disease, or unspecified chronic kidney disease: Secondary | ICD-10-CM | POA: Diagnosis not present

## 2013-12-24 DIAGNOSIS — M1712 Unilateral primary osteoarthritis, left knee: Secondary | ICD-10-CM | POA: Diagnosis not present

## 2013-12-24 DIAGNOSIS — K210 Gastro-esophageal reflux disease with esophagitis: Secondary | ICD-10-CM | POA: Diagnosis not present

## 2013-12-24 DIAGNOSIS — S86812A Strain of other muscle(s) and tendon(s) at lower leg level, left leg, initial encounter: Secondary | ICD-10-CM | POA: Diagnosis not present

## 2013-12-25 DIAGNOSIS — R2242 Localized swelling, mass and lump, left lower limb: Secondary | ICD-10-CM | POA: Diagnosis not present

## 2013-12-25 DIAGNOSIS — M7989 Other specified soft tissue disorders: Secondary | ICD-10-CM | POA: Diagnosis not present

## 2013-12-26 DIAGNOSIS — Z471 Aftercare following joint replacement surgery: Secondary | ICD-10-CM | POA: Diagnosis not present

## 2013-12-26 DIAGNOSIS — I482 Chronic atrial fibrillation: Secondary | ICD-10-CM | POA: Diagnosis not present

## 2013-12-26 DIAGNOSIS — Z96653 Presence of artificial knee joint, bilateral: Secondary | ICD-10-CM | POA: Diagnosis not present

## 2013-12-30 DIAGNOSIS — Z96653 Presence of artificial knee joint, bilateral: Secondary | ICD-10-CM | POA: Diagnosis not present

## 2013-12-30 DIAGNOSIS — Z471 Aftercare following joint replacement surgery: Secondary | ICD-10-CM | POA: Diagnosis not present

## 2013-12-31 DIAGNOSIS — I482 Chronic atrial fibrillation: Secondary | ICD-10-CM | POA: Diagnosis not present

## 2014-01-17 DIAGNOSIS — E1342 Other specified diabetes mellitus with diabetic polyneuropathy: Secondary | ICD-10-CM | POA: Diagnosis not present

## 2014-01-17 DIAGNOSIS — L851 Acquired keratosis [keratoderma] palmaris et plantaris: Secondary | ICD-10-CM | POA: Diagnosis not present

## 2014-01-17 DIAGNOSIS — B351 Tinea unguium: Secondary | ICD-10-CM | POA: Diagnosis not present

## 2014-01-27 DIAGNOSIS — I10 Essential (primary) hypertension: Secondary | ICD-10-CM | POA: Diagnosis not present

## 2014-01-27 DIAGNOSIS — D649 Anemia, unspecified: Secondary | ICD-10-CM | POA: Diagnosis not present

## 2014-01-27 DIAGNOSIS — E782 Mixed hyperlipidemia: Secondary | ICD-10-CM | POA: Diagnosis not present

## 2014-01-27 DIAGNOSIS — I482 Chronic atrial fibrillation: Secondary | ICD-10-CM | POA: Diagnosis not present

## 2014-01-27 DIAGNOSIS — E1165 Type 2 diabetes mellitus with hyperglycemia: Secondary | ICD-10-CM | POA: Diagnosis not present

## 2014-01-28 DIAGNOSIS — I10 Essential (primary) hypertension: Secondary | ICD-10-CM | POA: Diagnosis not present

## 2014-01-28 DIAGNOSIS — E782 Mixed hyperlipidemia: Secondary | ICD-10-CM | POA: Diagnosis not present

## 2014-01-28 DIAGNOSIS — K210 Gastro-esophageal reflux disease with esophagitis: Secondary | ICD-10-CM | POA: Diagnosis not present

## 2014-01-28 DIAGNOSIS — E1142 Type 2 diabetes mellitus with diabetic polyneuropathy: Secondary | ICD-10-CM | POA: Diagnosis not present

## 2014-01-28 DIAGNOSIS — E6609 Other obesity due to excess calories: Secondary | ICD-10-CM | POA: Diagnosis not present

## 2014-01-28 DIAGNOSIS — Z1389 Encounter for screening for other disorder: Secondary | ICD-10-CM | POA: Diagnosis not present

## 2014-01-28 DIAGNOSIS — G6289 Other specified polyneuropathies: Secondary | ICD-10-CM | POA: Diagnosis not present

## 2014-01-28 DIAGNOSIS — I482 Chronic atrial fibrillation: Secondary | ICD-10-CM | POA: Diagnosis not present

## 2014-02-03 DIAGNOSIS — Z85828 Personal history of other malignant neoplasm of skin: Secondary | ICD-10-CM | POA: Diagnosis not present

## 2014-02-03 DIAGNOSIS — C44329 Squamous cell carcinoma of skin of other parts of face: Secondary | ICD-10-CM | POA: Diagnosis not present

## 2014-02-03 DIAGNOSIS — C44229 Squamous cell carcinoma of skin of left ear and external auricular canal: Secondary | ICD-10-CM | POA: Diagnosis not present

## 2014-02-03 DIAGNOSIS — D485 Neoplasm of uncertain behavior of skin: Secondary | ICD-10-CM | POA: Diagnosis not present

## 2014-02-03 DIAGNOSIS — L570 Actinic keratosis: Secondary | ICD-10-CM | POA: Diagnosis not present

## 2014-02-18 DIAGNOSIS — C4442 Squamous cell carcinoma of skin of scalp and neck: Secondary | ICD-10-CM | POA: Diagnosis not present

## 2014-02-18 DIAGNOSIS — C44320 Squamous cell carcinoma of skin of unspecified parts of face: Secondary | ICD-10-CM | POA: Diagnosis not present

## 2014-02-24 DIAGNOSIS — R279 Unspecified lack of coordination: Secondary | ICD-10-CM | POA: Diagnosis not present

## 2014-02-24 DIAGNOSIS — R262 Difficulty in walking, not elsewhere classified: Secondary | ICD-10-CM | POA: Diagnosis not present

## 2014-02-24 DIAGNOSIS — Z9181 History of falling: Secondary | ICD-10-CM | POA: Diagnosis not present

## 2014-02-24 DIAGNOSIS — M6281 Muscle weakness (generalized): Secondary | ICD-10-CM | POA: Diagnosis not present

## 2014-02-26 DIAGNOSIS — Z9181 History of falling: Secondary | ICD-10-CM | POA: Diagnosis not present

## 2014-02-26 DIAGNOSIS — R279 Unspecified lack of coordination: Secondary | ICD-10-CM | POA: Diagnosis not present

## 2014-02-26 DIAGNOSIS — I482 Chronic atrial fibrillation: Secondary | ICD-10-CM | POA: Diagnosis not present

## 2014-02-26 DIAGNOSIS — M6281 Muscle weakness (generalized): Secondary | ICD-10-CM | POA: Diagnosis not present

## 2014-02-26 DIAGNOSIS — R262 Difficulty in walking, not elsewhere classified: Secondary | ICD-10-CM | POA: Diagnosis not present

## 2014-02-27 DIAGNOSIS — R279 Unspecified lack of coordination: Secondary | ICD-10-CM | POA: Diagnosis not present

## 2014-02-27 DIAGNOSIS — Z9181 History of falling: Secondary | ICD-10-CM | POA: Diagnosis not present

## 2014-02-27 DIAGNOSIS — M6281 Muscle weakness (generalized): Secondary | ICD-10-CM | POA: Diagnosis not present

## 2014-02-27 DIAGNOSIS — R262 Difficulty in walking, not elsewhere classified: Secondary | ICD-10-CM | POA: Diagnosis not present

## 2014-03-03 DIAGNOSIS — R262 Difficulty in walking, not elsewhere classified: Secondary | ICD-10-CM | POA: Diagnosis not present

## 2014-03-03 DIAGNOSIS — M6281 Muscle weakness (generalized): Secondary | ICD-10-CM | POA: Diagnosis not present

## 2014-03-03 DIAGNOSIS — R279 Unspecified lack of coordination: Secondary | ICD-10-CM | POA: Diagnosis not present

## 2014-03-03 DIAGNOSIS — Z9181 History of falling: Secondary | ICD-10-CM | POA: Diagnosis not present

## 2014-03-06 DIAGNOSIS — Z9181 History of falling: Secondary | ICD-10-CM | POA: Diagnosis not present

## 2014-03-06 DIAGNOSIS — R262 Difficulty in walking, not elsewhere classified: Secondary | ICD-10-CM | POA: Diagnosis not present

## 2014-03-06 DIAGNOSIS — M6281 Muscle weakness (generalized): Secondary | ICD-10-CM | POA: Diagnosis not present

## 2014-03-06 DIAGNOSIS — R279 Unspecified lack of coordination: Secondary | ICD-10-CM | POA: Diagnosis not present

## 2014-03-10 DIAGNOSIS — R279 Unspecified lack of coordination: Secondary | ICD-10-CM | POA: Diagnosis not present

## 2014-03-10 DIAGNOSIS — Z9181 History of falling: Secondary | ICD-10-CM | POA: Diagnosis not present

## 2014-03-10 DIAGNOSIS — R262 Difficulty in walking, not elsewhere classified: Secondary | ICD-10-CM | POA: Diagnosis not present

## 2014-03-10 DIAGNOSIS — M6281 Muscle weakness (generalized): Secondary | ICD-10-CM | POA: Diagnosis not present

## 2014-03-11 DIAGNOSIS — M6281 Muscle weakness (generalized): Secondary | ICD-10-CM | POA: Diagnosis not present

## 2014-03-11 DIAGNOSIS — Z9181 History of falling: Secondary | ICD-10-CM | POA: Diagnosis not present

## 2014-03-11 DIAGNOSIS — R279 Unspecified lack of coordination: Secondary | ICD-10-CM | POA: Diagnosis not present

## 2014-03-11 DIAGNOSIS — R262 Difficulty in walking, not elsewhere classified: Secondary | ICD-10-CM | POA: Diagnosis not present

## 2014-03-12 DIAGNOSIS — S20212A Contusion of left front wall of thorax, initial encounter: Secondary | ICD-10-CM | POA: Diagnosis not present

## 2014-03-12 DIAGNOSIS — M7621 Iliac crest spur, right hip: Secondary | ICD-10-CM | POA: Diagnosis not present

## 2014-03-12 DIAGNOSIS — R0781 Pleurodynia: Secondary | ICD-10-CM | POA: Diagnosis not present

## 2014-03-12 DIAGNOSIS — R1031 Right lower quadrant pain: Secondary | ICD-10-CM | POA: Diagnosis not present

## 2014-03-13 ENCOUNTER — Ambulatory Visit
Admission: RE | Admit: 2014-03-13 | Discharge: 2014-03-13 | Disposition: A | Discharge status: Home / Self Care | Payer: Medicare Other | Source: Ambulatory Visit | Attending: Sports Medicine | Admitting: Sports Medicine

## 2014-03-13 ENCOUNTER — Other Ambulatory Visit: Payer: Self-pay | Admitting: Sports Medicine

## 2014-03-13 DIAGNOSIS — S2020XA Contusion of thorax, unspecified, initial encounter: Secondary | ICD-10-CM | POA: Diagnosis not present

## 2014-03-13 DIAGNOSIS — S20212A Contusion of left front wall of thorax, initial encounter: Secondary | ICD-10-CM

## 2014-03-13 DIAGNOSIS — R1031 Right lower quadrant pain: Secondary | ICD-10-CM

## 2014-03-13 DIAGNOSIS — M7620 Iliac crest spur, unspecified hip: Secondary | ICD-10-CM

## 2014-03-13 DIAGNOSIS — M7918 Myalgia, other site: Secondary | ICD-10-CM

## 2014-03-13 DIAGNOSIS — S301XXA Contusion of abdominal wall, initial encounter: Secondary | ICD-10-CM | POA: Diagnosis not present

## 2014-03-13 MED ORDER — IOHEXOL 300 MG/ML  SOLN
80.0000 mL | Freq: Once | INTRAMUSCULAR | Status: AC | PRN
  Administered 2014-03-13: 80 mL via INTRAVENOUS

## 2014-03-14 ENCOUNTER — Other Ambulatory Visit: Payer: Self-pay | Admitting: Sports Medicine

## 2014-03-14 DIAGNOSIS — S20212D Contusion of left front wall of thorax, subsequent encounter: Secondary | ICD-10-CM | POA: Diagnosis not present

## 2014-03-14 DIAGNOSIS — R1031 Right lower quadrant pain: Secondary | ICD-10-CM | POA: Diagnosis not present

## 2014-03-14 DIAGNOSIS — M7621 Iliac crest spur, right hip: Secondary | ICD-10-CM | POA: Diagnosis not present

## 2014-03-14 DIAGNOSIS — R0781 Pleurodynia: Secondary | ICD-10-CM | POA: Diagnosis not present

## 2014-03-20 DIAGNOSIS — I482 Chronic atrial fibrillation: Secondary | ICD-10-CM | POA: Diagnosis not present

## 2014-03-28 DIAGNOSIS — L851 Acquired keratosis [keratoderma] palmaris et plantaris: Secondary | ICD-10-CM | POA: Diagnosis not present

## 2014-03-28 DIAGNOSIS — B351 Tinea unguium: Secondary | ICD-10-CM | POA: Diagnosis not present

## 2014-03-28 DIAGNOSIS — E1342 Other specified diabetes mellitus with diabetic polyneuropathy: Secondary | ICD-10-CM | POA: Diagnosis not present

## 2014-03-31 DIAGNOSIS — Z9181 History of falling: Secondary | ICD-10-CM | POA: Diagnosis not present

## 2014-03-31 DIAGNOSIS — R279 Unspecified lack of coordination: Secondary | ICD-10-CM | POA: Diagnosis not present

## 2014-03-31 DIAGNOSIS — M6281 Muscle weakness (generalized): Secondary | ICD-10-CM | POA: Diagnosis not present

## 2014-03-31 DIAGNOSIS — R262 Difficulty in walking, not elsewhere classified: Secondary | ICD-10-CM | POA: Diagnosis not present

## 2014-04-02 DIAGNOSIS — M6281 Muscle weakness (generalized): Secondary | ICD-10-CM | POA: Diagnosis not present

## 2014-04-02 DIAGNOSIS — R279 Unspecified lack of coordination: Secondary | ICD-10-CM | POA: Diagnosis not present

## 2014-04-02 DIAGNOSIS — R262 Difficulty in walking, not elsewhere classified: Secondary | ICD-10-CM | POA: Diagnosis not present

## 2014-04-02 DIAGNOSIS — Z9181 History of falling: Secondary | ICD-10-CM | POA: Diagnosis not present

## 2014-04-03 DIAGNOSIS — M6281 Muscle weakness (generalized): Secondary | ICD-10-CM | POA: Diagnosis not present

## 2014-04-03 DIAGNOSIS — R262 Difficulty in walking, not elsewhere classified: Secondary | ICD-10-CM | POA: Diagnosis not present

## 2014-04-03 DIAGNOSIS — Z9181 History of falling: Secondary | ICD-10-CM | POA: Diagnosis not present

## 2014-04-03 DIAGNOSIS — R279 Unspecified lack of coordination: Secondary | ICD-10-CM | POA: Diagnosis not present

## 2014-04-07 DIAGNOSIS — R262 Difficulty in walking, not elsewhere classified: Secondary | ICD-10-CM | POA: Diagnosis not present

## 2014-04-07 DIAGNOSIS — R279 Unspecified lack of coordination: Secondary | ICD-10-CM | POA: Diagnosis not present

## 2014-04-07 DIAGNOSIS — Z9181 History of falling: Secondary | ICD-10-CM | POA: Diagnosis not present

## 2014-04-07 DIAGNOSIS — M6281 Muscle weakness (generalized): Secondary | ICD-10-CM | POA: Diagnosis not present

## 2014-04-09 DIAGNOSIS — Z9181 History of falling: Secondary | ICD-10-CM | POA: Diagnosis not present

## 2014-04-09 DIAGNOSIS — M6281 Muscle weakness (generalized): Secondary | ICD-10-CM | POA: Diagnosis not present

## 2014-04-09 DIAGNOSIS — R279 Unspecified lack of coordination: Secondary | ICD-10-CM | POA: Diagnosis not present

## 2014-04-09 DIAGNOSIS — R262 Difficulty in walking, not elsewhere classified: Secondary | ICD-10-CM | POA: Diagnosis not present

## 2014-04-17 DIAGNOSIS — I482 Chronic atrial fibrillation: Secondary | ICD-10-CM | POA: Diagnosis not present

## 2014-04-17 DIAGNOSIS — I8290 Acute embolism and thrombosis of unspecified vein: Secondary | ICD-10-CM | POA: Diagnosis not present

## 2014-04-23 DIAGNOSIS — K432 Incisional hernia without obstruction or gangrene: Secondary | ICD-10-CM | POA: Diagnosis not present

## 2014-05-05 DIAGNOSIS — L570 Actinic keratosis: Secondary | ICD-10-CM | POA: Diagnosis not present

## 2014-05-05 DIAGNOSIS — D485 Neoplasm of uncertain behavior of skin: Secondary | ICD-10-CM | POA: Diagnosis not present

## 2014-05-05 DIAGNOSIS — Z85828 Personal history of other malignant neoplasm of skin: Secondary | ICD-10-CM | POA: Diagnosis not present

## 2014-05-12 DIAGNOSIS — Z7901 Long term (current) use of anticoagulants: Secondary | ICD-10-CM | POA: Diagnosis not present

## 2014-05-12 DIAGNOSIS — M1990 Unspecified osteoarthritis, unspecified site: Secondary | ICD-10-CM | POA: Diagnosis not present

## 2014-05-12 DIAGNOSIS — K439 Ventral hernia without obstruction or gangrene: Secondary | ICD-10-CM | POA: Diagnosis not present

## 2014-05-12 DIAGNOSIS — Z86718 Personal history of other venous thrombosis and embolism: Secondary | ICD-10-CM | POA: Diagnosis not present

## 2014-05-12 DIAGNOSIS — D649 Anemia, unspecified: Secondary | ICD-10-CM | POA: Diagnosis not present

## 2014-05-12 DIAGNOSIS — E118 Type 2 diabetes mellitus with unspecified complications: Secondary | ICD-10-CM | POA: Diagnosis not present

## 2014-05-12 DIAGNOSIS — I499 Cardiac arrhythmia, unspecified: Secondary | ICD-10-CM | POA: Diagnosis not present

## 2014-05-12 DIAGNOSIS — Z01810 Encounter for preprocedural cardiovascular examination: Secondary | ICD-10-CM | POA: Diagnosis not present

## 2014-05-12 DIAGNOSIS — Z01818 Encounter for other preprocedural examination: Secondary | ICD-10-CM | POA: Diagnosis not present

## 2014-05-12 DIAGNOSIS — D62 Acute posthemorrhagic anemia: Secondary | ICD-10-CM | POA: Diagnosis not present

## 2014-05-12 DIAGNOSIS — E119 Type 2 diabetes mellitus without complications: Secondary | ICD-10-CM | POA: Diagnosis not present

## 2014-05-16 DIAGNOSIS — I482 Chronic atrial fibrillation: Secondary | ICD-10-CM | POA: Diagnosis not present

## 2014-05-21 DIAGNOSIS — I10 Essential (primary) hypertension: Secondary | ICD-10-CM | POA: Diagnosis not present

## 2014-05-21 DIAGNOSIS — N183 Chronic kidney disease, stage 3 (moderate): Secondary | ICD-10-CM | POA: Diagnosis not present

## 2014-05-21 DIAGNOSIS — E6609 Other obesity due to excess calories: Secondary | ICD-10-CM | POA: Diagnosis not present

## 2014-05-21 DIAGNOSIS — M1990 Unspecified osteoarthritis, unspecified site: Secondary | ICD-10-CM | POA: Diagnosis not present

## 2014-05-21 DIAGNOSIS — K210 Gastro-esophageal reflux disease with esophagitis: Secondary | ICD-10-CM | POA: Diagnosis not present

## 2014-05-21 DIAGNOSIS — I482 Chronic atrial fibrillation: Secondary | ICD-10-CM | POA: Diagnosis not present

## 2014-05-21 DIAGNOSIS — E782 Mixed hyperlipidemia: Secondary | ICD-10-CM | POA: Diagnosis not present

## 2014-05-21 DIAGNOSIS — E1122 Type 2 diabetes mellitus with diabetic chronic kidney disease: Secondary | ICD-10-CM | POA: Diagnosis not present

## 2014-05-22 DIAGNOSIS — K432 Incisional hernia without obstruction or gangrene: Secondary | ICD-10-CM | POA: Diagnosis not present

## 2014-05-22 DIAGNOSIS — N183 Chronic kidney disease, stage 3 (moderate): Secondary | ICD-10-CM | POA: Diagnosis not present

## 2014-05-22 DIAGNOSIS — Z7901 Long term (current) use of anticoagulants: Secondary | ICD-10-CM | POA: Diagnosis not present

## 2014-05-22 DIAGNOSIS — Z9889 Other specified postprocedural states: Secondary | ICD-10-CM | POA: Diagnosis not present

## 2014-05-22 DIAGNOSIS — K219 Gastro-esophageal reflux disease without esophagitis: Secondary | ICD-10-CM | POA: Diagnosis not present

## 2014-05-22 DIAGNOSIS — E782 Mixed hyperlipidemia: Secondary | ICD-10-CM | POA: Diagnosis not present

## 2014-05-22 DIAGNOSIS — I129 Hypertensive chronic kidney disease with stage 1 through stage 4 chronic kidney disease, or unspecified chronic kidney disease: Secondary | ICD-10-CM | POA: Diagnosis not present

## 2014-05-22 DIAGNOSIS — Z86718 Personal history of other venous thrombosis and embolism: Secondary | ICD-10-CM | POA: Diagnosis not present

## 2014-05-22 DIAGNOSIS — E119 Type 2 diabetes mellitus without complications: Secondary | ICD-10-CM | POA: Diagnosis not present

## 2014-05-26 DIAGNOSIS — K219 Gastro-esophageal reflux disease without esophagitis: Secondary | ICD-10-CM | POA: Diagnosis not present

## 2014-05-26 DIAGNOSIS — I482 Chronic atrial fibrillation: Secondary | ICD-10-CM | POA: Diagnosis not present

## 2014-05-26 DIAGNOSIS — I10 Essential (primary) hypertension: Secondary | ICD-10-CM | POA: Diagnosis not present

## 2014-05-26 DIAGNOSIS — M353 Polymyalgia rheumatica: Secondary | ICD-10-CM | POA: Diagnosis not present

## 2014-05-26 DIAGNOSIS — E1142 Type 2 diabetes mellitus with diabetic polyneuropathy: Secondary | ICD-10-CM | POA: Diagnosis not present

## 2014-05-26 DIAGNOSIS — E1122 Type 2 diabetes mellitus with diabetic chronic kidney disease: Secondary | ICD-10-CM | POA: Diagnosis not present

## 2014-05-26 DIAGNOSIS — G6289 Other specified polyneuropathies: Secondary | ICD-10-CM | POA: Diagnosis not present

## 2014-05-26 DIAGNOSIS — D649 Anemia, unspecified: Secondary | ICD-10-CM | POA: Diagnosis not present

## 2014-05-26 DIAGNOSIS — E6609 Other obesity due to excess calories: Secondary | ICD-10-CM | POA: Diagnosis not present

## 2014-05-26 DIAGNOSIS — N183 Chronic kidney disease, stage 3 (moderate): Secondary | ICD-10-CM | POA: Diagnosis not present

## 2014-05-26 DIAGNOSIS — E782 Mixed hyperlipidemia: Secondary | ICD-10-CM | POA: Diagnosis not present

## 2014-06-03 DIAGNOSIS — I482 Chronic atrial fibrillation: Secondary | ICD-10-CM | POA: Diagnosis not present

## 2014-06-06 DIAGNOSIS — L851 Acquired keratosis [keratoderma] palmaris et plantaris: Secondary | ICD-10-CM | POA: Diagnosis not present

## 2014-06-06 DIAGNOSIS — B351 Tinea unguium: Secondary | ICD-10-CM | POA: Diagnosis not present

## 2014-06-06 DIAGNOSIS — E1342 Other specified diabetes mellitus with diabetic polyneuropathy: Secondary | ICD-10-CM | POA: Diagnosis not present

## 2014-06-11 DIAGNOSIS — R1013 Epigastric pain: Secondary | ICD-10-CM | POA: Diagnosis not present

## 2014-06-11 DIAGNOSIS — R600 Localized edema: Secondary | ICD-10-CM | POA: Diagnosis not present

## 2014-06-11 DIAGNOSIS — Z86718 Personal history of other venous thrombosis and embolism: Secondary | ICD-10-CM | POA: Diagnosis not present

## 2014-06-11 DIAGNOSIS — R2240 Localized swelling, mass and lump, unspecified lower limb: Secondary | ICD-10-CM | POA: Diagnosis not present

## 2014-06-11 DIAGNOSIS — Z9889 Other specified postprocedural states: Secondary | ICD-10-CM | POA: Diagnosis not present

## 2014-06-16 ENCOUNTER — Encounter: Payer: Self-pay | Admitting: Nurse Practitioner

## 2014-06-16 ENCOUNTER — Ambulatory Visit (INDEPENDENT_AMBULATORY_CARE_PROVIDER_SITE_OTHER): Payer: Medicare Other | Admitting: Nurse Practitioner

## 2014-06-16 ENCOUNTER — Encounter: Payer: Self-pay | Admitting: Cardiology

## 2014-06-16 ENCOUNTER — Ambulatory Visit (INDEPENDENT_AMBULATORY_CARE_PROVIDER_SITE_OTHER): Payer: Medicare Other | Admitting: Cardiology

## 2014-06-16 VITALS — BP 147/64 | HR 75 | Temp 98.1°F | Ht 70.0 in | Wt 215.8 lb

## 2014-06-16 VITALS — BP 142/68 | HR 78 | Ht 70.0 in | Wt 215.1 lb

## 2014-06-16 DIAGNOSIS — I480 Paroxysmal atrial fibrillation: Secondary | ICD-10-CM

## 2014-06-16 DIAGNOSIS — I10 Essential (primary) hypertension: Secondary | ICD-10-CM | POA: Diagnosis not present

## 2014-06-16 DIAGNOSIS — K219 Gastro-esophageal reflux disease without esophagitis: Secondary | ICD-10-CM

## 2014-06-16 DIAGNOSIS — R197 Diarrhea, unspecified: Secondary | ICD-10-CM | POA: Diagnosis not present

## 2014-06-16 MED ORDER — PANTOPRAZOLE SODIUM 40 MG PO TBEC: 40.0000 mg | DELAYED_RELEASE_TABLET | Freq: Every day | ORAL | Status: DC
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
## 2014-06-16 NOTE — Patient Instructions (Signed)
Your physician wants you to follow-up in: 6 months with Dr. McDowell You will receive a reminder letter in the mail two months in advance. If you don't receive a letter, please call our office to schedule the follow-up appointment.  Your physician recommends that you continue on your current medications as directed. Please refer to the Current Medication list given to you today.  Thank you for choosing [facility]!!    

## 2014-06-16 NOTE — Patient Instructions (Signed)
1. Collect stool sample in a couple provide him return him to the lab. 2. Stop taking her Prilosec, start taking Protonix 40 mg once a day. I've printed prescription for you to take to the [place]. 3. Start a daily probiotic. 4. Return for further evaluation in 8 weeks.

## 2014-06-16 NOTE — Progress Notes (Signed)
Cardiology Office Note  Date: 06/16/2014   ID: [facility], DOB [birth year], MRN [id number]  PCP: Don Ponto, MD  Primary Cardiologist: Don Lesches, MD   Chief Complaint  Patient presents with  . PAF  . Hypertension    History of Present Illness: [facility] is an 81 y.o. male last seen in September 2015. He presents for a routine follow-up visit. States that he just recently had left sided hernia surgery at [facility], did well. He does not endorse any angina symptoms or increasing shortness of breath with typical activities.  We reviewed his medications, there have been no significant changes overall. He did bring in lab work done by [facility] back in late March, outlined below.   Past Medical History  Diagnosis Date  . Essential hypertension, benign   . Hyperlipidemia   . Type 2 diabetes mellitus   . 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 Hemorrhage 01/2010    Spontaneous hemorrhage of right thigh with possible over anticoagulation  . Paroxysmal atrial fibrillation   . GERD (gastroesophageal reflux disease)   . Arthritis   . Spigelian hernia   . Renal calculus, right   . Renal cyst, left   . Diverticulosis   . History of skin cancer   . CKD (chronic kidney disease) stage 3, GFR 30-59 ml/min     Current Outpatient Prescriptions  Medication Sig Dispense Refill  . atorvastatin (LIPITOR) 10 MG tablet Take 10 mg by mouth daily.    . carboxymethylcellulose (REFRESH) 1 % ophthalmic solution Apply 1 drop to eye daily as needed. DRY EYES     . glipiZIDE (GLUCOTROL) 10 MG tablet Take 5 mg by mouth 2 (two) times daily.     . Horse Chestnut (VENASTAT) 300 MG CPCR Take 1 capsule by mouth daily.     Marland Kitchen lisinopril-hydrochlorothiazide (PRINZIDE,ZESTORETIC) 20-12.5 MG per tablet Take 1.5 tablets by mouth every morning.     . Multiple Vitamins-Minerals (PRESERVISION AREDS 2 PO) Take 1 tablet by  mouth 2 (two) times daily.     . pantoprazole (PROTONIX) 40 MG tablet Take 1 tablet (40 mg total) by mouth daily. 90 tablet 3  . warfarin (COUMADIN) 5 MG tablet Take 1 tablet (5 mg total) by mouth every evening. (Patient taking differently: Take 5 mg by mouth every evening. MANAGED BY PMD) 90 tablet 3   No current facility-administered medications for this visit.    Allergies:  Review of patient's allergies indicates no known allergies.   Social History: The patient  reports that he quit smoking about 56 years ago. His smoking use included Cigarettes. He has a 14 pack-year smoking history. He has never used smokeless tobacco. He reports that he does not drink alcohol or use illicit drugs.    ROS:  Please see the history of present illness. Otherwise, complete review of systems is positive for mild arthritic pains.  All other systems are reviewed and negative.   Physical Exam: VS:  BP 142/68 mmHg  Pulse 78  Ht 5\' 10"  (1.778 m)  Wt 215 lb 1.9 oz (97.578 kg)  BMI 30.87 kg/m2  SpO2 96%, BMI Body mass index is 30.87 kg/(m^2).  Wt Readings from Last 3 Encounters:  06/16/14 215 lb 1.9 oz (97.578 kg)  06/16/14 215 lb 12.8 oz (97.886 kg)  11/13/13 225 lb (102.059 kg)     Overweight male, appears comfortable at rest.  HEENT: Conjunctiva and lids normal, oropharynx clear.  Neck: Supple, no elevated JVP or carotid bruits, no thyromegaly.  Lungs: Clear to auscultation, nonlabored breathing at rest.  Cardiac: Regular rate and rhythm with ectopic beats, no S3, soft systolic murmur, no pericardial rub.  Abdomen: Soft, nontender, bowel sounds present, no guarding or rebound.  Extremities: 1+ leg edema and stasis, distal pulses 2+.    ECG: ECG is not ordered today.   Recent Labwork:  05/21/2014: Hemoglobin 12.3, platelets 218, BUN 727, creatinine 1.6, potassium 4.4, AST 15, ALT 14, cholesterol 116, triglycerides 85, HDL 41, LDL 58, hemoglobin A1c 7.1, TSH 1.4.  Other Studies Reviewed  Today:  1. Echocardiogram from August 2015 reported mild LVH with LVEF 123456, grade 1 diastolic dysfunction, mild mitral regurgitation, trivial aortic regurgitation, moderate RV dilatation with normal contraction and prominent trabeculation toward the apex, mild tricuspid regurgitation with PASP 42 mm mercury.   2. [facility] also done in August 2015 showed no diagnostic ST segment changes with rare PACs and PVCs, inferolateral soft tissue attenuation without clear evidence of scar or ischemia, LVEF 67%.  Assessment and Plan:  1. Paroxysmal atrial fibrillation, continues on Coumadin with follow-up per [facility]. Not significantly symptomatic.  2. Essential hypertension, blood pressure reasonable today.  3. Low risk ischemic workup from August 2015 with normal LVEF.  Current medicines were reviewed with the patient today.  Disposition: FU with me in 6 months.   Signed, Don Sark, MD, [facility] 06/16/2014 12:02 PM    [facility] [place], [place], [place] [address] Phone: [phone]; Fax: [phone]

## 2014-06-20 NOTE — Assessment & Plan Note (Signed)
Patient with complaints of diarrhea since having hernia surgery. Was on antibiotics. Today we will collect stool studies to test for GI pathogens including C. difficile. We'll advise start a probiotic as well. Follow-up in 8 weeks to reassess symptoms and plan for further evaluation and treatment is needed.

## 2014-06-20 NOTE — Assessment & Plan Note (Addendum)
Patient with continued GERD symptoms currently on Prilosec. Change Prilosec to Protonix 40 mg once daily. Return for follow-up in 8 weeks. No red flag/warning signs or symptoms noted. If continued symptoms of follow-up despite change in PPI, consider further evaluation including possible endoscopy.

## 2014-06-23 DIAGNOSIS — I482 Chronic atrial fibrillation: Secondary | ICD-10-CM | POA: Diagnosis not present

## 2014-06-23 NOTE — Progress Notes (Signed)
cc'ed to pcp °

## 2014-06-24 DIAGNOSIS — R197 Diarrhea, unspecified: Secondary | ICD-10-CM | POA: Diagnosis not present

## 2014-06-25 LAB — CLOSTRIDIUM DIFFICILE BY PCR: CDIFFPCR: NOT DETECTED

## 2014-06-25 LAB — GIARDIA ANTIGEN: Giardia Screen (EIA): NEGATIVE

## 2014-06-28 LAB — STOOL CULTURE

## 2014-07-02 DIAGNOSIS — Z7901 Long term (current) use of anticoagulants: Secondary | ICD-10-CM | POA: Diagnosis not present

## 2014-07-02 DIAGNOSIS — R110 Nausea: Secondary | ICD-10-CM | POA: Diagnosis not present

## 2014-07-02 DIAGNOSIS — R600 Localized edema: Secondary | ICD-10-CM | POA: Diagnosis not present

## 2014-07-02 DIAGNOSIS — T148 Other injury of unspecified body region: Secondary | ICD-10-CM | POA: Diagnosis not present

## 2014-07-02 DIAGNOSIS — Z4889 Encounter for other specified surgical aftercare: Secondary | ICD-10-CM | POA: Diagnosis not present

## 2014-07-02 DIAGNOSIS — R1013 Epigastric pain: Secondary | ICD-10-CM | POA: Diagnosis not present

## 2014-07-22 DIAGNOSIS — I482 Chronic atrial fibrillation: Secondary | ICD-10-CM | POA: Diagnosis not present

## 2014-07-24 DIAGNOSIS — M75102 Unspecified rotator cuff tear or rupture of left shoulder, not specified as traumatic: Secondary | ICD-10-CM | POA: Diagnosis not present

## 2014-07-29 DIAGNOSIS — I482 Chronic atrial fibrillation: Secondary | ICD-10-CM | POA: Diagnosis not present

## 2014-08-12 DIAGNOSIS — C44329 Squamous cell carcinoma of skin of other parts of face: Secondary | ICD-10-CM | POA: Diagnosis not present

## 2014-08-12 DIAGNOSIS — D485 Neoplasm of uncertain behavior of skin: Secondary | ICD-10-CM | POA: Diagnosis not present

## 2014-08-18 ENCOUNTER — Encounter: Payer: Self-pay | Admitting: Nurse Practitioner

## 2014-08-18 ENCOUNTER — Ambulatory Visit (INDEPENDENT_AMBULATORY_CARE_PROVIDER_SITE_OTHER): Payer: Medicare Other | Admitting: Nurse Practitioner

## 2014-08-18 VITALS — BP 132/59 | HR 73 | Temp 97.0°F | Ht 70.0 in | Wt 214.0 lb

## 2014-08-18 DIAGNOSIS — L570 Actinic keratosis: Secondary | ICD-10-CM | POA: Diagnosis not present

## 2014-08-18 DIAGNOSIS — C44320 Squamous cell carcinoma of skin of unspecified parts of face: Secondary | ICD-10-CM | POA: Diagnosis not present

## 2014-08-18 DIAGNOSIS — R197 Diarrhea, unspecified: Secondary | ICD-10-CM

## 2014-08-18 DIAGNOSIS — K219 Gastro-esophageal reflux disease without esophagitis: Secondary | ICD-10-CM | POA: Diagnosis not present

## 2014-08-18 NOTE — Progress Notes (Signed)
cc'd to pcp 

## 2014-08-18 NOTE — Assessment & Plan Note (Signed)
Symptoms have resolved. Continue Protonix and return for follow-up as needed.

## 2014-08-18 NOTE — Patient Instructions (Signed)
1. Continue taking your Protonix 2. Call if you have any worsening or return of symptoms and we can work your in for an appointment.
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
## 2014-08-18 NOTE — Assessment & Plan Note (Addendum)
Symptoms have completely resolved. Continue Protonix. Return for further evaluation as needed for return of symptoms or worsening symptoms. Colonoscopy up today and not due for repeat screening until 2018.

## 2014-08-19 DIAGNOSIS — I482 Chronic atrial fibrillation: Secondary | ICD-10-CM | POA: Diagnosis not present

## 2014-08-29 DIAGNOSIS — E1342 Other specified diabetes mellitus with diabetic polyneuropathy: Secondary | ICD-10-CM | POA: Diagnosis not present

## 2014-08-29 DIAGNOSIS — L851 Acquired keratosis [keratoderma] palmaris et plantaris: Secondary | ICD-10-CM | POA: Diagnosis not present

## 2014-08-29 DIAGNOSIS — B351 Tinea unguium: Secondary | ICD-10-CM | POA: Diagnosis not present

## 2014-09-03 DIAGNOSIS — I482 Chronic atrial fibrillation: Secondary | ICD-10-CM | POA: Diagnosis not present

## 2014-09-15 DIAGNOSIS — Z833 Family history of diabetes mellitus: Secondary | ICD-10-CM | POA: Diagnosis not present

## 2014-09-15 DIAGNOSIS — Z7901 Long term (current) use of anticoagulants: Secondary | ICD-10-CM | POA: Diagnosis not present

## 2014-09-15 DIAGNOSIS — K219 Gastro-esophageal reflux disease without esophagitis: Secondary | ICD-10-CM | POA: Diagnosis not present

## 2014-09-15 DIAGNOSIS — E780 Pure hypercholesterolemia: Secondary | ICD-10-CM | POA: Diagnosis not present

## 2014-09-15 DIAGNOSIS — L97929 Non-pressure chronic ulcer of unspecified part of left lower leg with unspecified severity: Secondary | ICD-10-CM | POA: Diagnosis not present

## 2014-09-15 DIAGNOSIS — I10 Essential (primary) hypertension: Secondary | ICD-10-CM | POA: Diagnosis not present

## 2014-09-15 DIAGNOSIS — S81802A Unspecified open wound, left lower leg, initial encounter: Secondary | ICD-10-CM | POA: Diagnosis not present

## 2014-09-15 DIAGNOSIS — E119 Type 2 diabetes mellitus without complications: Secondary | ICD-10-CM | POA: Diagnosis not present

## 2014-09-15 DIAGNOSIS — Z86718 Personal history of other venous thrombosis and embolism: Secondary | ICD-10-CM | POA: Diagnosis not present

## 2014-09-15 DIAGNOSIS — Z8249 Family history of ischemic heart disease and other diseases of the circulatory system: Secondary | ICD-10-CM | POA: Diagnosis not present

## 2014-09-15 DIAGNOSIS — Z87891 Personal history of nicotine dependence: Secondary | ICD-10-CM | POA: Diagnosis not present

## 2014-09-15 DIAGNOSIS — Z79899 Other long term (current) drug therapy: Secondary | ICD-10-CM | POA: Diagnosis not present

## 2014-09-15 DIAGNOSIS — L03116 Cellulitis of left lower limb: Secondary | ICD-10-CM | POA: Diagnosis not present

## 2014-09-17 DIAGNOSIS — L03116 Cellulitis of left lower limb: Secondary | ICD-10-CM | POA: Diagnosis not present

## 2014-09-22 DIAGNOSIS — M1990 Unspecified osteoarthritis, unspecified site: Secondary | ICD-10-CM | POA: Diagnosis not present

## 2014-09-22 DIAGNOSIS — E1122 Type 2 diabetes mellitus with diabetic chronic kidney disease: Secondary | ICD-10-CM | POA: Diagnosis not present

## 2014-09-22 DIAGNOSIS — K210 Gastro-esophageal reflux disease with esophagitis: Secondary | ICD-10-CM | POA: Diagnosis not present

## 2014-09-22 DIAGNOSIS — E782 Mixed hyperlipidemia: Secondary | ICD-10-CM | POA: Diagnosis not present

## 2014-09-22 DIAGNOSIS — I10 Essential (primary) hypertension: Secondary | ICD-10-CM | POA: Diagnosis not present

## 2014-09-29 DIAGNOSIS — I10 Essential (primary) hypertension: Secondary | ICD-10-CM | POA: Diagnosis not present

## 2014-09-29 DIAGNOSIS — M353 Polymyalgia rheumatica: Secondary | ICD-10-CM | POA: Diagnosis not present

## 2014-09-29 DIAGNOSIS — E1122 Type 2 diabetes mellitus with diabetic chronic kidney disease: Secondary | ICD-10-CM | POA: Diagnosis not present

## 2014-09-29 DIAGNOSIS — E6609 Other obesity due to excess calories: Secondary | ICD-10-CM | POA: Diagnosis not present

## 2014-09-29 DIAGNOSIS — I482 Chronic atrial fibrillation: Secondary | ICD-10-CM | POA: Diagnosis not present

## 2014-09-29 DIAGNOSIS — G6289 Other specified polyneuropathies: Secondary | ICD-10-CM | POA: Diagnosis not present

## 2014-09-29 DIAGNOSIS — K219 Gastro-esophageal reflux disease without esophagitis: Secondary | ICD-10-CM | POA: Diagnosis not present

## 2014-09-29 DIAGNOSIS — E1142 Type 2 diabetes mellitus with diabetic polyneuropathy: Secondary | ICD-10-CM | POA: Diagnosis not present

## 2014-09-29 DIAGNOSIS — E782 Mixed hyperlipidemia: Secondary | ICD-10-CM | POA: Diagnosis not present

## 2014-09-30 DIAGNOSIS — Z79899 Other long term (current) drug therapy: Secondary | ICD-10-CM | POA: Diagnosis not present

## 2014-09-30 DIAGNOSIS — I10 Essential (primary) hypertension: Secondary | ICD-10-CM | POA: Diagnosis not present

## 2014-09-30 DIAGNOSIS — L97829 Non-pressure chronic ulcer of other part of left lower leg with unspecified severity: Secondary | ICD-10-CM | POA: Diagnosis not present

## 2014-09-30 DIAGNOSIS — E119 Type 2 diabetes mellitus without complications: Secondary | ICD-10-CM | POA: Diagnosis not present

## 2014-09-30 DIAGNOSIS — I4891 Unspecified atrial fibrillation: Secondary | ICD-10-CM | POA: Diagnosis not present

## 2014-09-30 DIAGNOSIS — L97822 Non-pressure chronic ulcer of other part of left lower leg with fat layer exposed: Secondary | ICD-10-CM | POA: Diagnosis not present

## 2014-09-30 DIAGNOSIS — I83028 Varicose veins of left lower extremity with ulcer other part of lower leg: Secondary | ICD-10-CM | POA: Diagnosis not present

## 2014-09-30 DIAGNOSIS — Z7901 Long term (current) use of anticoagulants: Secondary | ICD-10-CM | POA: Diagnosis not present

## 2014-09-30 DIAGNOSIS — Z8249 Family history of ischemic heart disease and other diseases of the circulatory system: Secondary | ICD-10-CM | POA: Diagnosis not present

## 2014-10-03 DIAGNOSIS — I482 Chronic atrial fibrillation: Secondary | ICD-10-CM | POA: Diagnosis not present

## 2014-10-07 DIAGNOSIS — I10 Essential (primary) hypertension: Secondary | ICD-10-CM | POA: Diagnosis not present

## 2014-10-07 DIAGNOSIS — I4891 Unspecified atrial fibrillation: Secondary | ICD-10-CM | POA: Diagnosis not present

## 2014-10-07 DIAGNOSIS — I83028 Varicose veins of left lower extremity with ulcer other part of lower leg: Secondary | ICD-10-CM | POA: Diagnosis not present

## 2014-10-07 DIAGNOSIS — Z79899 Other long term (current) drug therapy: Secondary | ICD-10-CM | POA: Diagnosis not present

## 2014-10-07 DIAGNOSIS — L97822 Non-pressure chronic ulcer of other part of left lower leg with fat layer exposed: Secondary | ICD-10-CM | POA: Diagnosis not present

## 2014-10-07 DIAGNOSIS — E119 Type 2 diabetes mellitus without complications: Secondary | ICD-10-CM | POA: Diagnosis not present

## 2014-10-07 DIAGNOSIS — L97829 Non-pressure chronic ulcer of other part of left lower leg with unspecified severity: Secondary | ICD-10-CM | POA: Diagnosis not present

## 2014-10-10 ENCOUNTER — Encounter (HOSPITAL_COMMUNITY): Payer: Self-pay

## 2014-10-10 ENCOUNTER — Emergency Department (HOSPITAL_COMMUNITY)
Admission: EM | Admit: 2014-10-10 | Discharge: 2014-10-10 | Disposition: A | Discharge status: Home / Self Care | Payer: Medicare Other | Attending: Emergency Medicine | Admitting: Emergency Medicine

## 2014-10-10 DIAGNOSIS — I480 Paroxysmal atrial fibrillation: Secondary | ICD-10-CM | POA: Diagnosis not present

## 2014-10-10 DIAGNOSIS — N183 Chronic kidney disease, stage 3 (moderate): Secondary | ICD-10-CM | POA: Insufficient documentation

## 2014-10-10 DIAGNOSIS — Z87891 Personal history of nicotine dependence: Secondary | ICD-10-CM | POA: Diagnosis not present

## 2014-10-10 DIAGNOSIS — K219 Gastro-esophageal reflux disease without esophagitis: Secondary | ICD-10-CM | POA: Diagnosis not present

## 2014-10-10 DIAGNOSIS — Z79899 Other long term (current) drug therapy: Secondary | ICD-10-CM | POA: Diagnosis not present

## 2014-10-10 DIAGNOSIS — Z85828 Personal history of other malignant neoplasm of skin: Secondary | ICD-10-CM | POA: Insufficient documentation

## 2014-10-10 DIAGNOSIS — L97929 Non-pressure chronic ulcer of unspecified part of left lower leg with unspecified severity: Secondary | ICD-10-CM | POA: Insufficient documentation

## 2014-10-10 DIAGNOSIS — Z87442 Personal history of urinary calculi: Secondary | ICD-10-CM | POA: Diagnosis not present

## 2014-10-10 DIAGNOSIS — Q6100 Congenital renal cyst, unspecified: Secondary | ICD-10-CM | POA: Diagnosis not present

## 2014-10-10 DIAGNOSIS — I129 Hypertensive chronic kidney disease with stage 1 through stage 4 chronic kidney disease, or unspecified chronic kidney disease: Secondary | ICD-10-CM | POA: Diagnosis not present

## 2014-10-10 DIAGNOSIS — Z86718 Personal history of other venous thrombosis and embolism: Secondary | ICD-10-CM | POA: Insufficient documentation

## 2014-10-10 DIAGNOSIS — Z7901 Long term (current) use of anticoagulants: Secondary | ICD-10-CM | POA: Insufficient documentation

## 2014-10-10 DIAGNOSIS — E119 Type 2 diabetes mellitus without complications: Secondary | ICD-10-CM | POA: Diagnosis not present

## 2014-10-10 DIAGNOSIS — M1990 Unspecified osteoarthritis, unspecified site: Secondary | ICD-10-CM | POA: Insufficient documentation

## 2014-10-10 DIAGNOSIS — E785 Hyperlipidemia, unspecified: Secondary | ICD-10-CM | POA: Diagnosis not present

## 2014-10-10 MED ORDER — CLINDAMYCIN HCL 150 MG PO CAPS: 150.0000 mg | ORAL_CAPSULE | Freq: Four times a day (QID) | ORAL | Status: DC

## 2014-10-10 NOTE — ED Notes (Signed)
Patient states he developed a skin ulcer on the left out lower leg 6 weeks ago. patient was given Keflex at that time and states he took all that was prescribed which was a month ago. Patient having yellow-brown drainage, increased swlling and increased pain in the left lower leg.-+

## 2014-10-10 NOTE — ED Notes (Signed)
Wrapped pt leg with pad and tape loosely. Pt complained it was wrapped too tightly from wound center.
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
## 2014-10-10 NOTE — Discharge Instructions (Signed)
Skin Ulcer  A skin ulcer is an open sore that can be shallow or deep. Skin ulcers sometimes become infected and are difficult to treat. It may be 1 month or longer before real healing progress is made.  CAUSES    Injury.   Problems with the veins or arteries.   Diabetes.   Insect bites.   Bedsores.   Inflammatory conditions.  SYMPTOMS    Pain, redness, swelling, and tenderness around the ulcer.   Fever.   Bleeding from the ulcer.   Yellow or clear fluid coming from the ulcer.  DIAGNOSIS   There are many types of skin ulcers. Any open sores will be examined. Certain tests will be done to determine the kind of ulcer you have. The right treatment depends on the type of ulcer you have.  TREATMENT   Treatment is a long-term challenge. It may include:   Wearing an elastic wrap, compression stockings, or gel cast over the ulcer area.   Taking antibiotic medicines or putting antibiotic creams on the affected area if there is an infection.  HOME CARE INSTRUCTIONS   Put on your bandages (dressings), wraps, or casts over the ulcer as directed by your caregiver.   Change all dressings as directed by your caregiver.   Take all medicines as directed by your caregiver.   Keep the affected area clean and dry.   Avoid injuries to the affected area.   Eat a well-balanced, healthy diet that includes plenty of fruit and vegetables.   If you smoke, consider quitting or decreasing the amount of cigarettes you smoke.   Once the ulcer heals, get regular exercise as directed by your caregiver.   Work with your caregiver to make sure your blood pressure, cholesterol, and diabetes are well-controlled.   Keep your skin moisturized. Dry skin can crack and lead to skin ulcers.  SEEK IMMEDIATE MEDICAL CARE IF:    Your pain gets worse.   You have swelling, redness, or fluids around the ulcer.   You have chills.   You have a fever.  MAKE SURE YOU:    Understand these instructions.   Will watch your condition.   Will get  help right away if you are not doing well or get worse.  Document Released: 03/24/2004 Document Revised: 05/09/2011 Document Reviewed: 10/01/2010  Exit[employer] Patient Information 2015 [employer]. This information is not intended to replace advice given to you by your health care provider. Make sure you discuss any questions you have with your health care provider.

## 2014-10-10 NOTE — ED Notes (Signed)
Pt is diabetic.  He has an ulcer to the lateral aspect of his left lower extremity.  It is painful (stinging) and is draining yellow discharge. The ulcer occurred six weeks ago.  He finished a round of antibiotics (Cephalexin 250 mg 1 capsule 4 times daily).  He finished this medication about two weeks ago.

## 2014-10-14 DIAGNOSIS — E119 Type 2 diabetes mellitus without complications: Secondary | ICD-10-CM | POA: Diagnosis not present

## 2014-10-14 DIAGNOSIS — L97822 Non-pressure chronic ulcer of other part of left lower leg with fat layer exposed: Secondary | ICD-10-CM | POA: Diagnosis not present

## 2014-10-14 DIAGNOSIS — I4891 Unspecified atrial fibrillation: Secondary | ICD-10-CM | POA: Diagnosis not present

## 2014-10-14 DIAGNOSIS — I83028 Varicose veins of left lower extremity with ulcer other part of lower leg: Secondary | ICD-10-CM | POA: Diagnosis not present

## 2014-10-14 DIAGNOSIS — L97829 Non-pressure chronic ulcer of other part of left lower leg with unspecified severity: Secondary | ICD-10-CM | POA: Diagnosis not present

## 2014-10-14 DIAGNOSIS — I10 Essential (primary) hypertension: Secondary | ICD-10-CM | POA: Diagnosis not present

## 2014-10-14 DIAGNOSIS — Z79899 Other long term (current) drug therapy: Secondary | ICD-10-CM | POA: Diagnosis not present

## 2014-10-21 DIAGNOSIS — I10 Essential (primary) hypertension: Secondary | ICD-10-CM | POA: Diagnosis not present

## 2014-10-21 DIAGNOSIS — I83028 Varicose veins of left lower extremity with ulcer other part of lower leg: Secondary | ICD-10-CM | POA: Diagnosis not present

## 2014-10-21 DIAGNOSIS — E119 Type 2 diabetes mellitus without complications: Secondary | ICD-10-CM | POA: Diagnosis not present

## 2014-10-21 DIAGNOSIS — L97829 Non-pressure chronic ulcer of other part of left lower leg with unspecified severity: Secondary | ICD-10-CM | POA: Diagnosis not present

## 2014-10-21 DIAGNOSIS — Z79899 Other long term (current) drug therapy: Secondary | ICD-10-CM | POA: Diagnosis not present

## 2014-10-21 DIAGNOSIS — I4891 Unspecified atrial fibrillation: Secondary | ICD-10-CM | POA: Diagnosis not present

## 2014-10-21 DIAGNOSIS — L97822 Non-pressure chronic ulcer of other part of left lower leg with fat layer exposed: Secondary | ICD-10-CM | POA: Diagnosis not present

## 2014-10-23 ENCOUNTER — Encounter: Payer: Self-pay | Admitting: Vascular Surgery

## 2014-10-23 ENCOUNTER — Other Ambulatory Visit: Payer: Self-pay

## 2014-10-23 DIAGNOSIS — Z86718 Personal history of other venous thrombosis and embolism: Secondary | ICD-10-CM

## 2014-10-23 DIAGNOSIS — I83009 Varicose veins of unspecified lower extremity with ulcer of unspecified site: Secondary | ICD-10-CM

## 2014-10-23 DIAGNOSIS — L97909 Non-pressure chronic ulcer of unspecified part of unspecified lower leg with unspecified severity: Principal | ICD-10-CM

## 2014-10-24 ENCOUNTER — Ambulatory Visit (HOSPITAL_COMMUNITY)
Admission: RE | Admit: 2014-10-24 | Discharge: 2014-10-24 | Disposition: A | Discharge status: Home / Self Care | Payer: Medicare Other | Source: Ambulatory Visit | Attending: Vascular Surgery | Admitting: Vascular Surgery

## 2014-10-24 ENCOUNTER — Encounter (HOSPITAL_BASED_OUTPATIENT_CLINIC_OR_DEPARTMENT_OTHER): Payer: Medicare Other

## 2014-10-24 ENCOUNTER — Encounter: Payer: Self-pay | Admitting: Vascular Surgery

## 2014-10-24 ENCOUNTER — Ambulatory Visit (INDEPENDENT_AMBULATORY_CARE_PROVIDER_SITE_OTHER)
Admission: RE | Admit: 2014-10-24 | Discharge: 2014-10-24 | Disposition: A | Discharge status: Home / Self Care | Payer: Medicare Other | Source: Ambulatory Visit | Attending: Vascular Surgery | Admitting: Vascular Surgery

## 2014-10-24 ENCOUNTER — Ambulatory Visit (INDEPENDENT_AMBULATORY_CARE_PROVIDER_SITE_OTHER): Payer: Medicare Other | Admitting: Vascular Surgery

## 2014-10-24 VITALS — BP 129/63 | HR 81 | Temp 97.4°F | Resp 16 | Ht 70.0 in | Wt 206.0 lb

## 2014-10-24 DIAGNOSIS — I82812 Embolism and thrombosis of superficial veins of left lower extremities: Secondary | ICD-10-CM | POA: Insufficient documentation

## 2014-10-24 DIAGNOSIS — L97909 Non-pressure chronic ulcer of unspecified part of unspecified lower leg with unspecified severity: Principal | ICD-10-CM

## 2014-10-24 DIAGNOSIS — Z86718 Personal history of other venous thrombosis and embolism: Secondary | ICD-10-CM | POA: Diagnosis not present

## 2014-10-24 DIAGNOSIS — I83009 Varicose veins of unspecified lower extremity with ulcer of unspecified site: Secondary | ICD-10-CM

## 2014-10-24 DIAGNOSIS — R609 Edema, unspecified: Secondary | ICD-10-CM | POA: Insufficient documentation

## 2014-10-24 DIAGNOSIS — I70299 Other atherosclerosis of native arteries of extremities, unspecified extremity: Secondary | ICD-10-CM | POA: Diagnosis not present

## 2014-10-24 NOTE — Progress Notes (Signed)
[facility] [place]  Patient name: CHUCKIE VILLAPUDUA MRN: [id number] DOB: Jun 23, 1927 Sex: male  REASON FOR CONSULT: left leg ulcer. Referred by Dr. Zada Girt from the wound care center.  HPI: Don Hall is a 81 y.o. male who has a history of multiple venous ulcers in the past. He states that these have always healed. He developed his most recent ulcer approximately 6 weeks ago. He has been followed in the wound care center for 4 weeks. They have been doing compression dressings on his wound and it has been very slow to heal. For this reason he was sent for vascular consultation.  His risk factors for peripheral vascular disease include hypertension, hyperlipidemia, and type 2 diabetes. He denies any history of tobacco use. He quit tobacco in 1960. There is no family history of premature cardiovascular disease.  He has had previous DVTs in both lower extremities and is on chronic Coumadin therapy.   Past Medical History  Diagnosis Date  . Essential hypertension, benign   . Hyperlipidemia   . Type 2 diabetes mellitus   . 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 Hemorrhage 01/2010    Spontaneous hemorrhage of right thigh with possible over anticoagulation  . Paroxysmal atrial fibrillation   . GERD (gastroesophageal reflux disease)   . Arthritis   . Spigelian hernia   . Renal calculus, right   . Renal cyst, left   . Diverticulosis   . History of skin cancer   . CKD (chronic kidney disease) stage 3, GFR 30-59 ml/min   . Atherosclerosis of artery of extremity with ulceration 10/24/2014        Family History  Problem Relation Age of Onset  . Colon cancer Brother     Diagnosed at age 16, also with UC  . Pancreatic cancer Sister    SOCIAL HISTORY: Social History  Substance Use Topics  . Smoking status: Former Smoker -- 1.00 packs/day for 14 years    Types: Cigarettes   Quit date: 02/28/1958  . Smokeless tobacco: Never Used  . Alcohol Use: No   No Known Allergies Current Outpatient Prescriptions  Medication Sig Dispense Refill  . atorvastatin (LIPITOR) 10 MG tablet Take 10 mg by mouth daily.    . carboxymethylcellulose (REFRESH) 1 % ophthalmic solution Place 1 drop into both eyes daily as needed (dry eyes).     Marland Kitchen glipiZIDE (GLUCOTROL) 10 MG tablet Take 10 mg by mouth daily.     . Horse Chestnut (VENASTAT) 300 MG CPCR Take 300 mg by mouth daily.     Marland Kitchen lisinopril-hydrochlorothiazide (PRINZIDE,ZESTORETIC) 20-12.5 MG per tablet Take 1 tablet by mouth every morning.     . Multiple Vitamins-Minerals (PRESERVISION AREDS 2 PO) Take 1 tablet by mouth 2 (two) times daily.     . pantoprazole (PROTONIX) 40 MG tablet Take 1 tablet (40 mg total) by mouth daily. 90 tablet 3  . warfarin (COUMADIN) 5 MG tablet Take 1 tablet (5 mg total) by mouth every evening. (Patient taking differently: Take 5 mg by mouth every evening. MANAGED BY PMD) 90 tablet 3  . clindamycin (CLEOCIN) 150 MG capsule Take 1 capsule (150 mg total) by mouth every 6 (six) hours. (Patient not taking: Reported on 10/24/2014) 28 capsule 0   No current facility-administered medications for this visit.   REVIEW OF SYSTEMS: Valu.Nieves ] denotes positive finding; [  ] denotes negative  finding  CARDIOVASCULAR:  [ ]  chest pain   [ ]  chest pressure   [ ]  palpitations   [ ]  orthopnea   [ ]  dyspnea on exertion   [ ]  claudication   [ ]  rest pain   [ ]  DVT   [ ]  phlebitis PULMONARY:   [ ]  productive cough   [ ]  asthma   [ ]  wheezing NEUROLOGIC:   [ ]  weakness  [ ]  paresthesias  [ ]  aphasia  [ ]  amaurosis  [ ]  dizziness HEMATOLOGIC:   [ ]  bleeding problems   [ ]  clotting disorders MUSCULOSKELETAL:  [ ]  joint pain   [ ]  joint swelling Valu.Nieves ] leg swelling GASTROINTESTINAL: [ ]   blood in stool  [ ]   hematemesis GENITOURINARY:  [ ]   dysuria  [ ]   hematuria PSYCHIATRIC:  [ ]  history of major depression INTEGUMENTARY:  [ ]  rashes   Valu.Nieves ] ulcers CONSTITUTIONAL:  [ ]  fever   [ ]  chills  PHYSICAL EXAM: Filed Vitals:   10/24/14 1003  BP: 129/63  Pulse: 81  Temp: 97.4 F (36.3 C)  Resp: 16  Height: 5\' 10"  (1.778 m)  Weight: 206 lb (93.441 kg)  SpO2: 98%   GENERAL: The patient is a well-nourished male, in no acute distress. The vital signs are documented above. CARDIAC: There is a regular rate and rhythm.  VASCULAR: I do not detect any carotid bruits. On the right side he has a femoral pulse and a dorsalis pedis and posterior tibial pulse. On the left side, he has a femoral pulse and a dorsalis pedis and posterior tibial pulse. He has moderate bilateral lower extremity swelling which is worse on the left. PULMONARY: There is good air exchange bilaterally without wheezing or rales. ABDOMEN: Soft and non-tender with normal pitched bowel sounds.  MUSCULOSKELETAL: There are no major deformities or cyanosis. NEUROLOGIC: No focal weakness or paresthesias are detected. SKIN: he has hyperpigmentation bilaterally especially on the left side. He has a venous ulcer which is 5 cm x 5 cm in size. It has reasonable granulation tissue at the base. PSYCHIATRIC: The patient has a normal affect.  DATA:   LOWER EXTREMITY VENOUS DUPLEX: I have independently interpreted the patient's reflux study. There is no evidence of DVT in the left lower extremity. There is no superficial thrombophlebitis identified either. There is some minimally occlusive chronic thrombus in the left small saphenous vein. The patient has valvular incompetence in the left great and small saphenous veins as well as throughout the deep system.  ARTERIAL DOPPLER STUDY: I have independently interpreted his arterial Doppler study which shows triphasic Doppler signals in the dorsalis pedis and posterior tibial positions bilaterally. Patient had noncompressible vessels and ABIs could not be obtained.  MEDICAL ISSUES:  LEFT LOWER EXTREMITY VENOUS STASIS ULCER: I  reassured the patient that he has excellent arterial flow and should have adequate arterial circulation to heal these wounds. We have discussed the importance of intermittent leg elevation in the proper positioning for this. He has been elevating his leg while sitting up which is not effective. In addition he will continue with compression dressings until the wound has healed. We've encouraged him to avoid prolonged sitting and standing. I think he would be a candidate for laser ablation of the left greater saphenous vein once the wound has healed. We'll arrange a follow up visit in 3 months with Dr. Kellie Simmering or Dr. Donnetta Hutching to consider this. Given his wounds he has not been able  to tolerate thigh-high compression stockings.   Deitra Mayo [facility] [place]: [phone]

## 2014-10-28 DIAGNOSIS — L97829 Non-pressure chronic ulcer of other part of left lower leg with unspecified severity: Secondary | ICD-10-CM | POA: Diagnosis not present

## 2014-10-28 DIAGNOSIS — I83028 Varicose veins of left lower extremity with ulcer other part of lower leg: Secondary | ICD-10-CM | POA: Diagnosis not present

## 2014-10-28 DIAGNOSIS — I10 Essential (primary) hypertension: Secondary | ICD-10-CM | POA: Diagnosis not present

## 2014-10-28 DIAGNOSIS — L97822 Non-pressure chronic ulcer of other part of left lower leg with fat layer exposed: Secondary | ICD-10-CM | POA: Diagnosis not present

## 2014-10-28 DIAGNOSIS — Z79899 Other long term (current) drug therapy: Secondary | ICD-10-CM | POA: Diagnosis not present

## 2014-10-28 DIAGNOSIS — E119 Type 2 diabetes mellitus without complications: Secondary | ICD-10-CM | POA: Diagnosis not present

## 2014-10-28 DIAGNOSIS — I4891 Unspecified atrial fibrillation: Secondary | ICD-10-CM | POA: Diagnosis not present

## 2014-10-31 DIAGNOSIS — I482 Chronic atrial fibrillation: Secondary | ICD-10-CM | POA: Diagnosis not present

## 2014-11-04 DIAGNOSIS — I83028 Varicose veins of left lower extremity with ulcer other part of lower leg: Secondary | ICD-10-CM | POA: Diagnosis not present

## 2014-11-04 DIAGNOSIS — L97829 Non-pressure chronic ulcer of other part of left lower leg with unspecified severity: Secondary | ICD-10-CM | POA: Diagnosis not present

## 2014-11-06 DIAGNOSIS — I8290 Acute embolism and thrombosis of unspecified vein: Secondary | ICD-10-CM | POA: Diagnosis not present

## 2014-11-06 DIAGNOSIS — I482 Chronic atrial fibrillation: Secondary | ICD-10-CM | POA: Diagnosis not present

## 2014-11-11 DIAGNOSIS — I83028 Varicose veins of left lower extremity with ulcer other part of lower leg: Secondary | ICD-10-CM | POA: Diagnosis not present

## 2014-11-11 DIAGNOSIS — L97822 Non-pressure chronic ulcer of other part of left lower leg with fat layer exposed: Secondary | ICD-10-CM | POA: Diagnosis not present

## 2014-11-11 DIAGNOSIS — L97829 Non-pressure chronic ulcer of other part of left lower leg with unspecified severity: Secondary | ICD-10-CM | POA: Diagnosis not present

## 2014-11-18 DIAGNOSIS — L97829 Non-pressure chronic ulcer of other part of left lower leg with unspecified severity: Secondary | ICD-10-CM | POA: Diagnosis not present

## 2014-11-18 DIAGNOSIS — I83028 Varicose veins of left lower extremity with ulcer other part of lower leg: Secondary | ICD-10-CM | POA: Diagnosis not present

## 2014-11-18 DIAGNOSIS — L97822 Non-pressure chronic ulcer of other part of left lower leg with fat layer exposed: Secondary | ICD-10-CM | POA: Diagnosis not present

## 2014-11-19 DIAGNOSIS — R197 Diarrhea, unspecified: Secondary | ICD-10-CM | POA: Diagnosis not present

## 2014-11-20 DIAGNOSIS — R197 Diarrhea, unspecified: Secondary | ICD-10-CM | POA: Diagnosis not present

## 2014-11-21 DIAGNOSIS — I482 Chronic atrial fibrillation: Secondary | ICD-10-CM | POA: Diagnosis not present

## 2014-11-21 DIAGNOSIS — I8290 Acute embolism and thrombosis of unspecified vein: Secondary | ICD-10-CM | POA: Diagnosis not present

## 2014-11-24 DIAGNOSIS — N200 Calculus of kidney: Secondary | ICD-10-CM | POA: Diagnosis not present

## 2014-11-24 DIAGNOSIS — I482 Chronic atrial fibrillation: Secondary | ICD-10-CM | POA: Diagnosis not present

## 2014-11-24 DIAGNOSIS — A047 Enterocolitis due to Clostridium difficile: Secondary | ICD-10-CM | POA: Diagnosis not present

## 2014-11-25 DIAGNOSIS — K219 Gastro-esophageal reflux disease without esophagitis: Secondary | ICD-10-CM | POA: Diagnosis present

## 2014-11-25 DIAGNOSIS — E785 Hyperlipidemia, unspecified: Secondary | ICD-10-CM | POA: Diagnosis present

## 2014-11-25 DIAGNOSIS — E119 Type 2 diabetes mellitus without complications: Secondary | ICD-10-CM | POA: Diagnosis present

## 2014-11-25 DIAGNOSIS — A047 Enterocolitis due to Clostridium difficile: Secondary | ICD-10-CM | POA: Diagnosis not present

## 2014-11-25 DIAGNOSIS — D631 Anemia in chronic kidney disease: Secondary | ICD-10-CM | POA: Diagnosis present

## 2014-11-25 DIAGNOSIS — Z79899 Other long term (current) drug therapy: Secondary | ICD-10-CM | POA: Diagnosis not present

## 2014-11-25 DIAGNOSIS — N183 Chronic kidney disease, stage 3 (moderate): Secondary | ICD-10-CM | POA: Diagnosis present

## 2014-11-25 DIAGNOSIS — I129 Hypertensive chronic kidney disease with stage 1 through stage 4 chronic kidney disease, or unspecified chronic kidney disease: Secondary | ICD-10-CM | POA: Diagnosis present

## 2014-11-25 DIAGNOSIS — Z23 Encounter for immunization: Secondary | ICD-10-CM | POA: Diagnosis not present

## 2014-11-25 DIAGNOSIS — N179 Acute kidney failure, unspecified: Secondary | ICD-10-CM | POA: Diagnosis present

## 2014-11-29 DIAGNOSIS — N183 Chronic kidney disease, stage 3 (moderate): Secondary | ICD-10-CM | POA: Diagnosis not present

## 2014-11-29 DIAGNOSIS — A047 Enterocolitis due to Clostridium difficile: Secondary | ICD-10-CM | POA: Diagnosis not present

## 2014-11-29 DIAGNOSIS — K219 Gastro-esophageal reflux disease without esophagitis: Secondary | ICD-10-CM | POA: Diagnosis not present

## 2014-11-29 DIAGNOSIS — D649 Anemia, unspecified: Secondary | ICD-10-CM | POA: Diagnosis not present

## 2014-11-29 DIAGNOSIS — I4891 Unspecified atrial fibrillation: Secondary | ICD-10-CM | POA: Diagnosis not present

## 2014-11-29 DIAGNOSIS — I129 Hypertensive chronic kidney disease with stage 1 through stage 4 chronic kidney disease, or unspecified chronic kidney disease: Secondary | ICD-10-CM | POA: Diagnosis not present

## 2014-11-29 DIAGNOSIS — E119 Type 2 diabetes mellitus without complications: Secondary | ICD-10-CM | POA: Diagnosis not present

## 2014-11-29 DIAGNOSIS — Z7901 Long term (current) use of anticoagulants: Secondary | ICD-10-CM | POA: Diagnosis not present

## 2014-11-29 DIAGNOSIS — E785 Hyperlipidemia, unspecified: Secondary | ICD-10-CM | POA: Diagnosis not present

## 2014-11-29 DIAGNOSIS — Z5181 Encounter for therapeutic drug level monitoring: Secondary | ICD-10-CM | POA: Diagnosis not present

## 2014-12-01 DIAGNOSIS — L97829 Non-pressure chronic ulcer of other part of left lower leg with unspecified severity: Secondary | ICD-10-CM | POA: Diagnosis not present

## 2014-12-01 DIAGNOSIS — N183 Chronic kidney disease, stage 3 (moderate): Secondary | ICD-10-CM | POA: Diagnosis not present

## 2014-12-01 DIAGNOSIS — I129 Hypertensive chronic kidney disease with stage 1 through stage 4 chronic kidney disease, or unspecified chronic kidney disease: Secondary | ICD-10-CM | POA: Diagnosis not present

## 2014-12-01 DIAGNOSIS — D649 Anemia, unspecified: Secondary | ICD-10-CM | POA: Diagnosis not present

## 2014-12-01 DIAGNOSIS — I4891 Unspecified atrial fibrillation: Secondary | ICD-10-CM | POA: Diagnosis not present

## 2014-12-01 DIAGNOSIS — I83028 Varicose veins of left lower extremity with ulcer other part of lower leg: Secondary | ICD-10-CM | POA: Diagnosis not present

## 2014-12-01 DIAGNOSIS — A047 Enterocolitis due to Clostridium difficile: Secondary | ICD-10-CM | POA: Diagnosis not present

## 2014-12-01 DIAGNOSIS — E119 Type 2 diabetes mellitus without complications: Secondary | ICD-10-CM | POA: Diagnosis not present

## 2014-12-02 DIAGNOSIS — I4891 Unspecified atrial fibrillation: Secondary | ICD-10-CM | POA: Diagnosis not present

## 2014-12-02 DIAGNOSIS — N183 Chronic kidney disease, stage 3 (moderate): Secondary | ICD-10-CM | POA: Diagnosis not present

## 2014-12-02 DIAGNOSIS — A047 Enterocolitis due to Clostridium difficile: Secondary | ICD-10-CM | POA: Diagnosis not present

## 2014-12-02 DIAGNOSIS — E119 Type 2 diabetes mellitus without complications: Secondary | ICD-10-CM | POA: Diagnosis not present

## 2014-12-02 DIAGNOSIS — I129 Hypertensive chronic kidney disease with stage 1 through stage 4 chronic kidney disease, or unspecified chronic kidney disease: Secondary | ICD-10-CM | POA: Diagnosis not present

## 2014-12-02 DIAGNOSIS — D649 Anemia, unspecified: Secondary | ICD-10-CM | POA: Diagnosis not present

## 2014-12-03 DIAGNOSIS — I482 Chronic atrial fibrillation: Secondary | ICD-10-CM | POA: Diagnosis not present

## 2014-12-04 DIAGNOSIS — N183 Chronic kidney disease, stage 3 (moderate): Secondary | ICD-10-CM | POA: Diagnosis not present

## 2014-12-04 DIAGNOSIS — E119 Type 2 diabetes mellitus without complications: Secondary | ICD-10-CM | POA: Diagnosis not present

## 2014-12-04 DIAGNOSIS — I4891 Unspecified atrial fibrillation: Secondary | ICD-10-CM | POA: Diagnosis not present

## 2014-12-04 DIAGNOSIS — D649 Anemia, unspecified: Secondary | ICD-10-CM | POA: Diagnosis not present

## 2014-12-04 DIAGNOSIS — I129 Hypertensive chronic kidney disease with stage 1 through stage 4 chronic kidney disease, or unspecified chronic kidney disease: Secondary | ICD-10-CM | POA: Diagnosis not present

## 2014-12-04 DIAGNOSIS — A047 Enterocolitis due to Clostridium difficile: Secondary | ICD-10-CM | POA: Diagnosis not present

## 2014-12-08 DIAGNOSIS — I482 Chronic atrial fibrillation: Secondary | ICD-10-CM | POA: Diagnosis not present

## 2014-12-09 DIAGNOSIS — L97829 Non-pressure chronic ulcer of other part of left lower leg with unspecified severity: Secondary | ICD-10-CM | POA: Diagnosis not present

## 2014-12-09 DIAGNOSIS — L97822 Non-pressure chronic ulcer of other part of left lower leg with fat layer exposed: Secondary | ICD-10-CM | POA: Diagnosis not present

## 2014-12-09 DIAGNOSIS — I83028 Varicose veins of left lower extremity with ulcer other part of lower leg: Secondary | ICD-10-CM | POA: Diagnosis not present

## 2014-12-10 DIAGNOSIS — D649 Anemia, unspecified: Secondary | ICD-10-CM | POA: Diagnosis not present

## 2014-12-10 DIAGNOSIS — I129 Hypertensive chronic kidney disease with stage 1 through stage 4 chronic kidney disease, or unspecified chronic kidney disease: Secondary | ICD-10-CM | POA: Diagnosis not present

## 2014-12-10 DIAGNOSIS — A047 Enterocolitis due to Clostridium difficile: Secondary | ICD-10-CM | POA: Diagnosis not present

## 2014-12-10 DIAGNOSIS — N183 Chronic kidney disease, stage 3 (moderate): Secondary | ICD-10-CM | POA: Diagnosis not present

## 2014-12-10 DIAGNOSIS — E119 Type 2 diabetes mellitus without complications: Secondary | ICD-10-CM | POA: Diagnosis not present

## 2014-12-10 DIAGNOSIS — I4891 Unspecified atrial fibrillation: Secondary | ICD-10-CM | POA: Diagnosis not present

## 2014-12-13 DIAGNOSIS — A047 Enterocolitis due to Clostridium difficile: Secondary | ICD-10-CM | POA: Diagnosis not present

## 2014-12-16 DIAGNOSIS — I129 Hypertensive chronic kidney disease with stage 1 through stage 4 chronic kidney disease, or unspecified chronic kidney disease: Secondary | ICD-10-CM | POA: Diagnosis not present

## 2014-12-16 DIAGNOSIS — I4891 Unspecified atrial fibrillation: Secondary | ICD-10-CM | POA: Diagnosis not present

## 2014-12-16 DIAGNOSIS — A047 Enterocolitis due to Clostridium difficile: Secondary | ICD-10-CM | POA: Diagnosis not present

## 2014-12-16 DIAGNOSIS — L97829 Non-pressure chronic ulcer of other part of left lower leg with unspecified severity: Secondary | ICD-10-CM | POA: Diagnosis not present

## 2014-12-16 DIAGNOSIS — D649 Anemia, unspecified: Secondary | ICD-10-CM | POA: Diagnosis not present

## 2014-12-16 DIAGNOSIS — I83028 Varicose veins of left lower extremity with ulcer other part of lower leg: Secondary | ICD-10-CM | POA: Diagnosis not present

## 2014-12-16 DIAGNOSIS — L97822 Non-pressure chronic ulcer of other part of left lower leg with fat layer exposed: Secondary | ICD-10-CM | POA: Diagnosis not present

## 2014-12-16 DIAGNOSIS — N183 Chronic kidney disease, stage 3 (moderate): Secondary | ICD-10-CM | POA: Diagnosis not present

## 2014-12-16 DIAGNOSIS — E119 Type 2 diabetes mellitus without complications: Secondary | ICD-10-CM | POA: Diagnosis not present

## 2014-12-23 DIAGNOSIS — I83028 Varicose veins of left lower extremity with ulcer other part of lower leg: Secondary | ICD-10-CM | POA: Diagnosis not present

## 2014-12-23 DIAGNOSIS — L97822 Non-pressure chronic ulcer of other part of left lower leg with fat layer exposed: Secondary | ICD-10-CM | POA: Diagnosis not present

## 2014-12-23 DIAGNOSIS — L97829 Non-pressure chronic ulcer of other part of left lower leg with unspecified severity: Secondary | ICD-10-CM | POA: Diagnosis not present

## 2014-12-24 DIAGNOSIS — E119 Type 2 diabetes mellitus without complications: Secondary | ICD-10-CM | POA: Diagnosis not present

## 2014-12-24 DIAGNOSIS — D649 Anemia, unspecified: Secondary | ICD-10-CM | POA: Diagnosis not present

## 2014-12-24 DIAGNOSIS — I129 Hypertensive chronic kidney disease with stage 1 through stage 4 chronic kidney disease, or unspecified chronic kidney disease: Secondary | ICD-10-CM | POA: Diagnosis not present

## 2014-12-24 DIAGNOSIS — N183 Chronic kidney disease, stage 3 (moderate): Secondary | ICD-10-CM | POA: Diagnosis not present

## 2014-12-24 DIAGNOSIS — A047 Enterocolitis due to Clostridium difficile: Secondary | ICD-10-CM | POA: Diagnosis not present

## 2014-12-24 DIAGNOSIS — I4891 Unspecified atrial fibrillation: Secondary | ICD-10-CM | POA: Diagnosis not present

## 2014-12-25 DIAGNOSIS — I482 Chronic atrial fibrillation: Secondary | ICD-10-CM | POA: Diagnosis not present

## 2014-12-25 DIAGNOSIS — A047 Enterocolitis due to Clostridium difficile: Secondary | ICD-10-CM | POA: Diagnosis not present

## 2014-12-25 DIAGNOSIS — M7552 Bursitis of left shoulder: Secondary | ICD-10-CM | POA: Diagnosis not present

## 2014-12-28 DIAGNOSIS — I482 Chronic atrial fibrillation: Secondary | ICD-10-CM | POA: Diagnosis not present

## 2014-12-28 DIAGNOSIS — A047 Enterocolitis due to Clostridium difficile: Secondary | ICD-10-CM | POA: Diagnosis not present

## 2014-12-29 DIAGNOSIS — I8290 Acute embolism and thrombosis of unspecified vein: Secondary | ICD-10-CM | POA: Diagnosis not present

## 2014-12-31 DIAGNOSIS — L570 Actinic keratosis: Secondary | ICD-10-CM | POA: Diagnosis not present

## 2015-01-01 ENCOUNTER — Ambulatory Visit (INDEPENDENT_AMBULATORY_CARE_PROVIDER_SITE_OTHER): Payer: Medicare Other | Admitting: Cardiology

## 2015-01-01 ENCOUNTER — Encounter: Payer: Self-pay | Admitting: Cardiology

## 2015-01-01 VITALS — BP 112/62 | HR 80 | Ht 70.0 in | Wt 205.0 lb

## 2015-01-01 DIAGNOSIS — I70299 Other atherosclerosis of native arteries of extremities, unspecified extremity: Secondary | ICD-10-CM | POA: Diagnosis not present

## 2015-01-01 DIAGNOSIS — I480 Paroxysmal atrial fibrillation: Secondary | ICD-10-CM

## 2015-01-01 DIAGNOSIS — I10 Essential (primary) hypertension: Secondary | ICD-10-CM | POA: Diagnosis not present

## 2015-01-01 DIAGNOSIS — L97909 Non-pressure chronic ulcer of unspecified part of unspecified lower leg with unspecified severity: Secondary | ICD-10-CM | POA: Diagnosis not present

## 2015-01-01 NOTE — Progress Notes (Signed)
Cardiology Office Note  Date: 01/01/2015   ID: Don Hall, DOB Aug 06, 1927, MRN [id number]  PCP: Don Ponto, MD  Primary Cardiologist: Don Lesches, MD   Chief Complaint  Patient presents with  . PAF    History of Present Illness: Don Hall is an 81 y.o. male last seen in April. He presents for a routine follow-up visit. Describes occasional palpitations, but no prolonged episodes or change in stamina. We reviewed his medications which are outlined below. ECG today shows sinus rhythm with frequent PACs and LVH.  Interval evaluation by Dr. Scot Hall with [facility] in August noted. He did not have evidence of significant lower extremity arterial obstructive disease at that point. He has a history of venous ulcers.  He continues on Coumadin which is followed by Don Hall. No reported bleeding problems.  Past Medical History  Diagnosis Date  . Essential hypertension, benign   . Hyperlipidemia   . Type 2 diabetes mellitus ([place])   . 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 Hemorrhage 01/2010    Spontaneous hemorrhage of right thigh with possible over anticoagulation  . Paroxysmal atrial fibrillation ([facility])   . GERD (gastroesophageal reflux disease)   . Arthritis   . Spigelian hernia   . Renal calculus, right   . Renal cyst, left   . Diverticulosis   . History of skin cancer   . CKD (chronic kidney disease) stage 3, GFR 30-59 ml/min   . Atherosclerosis of artery of extremity with ulceration ([place]) 10/24/2014         Current Outpatient Prescriptions  Medication Sig Dispense Refill  . atorvastatin (LIPITOR) 10 MG tablet Take 10 mg by mouth daily.    . carboxymethylcellulose (REFRESH) 1 % ophthalmic solution Place 1 drop into both eyes daily as needed (dry eyes).     Marland Kitchen glipiZIDE (GLUCOTROL) 10 MG tablet Take 10 mg by mouth daily.     . Horse Chestnut (VENASTAT) 300 MG CPCR Take 300 mg by mouth  daily.     Marland Kitchen lisinopril-hydrochlorothiazide (PRINZIDE,ZESTORETIC) 20-12.5 MG per tablet Take 1 tablet by mouth every morning.     . Multiple Vitamins-Minerals (PRESERVISION AREDS 2 PO) Take 1 tablet by mouth 2 (two) times daily.     . pantoprazole (PROTONIX) 40 MG tablet Take 1 tablet (40 mg total) by mouth daily. 90 tablet 3  . warfarin (COUMADIN) 5 MG tablet Take 1 tablet (5 mg total) by mouth every evening. (Patient taking differently: Take 5 mg by mouth every evening. MANAGED BY PMD) 90 tablet 3   No current facility-administered medications for this visit.    Allergies:  Review of patient's allergies indicates no known allergies.   Social History: The patient  reports that he quit smoking about 56 years ago. His smoking use included Cigarettes. He has a 14 pack-year smoking history. He has never used smokeless tobacco. He reports that he does not drink alcohol or use illicit drugs.   ROS:  Please see the history of present illness. Otherwise, complete review of systems is positive for leg ulcers, healed at this time.  All other systems are reviewed and negative.   Physical Exam: VS:  BP 112/62 mmHg  Pulse 80  Ht 5\' 10"  (1.778 m)  Wt 205 lb (92.987 kg)  BMI 29.41 kg/m2  SpO2 97%, BMI Body mass index is 29.41 kg/(m^2).  Wt Readings from Last 3 Encounters:  01/01/15 205 lb (92.987 kg)  10/24/14 206 lb (93.441 kg)  10/10/14 210 lb (95.255 kg)     General: Patient appears comfortable at rest. HEENT: Conjunctiva and lids normal, oropharynx clear with moist mucosa. Neck: Supple, no elevated JVP or carotid bruits, no thyromegaly. Lungs: Clear to auscultation, nonlabored breathing at rest. Cardiac: Regular rate and rhythm, no S3 or significant systolic murmur, no pericardial rub. Abdomen: Soft, nontender, no hepatomegaly, bowel sounds present, no guarding or rebound. Extremities: No pitting edema, distal pulses 2+.  ECG: ECG is ordered today.   Recent Labwork: No results found  for requested labs within last 365 days.     Component Value Date/Time   CHOL  03/17/2007 0500    97        ATP III CLASSIFICATION:  <200     mg/dL   Desirable  200-239  mg/dL   Borderline High  >=240    mg/dL   High   TRIG 43 03/17/2007 0500   HDL 29* 03/17/2007 0500   CHOLHDL 3.3 03/17/2007 0500   VLDL 9 03/17/2007 0500   LDLCALC  03/17/2007 0500    59        Total Cholesterol/HDL:CHD Risk Coronary Heart Disease Risk Table                     Men   Women  1/2 Average Risk   3.4   3.3    Other Studies Reviewed Today:  1. Echocardiogram from August 2015 reported mild LVH with LVEF 123456, grade 1 diastolic dysfunction, mild mitral regurgitation, trivial aortic regurgitation, moderate RV dilatation with normal contraction and prominent trabeculation toward the apex, mild tricuspid regurgitation with PASP 42 mm mercury.   2. [facility] Cardiolite also done in August 2015 showed no diagnostic ST segment changes with rare PACs and PVCs, inferolateral soft tissue attenuation without clear evidence of scar or ischemia, LVEF 67%.  Assessment and Plan:  1. Paroxysmal atrial fibrillation. He is in sinus rhythm with frequent PACs today. Continue Coumadin with follow-up per Don Hall.  2. Essential hypertension, blood pressure is normal today.  Current medicines were reviewed with the patient today.   Orders Placed This Encounter  Procedures  . EKG 12-Lead    Disposition: FU with me in 6 months.   Signed, Don Sark, MD, [facility] 01/01/2015 10:55 AM    [name], [place], [place] [address] Phone: [phone]; Fax: [phone]

## 2015-01-01 NOTE — Patient Instructions (Signed)
Continue all current medications. Your physician wants you to follow up in: 6 months.  You will receive a reminder letter in the mail one-two months in advance.  If you don't receive a letter, please call our office to schedule the follow up appointment   

## 2015-01-07 ENCOUNTER — Encounter: Payer: Self-pay | Admitting: Diagnostic Radiology

## 2015-01-20 ENCOUNTER — Encounter: Payer: Self-pay | Admitting: Vascular Surgery

## 2015-01-26 DIAGNOSIS — M75102 Unspecified rotator cuff tear or rupture of left shoulder, not specified as traumatic: Secondary | ICD-10-CM | POA: Diagnosis not present

## 2015-01-26 DIAGNOSIS — I482 Chronic atrial fibrillation: Secondary | ICD-10-CM | POA: Diagnosis not present

## 2015-01-27 ENCOUNTER — Ambulatory Visit (INDEPENDENT_AMBULATORY_CARE_PROVIDER_SITE_OTHER): Payer: Medicare Other | Admitting: Vascular Surgery

## 2015-01-27 ENCOUNTER — Encounter: Payer: Self-pay | Admitting: Vascular Surgery

## 2015-01-27 VITALS — BP 150/60 | HR 67 | Temp 97.8°F | Resp 18 | Ht 70.0 in | Wt 207.4 lb

## 2015-01-27 DIAGNOSIS — L97909 Non-pressure chronic ulcer of unspecified part of unspecified lower leg with unspecified severity: Secondary | ICD-10-CM

## 2015-01-27 DIAGNOSIS — I825Z9 Chronic embolism and thrombosis of unspecified deep veins of unspecified distal lower extremity: Secondary | ICD-10-CM

## 2015-01-27 DIAGNOSIS — L97229 Non-pressure chronic ulcer of left calf with unspecified severity: Principal | ICD-10-CM

## 2015-01-27 DIAGNOSIS — I83022 Varicose veins of left lower extremity with ulcer of calf: Secondary | ICD-10-CM | POA: Diagnosis not present

## 2015-01-27 DIAGNOSIS — I70299 Other atherosclerosis of native arteries of extremities, unspecified extremity: Secondary | ICD-10-CM

## 2015-01-27 MED ORDER — ENOXAPARIN SODIUM 150 MG/ML ~~LOC~~ SOLN: SUBCUTANEOUS | Status: DC
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
## 2015-01-27 NOTE — Progress Notes (Signed)
Filed Vitals:   01/27/15 0936 01/27/15 0945  BP: 161/62 150/60  Pulse: 67   Temp: 97.8 F (36.6 C)   TempSrc: Oral   Resp: 18   Height: 5\' 10"  (1.778 m)   Weight: 207 lb 6.4 oz (94.076 kg)   SpO2: 98%

## 2015-01-28 ENCOUNTER — Other Ambulatory Visit: Payer: Self-pay | Admitting: *Deleted

## 2015-01-28 DIAGNOSIS — L97229 Non-pressure chronic ulcer of left calf with unspecified severity: Principal | ICD-10-CM

## 2015-01-28 DIAGNOSIS — I83022 Varicose veins of left lower extremity with ulcer of calf: Secondary | ICD-10-CM

## 2015-01-29 ENCOUNTER — Telehealth: Payer: Self-pay | Admitting: *Deleted

## 2015-01-29 DIAGNOSIS — I82592 Chronic embolism and thrombosis of other specified deep vein of left lower extremity: Secondary | ICD-10-CM

## 2015-01-29 MED ORDER — ENOXAPARIN SODIUM 150 MG/ML ~~LOC~~ SOLN: 1.0000 mg/kg | Freq: Two times a day (BID) | SUBCUTANEOUS | Status: DC

## 2015-01-29 NOTE — Telephone Encounter (Signed)
[facility] called with change in their medication policy.  [facility] now requires prescription for medication (Lovenox) and progress note from Dr. Donnetta Hutching to be faxed to [phone].  Per Dr. Donnetta Hutching,  order for Lovenox to be changed to 80 mg SQ every 12 hours with 12 syringes to be dispensed.  Per Dr. Donnetta Hutching,  Don Hall will start Lovenox 80 mg SQ on 03-02-2015.

## 2015-02-05 ENCOUNTER — Other Ambulatory Visit: Payer: Self-pay | Admitting: *Deleted

## 2015-02-09 DIAGNOSIS — E875 Hyperkalemia: Secondary | ICD-10-CM | POA: Diagnosis not present

## 2015-02-09 DIAGNOSIS — Z79899 Other long term (current) drug therapy: Secondary | ICD-10-CM | POA: Diagnosis not present

## 2015-02-09 DIAGNOSIS — I129 Hypertensive chronic kidney disease with stage 1 through stage 4 chronic kidney disease, or unspecified chronic kidney disease: Secondary | ICD-10-CM | POA: Diagnosis not present

## 2015-02-09 DIAGNOSIS — Z86718 Personal history of other venous thrombosis and embolism: Secondary | ICD-10-CM | POA: Diagnosis not present

## 2015-02-09 DIAGNOSIS — Z9049 Acquired absence of other specified parts of digestive tract: Secondary | ICD-10-CM | POA: Diagnosis not present

## 2015-02-09 DIAGNOSIS — Z9889 Other specified postprocedural states: Secondary | ICD-10-CM | POA: Diagnosis not present

## 2015-02-09 DIAGNOSIS — N183 Chronic kidney disease, stage 3 (moderate): Secondary | ICD-10-CM | POA: Diagnosis not present

## 2015-02-09 DIAGNOSIS — L905 Scar conditions and fibrosis of skin: Secondary | ICD-10-CM | POA: Diagnosis not present

## 2015-02-09 DIAGNOSIS — R0609 Other forms of dyspnea: Secondary | ICD-10-CM | POA: Diagnosis not present

## 2015-02-09 DIAGNOSIS — N189 Chronic kidney disease, unspecified: Secondary | ICD-10-CM | POA: Diagnosis not present

## 2015-02-09 DIAGNOSIS — Z96659 Presence of unspecified artificial knee joint: Secondary | ICD-10-CM | POA: Diagnosis not present

## 2015-02-09 DIAGNOSIS — E1121 Type 2 diabetes mellitus with diabetic nephropathy: Secondary | ICD-10-CM | POA: Diagnosis not present

## 2015-02-09 DIAGNOSIS — E1122 Type 2 diabetes mellitus with diabetic chronic kidney disease: Secondary | ICD-10-CM | POA: Diagnosis not present

## 2015-02-09 DIAGNOSIS — Z7901 Long term (current) use of anticoagulants: Secondary | ICD-10-CM | POA: Diagnosis not present

## 2015-02-09 DIAGNOSIS — I8392 Asymptomatic varicose veins of left lower extremity: Secondary | ICD-10-CM | POA: Diagnosis not present

## 2015-02-09 DIAGNOSIS — N281 Cyst of kidney, acquired: Secondary | ICD-10-CM | POA: Diagnosis not present

## 2015-02-11 DIAGNOSIS — I482 Chronic atrial fibrillation: Secondary | ICD-10-CM | POA: Diagnosis not present

## 2015-02-13 DIAGNOSIS — E1165 Type 2 diabetes mellitus with hyperglycemia: Secondary | ICD-10-CM | POA: Diagnosis not present

## 2015-02-13 DIAGNOSIS — N183 Chronic kidney disease, stage 3 (moderate): Secondary | ICD-10-CM | POA: Diagnosis not present

## 2015-02-13 DIAGNOSIS — E782 Mixed hyperlipidemia: Secondary | ICD-10-CM | POA: Diagnosis not present

## 2015-02-13 DIAGNOSIS — K210 Gastro-esophageal reflux disease with esophagitis: Secondary | ICD-10-CM | POA: Diagnosis not present

## 2015-02-13 DIAGNOSIS — I10 Essential (primary) hypertension: Secondary | ICD-10-CM | POA: Diagnosis not present

## 2015-02-17 DIAGNOSIS — I10 Essential (primary) hypertension: Secondary | ICD-10-CM | POA: Diagnosis not present

## 2015-02-17 DIAGNOSIS — M4727 Other spondylosis with radiculopathy, lumbosacral region: Secondary | ICD-10-CM | POA: Diagnosis not present

## 2015-02-17 DIAGNOSIS — E1122 Type 2 diabetes mellitus with diabetic chronic kidney disease: Secondary | ICD-10-CM | POA: Diagnosis not present

## 2015-02-17 DIAGNOSIS — D649 Anemia, unspecified: Secondary | ICD-10-CM | POA: Diagnosis not present

## 2015-02-17 DIAGNOSIS — E782 Mixed hyperlipidemia: Secondary | ICD-10-CM | POA: Diagnosis not present

## 2015-02-17 DIAGNOSIS — G6289 Other specified polyneuropathies: Secondary | ICD-10-CM | POA: Diagnosis not present

## 2015-02-17 DIAGNOSIS — I482 Chronic atrial fibrillation: Secondary | ICD-10-CM | POA: Diagnosis not present

## 2015-02-17 DIAGNOSIS — M353 Polymyalgia rheumatica: Secondary | ICD-10-CM | POA: Diagnosis not present

## 2015-02-17 DIAGNOSIS — E1142 Type 2 diabetes mellitus with diabetic polyneuropathy: Secondary | ICD-10-CM | POA: Diagnosis not present

## 2015-02-18 DIAGNOSIS — D485 Neoplasm of uncertain behavior of skin: Secondary | ICD-10-CM | POA: Diagnosis not present

## 2015-02-18 DIAGNOSIS — D044 Carcinoma in situ of skin of scalp and neck: Secondary | ICD-10-CM | POA: Diagnosis not present

## 2015-02-18 DIAGNOSIS — L570 Actinic keratosis: Secondary | ICD-10-CM | POA: Diagnosis not present

## 2015-02-18 DIAGNOSIS — L821 Other seborrheic keratosis: Secondary | ICD-10-CM | POA: Diagnosis not present

## 2015-02-18 DIAGNOSIS — Z85828 Personal history of other malignant neoplasm of skin: Secondary | ICD-10-CM | POA: Diagnosis not present

## 2015-03-05 ENCOUNTER — Other Ambulatory Visit: Payer: Medicare Other | Admitting: Vascular Surgery

## 2015-03-05 DIAGNOSIS — C4442 Squamous cell carcinoma of skin of scalp and neck: Secondary | ICD-10-CM | POA: Diagnosis not present

## 2015-03-11 DIAGNOSIS — I482 Chronic atrial fibrillation: Secondary | ICD-10-CM | POA: Diagnosis not present

## 2015-03-12 ENCOUNTER — Encounter (HOSPITAL_COMMUNITY): Payer: Medicare Other

## 2015-03-12 ENCOUNTER — Ambulatory Visit: Payer: Medicare Other | Admitting: Vascular Surgery

## 2015-04-10 DIAGNOSIS — J189 Pneumonia, unspecified organism: Secondary | ICD-10-CM | POA: Diagnosis not present

## 2015-04-10 DIAGNOSIS — J111 Influenza due to unidentified influenza virus with other respiratory manifestations: Secondary | ICD-10-CM | POA: Diagnosis not present

## 2015-04-14 DIAGNOSIS — I8290 Acute embolism and thrombosis of unspecified vein: Secondary | ICD-10-CM | POA: Diagnosis not present

## 2015-04-23 DIAGNOSIS — I482 Chronic atrial fibrillation: Secondary | ICD-10-CM | POA: Diagnosis not present

## 2015-05-05 DIAGNOSIS — E1342 Other specified diabetes mellitus with diabetic polyneuropathy: Secondary | ICD-10-CM | POA: Diagnosis not present

## 2015-05-05 DIAGNOSIS — L851 Acquired keratosis [keratoderma] palmaris et plantaris: Secondary | ICD-10-CM | POA: Diagnosis not present

## 2015-05-05 DIAGNOSIS — B351 Tinea unguium: Secondary | ICD-10-CM | POA: Diagnosis not present

## 2015-05-07 DIAGNOSIS — I482 Chronic atrial fibrillation: Secondary | ICD-10-CM | POA: Diagnosis not present

## 2015-05-19 DIAGNOSIS — Z85828 Personal history of other malignant neoplasm of skin: Secondary | ICD-10-CM | POA: Diagnosis not present

## 2015-05-19 DIAGNOSIS — L570 Actinic keratosis: Secondary | ICD-10-CM | POA: Diagnosis not present

## 2015-05-19 DIAGNOSIS — L219 Seborrheic dermatitis, unspecified: Secondary | ICD-10-CM | POA: Diagnosis not present

## 2015-06-08 DIAGNOSIS — I482 Chronic atrial fibrillation: Secondary | ICD-10-CM | POA: Diagnosis not present

## 2015-06-16 DIAGNOSIS — E1165 Type 2 diabetes mellitus with hyperglycemia: Secondary | ICD-10-CM | POA: Diagnosis not present

## 2015-06-16 DIAGNOSIS — I10 Essential (primary) hypertension: Secondary | ICD-10-CM | POA: Diagnosis not present

## 2015-06-16 DIAGNOSIS — K219 Gastro-esophageal reflux disease without esophagitis: Secondary | ICD-10-CM | POA: Diagnosis not present

## 2015-06-16 DIAGNOSIS — E782 Mixed hyperlipidemia: Secondary | ICD-10-CM | POA: Diagnosis not present

## 2015-06-16 DIAGNOSIS — N183 Chronic kidney disease, stage 3 (moderate): Secondary | ICD-10-CM | POA: Diagnosis not present

## 2015-06-22 DIAGNOSIS — I10 Essential (primary) hypertension: Secondary | ICD-10-CM | POA: Diagnosis not present

## 2015-06-22 DIAGNOSIS — D649 Anemia, unspecified: Secondary | ICD-10-CM | POA: Diagnosis not present

## 2015-06-22 DIAGNOSIS — G6289 Other specified polyneuropathies: Secondary | ICD-10-CM | POA: Diagnosis not present

## 2015-06-22 DIAGNOSIS — E782 Mixed hyperlipidemia: Secondary | ICD-10-CM | POA: Diagnosis not present

## 2015-06-22 DIAGNOSIS — E1142 Type 2 diabetes mellitus with diabetic polyneuropathy: Secondary | ICD-10-CM | POA: Diagnosis not present

## 2015-06-22 DIAGNOSIS — S40012A Contusion of left shoulder, initial encounter: Secondary | ICD-10-CM | POA: Diagnosis not present

## 2015-06-22 DIAGNOSIS — E1122 Type 2 diabetes mellitus with diabetic chronic kidney disease: Secondary | ICD-10-CM | POA: Diagnosis not present

## 2015-06-22 DIAGNOSIS — S5001XA Contusion of right elbow, initial encounter: Secondary | ICD-10-CM | POA: Diagnosis not present

## 2015-07-06 DIAGNOSIS — I482 Chronic atrial fibrillation: Secondary | ICD-10-CM | POA: Diagnosis not present

## 2015-07-14 DIAGNOSIS — L851 Acquired keratosis [keratoderma] palmaris et plantaris: Secondary | ICD-10-CM | POA: Diagnosis not present

## 2015-07-14 DIAGNOSIS — I482 Chronic atrial fibrillation: Secondary | ICD-10-CM | POA: Diagnosis not present

## 2015-07-14 DIAGNOSIS — E1342 Other specified diabetes mellitus with diabetic polyneuropathy: Secondary | ICD-10-CM | POA: Diagnosis not present

## 2015-07-14 DIAGNOSIS — I8290 Acute embolism and thrombosis of unspecified vein: Secondary | ICD-10-CM | POA: Diagnosis not present

## 2015-07-14 DIAGNOSIS — B351 Tinea unguium: Secondary | ICD-10-CM | POA: Diagnosis not present

## 2015-07-28 ENCOUNTER — Ambulatory Visit (INDEPENDENT_AMBULATORY_CARE_PROVIDER_SITE_OTHER): Payer: Medicare Other | Admitting: Cardiology

## 2015-07-28 ENCOUNTER — Encounter: Payer: Self-pay | Admitting: Cardiology

## 2015-07-28 VITALS — BP 112/66 | HR 67 | Ht 70.0 in | Wt 208.4 lb

## 2015-07-28 DIAGNOSIS — I10 Essential (primary) hypertension: Secondary | ICD-10-CM | POA: Diagnosis not present

## 2015-07-28 DIAGNOSIS — I480 Paroxysmal atrial fibrillation: Secondary | ICD-10-CM

## 2015-07-28 NOTE — Patient Instructions (Signed)
Your physician recommends that you continue on your current medications as directed. Please refer to the Current Medication list given to you today. Your physician recommends that you schedule a follow-up appointment in: 6 months. You will receive a reminder letter in the mail in about 4 months reminding you to call and schedule your appointment. If you don't receive this letter, please contact our office. 

## 2015-07-28 NOTE — Progress Notes (Signed)
Cardiology Office Note  Date: 07/28/2015   ID: COLLEEN LAMUNYON, DOB 1927/06/30, MRN [id number]  PCP: [name], MD  Primary Cardiologist: Rozann Lesches, MD   Chief Complaint  Patient presents with  . Atrial Fibrillation    History of Present Illness: Don Hall is an 82 y.o. male last seen in November 2016. He presents for a routine follow-up visit. Reports experiencing some palpitations back in December 2016, otherwise has been stable.  He continues on Coumadin, followed by Dr. Quillian Quince. Denies any bleeding problems.  He follows with Dr. Donnetta Hutching with history of severe venous stasis disease and ulceration. Reports that this has been doing well over the last several months.  Past Medical History  Diagnosis Date  . Essential hypertension, benign   . Hyperlipidemia   . Type 2 diabetes mellitus ([place])   . 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 Hemorrhage 01/2010    Spontaneous hemorrhage of right thigh with possible over anticoagulation  . Paroxysmal atrial fibrillation (HCC)   . GERD (gastroesophageal reflux disease)   . Arthritis   . Spigelian hernia   . Renal calculus, right   . Renal cyst, left   . Diverticulosis   . History of skin cancer   . CKD (chronic kidney disease) stage 3, GFR 30-59 ml/min   . Atherosclerosis of artery of extremity with ulceration ([place])     Treated at wound clinic in [place]  . DVT (deep venous thrombosis) ([place])   . Varicose veins     Current Outpatient Prescriptions  Medication Sig Dispense Refill  . atorvastatin (LIPITOR) 10 MG tablet Take 10 mg by mouth daily.    . carboxymethylcellulose (REFRESH) 1 % ophthalmic solution Place 1 drop into both eyes daily as needed (dry eyes).     Marland Kitchen glipiZIDE (GLUCOTROL) 10 MG tablet 1/4 - 1/2 tab by mouth daily    . lisinopril-hydrochlorothiazide (PRINZIDE,ZESTORETIC) 20-12.5 MG per tablet Take 1 tablet by mouth every  morning.     Marland Kitchen MISC NATURAL PRODUCTS PO Venastate - take one tab by mouth daily    . Multiple Vitamins-Minerals (PRESERVISION AREDS 2 PO) Take 1 tablet by mouth 2 (two) times daily.     Marland Kitchen warfarin (COUMADIN) 5 MG tablet Take 1 tablet (5 mg total) by mouth every evening. (Patient taking differently: Take 5 mg by mouth every evening. MANAGED BY PMD) 90 tablet 3   No current facility-administered medications for this visit.   Allergies:  Review of patient's allergies indicates no known allergies.   Social History: The patient  reports that he quit smoking about 57 years ago. His smoking use included Cigarettes. He has a 14 pack-year smoking history. He has never used smokeless tobacco. He reports that he does not drink alcohol or use illicit drugs.   ROS:  Please see the history of present illness. Otherwise, complete review of systems is positive for decreased hearing.  All other systems are reviewed and negative.   Physical Exam: VS:  BP 112/66 mmHg  Pulse 67  Ht 5\' 10"  (1.778 m)  Wt 208 lb 6.4 oz (94.53 kg)  BMI 29.90 kg/m2  SpO2 97%, BMI Body mass index is 29.9 kg/(m^2).  Wt Readings from Last 3 Encounters:  07/28/15 208 lb 6.4 oz (94.53 kg)  01/27/15 207 lb 6.4 oz (94.076 kg)  01/01/15 205 lb (92.987 kg)    General: Patient appears comfortable at  rest. HEENT: Conjunctiva and lids normal, oropharynx clear with moist mucosa. Neck: Supple, no elevated JVP or carotid bruits, no thyromegaly. Lungs: Clear to auscultation, nonlabored breathing at rest. Cardiac: Regular rate and rhythm, no S3 or significant systolic murmur, no pericardial rub. Abdomen: Soft, nontender, no hepatomegaly, bowel sounds present, no guarding or rebound. Extremities: No pitting edema, distal pulses 2+.  ECG: I personally reviewed the prior tracing from 01/01/2015 which showed sinus rhythm with PAC, low voltage in the lateral leads, otherwise suggestive of LVH..  Other Studies Reviewed Today:  Echocardiogram  August 2015:  Mild LVH with LVEF 123456, grade 1 diastolic dysfunction, mild mitral regurgitation, trivial aortic regurgitation, moderate RV dilatation with normal contraction and prominent trabeculation toward the apex, mild tricuspid regurgitation with PASP 42 mm mercury.   Lexiscan Cardiolite August 2015:  No diagnostic ST segment changes with rare PACs and PVCs, inferolateral soft tissue attenuation without clear evidence of scar or ischemia, LVEF 67%.  Assessment and Plan:  1. Paroxysmal atrial fibrillation, overall symptomatically stable since last encounter. Heart rate is regular today. He continues on Coumadin for stroke prophylaxis, otherwise no heart rate control medications.  2. Essential hypertension, blood pressure is normal today.  Current medicines were reviewed with the patient today.  Disposition: FU with me in 6 months.   Signed, Satira Sark, MD, [facility] 07/28/2015 1:18 PM    [facility] at [place], [place], [place] [address] Phone: [phone]; Fax: [phone]

## 2015-07-30 DIAGNOSIS — S43492A Other sprain of left shoulder joint, initial encounter: Secondary | ICD-10-CM | POA: Diagnosis not present

## 2015-08-03 DIAGNOSIS — R293 Abnormal posture: Secondary | ICD-10-CM | POA: Diagnosis not present

## 2015-08-03 DIAGNOSIS — M25612 Stiffness of left shoulder, not elsewhere classified: Secondary | ICD-10-CM | POA: Diagnosis not present

## 2015-08-03 DIAGNOSIS — M6281 Muscle weakness (generalized): Secondary | ICD-10-CM | POA: Diagnosis not present

## 2015-08-03 DIAGNOSIS — M25512 Pain in left shoulder: Secondary | ICD-10-CM | POA: Diagnosis not present

## 2015-08-06 DIAGNOSIS — M25612 Stiffness of left shoulder, not elsewhere classified: Secondary | ICD-10-CM | POA: Diagnosis not present

## 2015-08-06 DIAGNOSIS — R293 Abnormal posture: Secondary | ICD-10-CM | POA: Diagnosis not present

## 2015-08-06 DIAGNOSIS — M25512 Pain in left shoulder: Secondary | ICD-10-CM | POA: Diagnosis not present

## 2015-08-06 DIAGNOSIS — M6281 Muscle weakness (generalized): Secondary | ICD-10-CM | POA: Diagnosis not present

## 2015-08-07 DIAGNOSIS — M25612 Stiffness of left shoulder, not elsewhere classified: Secondary | ICD-10-CM | POA: Diagnosis not present

## 2015-08-07 DIAGNOSIS — M6281 Muscle weakness (generalized): Secondary | ICD-10-CM | POA: Diagnosis not present

## 2015-08-07 DIAGNOSIS — M25512 Pain in left shoulder: Secondary | ICD-10-CM | POA: Diagnosis not present

## 2015-08-07 DIAGNOSIS — R293 Abnormal posture: Secondary | ICD-10-CM | POA: Diagnosis not present

## 2015-08-25 DIAGNOSIS — M6281 Muscle weakness (generalized): Secondary | ICD-10-CM | POA: Diagnosis not present

## 2015-08-25 DIAGNOSIS — M25512 Pain in left shoulder: Secondary | ICD-10-CM | POA: Diagnosis not present

## 2015-08-25 DIAGNOSIS — M25612 Stiffness of left shoulder, not elsewhere classified: Secondary | ICD-10-CM | POA: Diagnosis not present

## 2015-08-25 DIAGNOSIS — R293 Abnormal posture: Secondary | ICD-10-CM | POA: Diagnosis not present

## 2015-08-26 DIAGNOSIS — M25512 Pain in left shoulder: Secondary | ICD-10-CM | POA: Diagnosis not present

## 2015-08-26 DIAGNOSIS — Z85828 Personal history of other malignant neoplasm of skin: Secondary | ICD-10-CM | POA: Diagnosis not present

## 2015-08-26 DIAGNOSIS — R293 Abnormal posture: Secondary | ICD-10-CM | POA: Diagnosis not present

## 2015-08-26 DIAGNOSIS — D485 Neoplasm of uncertain behavior of skin: Secondary | ICD-10-CM | POA: Diagnosis not present

## 2015-08-26 DIAGNOSIS — M6281 Muscle weakness (generalized): Secondary | ICD-10-CM | POA: Diagnosis not present

## 2015-08-26 DIAGNOSIS — I482 Chronic atrial fibrillation: Secondary | ICD-10-CM | POA: Diagnosis not present

## 2015-08-26 DIAGNOSIS — L570 Actinic keratosis: Secondary | ICD-10-CM | POA: Diagnosis not present

## 2015-08-26 DIAGNOSIS — M25612 Stiffness of left shoulder, not elsewhere classified: Secondary | ICD-10-CM | POA: Diagnosis not present

## 2015-08-27 DIAGNOSIS — M25612 Stiffness of left shoulder, not elsewhere classified: Secondary | ICD-10-CM | POA: Diagnosis not present

## 2015-08-27 DIAGNOSIS — M6281 Muscle weakness (generalized): Secondary | ICD-10-CM | POA: Diagnosis not present

## 2015-08-27 DIAGNOSIS — M25512 Pain in left shoulder: Secondary | ICD-10-CM | POA: Diagnosis not present

## 2015-08-27 DIAGNOSIS — R293 Abnormal posture: Secondary | ICD-10-CM | POA: Diagnosis not present

## 2015-08-28 DIAGNOSIS — S43492D Other sprain of left shoulder joint, subsequent encounter: Secondary | ICD-10-CM | POA: Diagnosis not present

## 2015-08-31 DIAGNOSIS — M6281 Muscle weakness (generalized): Secondary | ICD-10-CM | POA: Diagnosis not present

## 2015-08-31 DIAGNOSIS — M25612 Stiffness of left shoulder, not elsewhere classified: Secondary | ICD-10-CM | POA: Diagnosis not present

## 2015-08-31 DIAGNOSIS — R293 Abnormal posture: Secondary | ICD-10-CM | POA: Diagnosis not present

## 2015-08-31 DIAGNOSIS — M25512 Pain in left shoulder: Secondary | ICD-10-CM | POA: Diagnosis not present

## 2015-09-03 ENCOUNTER — Other Ambulatory Visit: Payer: Self-pay | Admitting: Specialist

## 2015-09-03 DIAGNOSIS — M25512 Pain in left shoulder: Secondary | ICD-10-CM

## 2015-09-06 ENCOUNTER — Ambulatory Visit
Admission: RE | Admit: 2015-09-06 | Discharge: 2015-09-06 | Disposition: A | Discharge status: Home / Self Care | Payer: Medicare Other | Source: Ambulatory Visit | Attending: Specialist | Admitting: Specialist

## 2015-09-06 DIAGNOSIS — S46012A Strain of muscle(s) and tendon(s) of the rotator cuff of left shoulder, initial encounter: Secondary | ICD-10-CM | POA: Diagnosis not present

## 2015-09-06 DIAGNOSIS — M25512 Pain in left shoulder: Secondary | ICD-10-CM

## 2015-09-10 ENCOUNTER — Ambulatory Visit (INDEPENDENT_AMBULATORY_CARE_PROVIDER_SITE_OTHER): Payer: Medicare Other | Admitting: Ophthalmology

## 2015-09-10 DIAGNOSIS — H353122 Nonexudative age-related macular degeneration, left eye, intermediate dry stage: Secondary | ICD-10-CM

## 2015-09-10 DIAGNOSIS — H35033 Hypertensive retinopathy, bilateral: Secondary | ICD-10-CM | POA: Diagnosis not present

## 2015-09-10 DIAGNOSIS — D3131 Benign neoplasm of right choroid: Secondary | ICD-10-CM

## 2015-09-10 DIAGNOSIS — H43813 Vitreous degeneration, bilateral: Secondary | ICD-10-CM | POA: Diagnosis not present

## 2015-09-10 DIAGNOSIS — H353111 Nonexudative age-related macular degeneration, right eye, early dry stage: Secondary | ICD-10-CM | POA: Diagnosis not present

## 2015-09-10 DIAGNOSIS — I10 Essential (primary) hypertension: Secondary | ICD-10-CM

## 2015-09-14 DIAGNOSIS — M6281 Muscle weakness (generalized): Secondary | ICD-10-CM | POA: Diagnosis not present

## 2015-09-14 DIAGNOSIS — M25512 Pain in left shoulder: Secondary | ICD-10-CM | POA: Diagnosis not present

## 2015-09-14 DIAGNOSIS — M25612 Stiffness of left shoulder, not elsewhere classified: Secondary | ICD-10-CM | POA: Diagnosis not present

## 2015-09-14 DIAGNOSIS — R293 Abnormal posture: Secondary | ICD-10-CM | POA: Diagnosis not present

## 2015-09-17 DIAGNOSIS — M25512 Pain in left shoulder: Secondary | ICD-10-CM | POA: Diagnosis not present

## 2015-09-17 DIAGNOSIS — M25612 Stiffness of left shoulder, not elsewhere classified: Secondary | ICD-10-CM | POA: Diagnosis not present

## 2015-09-17 DIAGNOSIS — R293 Abnormal posture: Secondary | ICD-10-CM | POA: Diagnosis not present

## 2015-09-17 DIAGNOSIS — M6281 Muscle weakness (generalized): Secondary | ICD-10-CM | POA: Diagnosis not present

## 2015-09-21 DIAGNOSIS — M25512 Pain in left shoulder: Secondary | ICD-10-CM | POA: Diagnosis not present

## 2015-09-21 DIAGNOSIS — M6281 Muscle weakness (generalized): Secondary | ICD-10-CM | POA: Diagnosis not present

## 2015-09-21 DIAGNOSIS — M25612 Stiffness of left shoulder, not elsewhere classified: Secondary | ICD-10-CM | POA: Diagnosis not present

## 2015-09-21 DIAGNOSIS — R293 Abnormal posture: Secondary | ICD-10-CM | POA: Diagnosis not present

## 2015-09-22 DIAGNOSIS — B351 Tinea unguium: Secondary | ICD-10-CM | POA: Diagnosis not present

## 2015-09-22 DIAGNOSIS — L851 Acquired keratosis [keratoderma] palmaris et plantaris: Secondary | ICD-10-CM | POA: Diagnosis not present

## 2015-09-22 DIAGNOSIS — E1342 Other specified diabetes mellitus with diabetic polyneuropathy: Secondary | ICD-10-CM | POA: Diagnosis not present

## 2015-09-24 DIAGNOSIS — M25512 Pain in left shoulder: Secondary | ICD-10-CM | POA: Diagnosis not present

## 2015-09-24 DIAGNOSIS — R293 Abnormal posture: Secondary | ICD-10-CM | POA: Diagnosis not present

## 2015-09-24 DIAGNOSIS — M25612 Stiffness of left shoulder, not elsewhere classified: Secondary | ICD-10-CM | POA: Diagnosis not present

## 2015-09-24 DIAGNOSIS — M6281 Muscle weakness (generalized): Secondary | ICD-10-CM | POA: Diagnosis not present

## 2015-09-25 DIAGNOSIS — I482 Chronic atrial fibrillation: Secondary | ICD-10-CM | POA: Diagnosis not present

## 2015-10-07 DIAGNOSIS — E782 Mixed hyperlipidemia: Secondary | ICD-10-CM | POA: Diagnosis not present

## 2015-10-07 DIAGNOSIS — I10 Essential (primary) hypertension: Secondary | ICD-10-CM | POA: Diagnosis not present

## 2015-10-07 DIAGNOSIS — E1165 Type 2 diabetes mellitus with hyperglycemia: Secondary | ICD-10-CM | POA: Diagnosis not present

## 2015-10-07 DIAGNOSIS — N183 Chronic kidney disease, stage 3 (moderate): Secondary | ICD-10-CM | POA: Diagnosis not present

## 2015-10-07 DIAGNOSIS — K219 Gastro-esophageal reflux disease without esophagitis: Secondary | ICD-10-CM | POA: Diagnosis not present

## 2015-10-08 DIAGNOSIS — I10 Essential (primary) hypertension: Secondary | ICD-10-CM | POA: Diagnosis not present

## 2015-10-08 DIAGNOSIS — E1142 Type 2 diabetes mellitus with diabetic polyneuropathy: Secondary | ICD-10-CM | POA: Diagnosis not present

## 2015-10-08 DIAGNOSIS — D649 Anemia, unspecified: Secondary | ICD-10-CM | POA: Diagnosis not present

## 2015-10-08 DIAGNOSIS — M353 Polymyalgia rheumatica: Secondary | ICD-10-CM | POA: Diagnosis not present

## 2015-10-08 DIAGNOSIS — E1122 Type 2 diabetes mellitus with diabetic chronic kidney disease: Secondary | ICD-10-CM | POA: Diagnosis not present

## 2015-10-08 DIAGNOSIS — G6289 Other specified polyneuropathies: Secondary | ICD-10-CM | POA: Diagnosis not present

## 2015-10-08 DIAGNOSIS — Z6828 Body mass index (BMI) 28.0-28.9, adult: Secondary | ICD-10-CM | POA: Diagnosis not present

## 2015-10-08 DIAGNOSIS — E782 Mixed hyperlipidemia: Secondary | ICD-10-CM | POA: Diagnosis not present

## 2015-10-14 DIAGNOSIS — E875 Hyperkalemia: Secondary | ICD-10-CM | POA: Diagnosis not present

## 2015-10-20 DIAGNOSIS — E1122 Type 2 diabetes mellitus with diabetic chronic kidney disease: Secondary | ICD-10-CM | POA: Diagnosis not present

## 2015-10-20 DIAGNOSIS — E782 Mixed hyperlipidemia: Secondary | ICD-10-CM | POA: Diagnosis not present

## 2015-10-20 DIAGNOSIS — I482 Chronic atrial fibrillation: Secondary | ICD-10-CM | POA: Diagnosis not present

## 2015-10-20 DIAGNOSIS — N183 Chronic kidney disease, stage 3 (moderate): Secondary | ICD-10-CM | POA: Diagnosis not present

## 2015-10-20 DIAGNOSIS — D649 Anemia, unspecified: Secondary | ICD-10-CM | POA: Diagnosis not present

## 2015-10-20 DIAGNOSIS — I10 Essential (primary) hypertension: Secondary | ICD-10-CM | POA: Diagnosis not present

## 2015-10-20 DIAGNOSIS — K210 Gastro-esophageal reflux disease with esophagitis: Secondary | ICD-10-CM | POA: Diagnosis not present

## 2015-10-26 DIAGNOSIS — I482 Chronic atrial fibrillation: Secondary | ICD-10-CM | POA: Diagnosis not present

## 2015-10-26 DIAGNOSIS — I8290 Acute embolism and thrombosis of unspecified vein: Secondary | ICD-10-CM | POA: Diagnosis not present

## 2015-10-27 DIAGNOSIS — F331 Major depressive disorder, recurrent, moderate: Secondary | ICD-10-CM | POA: Diagnosis not present

## 2015-10-27 DIAGNOSIS — Z6827 Body mass index (BMI) 27.0-27.9, adult: Secondary | ICD-10-CM | POA: Diagnosis not present

## 2015-10-27 DIAGNOSIS — D692 Other nonthrombocytopenic purpura: Secondary | ICD-10-CM | POA: Diagnosis not present

## 2015-10-29 DIAGNOSIS — E875 Hyperkalemia: Secondary | ICD-10-CM | POA: Diagnosis not present

## 2015-11-16 DIAGNOSIS — I8290 Acute embolism and thrombosis of unspecified vein: Secondary | ICD-10-CM | POA: Diagnosis not present

## 2015-11-16 DIAGNOSIS — I482 Chronic atrial fibrillation: Secondary | ICD-10-CM | POA: Diagnosis not present

## 2015-11-23 DIAGNOSIS — E875 Hyperkalemia: Secondary | ICD-10-CM | POA: Diagnosis not present

## 2015-11-25 DIAGNOSIS — D485 Neoplasm of uncertain behavior of skin: Secondary | ICD-10-CM | POA: Diagnosis not present

## 2015-11-25 DIAGNOSIS — Z85828 Personal history of other malignant neoplasm of skin: Secondary | ICD-10-CM | POA: Diagnosis not present

## 2015-11-25 DIAGNOSIS — D0439 Carcinoma in situ of skin of other parts of face: Secondary | ICD-10-CM | POA: Diagnosis not present

## 2015-11-25 DIAGNOSIS — L570 Actinic keratosis: Secondary | ICD-10-CM | POA: Diagnosis not present

## 2015-11-25 DIAGNOSIS — D044 Carcinoma in situ of skin of scalp and neck: Secondary | ICD-10-CM | POA: Diagnosis not present

## 2015-11-30 DIAGNOSIS — I8290 Acute embolism and thrombosis of unspecified vein: Secondary | ICD-10-CM | POA: Diagnosis not present

## 2015-12-01 DIAGNOSIS — E1342 Other specified diabetes mellitus with diabetic polyneuropathy: Secondary | ICD-10-CM | POA: Diagnosis not present

## 2015-12-01 DIAGNOSIS — L851 Acquired keratosis [keratoderma] palmaris et plantaris: Secondary | ICD-10-CM | POA: Diagnosis not present

## 2015-12-01 DIAGNOSIS — B351 Tinea unguium: Secondary | ICD-10-CM | POA: Diagnosis not present

## 2015-12-10 DIAGNOSIS — C4442 Squamous cell carcinoma of skin of scalp and neck: Secondary | ICD-10-CM | POA: Diagnosis not present

## 2015-12-10 DIAGNOSIS — C44329 Squamous cell carcinoma of skin of other parts of face: Secondary | ICD-10-CM | POA: Diagnosis not present

## 2015-12-22 DIAGNOSIS — Z23 Encounter for immunization: Secondary | ICD-10-CM | POA: Diagnosis not present

## 2015-12-28 DIAGNOSIS — I8290 Acute embolism and thrombosis of unspecified vein: Secondary | ICD-10-CM | POA: Diagnosis not present

## 2015-12-28 DIAGNOSIS — I482 Chronic atrial fibrillation: Secondary | ICD-10-CM | POA: Diagnosis not present

## 2016-01-25 DIAGNOSIS — I8290 Acute embolism and thrombosis of unspecified vein: Secondary | ICD-10-CM | POA: Diagnosis not present

## 2016-01-25 DIAGNOSIS — I482 Chronic atrial fibrillation: Secondary | ICD-10-CM | POA: Diagnosis not present

## 2016-02-03 DIAGNOSIS — C44329 Squamous cell carcinoma of skin of other parts of face: Secondary | ICD-10-CM | POA: Diagnosis not present

## 2016-02-03 DIAGNOSIS — Z85828 Personal history of other malignant neoplasm of skin: Secondary | ICD-10-CM | POA: Diagnosis not present

## 2016-02-03 DIAGNOSIS — L570 Actinic keratosis: Secondary | ICD-10-CM | POA: Diagnosis not present

## 2016-02-03 DIAGNOSIS — D485 Neoplasm of uncertain behavior of skin: Secondary | ICD-10-CM | POA: Diagnosis not present

## 2016-02-08 NOTE — Progress Notes (Signed)
Cardiology Office Note  Date: 02/09/2016   ID: Don Endo., DOB 07-24-1927, MRN [id number]  PCP: [name], MD  Primary Cardiologist: Rozann Lesches, MD   Chief Complaint  Patient presents with  . Atrial Fibrillation    History of Present Illness: Don Hall. is an 82 y.o. male last seen in May. He presents for a follow-up visit. He does not report any significant palpitations or chest pain. Reports compliance with his medications.  He continues on Coumadin with follow-up per Dr. Quillian Quince. He has not had any interval bleeding problems. I reviewed his ECG today which shows sinus rhythm with frequent PACs.  Past Medical History:  Diagnosis Date  . Arthritis   . Atherosclerosis of artery of extremity with ulceration ([place])    Treated at wound clinic in [place]  . CKD (chronic kidney disease) stage 3, GFR 30-59 ml/min   . Diverticulosis   . DVT (deep venous thrombosis) ([name])   . Essential hypertension, benign   . GERD (gastroesophageal reflux disease)   . Hemorrhage 01/2010   Spontaneous hemorrhage of right thigh with possible over anticoagulation  . History of DVT of lower extremity 2007   Recurrent X 4, last one 01/2010 (after spontaneous hemorrhage of thigh)  . History of skin cancer   . Hyperlipidemia   . Paroxysmal atrial fibrillation (HCC)   . Renal calculus, right   . Renal cyst, left   . Spigelian hernia   . Type 2 diabetes mellitus ([place])   . Varicose veins     Current Outpatient Prescriptions  Medication Sig Dispense Refill  . atorvastatin (LIPITOR) 10 MG tablet Take 10 mg by mouth daily.    . carboxymethylcellulose (REFRESH) 1 % ophthalmic solution Place 1 drop into both eyes daily as needed (dry eyes).     Marland Kitchen glipiZIDE (GLUCOTROL) 10 MG tablet 1/4 - 1/2 tab by mouth daily    . lisinopril-hydrochlorothiazide (PRINZIDE,ZESTORETIC) 20-12.5 MG per tablet Take 1 tablet by mouth every morning.     Marland Kitchen MISC NATURAL PRODUCTS PO Venastate - take one tab  by mouth daily    . Multiple Vitamins-Minerals (PRESERVISION AREDS 2 PO) Take 1 tablet by mouth 2 (two) times daily.     Marland Kitchen warfarin (COUMADIN) 5 MG tablet Take 1 tablet (5 mg total) by mouth every evening. (Patient taking differently: Take 5 mg by mouth every evening. MANAGED BY PMD) 90 tablet 3   No current facility-administered medications for this visit.    Allergies:  Patient has no known allergies.   Social History: The patient  reports that he quit smoking about 57 years ago. His smoking use included Cigarettes. He has a 14.00 pack-year smoking history. He has never used smokeless tobacco. He reports that he does not drink alcohol or use drugs.   ROS:  Please see the history of present illness. Otherwise, complete review of systems is positive for hearing loss.  All other systems are reviewed and negative.   Physical Exam: VS:  BP 129/69   Pulse 74   Ht 5\' 6"  (1.676 m)   Wt 200 lb 6.4 oz (90.9 kg)   SpO2 99%   BMI 32.35 kg/m , BMI Body mass index is 32.35 kg/m.  Wt Readings from Last 3 Encounters:  02/09/16 200 lb 6.4 oz (90.9 kg)  07/28/15 208 lb 6.4 oz (94.5 kg)  01/27/15 207 lb 6.4 oz (94.1 kg)    General: Patient appears comfortable at rest. HEENT: Conjunctiva and lids normal,  oropharynx clear with moist mucosa. Neck: Supple, no elevated JVP or carotid bruits, no thyromegaly. Lungs: Clear to auscultation, nonlabored breathing at rest. Cardiac: Regular rate and rhythm, no S3 or significant systolic murmur, no pericardial rub. Abdomen: Soft, nontender, no hepatomegaly, bowel sounds present, no guarding or rebound. Extremities: No pitting edema, distal pulses 2+.  ECG: I personally reviewed the tracing from 01/01/2015 which showed sinus rhythm with PAC, low voltage in the lateral leads, otherwise suggestive of LVH.  Recent Labwork:  March 2016: Hemoglobin 12.3, platelets 218, BUN 27 creatinine 1.7, potassium 4.4, AST 15, ALT 14, cholesterol 116, triglycerides 85, HDL 41,  LDL 58, hemoglobin A1c 7.1, TSH 1.4  Other Studies Reviewed Today:  Echocardiogram August 2015:  Mild LVH with LVEF 53-97%, grade 1 diastolic dysfunction, mild mitral regurgitation, trivial aortic regurgitation, moderate RV dilatation with normal contraction and prominent trabeculation toward the apex, mild tricuspid regurgitation with PASP 42 mm mercury.   Lexiscan Cardiolite August 2015:  No diagnostic ST segment changes with rare PACs and PVCs, inferolateral soft tissue attenuation without clear evidence of scar or ischemia, LVEF 67%.  Assessment and Plan:  1. Paroxysmal atrial fibrillation. Continues on Coumadin with follow-up per Dr. Quillian Quince. No significant palpitations. He is in sinus rhythm with frequent PACs today.  2. Essential hypertension, continues on Prinzide. Blood pressure is adequately controlled today.  Current medicines were reviewed with the patient today.   Orders Placed This Encounter  Procedures  . EKG 12-Lead    Disposition: Follow-up in 6 months.  Signed, Satira Sark, MD, [facility] 02/09/2016 2:36 PM    [facility] at [place], [place], [place] [address] Phone: [phone]; Fax: [phone]

## 2016-02-09 ENCOUNTER — Encounter: Payer: Self-pay | Admitting: Cardiology

## 2016-02-09 ENCOUNTER — Ambulatory Visit (INDEPENDENT_AMBULATORY_CARE_PROVIDER_SITE_OTHER): Payer: Medicare Other | Admitting: Cardiology

## 2016-02-09 VITALS — BP 129/69 | HR 74 | Ht 66.0 in | Wt 200.4 lb

## 2016-02-09 DIAGNOSIS — I10 Essential (primary) hypertension: Secondary | ICD-10-CM

## 2016-02-09 DIAGNOSIS — I480 Paroxysmal atrial fibrillation: Secondary | ICD-10-CM | POA: Diagnosis not present

## 2016-02-09 NOTE — Patient Instructions (Signed)

## 2016-02-11 DIAGNOSIS — C44329 Squamous cell carcinoma of skin of other parts of face: Secondary | ICD-10-CM | POA: Diagnosis not present

## 2016-02-11 DIAGNOSIS — K210 Gastro-esophageal reflux disease with esophagitis: Secondary | ICD-10-CM | POA: Diagnosis not present

## 2016-02-11 DIAGNOSIS — E875 Hyperkalemia: Secondary | ICD-10-CM | POA: Diagnosis not present

## 2016-02-11 DIAGNOSIS — E038 Other specified hypothyroidism: Secondary | ICD-10-CM | POA: Diagnosis not present

## 2016-02-11 DIAGNOSIS — E1122 Type 2 diabetes mellitus with diabetic chronic kidney disease: Secondary | ICD-10-CM | POA: Diagnosis not present

## 2016-02-11 DIAGNOSIS — E782 Mixed hyperlipidemia: Secondary | ICD-10-CM | POA: Diagnosis not present

## 2016-02-11 DIAGNOSIS — Z9189 Other specified personal risk factors, not elsewhere classified: Secondary | ICD-10-CM | POA: Diagnosis not present

## 2016-02-11 DIAGNOSIS — I10 Essential (primary) hypertension: Secondary | ICD-10-CM | POA: Diagnosis not present

## 2016-02-12 DIAGNOSIS — B351 Tinea unguium: Secondary | ICD-10-CM | POA: Diagnosis not present

## 2016-02-12 DIAGNOSIS — E1342 Other specified diabetes mellitus with diabetic polyneuropathy: Secondary | ICD-10-CM | POA: Diagnosis not present

## 2016-02-12 DIAGNOSIS — L851 Acquired keratosis [keratoderma] palmaris et plantaris: Secondary | ICD-10-CM | POA: Diagnosis not present

## 2016-02-15 DIAGNOSIS — I10 Essential (primary) hypertension: Secondary | ICD-10-CM | POA: Diagnosis not present

## 2016-02-15 DIAGNOSIS — Z6827 Body mass index (BMI) 27.0-27.9, adult: Secondary | ICD-10-CM | POA: Diagnosis not present

## 2016-02-15 DIAGNOSIS — E1122 Type 2 diabetes mellitus with diabetic chronic kidney disease: Secondary | ICD-10-CM | POA: Diagnosis not present

## 2016-02-15 DIAGNOSIS — D649 Anemia, unspecified: Secondary | ICD-10-CM | POA: Diagnosis not present

## 2016-02-15 DIAGNOSIS — E1142 Type 2 diabetes mellitus with diabetic polyneuropathy: Secondary | ICD-10-CM | POA: Diagnosis not present

## 2016-02-15 DIAGNOSIS — M353 Polymyalgia rheumatica: Secondary | ICD-10-CM | POA: Diagnosis not present

## 2016-02-15 DIAGNOSIS — E782 Mixed hyperlipidemia: Secondary | ICD-10-CM | POA: Diagnosis not present

## 2016-02-15 DIAGNOSIS — M4727 Other spondylosis with radiculopathy, lumbosacral region: Secondary | ICD-10-CM | POA: Diagnosis not present

## 2016-02-24 DIAGNOSIS — I482 Chronic atrial fibrillation: Secondary | ICD-10-CM | POA: Diagnosis not present

## 2016-02-24 DIAGNOSIS — I8290 Acute embolism and thrombosis of unspecified vein: Secondary | ICD-10-CM | POA: Diagnosis not present

## 2016-03-01 DIAGNOSIS — I8290 Acute embolism and thrombosis of unspecified vein: Secondary | ICD-10-CM | POA: Diagnosis not present

## 2016-03-08 DIAGNOSIS — I8290 Acute embolism and thrombosis of unspecified vein: Secondary | ICD-10-CM | POA: Diagnosis not present

## 2016-03-22 DIAGNOSIS — I482 Chronic atrial fibrillation: Secondary | ICD-10-CM | POA: Diagnosis not present

## 2016-03-22 DIAGNOSIS — I8290 Acute embolism and thrombosis of unspecified vein: Secondary | ICD-10-CM | POA: Diagnosis not present

## 2016-04-05 DIAGNOSIS — L570 Actinic keratosis: Secondary | ICD-10-CM | POA: Diagnosis not present

## 2016-04-12 DIAGNOSIS — I8290 Acute embolism and thrombosis of unspecified vein: Secondary | ICD-10-CM | POA: Diagnosis not present

## 2016-04-12 DIAGNOSIS — I482 Chronic atrial fibrillation: Secondary | ICD-10-CM | POA: Diagnosis not present

## 2016-04-19 DIAGNOSIS — E1122 Type 2 diabetes mellitus with diabetic chronic kidney disease: Secondary | ICD-10-CM | POA: Diagnosis not present

## 2016-04-19 DIAGNOSIS — Z823 Family history of stroke: Secondary | ICD-10-CM | POA: Diagnosis not present

## 2016-04-19 DIAGNOSIS — Z96653 Presence of artificial knee joint, bilateral: Secondary | ICD-10-CM | POA: Diagnosis not present

## 2016-04-19 DIAGNOSIS — Z7901 Long term (current) use of anticoagulants: Secondary | ICD-10-CM | POA: Diagnosis not present

## 2016-04-19 DIAGNOSIS — Z9049 Acquired absence of other specified parts of digestive tract: Secondary | ICD-10-CM | POA: Diagnosis not present

## 2016-04-19 DIAGNOSIS — I129 Hypertensive chronic kidney disease with stage 1 through stage 4 chronic kidney disease, or unspecified chronic kidney disease: Secondary | ICD-10-CM | POA: Diagnosis not present

## 2016-04-19 DIAGNOSIS — I480 Paroxysmal atrial fibrillation: Secondary | ICD-10-CM | POA: Diagnosis not present

## 2016-04-19 DIAGNOSIS — R29818 Other symptoms and signs involving the nervous system: Secondary | ICD-10-CM | POA: Diagnosis not present

## 2016-04-19 DIAGNOSIS — M79602 Pain in left arm: Secondary | ICD-10-CM | POA: Diagnosis not present

## 2016-04-19 DIAGNOSIS — E785 Hyperlipidemia, unspecified: Secondary | ICD-10-CM | POA: Diagnosis not present

## 2016-04-19 DIAGNOSIS — Z79899 Other long term (current) drug therapy: Secondary | ICD-10-CM | POA: Diagnosis not present

## 2016-04-19 DIAGNOSIS — I6789 Other cerebrovascular disease: Secondary | ICD-10-CM | POA: Diagnosis not present

## 2016-04-19 DIAGNOSIS — M1990 Unspecified osteoarthritis, unspecified site: Secondary | ICD-10-CM | POA: Diagnosis not present

## 2016-04-19 DIAGNOSIS — G459 Transient cerebral ischemic attack, unspecified: Secondary | ICD-10-CM | POA: Diagnosis not present

## 2016-04-19 DIAGNOSIS — D631 Anemia in chronic kidney disease: Secondary | ICD-10-CM | POA: Diagnosis not present

## 2016-04-19 DIAGNOSIS — N183 Chronic kidney disease, stage 3 (moderate): Secondary | ICD-10-CM | POA: Diagnosis not present

## 2016-04-19 DIAGNOSIS — Z86718 Personal history of other venous thrombosis and embolism: Secondary | ICD-10-CM | POA: Diagnosis not present

## 2016-04-19 DIAGNOSIS — F4489 Other dissociative and conversion disorders: Secondary | ICD-10-CM | POA: Diagnosis not present

## 2016-04-19 DIAGNOSIS — Z888 Allergy status to other drugs, medicaments and biological substances status: Secondary | ICD-10-CM | POA: Diagnosis not present

## 2016-04-19 DIAGNOSIS — I10 Essential (primary) hypertension: Secondary | ICD-10-CM | POA: Diagnosis not present

## 2016-04-19 DIAGNOSIS — K219 Gastro-esophageal reflux disease without esophagitis: Secondary | ICD-10-CM | POA: Diagnosis not present

## 2016-04-19 DIAGNOSIS — R531 Weakness: Secondary | ICD-10-CM | POA: Diagnosis not present

## 2016-04-19 DIAGNOSIS — H353190 Nonexudative age-related macular degeneration, unspecified eye, stage unspecified: Secondary | ICD-10-CM | POA: Diagnosis not present

## 2016-04-20 DIAGNOSIS — I6523 Occlusion and stenosis of bilateral carotid arteries: Secondary | ICD-10-CM | POA: Diagnosis not present

## 2016-04-20 DIAGNOSIS — G459 Transient cerebral ischemic attack, unspecified: Secondary | ICD-10-CM | POA: Diagnosis not present

## 2016-04-20 DIAGNOSIS — R531 Weakness: Secondary | ICD-10-CM | POA: Diagnosis not present

## 2016-04-22 DIAGNOSIS — B351 Tinea unguium: Secondary | ICD-10-CM | POA: Diagnosis not present

## 2016-04-22 DIAGNOSIS — Z8673 Personal history of transient ischemic attack (TIA), and cerebral infarction without residual deficits: Secondary | ICD-10-CM | POA: Diagnosis not present

## 2016-04-22 DIAGNOSIS — E1342 Other specified diabetes mellitus with diabetic polyneuropathy: Secondary | ICD-10-CM | POA: Diagnosis not present

## 2016-04-22 DIAGNOSIS — L851 Acquired keratosis [keratoderma] palmaris et plantaris: Secondary | ICD-10-CM | POA: Diagnosis not present

## 2016-04-22 DIAGNOSIS — E785 Hyperlipidemia, unspecified: Secondary | ICD-10-CM | POA: Diagnosis not present

## 2016-04-22 DIAGNOSIS — N183 Chronic kidney disease, stage 3 (moderate): Secondary | ICD-10-CM | POA: Diagnosis not present

## 2016-04-22 DIAGNOSIS — Z5181 Encounter for therapeutic drug level monitoring: Secondary | ICD-10-CM | POA: Diagnosis not present

## 2016-04-22 DIAGNOSIS — I129 Hypertensive chronic kidney disease with stage 1 through stage 4 chronic kidney disease, or unspecified chronic kidney disease: Secondary | ICD-10-CM | POA: Diagnosis not present

## 2016-04-22 DIAGNOSIS — M1991 Primary osteoarthritis, unspecified site: Secondary | ICD-10-CM | POA: Diagnosis not present

## 2016-04-22 DIAGNOSIS — H353190 Nonexudative age-related macular degeneration, unspecified eye, stage unspecified: Secondary | ICD-10-CM | POA: Diagnosis not present

## 2016-04-22 DIAGNOSIS — D631 Anemia in chronic kidney disease: Secondary | ICD-10-CM | POA: Diagnosis not present

## 2016-04-22 DIAGNOSIS — E1122 Type 2 diabetes mellitus with diabetic chronic kidney disease: Secondary | ICD-10-CM | POA: Diagnosis not present

## 2016-04-22 DIAGNOSIS — Z96653 Presence of artificial knee joint, bilateral: Secondary | ICD-10-CM | POA: Diagnosis not present

## 2016-04-22 DIAGNOSIS — I480 Paroxysmal atrial fibrillation: Secondary | ICD-10-CM | POA: Diagnosis not present

## 2016-04-26 DIAGNOSIS — D631 Anemia in chronic kidney disease: Secondary | ICD-10-CM | POA: Diagnosis not present

## 2016-04-26 DIAGNOSIS — I480 Paroxysmal atrial fibrillation: Secondary | ICD-10-CM | POA: Diagnosis not present

## 2016-04-26 DIAGNOSIS — E1122 Type 2 diabetes mellitus with diabetic chronic kidney disease: Secondary | ICD-10-CM | POA: Diagnosis not present

## 2016-04-26 DIAGNOSIS — N183 Chronic kidney disease, stage 3 (moderate): Secondary | ICD-10-CM | POA: Diagnosis not present

## 2016-04-26 DIAGNOSIS — I129 Hypertensive chronic kidney disease with stage 1 through stage 4 chronic kidney disease, or unspecified chronic kidney disease: Secondary | ICD-10-CM | POA: Diagnosis not present

## 2016-04-26 DIAGNOSIS — Z8673 Personal history of transient ischemic attack (TIA), and cerebral infarction without residual deficits: Secondary | ICD-10-CM | POA: Diagnosis not present

## 2016-04-28 DIAGNOSIS — D631 Anemia in chronic kidney disease: Secondary | ICD-10-CM | POA: Diagnosis not present

## 2016-04-28 DIAGNOSIS — I129 Hypertensive chronic kidney disease with stage 1 through stage 4 chronic kidney disease, or unspecified chronic kidney disease: Secondary | ICD-10-CM | POA: Diagnosis not present

## 2016-04-28 DIAGNOSIS — I480 Paroxysmal atrial fibrillation: Secondary | ICD-10-CM | POA: Diagnosis not present

## 2016-04-28 DIAGNOSIS — N183 Chronic kidney disease, stage 3 (moderate): Secondary | ICD-10-CM | POA: Diagnosis not present

## 2016-04-28 DIAGNOSIS — E1122 Type 2 diabetes mellitus with diabetic chronic kidney disease: Secondary | ICD-10-CM | POA: Diagnosis not present

## 2016-04-28 DIAGNOSIS — Z8673 Personal history of transient ischemic attack (TIA), and cerebral infarction without residual deficits: Secondary | ICD-10-CM | POA: Diagnosis not present

## 2016-05-02 DIAGNOSIS — I480 Paroxysmal atrial fibrillation: Secondary | ICD-10-CM | POA: Diagnosis not present

## 2016-05-02 DIAGNOSIS — Z8673 Personal history of transient ischemic attack (TIA), and cerebral infarction without residual deficits: Secondary | ICD-10-CM | POA: Diagnosis not present

## 2016-05-02 DIAGNOSIS — D631 Anemia in chronic kidney disease: Secondary | ICD-10-CM | POA: Diagnosis not present

## 2016-05-02 DIAGNOSIS — I129 Hypertensive chronic kidney disease with stage 1 through stage 4 chronic kidney disease, or unspecified chronic kidney disease: Secondary | ICD-10-CM | POA: Diagnosis not present

## 2016-05-02 DIAGNOSIS — N183 Chronic kidney disease, stage 3 (moderate): Secondary | ICD-10-CM | POA: Diagnosis not present

## 2016-05-02 DIAGNOSIS — E1122 Type 2 diabetes mellitus with diabetic chronic kidney disease: Secondary | ICD-10-CM | POA: Diagnosis not present

## 2016-05-03 ENCOUNTER — Encounter: Payer: Self-pay | Admitting: Internal Medicine

## 2016-05-05 DIAGNOSIS — E1122 Type 2 diabetes mellitus with diabetic chronic kidney disease: Secondary | ICD-10-CM | POA: Diagnosis not present

## 2016-05-05 DIAGNOSIS — I129 Hypertensive chronic kidney disease with stage 1 through stage 4 chronic kidney disease, or unspecified chronic kidney disease: Secondary | ICD-10-CM | POA: Diagnosis not present

## 2016-05-05 DIAGNOSIS — Z8673 Personal history of transient ischemic attack (TIA), and cerebral infarction without residual deficits: Secondary | ICD-10-CM | POA: Diagnosis not present

## 2016-05-05 DIAGNOSIS — I480 Paroxysmal atrial fibrillation: Secondary | ICD-10-CM | POA: Diagnosis not present

## 2016-05-05 DIAGNOSIS — D631 Anemia in chronic kidney disease: Secondary | ICD-10-CM | POA: Diagnosis not present

## 2016-05-05 DIAGNOSIS — N183 Chronic kidney disease, stage 3 (moderate): Secondary | ICD-10-CM | POA: Diagnosis not present

## 2016-05-11 DIAGNOSIS — I482 Chronic atrial fibrillation: Secondary | ICD-10-CM | POA: Diagnosis not present

## 2016-05-11 DIAGNOSIS — Z8673 Personal history of transient ischemic attack (TIA), and cerebral infarction without residual deficits: Secondary | ICD-10-CM | POA: Diagnosis not present

## 2016-05-11 DIAGNOSIS — I480 Paroxysmal atrial fibrillation: Secondary | ICD-10-CM | POA: Diagnosis not present

## 2016-05-11 DIAGNOSIS — I129 Hypertensive chronic kidney disease with stage 1 through stage 4 chronic kidney disease, or unspecified chronic kidney disease: Secondary | ICD-10-CM | POA: Diagnosis not present

## 2016-05-11 DIAGNOSIS — I8290 Acute embolism and thrombosis of unspecified vein: Secondary | ICD-10-CM | POA: Diagnosis not present

## 2016-05-11 DIAGNOSIS — D631 Anemia in chronic kidney disease: Secondary | ICD-10-CM | POA: Diagnosis not present

## 2016-05-11 DIAGNOSIS — N183 Chronic kidney disease, stage 3 (moderate): Secondary | ICD-10-CM | POA: Diagnosis not present

## 2016-05-11 DIAGNOSIS — E1122 Type 2 diabetes mellitus with diabetic chronic kidney disease: Secondary | ICD-10-CM | POA: Diagnosis not present

## 2016-05-17 DIAGNOSIS — N183 Chronic kidney disease, stage 3 (moderate): Secondary | ICD-10-CM | POA: Diagnosis not present

## 2016-05-17 DIAGNOSIS — I129 Hypertensive chronic kidney disease with stage 1 through stage 4 chronic kidney disease, or unspecified chronic kidney disease: Secondary | ICD-10-CM | POA: Diagnosis not present

## 2016-05-17 DIAGNOSIS — Z8673 Personal history of transient ischemic attack (TIA), and cerebral infarction without residual deficits: Secondary | ICD-10-CM | POA: Diagnosis not present

## 2016-05-17 DIAGNOSIS — E1122 Type 2 diabetes mellitus with diabetic chronic kidney disease: Secondary | ICD-10-CM | POA: Diagnosis not present

## 2016-05-17 DIAGNOSIS — I480 Paroxysmal atrial fibrillation: Secondary | ICD-10-CM | POA: Diagnosis not present

## 2016-05-17 DIAGNOSIS — D631 Anemia in chronic kidney disease: Secondary | ICD-10-CM | POA: Diagnosis not present

## 2016-05-20 DIAGNOSIS — E782 Mixed hyperlipidemia: Secondary | ICD-10-CM | POA: Diagnosis not present

## 2016-05-20 DIAGNOSIS — G459 Transient cerebral ischemic attack, unspecified: Secondary | ICD-10-CM | POA: Diagnosis not present

## 2016-05-23 DIAGNOSIS — D631 Anemia in chronic kidney disease: Secondary | ICD-10-CM | POA: Diagnosis not present

## 2016-05-23 DIAGNOSIS — E1122 Type 2 diabetes mellitus with diabetic chronic kidney disease: Secondary | ICD-10-CM | POA: Diagnosis not present

## 2016-05-23 DIAGNOSIS — I129 Hypertensive chronic kidney disease with stage 1 through stage 4 chronic kidney disease, or unspecified chronic kidney disease: Secondary | ICD-10-CM | POA: Diagnosis not present

## 2016-05-23 DIAGNOSIS — N183 Chronic kidney disease, stage 3 (moderate): Secondary | ICD-10-CM | POA: Diagnosis not present

## 2016-05-23 DIAGNOSIS — Z8673 Personal history of transient ischemic attack (TIA), and cerebral infarction without residual deficits: Secondary | ICD-10-CM | POA: Diagnosis not present

## 2016-05-23 DIAGNOSIS — I480 Paroxysmal atrial fibrillation: Secondary | ICD-10-CM | POA: Diagnosis not present

## 2016-06-08 DIAGNOSIS — I482 Chronic atrial fibrillation: Secondary | ICD-10-CM | POA: Diagnosis not present

## 2016-06-08 DIAGNOSIS — I8290 Acute embolism and thrombosis of unspecified vein: Secondary | ICD-10-CM | POA: Diagnosis not present

## 2016-06-08 DIAGNOSIS — L570 Actinic keratosis: Secondary | ICD-10-CM | POA: Diagnosis not present

## 2016-06-08 DIAGNOSIS — Z85828 Personal history of other malignant neoplasm of skin: Secondary | ICD-10-CM | POA: Diagnosis not present

## 2016-06-16 DIAGNOSIS — K210 Gastro-esophageal reflux disease with esophagitis: Secondary | ICD-10-CM | POA: Diagnosis not present

## 2016-06-16 DIAGNOSIS — N183 Chronic kidney disease, stage 3 (moderate): Secondary | ICD-10-CM | POA: Diagnosis not present

## 2016-06-16 DIAGNOSIS — I482 Chronic atrial fibrillation: Secondary | ICD-10-CM | POA: Diagnosis not present

## 2016-06-16 DIAGNOSIS — D649 Anemia, unspecified: Secondary | ICD-10-CM | POA: Diagnosis not present

## 2016-06-16 DIAGNOSIS — I10 Essential (primary) hypertension: Secondary | ICD-10-CM | POA: Diagnosis not present

## 2016-06-16 DIAGNOSIS — E1122 Type 2 diabetes mellitus with diabetic chronic kidney disease: Secondary | ICD-10-CM | POA: Diagnosis not present

## 2016-06-16 DIAGNOSIS — E1165 Type 2 diabetes mellitus with hyperglycemia: Secondary | ICD-10-CM | POA: Diagnosis not present

## 2016-06-16 DIAGNOSIS — E782 Mixed hyperlipidemia: Secondary | ICD-10-CM | POA: Diagnosis not present

## 2016-06-16 DIAGNOSIS — I8290 Acute embolism and thrombosis of unspecified vein: Secondary | ICD-10-CM | POA: Diagnosis not present

## 2016-06-16 DIAGNOSIS — E875 Hyperkalemia: Secondary | ICD-10-CM | POA: Diagnosis not present

## 2016-06-16 DIAGNOSIS — E1142 Type 2 diabetes mellitus with diabetic polyneuropathy: Secondary | ICD-10-CM | POA: Diagnosis not present

## 2016-06-20 DIAGNOSIS — D649 Anemia, unspecified: Secondary | ICD-10-CM | POA: Diagnosis not present

## 2016-06-20 DIAGNOSIS — I482 Chronic atrial fibrillation: Secondary | ICD-10-CM | POA: Diagnosis not present

## 2016-06-20 DIAGNOSIS — M353 Polymyalgia rheumatica: Secondary | ICD-10-CM | POA: Diagnosis not present

## 2016-06-20 DIAGNOSIS — E1142 Type 2 diabetes mellitus with diabetic polyneuropathy: Secondary | ICD-10-CM | POA: Diagnosis not present

## 2016-06-20 DIAGNOSIS — G6289 Other specified polyneuropathies: Secondary | ICD-10-CM | POA: Diagnosis not present

## 2016-06-20 DIAGNOSIS — I10 Essential (primary) hypertension: Secondary | ICD-10-CM | POA: Diagnosis not present

## 2016-06-20 DIAGNOSIS — E782 Mixed hyperlipidemia: Secondary | ICD-10-CM | POA: Diagnosis not present

## 2016-06-20 DIAGNOSIS — E1122 Type 2 diabetes mellitus with diabetic chronic kidney disease: Secondary | ICD-10-CM | POA: Diagnosis not present

## 2016-07-01 DIAGNOSIS — B351 Tinea unguium: Secondary | ICD-10-CM | POA: Diagnosis not present

## 2016-07-01 DIAGNOSIS — E1342 Other specified diabetes mellitus with diabetic polyneuropathy: Secondary | ICD-10-CM | POA: Diagnosis not present

## 2016-07-01 DIAGNOSIS — L851 Acquired keratosis [keratoderma] palmaris et plantaris: Secondary | ICD-10-CM | POA: Diagnosis not present

## 2016-07-06 DIAGNOSIS — I482 Chronic atrial fibrillation: Secondary | ICD-10-CM | POA: Diagnosis not present

## 2016-08-01 DIAGNOSIS — I8290 Acute embolism and thrombosis of unspecified vein: Secondary | ICD-10-CM | POA: Diagnosis not present

## 2016-08-01 DIAGNOSIS — I482 Chronic atrial fibrillation: Secondary | ICD-10-CM | POA: Diagnosis not present

## 2016-09-01 DIAGNOSIS — I482 Chronic atrial fibrillation: Secondary | ICD-10-CM | POA: Diagnosis not present

## 2016-09-09 DIAGNOSIS — L851 Acquired keratosis [keratoderma] palmaris et plantaris: Secondary | ICD-10-CM | POA: Diagnosis not present

## 2016-09-09 DIAGNOSIS — B351 Tinea unguium: Secondary | ICD-10-CM | POA: Diagnosis not present

## 2016-09-09 DIAGNOSIS — E1342 Other specified diabetes mellitus with diabetic polyneuropathy: Secondary | ICD-10-CM | POA: Diagnosis not present

## 2016-09-12 DIAGNOSIS — D485 Neoplasm of uncertain behavior of skin: Secondary | ICD-10-CM | POA: Diagnosis not present

## 2016-09-12 DIAGNOSIS — Z85828 Personal history of other malignant neoplasm of skin: Secondary | ICD-10-CM | POA: Diagnosis not present

## 2016-09-12 DIAGNOSIS — L570 Actinic keratosis: Secondary | ICD-10-CM | POA: Diagnosis not present

## 2016-09-12 DIAGNOSIS — C44329 Squamous cell carcinoma of skin of other parts of face: Secondary | ICD-10-CM | POA: Diagnosis not present

## 2016-09-22 DIAGNOSIS — C44329 Squamous cell carcinoma of skin of other parts of face: Secondary | ICD-10-CM | POA: Diagnosis not present

## 2016-09-29 DIAGNOSIS — I8290 Acute embolism and thrombosis of unspecified vein: Secondary | ICD-10-CM | POA: Diagnosis not present

## 2016-09-29 DIAGNOSIS — I482 Chronic atrial fibrillation: Secondary | ICD-10-CM | POA: Diagnosis not present

## 2016-10-04 DIAGNOSIS — I482 Chronic atrial fibrillation: Secondary | ICD-10-CM | POA: Diagnosis not present

## 2016-10-12 DIAGNOSIS — D649 Anemia, unspecified: Secondary | ICD-10-CM | POA: Diagnosis not present

## 2016-10-12 DIAGNOSIS — Z9189 Other specified personal risk factors, not elsewhere classified: Secondary | ICD-10-CM | POA: Diagnosis not present

## 2016-10-12 DIAGNOSIS — E1122 Type 2 diabetes mellitus with diabetic chronic kidney disease: Secondary | ICD-10-CM | POA: Diagnosis not present

## 2016-10-12 DIAGNOSIS — E782 Mixed hyperlipidemia: Secondary | ICD-10-CM | POA: Diagnosis not present

## 2016-10-12 DIAGNOSIS — E875 Hyperkalemia: Secondary | ICD-10-CM | POA: Diagnosis not present

## 2016-10-12 DIAGNOSIS — K210 Gastro-esophageal reflux disease with esophagitis: Secondary | ICD-10-CM | POA: Diagnosis not present

## 2016-10-12 DIAGNOSIS — I10 Essential (primary) hypertension: Secondary | ICD-10-CM | POA: Diagnosis not present

## 2016-10-17 DIAGNOSIS — E875 Hyperkalemia: Secondary | ICD-10-CM | POA: Diagnosis not present

## 2016-10-17 DIAGNOSIS — I482 Chronic atrial fibrillation: Secondary | ICD-10-CM | POA: Diagnosis not present

## 2016-10-17 DIAGNOSIS — E1142 Type 2 diabetes mellitus with diabetic polyneuropathy: Secondary | ICD-10-CM | POA: Diagnosis not present

## 2016-10-17 DIAGNOSIS — I10 Essential (primary) hypertension: Secondary | ICD-10-CM | POA: Diagnosis not present

## 2016-10-17 DIAGNOSIS — E1122 Type 2 diabetes mellitus with diabetic chronic kidney disease: Secondary | ICD-10-CM | POA: Diagnosis not present

## 2016-10-17 DIAGNOSIS — M353 Polymyalgia rheumatica: Secondary | ICD-10-CM | POA: Diagnosis not present

## 2016-10-17 DIAGNOSIS — D649 Anemia, unspecified: Secondary | ICD-10-CM | POA: Diagnosis not present

## 2016-10-17 DIAGNOSIS — D692 Other nonthrombocytopenic purpura: Secondary | ICD-10-CM | POA: Diagnosis not present

## 2016-10-20 DIAGNOSIS — C44629 Squamous cell carcinoma of skin of left upper limb, including shoulder: Secondary | ICD-10-CM | POA: Diagnosis not present

## 2016-10-20 DIAGNOSIS — D485 Neoplasm of uncertain behavior of skin: Secondary | ICD-10-CM | POA: Diagnosis not present

## 2016-10-24 DIAGNOSIS — C44629 Squamous cell carcinoma of skin of left upper limb, including shoulder: Secondary | ICD-10-CM | POA: Diagnosis not present

## 2016-10-27 DIAGNOSIS — I482 Chronic atrial fibrillation: Secondary | ICD-10-CM | POA: Diagnosis not present

## 2016-11-10 DIAGNOSIS — I482 Chronic atrial fibrillation: Secondary | ICD-10-CM | POA: Diagnosis not present

## 2016-11-10 DIAGNOSIS — A0472 Enterocolitis due to Clostridium difficile, not specified as recurrent: Secondary | ICD-10-CM | POA: Diagnosis not present

## 2016-11-10 DIAGNOSIS — Z23 Encounter for immunization: Secondary | ICD-10-CM | POA: Diagnosis not present

## 2016-12-02 DIAGNOSIS — E1342 Other specified diabetes mellitus with diabetic polyneuropathy: Secondary | ICD-10-CM | POA: Diagnosis not present

## 2016-12-02 DIAGNOSIS — B351 Tinea unguium: Secondary | ICD-10-CM | POA: Diagnosis not present

## 2016-12-02 DIAGNOSIS — L851 Acquired keratosis [keratoderma] palmaris et plantaris: Secondary | ICD-10-CM | POA: Diagnosis not present

## 2016-12-12 DIAGNOSIS — I482 Chronic atrial fibrillation: Secondary | ICD-10-CM | POA: Diagnosis not present

## 2016-12-12 DIAGNOSIS — I8290 Acute embolism and thrombosis of unspecified vein: Secondary | ICD-10-CM | POA: Diagnosis not present

## 2016-12-22 DIAGNOSIS — M9903 Segmental and somatic dysfunction of lumbar region: Secondary | ICD-10-CM | POA: Diagnosis not present

## 2016-12-22 DIAGNOSIS — M47816 Spondylosis without myelopathy or radiculopathy, lumbar region: Secondary | ICD-10-CM | POA: Diagnosis not present

## 2016-12-22 DIAGNOSIS — M5441 Lumbago with sciatica, right side: Secondary | ICD-10-CM | POA: Diagnosis not present

## 2016-12-26 DIAGNOSIS — M5441 Lumbago with sciatica, right side: Secondary | ICD-10-CM | POA: Diagnosis not present

## 2016-12-26 DIAGNOSIS — R600 Localized edema: Secondary | ICD-10-CM | POA: Diagnosis not present

## 2016-12-26 DIAGNOSIS — M9903 Segmental and somatic dysfunction of lumbar region: Secondary | ICD-10-CM | POA: Diagnosis not present

## 2016-12-26 DIAGNOSIS — Z6830 Body mass index (BMI) 30.0-30.9, adult: Secondary | ICD-10-CM | POA: Diagnosis not present

## 2016-12-26 DIAGNOSIS — M47816 Spondylosis without myelopathy or radiculopathy, lumbar region: Secondary | ICD-10-CM | POA: Diagnosis not present

## 2016-12-28 DIAGNOSIS — M47816 Spondylosis without myelopathy or radiculopathy, lumbar region: Secondary | ICD-10-CM | POA: Diagnosis not present

## 2016-12-28 DIAGNOSIS — M9903 Segmental and somatic dysfunction of lumbar region: Secondary | ICD-10-CM | POA: Diagnosis not present

## 2016-12-28 DIAGNOSIS — M5441 Lumbago with sciatica, right side: Secondary | ICD-10-CM | POA: Diagnosis not present
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
## 2016-12-30 ENCOUNTER — Encounter: Payer: Self-pay | Admitting: Cardiology

## 2016-12-30 ENCOUNTER — Ambulatory Visit (INDEPENDENT_AMBULATORY_CARE_PROVIDER_SITE_OTHER): Payer: Medicare Other | Admitting: Cardiology

## 2016-12-30 VITALS — BP 140/62 | HR 71 | Ht 66.0 in | Wt 217.2 lb

## 2016-12-30 DIAGNOSIS — M47816 Spondylosis without myelopathy or radiculopathy, lumbar region: Secondary | ICD-10-CM | POA: Diagnosis not present

## 2016-12-30 DIAGNOSIS — I480 Paroxysmal atrial fibrillation: Secondary | ICD-10-CM | POA: Diagnosis not present

## 2016-12-30 DIAGNOSIS — M9903 Segmental and somatic dysfunction of lumbar region: Secondary | ICD-10-CM | POA: Diagnosis not present

## 2016-12-30 DIAGNOSIS — I10 Essential (primary) hypertension: Secondary | ICD-10-CM | POA: Diagnosis not present

## 2016-12-30 DIAGNOSIS — M5441 Lumbago with sciatica, right side: Secondary | ICD-10-CM | POA: Diagnosis not present

## 2016-12-30 NOTE — Patient Instructions (Signed)

## 2017-01-02 DIAGNOSIS — M9903 Segmental and somatic dysfunction of lumbar region: Secondary | ICD-10-CM | POA: Diagnosis not present

## 2017-01-02 DIAGNOSIS — M5441 Lumbago with sciatica, right side: Secondary | ICD-10-CM | POA: Diagnosis not present

## 2017-01-02 DIAGNOSIS — M47816 Spondylosis without myelopathy or radiculopathy, lumbar region: Secondary | ICD-10-CM | POA: Diagnosis not present

## 2017-01-04 DIAGNOSIS — M9903 Segmental and somatic dysfunction of lumbar region: Secondary | ICD-10-CM | POA: Diagnosis not present

## 2017-01-04 DIAGNOSIS — M5441 Lumbago with sciatica, right side: Secondary | ICD-10-CM | POA: Diagnosis not present

## 2017-01-04 DIAGNOSIS — M722 Plantar fascial fibromatosis: Secondary | ICD-10-CM | POA: Diagnosis not present

## 2017-01-04 DIAGNOSIS — M47816 Spondylosis without myelopathy or radiculopathy, lumbar region: Secondary | ICD-10-CM | POA: Diagnosis not present

## 2017-01-04 DIAGNOSIS — M79671 Pain in right foot: Secondary | ICD-10-CM | POA: Diagnosis not present

## 2017-01-05 DIAGNOSIS — X58XXXA Exposure to other specified factors, initial encounter: Secondary | ICD-10-CM | POA: Diagnosis not present

## 2017-01-05 DIAGNOSIS — D62 Acute posthemorrhagic anemia: Secondary | ICD-10-CM | POA: Diagnosis not present

## 2017-01-05 DIAGNOSIS — R229 Localized swelling, mass and lump, unspecified: Secondary | ICD-10-CM | POA: Diagnosis not present

## 2017-01-05 DIAGNOSIS — I482 Chronic atrial fibrillation: Secondary | ICD-10-CM | POA: Diagnosis not present

## 2017-01-05 DIAGNOSIS — R52 Pain, unspecified: Secondary | ICD-10-CM | POA: Diagnosis not present

## 2017-01-05 DIAGNOSIS — S80822A Blister (nonthermal), left lower leg, initial encounter: Secondary | ICD-10-CM | POA: Diagnosis not present

## 2017-01-05 DIAGNOSIS — M7989 Other specified soft tissue disorders: Secondary | ICD-10-CM | POA: Diagnosis not present

## 2017-01-05 DIAGNOSIS — S8012XA Contusion of left lower leg, initial encounter: Secondary | ICD-10-CM | POA: Diagnosis not present

## 2017-01-05 DIAGNOSIS — D649 Anemia, unspecified: Secondary | ICD-10-CM | POA: Diagnosis not present

## 2017-01-05 DIAGNOSIS — S8992XA Unspecified injury of left lower leg, initial encounter: Secondary | ICD-10-CM | POA: Diagnosis not present

## 2017-01-05 DIAGNOSIS — N179 Acute kidney failure, unspecified: Secondary | ICD-10-CM | POA: Diagnosis not present

## 2017-01-05 DIAGNOSIS — T148XXA Other injury of unspecified body region, initial encounter: Secondary | ICD-10-CM | POA: Diagnosis not present

## 2017-01-05 DIAGNOSIS — Z7901 Long term (current) use of anticoagulants: Secondary | ICD-10-CM | POA: Diagnosis not present

## 2017-01-05 DIAGNOSIS — K56600 Partial intestinal obstruction, unspecified as to cause: Secondary | ICD-10-CM | POA: Diagnosis not present

## 2017-01-06 DIAGNOSIS — E785 Hyperlipidemia, unspecified: Secondary | ICD-10-CM | POA: Diagnosis present

## 2017-01-06 DIAGNOSIS — Z833 Family history of diabetes mellitus: Secondary | ICD-10-CM | POA: Diagnosis not present

## 2017-01-06 DIAGNOSIS — J9811 Atelectasis: Secondary | ICD-10-CM | POA: Diagnosis not present

## 2017-01-06 DIAGNOSIS — E119 Type 2 diabetes mellitus without complications: Secondary | ICD-10-CM | POA: Diagnosis not present

## 2017-01-06 DIAGNOSIS — Z809 Family history of malignant neoplasm, unspecified: Secondary | ICD-10-CM | POA: Diagnosis not present

## 2017-01-06 DIAGNOSIS — E7800 Pure hypercholesterolemia, unspecified: Secondary | ICD-10-CM | POA: Diagnosis present

## 2017-01-06 DIAGNOSIS — Z7901 Long term (current) use of anticoagulants: Secondary | ICD-10-CM | POA: Diagnosis not present

## 2017-01-06 DIAGNOSIS — H3530 Unspecified macular degeneration: Secondary | ICD-10-CM | POA: Diagnosis not present

## 2017-01-06 DIAGNOSIS — Z8673 Personal history of transient ischemic attack (TIA), and cerebral infarction without residual deficits: Secondary | ICD-10-CM | POA: Diagnosis not present

## 2017-01-06 DIAGNOSIS — I129 Hypertensive chronic kidney disease with stage 1 through stage 4 chronic kidney disease, or unspecified chronic kidney disease: Secondary | ICD-10-CM | POA: Diagnosis present

## 2017-01-06 DIAGNOSIS — D62 Acute posthemorrhagic anemia: Secondary | ICD-10-CM | POA: Diagnosis not present

## 2017-01-06 DIAGNOSIS — R279 Unspecified lack of coordination: Secondary | ICD-10-CM | POA: Diagnosis not present

## 2017-01-06 DIAGNOSIS — X58XXXD Exposure to other specified factors, subsequent encounter: Secondary | ICD-10-CM | POA: Diagnosis not present

## 2017-01-06 DIAGNOSIS — Z87891 Personal history of nicotine dependence: Secondary | ICD-10-CM | POA: Diagnosis not present

## 2017-01-06 DIAGNOSIS — D638 Anemia in other chronic diseases classified elsewhere: Secondary | ICD-10-CM | POA: Diagnosis present

## 2017-01-06 DIAGNOSIS — I482 Chronic atrial fibrillation: Secondary | ICD-10-CM | POA: Diagnosis present

## 2017-01-06 DIAGNOSIS — Z86718 Personal history of other venous thrombosis and embolism: Secondary | ICD-10-CM | POA: Diagnosis not present

## 2017-01-06 DIAGNOSIS — S80822A Blister (nonthermal), left lower leg, initial encounter: Secondary | ICD-10-CM | POA: Diagnosis present

## 2017-01-06 DIAGNOSIS — R1031 Right lower quadrant pain: Secondary | ICD-10-CM | POA: Diagnosis not present

## 2017-01-06 DIAGNOSIS — N179 Acute kidney failure, unspecified: Secondary | ICD-10-CM | POA: Diagnosis present

## 2017-01-06 DIAGNOSIS — R404 Transient alteration of awareness: Secondary | ICD-10-CM | POA: Diagnosis not present

## 2017-01-06 DIAGNOSIS — R531 Weakness: Secondary | ICD-10-CM | POA: Diagnosis not present

## 2017-01-06 DIAGNOSIS — I10 Essential (primary) hypertension: Secondary | ICD-10-CM | POA: Diagnosis not present

## 2017-01-06 DIAGNOSIS — R278 Other lack of coordination: Secondary | ICD-10-CM | POA: Diagnosis not present

## 2017-01-06 DIAGNOSIS — Z8249 Family history of ischemic heart disease and other diseases of the circulatory system: Secondary | ICD-10-CM | POA: Diagnosis not present

## 2017-01-06 DIAGNOSIS — Z7401 Bed confinement status: Secondary | ICD-10-CM | POA: Diagnosis not present

## 2017-01-06 DIAGNOSIS — R2689 Other abnormalities of gait and mobility: Secondary | ICD-10-CM | POA: Diagnosis not present

## 2017-01-06 DIAGNOSIS — Z79899 Other long term (current) drug therapy: Secondary | ICD-10-CM | POA: Diagnosis not present

## 2017-01-06 DIAGNOSIS — K219 Gastro-esophageal reflux disease without esophagitis: Secondary | ICD-10-CM | POA: Diagnosis present

## 2017-01-06 DIAGNOSIS — K56600 Partial intestinal obstruction, unspecified as to cause: Secondary | ICD-10-CM | POA: Diagnosis not present

## 2017-01-06 DIAGNOSIS — S8012XD Contusion of left lower leg, subsequent encounter: Secondary | ICD-10-CM | POA: Diagnosis not present

## 2017-01-06 DIAGNOSIS — D500 Iron deficiency anemia secondary to blood loss (chronic): Secondary | ICD-10-CM | POA: Diagnosis not present

## 2017-01-06 DIAGNOSIS — N183 Chronic kidney disease, stage 3 (moderate): Secondary | ICD-10-CM | POA: Diagnosis present

## 2017-01-06 DIAGNOSIS — R1110 Vomiting, unspecified: Secondary | ICD-10-CM | POA: Diagnosis not present

## 2017-01-06 DIAGNOSIS — N189 Chronic kidney disease, unspecified: Secondary | ICD-10-CM | POA: Diagnosis not present

## 2017-01-06 DIAGNOSIS — Z7982 Long term (current) use of aspirin: Secondary | ICD-10-CM | POA: Diagnosis not present

## 2017-01-06 DIAGNOSIS — K56609 Unspecified intestinal obstruction, unspecified as to partial versus complete obstruction: Secondary | ICD-10-CM | POA: Diagnosis not present

## 2017-01-06 DIAGNOSIS — R112 Nausea with vomiting, unspecified: Secondary | ICD-10-CM | POA: Diagnosis not present

## 2017-01-06 DIAGNOSIS — E1165 Type 2 diabetes mellitus with hyperglycemia: Secondary | ICD-10-CM | POA: Diagnosis present

## 2017-01-06 DIAGNOSIS — Z4682 Encounter for fitting and adjustment of non-vascular catheter: Secondary | ICD-10-CM | POA: Diagnosis not present

## 2017-01-06 DIAGNOSIS — E1122 Type 2 diabetes mellitus with diabetic chronic kidney disease: Secondary | ICD-10-CM | POA: Diagnosis present

## 2017-01-06 DIAGNOSIS — T148XXA Other injury of unspecified body region, initial encounter: Secondary | ICD-10-CM | POA: Diagnosis not present

## 2017-01-06 DIAGNOSIS — M6281 Muscle weakness (generalized): Secondary | ICD-10-CM | POA: Diagnosis not present

## 2017-01-06 DIAGNOSIS — S8012XA Contusion of left lower leg, initial encounter: Secondary | ICD-10-CM | POA: Diagnosis present

## 2017-01-06 DIAGNOSIS — R1032 Left lower quadrant pain: Secondary | ICD-10-CM | POA: Diagnosis not present

## 2017-01-06 DIAGNOSIS — K567 Ileus, unspecified: Secondary | ICD-10-CM | POA: Diagnosis not present

## 2017-01-11 DIAGNOSIS — E1065 Type 1 diabetes mellitus with hyperglycemia: Secondary | ICD-10-CM | POA: Diagnosis not present

## 2017-01-11 DIAGNOSIS — R9431 Abnormal electrocardiogram [ECG] [EKG]: Secondary | ICD-10-CM | POA: Diagnosis not present

## 2017-01-11 DIAGNOSIS — R531 Weakness: Secondary | ICD-10-CM | POA: Diagnosis not present

## 2017-01-11 DIAGNOSIS — Z7401 Bed confinement status: Secondary | ICD-10-CM | POA: Diagnosis not present

## 2017-01-11 DIAGNOSIS — R3129 Other microscopic hematuria: Secondary | ICD-10-CM | POA: Diagnosis not present

## 2017-01-11 DIAGNOSIS — X58XXXD Exposure to other specified factors, subsequent encounter: Secondary | ICD-10-CM | POA: Diagnosis not present

## 2017-01-11 DIAGNOSIS — E119 Type 2 diabetes mellitus without complications: Secondary | ICD-10-CM | POA: Diagnosis not present

## 2017-01-11 DIAGNOSIS — T148XXA Other injury of unspecified body region, initial encounter: Secondary | ICD-10-CM | POA: Diagnosis not present

## 2017-01-11 DIAGNOSIS — S8012XD Contusion of left lower leg, subsequent encounter: Secondary | ICD-10-CM | POA: Diagnosis not present

## 2017-01-11 DIAGNOSIS — L03116 Cellulitis of left lower limb: Secondary | ICD-10-CM | POA: Diagnosis present

## 2017-01-11 DIAGNOSIS — M1990 Unspecified osteoarthritis, unspecified site: Secondary | ICD-10-CM | POA: Diagnosis present

## 2017-01-11 DIAGNOSIS — T788XXA Other adverse effects, not elsewhere classified, initial encounter: Secondary | ICD-10-CM | POA: Diagnosis not present

## 2017-01-11 DIAGNOSIS — Z809 Family history of malignant neoplasm, unspecified: Secondary | ICD-10-CM | POA: Diagnosis not present

## 2017-01-11 DIAGNOSIS — E1165 Type 2 diabetes mellitus with hyperglycemia: Secondary | ICD-10-CM | POA: Diagnosis not present

## 2017-01-11 DIAGNOSIS — R112 Nausea with vomiting, unspecified: Secondary | ICD-10-CM | POA: Diagnosis not present

## 2017-01-11 DIAGNOSIS — H3530 Unspecified macular degeneration: Secondary | ICD-10-CM | POA: Diagnosis not present

## 2017-01-11 DIAGNOSIS — I10 Essential (primary) hypertension: Secondary | ICD-10-CM | POA: Diagnosis not present

## 2017-01-11 DIAGNOSIS — J9811 Atelectasis: Secondary | ICD-10-CM | POA: Diagnosis not present

## 2017-01-11 DIAGNOSIS — Z79899 Other long term (current) drug therapy: Secondary | ICD-10-CM | POA: Diagnosis not present

## 2017-01-11 DIAGNOSIS — D638 Anemia in other chronic diseases classified elsewhere: Secondary | ICD-10-CM | POA: Diagnosis present

## 2017-01-11 DIAGNOSIS — S8992XA Unspecified injury of left lower leg, initial encounter: Secondary | ICD-10-CM | POA: Diagnosis not present

## 2017-01-11 DIAGNOSIS — D500 Iron deficiency anemia secondary to blood loss (chronic): Secondary | ICD-10-CM | POA: Diagnosis not present

## 2017-01-11 DIAGNOSIS — Z7902 Long term (current) use of antithrombotics/antiplatelets: Secondary | ICD-10-CM | POA: Diagnosis not present

## 2017-01-11 DIAGNOSIS — Z86718 Personal history of other venous thrombosis and embolism: Secondary | ICD-10-CM | POA: Diagnosis not present

## 2017-01-11 DIAGNOSIS — Z833 Family history of diabetes mellitus: Secondary | ICD-10-CM | POA: Diagnosis not present

## 2017-01-11 DIAGNOSIS — Z743 Need for continuous supervision: Secondary | ICD-10-CM | POA: Diagnosis not present

## 2017-01-11 DIAGNOSIS — N183 Chronic kidney disease, stage 3 (moderate): Secondary | ICD-10-CM | POA: Diagnosis present

## 2017-01-11 DIAGNOSIS — R1110 Vomiting, unspecified: Secondary | ICD-10-CM | POA: Diagnosis not present

## 2017-01-11 DIAGNOSIS — E7800 Pure hypercholesterolemia, unspecified: Secondary | ICD-10-CM | POA: Diagnosis present

## 2017-01-11 DIAGNOSIS — T50905A Adverse effect of unspecified drugs, medicaments and biological substances, initial encounter: Secondary | ICD-10-CM | POA: Diagnosis not present

## 2017-01-11 DIAGNOSIS — S81812A Laceration without foreign body, left lower leg, initial encounter: Secondary | ICD-10-CM | POA: Diagnosis not present

## 2017-01-11 DIAGNOSIS — I4891 Unspecified atrial fibrillation: Secondary | ICD-10-CM | POA: Diagnosis not present

## 2017-01-11 DIAGNOSIS — R2689 Other abnormalities of gait and mobility: Secondary | ICD-10-CM | POA: Diagnosis not present

## 2017-01-11 DIAGNOSIS — I482 Chronic atrial fibrillation: Secondary | ICD-10-CM | POA: Diagnosis present

## 2017-01-11 DIAGNOSIS — I4510 Unspecified right bundle-branch block: Secondary | ICD-10-CM | POA: Diagnosis not present

## 2017-01-11 DIAGNOSIS — M6281 Muscle weakness (generalized): Secondary | ICD-10-CM | POA: Diagnosis not present

## 2017-01-11 DIAGNOSIS — D62 Acute posthemorrhagic anemia: Secondary | ICD-10-CM | POA: Diagnosis present

## 2017-01-11 DIAGNOSIS — R279 Unspecified lack of coordination: Secondary | ICD-10-CM | POA: Diagnosis not present

## 2017-01-11 DIAGNOSIS — R1032 Left lower quadrant pain: Secondary | ICD-10-CM | POA: Diagnosis not present

## 2017-01-11 DIAGNOSIS — R278 Other lack of coordination: Secondary | ICD-10-CM | POA: Diagnosis not present

## 2017-01-11 DIAGNOSIS — E785 Hyperlipidemia, unspecified: Secondary | ICD-10-CM | POA: Diagnosis present

## 2017-01-11 DIAGNOSIS — Z8673 Personal history of transient ischemic attack (TIA), and cerebral infarction without residual deficits: Secondary | ICD-10-CM | POA: Diagnosis not present

## 2017-01-11 DIAGNOSIS — K219 Gastro-esophageal reflux disease without esophagitis: Secondary | ICD-10-CM | POA: Diagnosis present

## 2017-01-11 DIAGNOSIS — I129 Hypertensive chronic kidney disease with stage 1 through stage 4 chronic kidney disease, or unspecified chronic kidney disease: Secondary | ICD-10-CM | POA: Diagnosis present

## 2017-01-11 DIAGNOSIS — N179 Acute kidney failure, unspecified: Secondary | ICD-10-CM | POA: Diagnosis present

## 2017-01-11 DIAGNOSIS — Z7901 Long term (current) use of anticoagulants: Secondary | ICD-10-CM | POA: Diagnosis not present

## 2017-01-11 DIAGNOSIS — S8012XA Contusion of left lower leg, initial encounter: Secondary | ICD-10-CM | POA: Diagnosis present

## 2017-01-11 DIAGNOSIS — R404 Transient alteration of awareness: Secondary | ICD-10-CM | POA: Diagnosis not present

## 2017-01-11 DIAGNOSIS — Z87891 Personal history of nicotine dependence: Secondary | ICD-10-CM | POA: Diagnosis not present

## 2017-01-11 DIAGNOSIS — K567 Ileus, unspecified: Secondary | ICD-10-CM | POA: Diagnosis not present

## 2017-01-11 DIAGNOSIS — Z8249 Family history of ischemic heart disease and other diseases of the circulatory system: Secondary | ICD-10-CM | POA: Diagnosis not present

## 2017-01-11 DIAGNOSIS — S81802A Unspecified open wound, left lower leg, initial encounter: Secondary | ICD-10-CM | POA: Diagnosis not present

## 2017-01-11 DIAGNOSIS — E1122 Type 2 diabetes mellitus with diabetic chronic kidney disease: Secondary | ICD-10-CM | POA: Diagnosis present

## 2017-01-12 DIAGNOSIS — K219 Gastro-esophageal reflux disease without esophagitis: Secondary | ICD-10-CM | POA: Diagnosis not present

## 2017-01-12 DIAGNOSIS — Z7901 Long term (current) use of anticoagulants: Secondary | ICD-10-CM | POA: Diagnosis not present

## 2017-01-12 DIAGNOSIS — I4891 Unspecified atrial fibrillation: Secondary | ICD-10-CM | POA: Diagnosis not present

## 2017-01-12 DIAGNOSIS — S8012XA Contusion of left lower leg, initial encounter: Secondary | ICD-10-CM | POA: Diagnosis not present

## 2017-01-12 DIAGNOSIS — I10 Essential (primary) hypertension: Secondary | ICD-10-CM | POA: Diagnosis not present

## 2017-01-12 DIAGNOSIS — E7800 Pure hypercholesterolemia, unspecified: Secondary | ICD-10-CM | POA: Diagnosis not present

## 2017-01-14 DIAGNOSIS — D500 Iron deficiency anemia secondary to blood loss (chronic): Secondary | ICD-10-CM | POA: Diagnosis not present

## 2017-01-20 DIAGNOSIS — E1165 Type 2 diabetes mellitus with hyperglycemia: Secondary | ICD-10-CM | POA: Diagnosis not present

## 2017-01-20 DIAGNOSIS — J9811 Atelectasis: Secondary | ICD-10-CM | POA: Diagnosis not present

## 2017-01-20 DIAGNOSIS — R9431 Abnormal electrocardiogram [ECG] [EKG]: Secondary | ICD-10-CM | POA: Diagnosis not present

## 2017-01-20 DIAGNOSIS — T788XXA Other adverse effects, not elsewhere classified, initial encounter: Secondary | ICD-10-CM | POA: Diagnosis not present

## 2017-01-20 DIAGNOSIS — T50905A Adverse effect of unspecified drugs, medicaments and biological substances, initial encounter: Secondary | ICD-10-CM | POA: Diagnosis not present

## 2017-01-20 DIAGNOSIS — L03116 Cellulitis of left lower limb: Secondary | ICD-10-CM | POA: Diagnosis not present

## 2017-01-20 DIAGNOSIS — R3129 Other microscopic hematuria: Secondary | ICD-10-CM | POA: Diagnosis not present

## 2017-01-20 DIAGNOSIS — S81812A Laceration without foreign body, left lower leg, initial encounter: Secondary | ICD-10-CM | POA: Diagnosis not present

## 2017-01-20 DIAGNOSIS — S8012XA Contusion of left lower leg, initial encounter: Secondary | ICD-10-CM | POA: Diagnosis not present

## 2017-01-21 DIAGNOSIS — D500 Iron deficiency anemia secondary to blood loss (chronic): Secondary | ICD-10-CM | POA: Diagnosis not present

## 2017-01-24 DIAGNOSIS — D500 Iron deficiency anemia secondary to blood loss (chronic): Secondary | ICD-10-CM | POA: Diagnosis not present

## 2017-01-25 DIAGNOSIS — E7800 Pure hypercholesterolemia, unspecified: Secondary | ICD-10-CM | POA: Diagnosis present

## 2017-01-25 DIAGNOSIS — M1990 Unspecified osteoarthritis, unspecified site: Secondary | ICD-10-CM | POA: Diagnosis present

## 2017-01-25 DIAGNOSIS — D638 Anemia in other chronic diseases classified elsewhere: Secondary | ICD-10-CM | POA: Diagnosis not present

## 2017-01-25 DIAGNOSIS — I129 Hypertensive chronic kidney disease with stage 1 through stage 4 chronic kidney disease, or unspecified chronic kidney disease: Secondary | ICD-10-CM | POA: Diagnosis not present

## 2017-01-25 DIAGNOSIS — Z7401 Bed confinement status: Secondary | ICD-10-CM | POA: Diagnosis not present

## 2017-01-25 DIAGNOSIS — E611 Iron deficiency: Secondary | ICD-10-CM | POA: Diagnosis not present

## 2017-01-25 DIAGNOSIS — Z809 Family history of malignant neoplasm, unspecified: Secondary | ICD-10-CM | POA: Diagnosis not present

## 2017-01-25 DIAGNOSIS — D500 Iron deficiency anemia secondary to blood loss (chronic): Secondary | ICD-10-CM | POA: Diagnosis not present

## 2017-01-25 DIAGNOSIS — E785 Hyperlipidemia, unspecified: Secondary | ICD-10-CM | POA: Diagnosis present

## 2017-01-25 DIAGNOSIS — E1122 Type 2 diabetes mellitus with diabetic chronic kidney disease: Secondary | ICD-10-CM | POA: Diagnosis present

## 2017-01-25 DIAGNOSIS — Z87891 Personal history of nicotine dependence: Secondary | ICD-10-CM | POA: Diagnosis not present

## 2017-01-25 DIAGNOSIS — R2689 Other abnormalities of gait and mobility: Secondary | ICD-10-CM | POA: Diagnosis not present

## 2017-01-25 DIAGNOSIS — K219 Gastro-esophageal reflux disease without esophagitis: Secondary | ICD-10-CM | POA: Diagnosis present

## 2017-01-25 DIAGNOSIS — S81802A Unspecified open wound, left lower leg, initial encounter: Secondary | ICD-10-CM | POA: Diagnosis not present

## 2017-01-25 DIAGNOSIS — X58XXXD Exposure to other specified factors, subsequent encounter: Secondary | ICD-10-CM | POA: Diagnosis not present

## 2017-01-25 DIAGNOSIS — Z79899 Other long term (current) drug therapy: Secondary | ICD-10-CM | POA: Diagnosis not present

## 2017-01-25 DIAGNOSIS — D62 Acute posthemorrhagic anemia: Secondary | ICD-10-CM | POA: Diagnosis not present

## 2017-01-25 DIAGNOSIS — N179 Acute kidney failure, unspecified: Secondary | ICD-10-CM | POA: Diagnosis not present

## 2017-01-25 DIAGNOSIS — Z8673 Personal history of transient ischemic attack (TIA), and cerebral infarction without residual deficits: Secondary | ICD-10-CM | POA: Diagnosis not present

## 2017-01-25 DIAGNOSIS — Z7902 Long term (current) use of antithrombotics/antiplatelets: Secondary | ICD-10-CM | POA: Diagnosis not present

## 2017-01-25 DIAGNOSIS — R279 Unspecified lack of coordination: Secondary | ICD-10-CM | POA: Diagnosis not present

## 2017-01-25 DIAGNOSIS — R278 Other lack of coordination: Secondary | ICD-10-CM | POA: Diagnosis not present

## 2017-01-25 DIAGNOSIS — Z86718 Personal history of other venous thrombosis and embolism: Secondary | ICD-10-CM | POA: Diagnosis not present

## 2017-01-25 DIAGNOSIS — I482 Chronic atrial fibrillation: Secondary | ICD-10-CM | POA: Diagnosis present

## 2017-01-25 DIAGNOSIS — Z833 Family history of diabetes mellitus: Secondary | ICD-10-CM | POA: Diagnosis not present

## 2017-01-25 DIAGNOSIS — S8012XD Contusion of left lower leg, subsequent encounter: Secondary | ICD-10-CM | POA: Diagnosis not present

## 2017-01-25 DIAGNOSIS — S8012XA Contusion of left lower leg, initial encounter: Secondary | ICD-10-CM | POA: Diagnosis not present

## 2017-01-25 DIAGNOSIS — L03116 Cellulitis of left lower limb: Secondary | ICD-10-CM | POA: Diagnosis present

## 2017-01-25 DIAGNOSIS — Z8249 Family history of ischemic heart disease and other diseases of the circulatory system: Secondary | ICD-10-CM | POA: Diagnosis not present

## 2017-01-25 DIAGNOSIS — T148XXA Other injury of unspecified body region, initial encounter: Secondary | ICD-10-CM | POA: Diagnosis not present

## 2017-01-25 DIAGNOSIS — N183 Chronic kidney disease, stage 3 (moderate): Secondary | ICD-10-CM | POA: Diagnosis present

## 2017-01-25 DIAGNOSIS — M6281 Muscle weakness (generalized): Secondary | ICD-10-CM | POA: Diagnosis not present

## 2017-02-01 DIAGNOSIS — S8012XS Contusion of left lower leg, sequela: Secondary | ICD-10-CM | POA: Diagnosis not present

## 2017-02-01 DIAGNOSIS — Z7401 Bed confinement status: Secondary | ICD-10-CM | POA: Diagnosis not present

## 2017-02-01 DIAGNOSIS — N183 Chronic kidney disease, stage 3 (moderate): Secondary | ICD-10-CM | POA: Diagnosis not present

## 2017-02-01 DIAGNOSIS — R2689 Other abnormalities of gait and mobility: Secondary | ICD-10-CM | POA: Diagnosis not present

## 2017-02-01 DIAGNOSIS — Z86718 Personal history of other venous thrombosis and embolism: Secondary | ICD-10-CM | POA: Diagnosis not present

## 2017-02-01 DIAGNOSIS — E785 Hyperlipidemia, unspecified: Secondary | ICD-10-CM | POA: Diagnosis not present

## 2017-02-01 DIAGNOSIS — K219 Gastro-esophageal reflux disease without esophagitis: Secondary | ICD-10-CM | POA: Diagnosis not present

## 2017-02-01 DIAGNOSIS — M6281 Muscle weakness (generalized): Secondary | ICD-10-CM | POA: Diagnosis not present

## 2017-02-01 DIAGNOSIS — E119 Type 2 diabetes mellitus without complications: Secondary | ICD-10-CM | POA: Diagnosis not present

## 2017-02-01 DIAGNOSIS — S8012XD Contusion of left lower leg, subsequent encounter: Secondary | ICD-10-CM | POA: Diagnosis not present

## 2017-02-01 DIAGNOSIS — X58XXXD Exposure to other specified factors, subsequent encounter: Secondary | ICD-10-CM | POA: Diagnosis not present

## 2017-02-01 DIAGNOSIS — E1122 Type 2 diabetes mellitus with diabetic chronic kidney disease: Secondary | ICD-10-CM | POA: Diagnosis not present

## 2017-02-01 DIAGNOSIS — M1990 Unspecified osteoarthritis, unspecified site: Secondary | ICD-10-CM | POA: Diagnosis not present

## 2017-02-01 DIAGNOSIS — Z79899 Other long term (current) drug therapy: Secondary | ICD-10-CM | POA: Diagnosis not present

## 2017-02-01 DIAGNOSIS — I4891 Unspecified atrial fibrillation: Secondary | ICD-10-CM | POA: Diagnosis not present

## 2017-02-01 DIAGNOSIS — L97829 Non-pressure chronic ulcer of other part of left lower leg with unspecified severity: Secondary | ICD-10-CM | POA: Diagnosis not present

## 2017-02-01 DIAGNOSIS — Z6828 Body mass index (BMI) 28.0-28.9, adult: Secondary | ICD-10-CM | POA: Diagnosis not present

## 2017-02-01 DIAGNOSIS — Z888 Allergy status to other drugs, medicaments and biological substances status: Secondary | ICD-10-CM | POA: Diagnosis not present

## 2017-02-01 DIAGNOSIS — R279 Unspecified lack of coordination: Secondary | ICD-10-CM | POA: Diagnosis not present

## 2017-02-01 DIAGNOSIS — N179 Acute kidney failure, unspecified: Secondary | ICD-10-CM | POA: Diagnosis not present

## 2017-02-01 DIAGNOSIS — D500 Iron deficiency anemia secondary to blood loss (chronic): Secondary | ICD-10-CM | POA: Diagnosis not present

## 2017-02-01 DIAGNOSIS — F329 Major depressive disorder, single episode, unspecified: Secondary | ICD-10-CM | POA: Diagnosis not present

## 2017-02-01 DIAGNOSIS — I482 Chronic atrial fibrillation: Secondary | ICD-10-CM | POA: Diagnosis not present

## 2017-02-01 DIAGNOSIS — T148XXA Other injury of unspecified body region, initial encounter: Secondary | ICD-10-CM | POA: Diagnosis not present

## 2017-02-01 DIAGNOSIS — S81802A Unspecified open wound, left lower leg, initial encounter: Secondary | ICD-10-CM | POA: Diagnosis not present

## 2017-02-01 DIAGNOSIS — R278 Other lack of coordination: Secondary | ICD-10-CM | POA: Diagnosis not present

## 2017-02-01 DIAGNOSIS — S8012XA Contusion of left lower leg, initial encounter: Secondary | ICD-10-CM | POA: Diagnosis not present

## 2017-02-01 DIAGNOSIS — Z8673 Personal history of transient ischemic attack (TIA), and cerebral infarction without residual deficits: Secondary | ICD-10-CM | POA: Diagnosis not present

## 2017-02-01 DIAGNOSIS — L03116 Cellulitis of left lower limb: Secondary | ICD-10-CM | POA: Diagnosis not present

## 2017-02-01 DIAGNOSIS — S81802D Unspecified open wound, left lower leg, subsequent encounter: Secondary | ICD-10-CM | POA: Diagnosis not present

## 2017-02-01 DIAGNOSIS — E611 Iron deficiency: Secondary | ICD-10-CM | POA: Diagnosis not present

## 2017-02-01 DIAGNOSIS — I129 Hypertensive chronic kidney disease with stage 1 through stage 4 chronic kidney disease, or unspecified chronic kidney disease: Secondary | ICD-10-CM | POA: Diagnosis not present

## 2017-02-01 DIAGNOSIS — I951 Orthostatic hypotension: Secondary | ICD-10-CM | POA: Diagnosis not present

## 2017-02-05 DIAGNOSIS — D500 Iron deficiency anemia secondary to blood loss (chronic): Secondary | ICD-10-CM | POA: Diagnosis not present

## 2017-02-05 DIAGNOSIS — T148XXA Other injury of unspecified body region, initial encounter: Secondary | ICD-10-CM | POA: Diagnosis not present

## 2017-02-05 DIAGNOSIS — L03116 Cellulitis of left lower limb: Secondary | ICD-10-CM | POA: Diagnosis not present

## 2017-02-15 DIAGNOSIS — S8012XA Contusion of left lower leg, initial encounter: Secondary | ICD-10-CM | POA: Diagnosis not present

## 2017-02-15 DIAGNOSIS — Z79899 Other long term (current) drug therapy: Secondary | ICD-10-CM | POA: Diagnosis not present

## 2017-02-15 DIAGNOSIS — S8012XD Contusion of left lower leg, subsequent encounter: Secondary | ICD-10-CM | POA: Diagnosis not present

## 2017-02-18 DIAGNOSIS — T148XXA Other injury of unspecified body region, initial encounter: Secondary | ICD-10-CM | POA: Diagnosis not present

## 2017-02-18 DIAGNOSIS — D500 Iron deficiency anemia secondary to blood loss (chronic): Secondary | ICD-10-CM | POA: Diagnosis not present

## 2017-02-18 DIAGNOSIS — L03116 Cellulitis of left lower limb: Secondary | ICD-10-CM | POA: Diagnosis not present

## 2017-02-28 DIAGNOSIS — L97829 Non-pressure chronic ulcer of other part of left lower leg with unspecified severity: Secondary | ICD-10-CM | POA: Diagnosis not present

## 2017-02-28 DIAGNOSIS — N183 Chronic kidney disease, stage 3 (moderate): Secondary | ICD-10-CM | POA: Diagnosis not present

## 2017-02-28 DIAGNOSIS — M6281 Muscle weakness (generalized): Secondary | ICD-10-CM | POA: Diagnosis not present

## 2017-02-28 DIAGNOSIS — R2689 Other abnormalities of gait and mobility: Secondary | ICD-10-CM | POA: Diagnosis not present

## 2017-02-28 DIAGNOSIS — Z888 Allergy status to other drugs, medicaments and biological substances status: Secondary | ICD-10-CM | POA: Diagnosis not present

## 2017-02-28 DIAGNOSIS — Z86718 Personal history of other venous thrombosis and embolism: Secondary | ICD-10-CM | POA: Diagnosis not present

## 2017-02-28 DIAGNOSIS — I951 Orthostatic hypotension: Secondary | ICD-10-CM | POA: Diagnosis not present

## 2017-02-28 DIAGNOSIS — L03116 Cellulitis of left lower limb: Secondary | ICD-10-CM | POA: Diagnosis not present

## 2017-02-28 DIAGNOSIS — N179 Acute kidney failure, unspecified: Secondary | ICD-10-CM | POA: Diagnosis not present

## 2017-02-28 DIAGNOSIS — I482 Chronic atrial fibrillation: Secondary | ICD-10-CM | POA: Diagnosis not present

## 2017-02-28 DIAGNOSIS — X58XXXD Exposure to other specified factors, subsequent encounter: Secondary | ICD-10-CM | POA: Diagnosis not present

## 2017-02-28 DIAGNOSIS — M1990 Unspecified osteoarthritis, unspecified site: Secondary | ICD-10-CM | POA: Diagnosis not present

## 2017-02-28 DIAGNOSIS — Z6828 Body mass index (BMI) 28.0-28.9, adult: Secondary | ICD-10-CM | POA: Diagnosis not present

## 2017-02-28 DIAGNOSIS — E119 Type 2 diabetes mellitus without complications: Secondary | ICD-10-CM | POA: Diagnosis not present

## 2017-02-28 DIAGNOSIS — E611 Iron deficiency: Secondary | ICD-10-CM | POA: Diagnosis not present

## 2017-02-28 DIAGNOSIS — D500 Iron deficiency anemia secondary to blood loss (chronic): Secondary | ICD-10-CM | POA: Diagnosis not present

## 2017-02-28 DIAGNOSIS — E1122 Type 2 diabetes mellitus with diabetic chronic kidney disease: Secondary | ICD-10-CM | POA: Diagnosis not present

## 2017-02-28 DIAGNOSIS — S8012XS Contusion of left lower leg, sequela: Secondary | ICD-10-CM | POA: Diagnosis not present

## 2017-02-28 DIAGNOSIS — Z8673 Personal history of transient ischemic attack (TIA), and cerebral infarction without residual deficits: Secondary | ICD-10-CM | POA: Diagnosis not present

## 2017-02-28 DIAGNOSIS — S8012XD Contusion of left lower leg, subsequent encounter: Secondary | ICD-10-CM | POA: Diagnosis not present

## 2017-02-28 DIAGNOSIS — F329 Major depressive disorder, single episode, unspecified: Secondary | ICD-10-CM | POA: Diagnosis not present

## 2017-02-28 DIAGNOSIS — I129 Hypertensive chronic kidney disease with stage 1 through stage 4 chronic kidney disease, or unspecified chronic kidney disease: Secondary | ICD-10-CM | POA: Diagnosis not present

## 2017-02-28 DIAGNOSIS — S81802D Unspecified open wound, left lower leg, subsequent encounter: Secondary | ICD-10-CM | POA: Diagnosis not present

## 2017-02-28 DIAGNOSIS — K219 Gastro-esophageal reflux disease without esophagitis: Secondary | ICD-10-CM | POA: Diagnosis not present

## 2017-02-28 DIAGNOSIS — R278 Other lack of coordination: Secondary | ICD-10-CM | POA: Diagnosis not present

## 2017-02-28 DIAGNOSIS — I4891 Unspecified atrial fibrillation: Secondary | ICD-10-CM | POA: Diagnosis not present

## 2017-02-28 DIAGNOSIS — E785 Hyperlipidemia, unspecified: Secondary | ICD-10-CM | POA: Diagnosis not present

## 2017-02-28 DIAGNOSIS — S81802A Unspecified open wound, left lower leg, initial encounter: Secondary | ICD-10-CM | POA: Diagnosis not present

## 2017-03-03 DIAGNOSIS — L03116 Cellulitis of left lower limb: Secondary | ICD-10-CM | POA: Diagnosis not present

## 2017-03-03 DIAGNOSIS — I951 Orthostatic hypotension: Secondary | ICD-10-CM | POA: Diagnosis not present

## 2017-03-03 DIAGNOSIS — D500 Iron deficiency anemia secondary to blood loss (chronic): Secondary | ICD-10-CM | POA: Diagnosis not present

## 2017-03-15 DIAGNOSIS — L97829 Non-pressure chronic ulcer of other part of left lower leg with unspecified severity: Secondary | ICD-10-CM | POA: Diagnosis not present

## 2017-03-15 DIAGNOSIS — S8012XD Contusion of left lower leg, subsequent encounter: Secondary | ICD-10-CM | POA: Diagnosis not present

## 2017-03-15 DIAGNOSIS — S8012XS Contusion of left lower leg, sequela: Secondary | ICD-10-CM | POA: Diagnosis not present

## 2017-03-19 DIAGNOSIS — L03116 Cellulitis of left lower limb: Secondary | ICD-10-CM | POA: Diagnosis not present

## 2017-03-19 DIAGNOSIS — I951 Orthostatic hypotension: Secondary | ICD-10-CM | POA: Diagnosis not present

## 2017-03-19 DIAGNOSIS — D500 Iron deficiency anemia secondary to blood loss (chronic): Secondary | ICD-10-CM | POA: Diagnosis not present

## 2017-03-21 DIAGNOSIS — S81802D Unspecified open wound, left lower leg, subsequent encounter: Secondary | ICD-10-CM | POA: Diagnosis not present

## 2017-03-21 DIAGNOSIS — Z6828 Body mass index (BMI) 28.0-28.9, adult: Secondary | ICD-10-CM | POA: Diagnosis not present

## 2017-03-23 DIAGNOSIS — E119 Type 2 diabetes mellitus without complications: Secondary | ICD-10-CM | POA: Diagnosis not present

## 2017-03-23 DIAGNOSIS — K219 Gastro-esophageal reflux disease without esophagitis: Secondary | ICD-10-CM | POA: Diagnosis not present

## 2017-03-23 DIAGNOSIS — I4891 Unspecified atrial fibrillation: Secondary | ICD-10-CM | POA: Diagnosis not present

## 2017-03-23 DIAGNOSIS — S81802A Unspecified open wound, left lower leg, initial encounter: Secondary | ICD-10-CM | POA: Diagnosis not present

## 2017-03-23 DIAGNOSIS — E785 Hyperlipidemia, unspecified: Secondary | ICD-10-CM | POA: Diagnosis not present

## 2017-03-23 DIAGNOSIS — Z8673 Personal history of transient ischemic attack (TIA), and cerebral infarction without residual deficits: Secondary | ICD-10-CM | POA: Diagnosis not present

## 2017-03-24 DIAGNOSIS — K219 Gastro-esophageal reflux disease without esophagitis: Secondary | ICD-10-CM | POA: Diagnosis not present

## 2017-03-24 DIAGNOSIS — Z8673 Personal history of transient ischemic attack (TIA), and cerebral infarction without residual deficits: Secondary | ICD-10-CM | POA: Diagnosis not present

## 2017-03-24 DIAGNOSIS — I4891 Unspecified atrial fibrillation: Secondary | ICD-10-CM | POA: Diagnosis not present

## 2017-03-24 DIAGNOSIS — E119 Type 2 diabetes mellitus without complications: Secondary | ICD-10-CM | POA: Diagnosis not present

## 2017-03-24 DIAGNOSIS — E785 Hyperlipidemia, unspecified: Secondary | ICD-10-CM | POA: Diagnosis not present

## 2017-03-24 DIAGNOSIS — S81802A Unspecified open wound, left lower leg, initial encounter: Secondary | ICD-10-CM | POA: Diagnosis not present

## 2017-04-23 ENCOUNTER — Emergency Department (HOSPITAL_COMMUNITY): Payer: Medicare Other

## 2017-04-23 ENCOUNTER — Encounter (HOSPITAL_COMMUNITY): Payer: Self-pay | Admitting: Emergency Medicine

## 2017-04-23 ENCOUNTER — Inpatient Hospital Stay (HOSPITAL_COMMUNITY)
Admission: EM | Admit: 2017-04-23 | Discharge: 2017-04-27 | DRG: 871 | Disposition: A | Discharge status: Home Health | Payer: Medicare Other | Attending: Internal Medicine | Admitting: Internal Medicine

## 2017-04-23 DIAGNOSIS — K219 Gastro-esophageal reflux disease without esophagitis: Secondary | ICD-10-CM | POA: Diagnosis present

## 2017-04-23 DIAGNOSIS — Z85828 Personal history of other malignant neoplasm of skin: Secondary | ICD-10-CM | POA: Diagnosis not present

## 2017-04-23 DIAGNOSIS — L97409 Non-pressure chronic ulcer of unspecified heel and midfoot with unspecified severity: Secondary | ICD-10-CM | POA: Diagnosis not present

## 2017-04-23 DIAGNOSIS — D509 Iron deficiency anemia, unspecified: Secondary | ICD-10-CM | POA: Diagnosis present

## 2017-04-23 DIAGNOSIS — L97909 Non-pressure chronic ulcer of unspecified part of unspecified lower leg with unspecified severity: Secondary | ICD-10-CM | POA: Diagnosis present

## 2017-04-23 DIAGNOSIS — R6521 Severe sepsis with septic shock: Secondary | ICD-10-CM | POA: Diagnosis present

## 2017-04-23 DIAGNOSIS — N390 Urinary tract infection, site not specified: Secondary | ICD-10-CM | POA: Diagnosis present

## 2017-04-23 DIAGNOSIS — I480 Paroxysmal atrial fibrillation: Secondary | ICD-10-CM | POA: Diagnosis not present

## 2017-04-23 DIAGNOSIS — L89610 Pressure ulcer of right heel, unstageable: Secondary | ICD-10-CM | POA: Diagnosis present

## 2017-04-23 DIAGNOSIS — Z87891 Personal history of nicotine dependence: Secondary | ICD-10-CM

## 2017-04-23 DIAGNOSIS — E1151 Type 2 diabetes mellitus with diabetic peripheral angiopathy without gangrene: Secondary | ICD-10-CM | POA: Diagnosis present

## 2017-04-23 DIAGNOSIS — R4020 Unspecified coma: Secondary | ICD-10-CM

## 2017-04-23 DIAGNOSIS — Z9049 Acquired absence of other specified parts of digestive tract: Secondary | ICD-10-CM

## 2017-04-23 DIAGNOSIS — Z86718 Personal history of other venous thrombosis and embolism: Secondary | ICD-10-CM

## 2017-04-23 DIAGNOSIS — D638 Anemia in other chronic diseases classified elsewhere: Secondary | ICD-10-CM

## 2017-04-23 DIAGNOSIS — L97419 Non-pressure chronic ulcer of right heel and midfoot with unspecified severity: Secondary | ICD-10-CM | POA: Diagnosis not present

## 2017-04-23 DIAGNOSIS — R55 Syncope and collapse: Secondary | ICD-10-CM

## 2017-04-23 DIAGNOSIS — E785 Hyperlipidemia, unspecified: Secondary | ICD-10-CM | POA: Diagnosis present

## 2017-04-23 DIAGNOSIS — Z96653 Presence of artificial knee joint, bilateral: Secondary | ICD-10-CM | POA: Diagnosis present

## 2017-04-23 DIAGNOSIS — L03115 Cellulitis of right lower limb: Secondary | ICD-10-CM | POA: Diagnosis not present

## 2017-04-23 DIAGNOSIS — I951 Orthostatic hypotension: Secondary | ICD-10-CM

## 2017-04-23 DIAGNOSIS — A419 Sepsis, unspecified organism: Principal | ICD-10-CM

## 2017-04-23 DIAGNOSIS — R253 Fasciculation: Secondary | ICD-10-CM | POA: Diagnosis not present

## 2017-04-23 DIAGNOSIS — G253 Myoclonus: Secondary | ICD-10-CM | POA: Diagnosis present

## 2017-04-23 DIAGNOSIS — I83022 Varicose veins of left lower extremity with ulcer of calf: Secondary | ICD-10-CM | POA: Diagnosis present

## 2017-04-23 DIAGNOSIS — E876 Hypokalemia: Secondary | ICD-10-CM | POA: Diagnosis present

## 2017-04-23 DIAGNOSIS — N183 Chronic kidney disease, stage 3 unspecified: Secondary | ICD-10-CM

## 2017-04-23 DIAGNOSIS — Z6825 Body mass index (BMI) 25.0-25.9, adult: Secondary | ICD-10-CM

## 2017-04-23 DIAGNOSIS — I129 Hypertensive chronic kidney disease with stage 1 through stage 4 chronic kidney disease, or unspecified chronic kidney disease: Secondary | ICD-10-CM | POA: Diagnosis present

## 2017-04-23 DIAGNOSIS — I10 Essential (primary) hypertension: Secondary | ICD-10-CM | POA: Diagnosis present

## 2017-04-23 DIAGNOSIS — E1122 Type 2 diabetes mellitus with diabetic chronic kidney disease: Secondary | ICD-10-CM | POA: Diagnosis present

## 2017-04-23 DIAGNOSIS — Z7901 Long term (current) use of anticoagulants: Secondary | ICD-10-CM

## 2017-04-23 DIAGNOSIS — I82403 Acute embolism and thrombosis of unspecified deep veins of lower extremity, bilateral: Secondary | ICD-10-CM | POA: Diagnosis not present

## 2017-04-23 DIAGNOSIS — E440 Moderate protein-calorie malnutrition: Secondary | ICD-10-CM

## 2017-04-23 DIAGNOSIS — K449 Diaphragmatic hernia without obstruction or gangrene: Secondary | ICD-10-CM | POA: Diagnosis present

## 2017-04-23 DIAGNOSIS — I340 Nonrheumatic mitral (valve) insufficiency: Secondary | ICD-10-CM | POA: Diagnosis not present

## 2017-04-23 DIAGNOSIS — R531 Weakness: Secondary | ICD-10-CM

## 2017-04-23 DIAGNOSIS — I82423 Acute embolism and thrombosis of iliac vein, bilateral: Secondary | ICD-10-CM | POA: Diagnosis not present

## 2017-04-23 DIAGNOSIS — Z79899 Other long term (current) drug therapy: Secondary | ICD-10-CM

## 2017-04-23 DIAGNOSIS — I82409 Acute embolism and thrombosis of unspecified deep veins of unspecified lower extremity: Secondary | ICD-10-CM

## 2017-04-23 DIAGNOSIS — L97229 Non-pressure chronic ulcer of left calf with unspecified severity: Secondary | ICD-10-CM | POA: Diagnosis present

## 2017-04-23 DIAGNOSIS — I70299 Other atherosclerosis of native arteries of extremities, unspecified extremity: Secondary | ICD-10-CM | POA: Diagnosis present

## 2017-04-23 LAB — URINALYSIS, ROUTINE W REFLEX MICROSCOPIC
Bilirubin Urine: NEGATIVE
Glucose, UA: 50 mg/dL — AB
KETONES UR: NEGATIVE mg/dL
NITRITE: NEGATIVE
PROTEIN: NEGATIVE mg/dL
Specific Gravity, Urine: 1.015 (ref 1.005–1.030)
pH: 5 (ref 5.0–8.0)

## 2017-04-23 LAB — CBC WITH DIFFERENTIAL/PLATELET
BASOS ABS: 0 10*3/uL (ref 0.0–0.1)
Basophils Relative: 0 %
EOS PCT: 1 %
Eosinophils Absolute: 0.2 10*3/uL (ref 0.0–0.7)
HCT: 40.2 % (ref 39.0–52.0)
Hemoglobin: 12.6 g/dL — ABNORMAL LOW (ref 13.0–17.0)
LYMPHS ABS: 2.7 10*3/uL (ref 0.7–4.0)
Lymphocytes Relative: 20 %
MCH: 27.8 pg (ref 26.0–34.0)
MCHC: 31.3 g/dL (ref 30.0–36.0)
MCV: 88.5 fL (ref 78.0–100.0)
Monocytes Absolute: 0.8 10*3/uL (ref 0.1–1.0)
Monocytes Relative: 6 %
NEUTROS PCT: 73 %
Neutro Abs: 10 10*3/uL (ref 1.7–7.7)
Platelets: 295 10*3/uL (ref 150–400)
RBC: 4.54 MIL/uL (ref 4.22–5.81)
RDW: 17.4 % — AB (ref 11.5–15.5)
WBC: 13.7 10*3/uL — AB (ref 4.0–10.5)

## 2017-04-23 LAB — COMPREHENSIVE METABOLIC PANEL
ALK PHOS: 101 U/L (ref 38–126)
ALT: 26 U/L (ref 17–63)
AST: 32 U/L (ref 15–41)
Albumin: 3.4 g/dL — ABNORMAL LOW (ref 3.5–5.0)
Anion gap: 16 — ABNORMAL HIGH (ref 5–15)
BUN: 35 mg/dL — ABNORMAL HIGH (ref 6–20)
CALCIUM: 9.6 mg/dL (ref 8.9–10.3)
CO2: 20 mmol/L — AB (ref 22–32)
CREATININE: 1.94 mg/dL — AB (ref 0.61–1.24)
Chloride: 107 mmol/L (ref 101–111)
GFR calc Af Amer: 34 mL/min — ABNORMAL LOW (ref >60)
GFR calc non Af Amer: 29 mL/min — ABNORMAL LOW (ref >60)
Glucose, Bld: 258 mg/dL — ABNORMAL HIGH (ref 65–99)
Potassium: 3.2 mmol/L — ABNORMAL LOW (ref 3.5–5.1)
SODIUM: 143 mmol/L (ref 135–145)
Total Bilirubin: 0.6 mg/dL (ref 0.3–1.2)
Total Protein: 7 g/dL (ref 6.5–8.1)

## 2017-04-23 LAB — LACTIC ACID, PLASMA: Lactic Acid, Venous: 3.2 mmol/L (ref 0.5–1.9)

## 2017-04-23 LAB — PROCALCITONIN: Procalcitonin: 0.27 ng/mL

## 2017-04-23 LAB — I-STAT CG4 LACTIC ACID, ED
LACTIC ACID, VENOUS: 3.43 mmol/L — AB (ref 0.5–1.9)
Lactic Acid, Venous: 5.13 mmol/L (ref 0.5–1.9)

## 2017-04-23 LAB — PROTIME-INR
INR: 1.23
PROTHROMBIN TIME: 15.4 s — AB (ref 11.4–15.2)

## 2017-04-23 LAB — I-STAT TROPONIN, ED: Troponin i, poc: 0 ng/mL (ref 0.00–0.08)

## 2017-04-23 MED ORDER — MAGNESIUM OXIDE 400 (241.3 MG) MG PO TABS
400.0000 mg | ORAL_TABLET | Freq: Two times a day (BID) | ORAL | Status: DC
  Administered 2017-04-24 – 2017-04-26 (×5): 400 mg via ORAL
  Filled 2017-04-23 (×7): qty 1

## 2017-04-23 MED ORDER — POLYSACCHARIDE IRON COMPLEX 150 MG PO CAPS
150.0000 mg | ORAL_CAPSULE | Freq: Two times a day (BID) | ORAL | Status: DC
  Administered 2017-04-24 – 2017-04-26 (×5): 150 mg via ORAL
  Filled 2017-04-23 (×8): qty 1

## 2017-04-23 MED ORDER — CALCIUM CARBONATE ANTACID 500 MG PO CHEW
1.0000 | CHEWABLE_TABLET | Freq: Two times a day (BID) | ORAL | Status: DC
  Administered 2017-04-24 – 2017-04-26 (×5): 200 mg via ORAL
  Filled 2017-04-23 (×7): qty 1

## 2017-04-23 MED ORDER — ACETAMINOPHEN 325 MG PO TABS: 650.0000 mg | ORAL_TABLET | Freq: Four times a day (QID) | ORAL | Status: DC | PRN

## 2017-04-23 MED ORDER — HEPARIN BOLUS VIA INFUSION
4000.0000 [IU] | Freq: Once | INTRAVENOUS | Status: AC
  Administered 2017-04-23: 4000 [IU] via INTRAVENOUS
  Filled 2017-04-23: qty 4000

## 2017-04-23 MED ORDER — POLYVINYL ALCOHOL 1.4 % OP SOLN: 1.0000 [drp] | Freq: Every day | OPHTHALMIC | Status: DC | PRN

## 2017-04-23 MED ORDER — PIPERACILLIN-TAZOBACTAM 3.375 G IVPB
3.3750 g | Freq: Three times a day (TID) | INTRAVENOUS | Status: DC
  Administered 2017-04-23 – 2017-04-24 (×2): 3.375 g via INTRAVENOUS
  Filled 2017-04-23 (×2): qty 50

## 2017-04-23 MED ORDER — HEPARIN (PORCINE) IN NACL 100-0.45 UNIT/ML-% IJ SOLN
1300.0000 [IU]/h | INTRAMUSCULAR | Status: DC
  Administered 2017-04-23 – 2017-04-24 (×2): 1300 [IU]/h via INTRAVENOUS
  Filled 2017-04-23 (×2): qty 250

## 2017-04-23 MED ORDER — VANCOMYCIN HCL IN DEXTROSE 1-5 GM/200ML-% IV SOLN: 1000.0000 mg | Freq: Once | INTRAVENOUS | Status: DC

## 2017-04-23 MED ORDER — POTASSIUM CHLORIDE CRYS ER 20 MEQ PO TBCR
30.0000 meq | EXTENDED_RELEASE_TABLET | Freq: Once | ORAL | Status: AC
  Administered 2017-04-23: 30 meq via ORAL
  Filled 2017-04-23: qty 2

## 2017-04-23 MED ORDER — CHOLECALCIFEROL 10 MCG (400 UNIT) PO TABS
400.0000 [IU] | ORAL_TABLET | Freq: Two times a day (BID) | ORAL | Status: DC
  Administered 2017-04-24 – 2017-04-26 (×5): 400 [IU] via ORAL
  Filled 2017-04-23 (×9): qty 1

## 2017-04-23 MED ORDER — HEPARIN SODIUM (PORCINE) 5000 UNIT/ML IJ SOLN: 5000.0000 [IU] | Freq: Three times a day (TID) | INTRAMUSCULAR | Status: DC

## 2017-04-23 MED ORDER — MIDODRINE HCL 5 MG PO TABS
5.0000 mg | ORAL_TABLET | Freq: Two times a day (BID) | ORAL | Status: DC
  Administered 2017-04-24 – 2017-04-26 (×6): 5 mg via ORAL
  Filled 2017-04-23 (×9): qty 1

## 2017-04-23 MED ORDER — ACETAMINOPHEN 650 MG RE SUPP: 650.0000 mg | Freq: Four times a day (QID) | RECTAL | Status: DC | PRN

## 2017-04-23 MED ORDER — VITAMIN C 500 MG PO TABS
500.0000 mg | ORAL_TABLET | Freq: Every day | ORAL | Status: DC
  Administered 2017-04-24 – 2017-04-26 (×3): 500 mg via ORAL
  Filled 2017-04-23 (×3): qty 1

## 2017-04-23 MED ORDER — PANTOPRAZOLE SODIUM 40 MG PO TBEC
40.0000 mg | DELAYED_RELEASE_TABLET | Freq: Every day | ORAL | Status: DC
  Administered 2017-04-24 – 2017-04-26 (×3): 40 mg via ORAL
  Filled 2017-04-23 (×3): qty 1

## 2017-04-23 MED ORDER — HYDROCODONE-ACETAMINOPHEN 5-325 MG PO TABS
1.0000 | ORAL_TABLET | ORAL | Status: DC | PRN
  Administered 2017-04-23: 1 via ORAL
  Filled 2017-04-23: qty 1

## 2017-04-23 MED ORDER — VANCOMYCIN HCL IN DEXTROSE 1-5 GM/200ML-% IV SOLN
1000.0000 mg | INTRAVENOUS | Status: DC
Start: 2017-04-24 — End: 2017-04-25
  Administered 2017-04-24: 1000 mg via INTRAVENOUS
  Filled 2017-04-23 (×2): qty 200

## 2017-04-23 MED ORDER — ATORVASTATIN CALCIUM 10 MG PO TABS
10.0000 mg | ORAL_TABLET | Freq: Every evening | ORAL | Status: DC
  Administered 2017-04-24 – 2017-04-26 (×3): 10 mg via ORAL
  Filled 2017-04-23 (×4): qty 1

## 2017-04-23 MED ORDER — SENNOSIDES-DOCUSATE SODIUM 8.6-50 MG PO TABS: 1.0000 | ORAL_TABLET | Freq: Every evening | ORAL | Status: DC | PRN

## 2017-04-23 MED ORDER — VANCOMYCIN HCL 10 G IV SOLR
1750.0000 mg | Freq: Once | INTRAVENOUS | Status: AC
  Administered 2017-04-23: 1750 mg via INTRAVENOUS
  Filled 2017-04-23: qty 1750

## 2017-04-23 MED ORDER — PIPERACILLIN-TAZOBACTAM 3.375 G IVPB 30 MIN
3.3750 g | Freq: Once | INTRAVENOUS | Status: AC
  Administered 2017-04-23: 3.375 g via INTRAVENOUS
  Filled 2017-04-23: qty 50

## 2017-04-23 MED ORDER — SODIUM CHLORIDE 0.9 % IV BOLUS (SEPSIS)
1000.0000 mL | Freq: Once | INTRAVENOUS | Status: AC
  Administered 2017-04-23: 1000 mL via INTRAVENOUS

## 2017-04-23 MED ORDER — SODIUM CHLORIDE 0.9 % IV SOLN
INTRAVENOUS | Status: DC
  Administered 2017-04-23: 20:00:00 via INTRAVENOUS
  Filled 2017-04-23: qty 1000

## 2017-04-23 MED ORDER — ONDANSETRON HCL 4 MG PO TABS: 4.0000 mg | ORAL_TABLET | Freq: Four times a day (QID) | ORAL | Status: DC | PRN

## 2017-04-23 MED ORDER — ONDANSETRON HCL 4 MG/2ML IJ SOLN: 4.0000 mg | Freq: Four times a day (QID) | INTRAMUSCULAR | Status: DC | PRN

## 2017-04-23 MED ORDER — BISACODYL 10 MG RE SUPP: 10.0000 mg | Freq: Every day | RECTAL | Status: DC | PRN
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
## 2017-04-23 NOTE — ED Notes (Signed)
Dinner tray ordered; heart healthy diet 

## 2017-04-23 NOTE — ED Notes (Signed)
Patient transported to CT 
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
## 2017-04-23 NOTE — ED Notes (Signed)
ED Provider at bedside. 

## 2017-04-23 NOTE — ED Notes (Signed)
Nurse will draw labs. 

## 2017-04-23 NOTE — ED Notes (Signed)
IV team at bedside attempting to collect cultures and start another IV prior to starting antibiotics. Sun Lobbyist not working.

## 2017-04-23 NOTE — ED Triage Notes (Signed)
Pt here from home with c/ol near syncopal episode episodes v/s seizure like activity, pt just d/c'd from rehab yesterday

## 2017-04-23 NOTE — Progress Notes (Signed)
ANTICOAGULATION CONSULT NOTE - Initial Consult  Pharmacy Consult for Heparin Indication: atrial fibrillation and DVT  No Known Allergies  Patient Measurements: Height: 5\' 10"  (177.8 cm) Weight: 185 lb 8 oz (84.1 kg) IBW/kg (Calculated) : 73 Heparin Dosing Weight: 84 kg   Vital Signs: Temp: 98.6 F (37 C) (02/24 1158) Temp Source: Rectal (02/24 1158) BP: 138/70 (02/24 1545) Pulse Rate: 98 (02/24 1545)  Labs: Recent Labs    04/23/17 1120 04/23/17 1143 04/23/17 1351  HGB  --  12.6*  --   HCT  --  40.2  --   PLT  --  295  --   LABPROT  --   --  15.4*  INR  --   --  1.23  CREATININE 1.94*  --   --     Estimated Creatinine Clearance: 27.2 mL/min (A) (by C-G formula based on SCr of 1.94 mg/dL (H)).   Medical History: Past Medical History:  Diagnosis Date  . Arthritis   . Atherosclerosis of artery of extremity with ulceration ([place])    Treated at wound clinic in [place]  . CKD (chronic kidney disease) stage 3, GFR 30-59 ml/min (HCC)   . Diverticulosis   . DVT (deep venous thrombosis) ([place])   . Essential hypertension, benign   . GERD (gastroesophageal reflux disease)   . Hemorrhage 01/2010   Spontaneous hemorrhage of right thigh with possible over anticoagulation  . History of DVT of lower extremity 2007   Recurrent X 4, last one 01/2010 (after spontaneous hemorrhage of thigh)  . History of skin cancer   . Hyperlipidemia   . Paroxysmal atrial fibrillation (HCC)   . Renal calculus, right   . Renal cyst, left   . Spigelian hernia   . Type 2 diabetes mellitus ([place])   . Varicose veins     Medications:   (Not in a hospital admission)  Assessment: 67 YOM with a history of afib and multiple DVTs who is not anticoagulated by choice. Pharmacy consulted to start IV heparin. Hgb low, Plt wnl. Scr 1.94.    Goal of Therapy:  Heparin level 0.3-0.7 units/ml Monitor platelets by anticoagulation protocol: Yes   Plan:  -Heparin 4000 units IV once, then start heparin  infusion at 1300 units/hr -F/u 8 hr HL -Monitor CBC, daily HL and s/s of bleeding   Albertina Parr, PharmD., BCPS Clinical Pharmacist Pager [phone]

## 2017-04-23 NOTE — ED Notes (Signed)
Large wounded noted to pt right outer heel, redness and drainage. Imaged by PA to place in chart, heel foam dressing applied. Flaky wound to left inner calf. Wound to right posterior outter thigh. All wounds on legs imaged and in chart by PA.

## 2017-04-24 ENCOUNTER — Inpatient Hospital Stay (HOSPITAL_COMMUNITY): Payer: Medicare Other

## 2017-04-24 DIAGNOSIS — I340 Nonrheumatic mitral (valve) insufficiency: Secondary | ICD-10-CM

## 2017-04-24 DIAGNOSIS — L03115 Cellulitis of right lower limb: Secondary | ICD-10-CM

## 2017-04-24 DIAGNOSIS — I480 Paroxysmal atrial fibrillation: Secondary | ICD-10-CM

## 2017-04-24 DIAGNOSIS — D638 Anemia in other chronic diseases classified elsewhere: Secondary | ICD-10-CM

## 2017-04-24 DIAGNOSIS — I82409 Acute embolism and thrombosis of unspecified deep veins of unspecified lower extremity: Secondary | ICD-10-CM

## 2017-04-24 DIAGNOSIS — L97419 Non-pressure chronic ulcer of right heel and midfoot with unspecified severity: Secondary | ICD-10-CM

## 2017-04-24 LAB — ECHOCARDIOGRAM COMPLETE
EERAT: 11.54
FS: 21 % — AB (ref 28–44)
Height: 70 in
IVS/LV PW RATIO, ED: 0.89
LA ID, A-P, ES: 37 mm
LA diam end sys: 37 mm
LA diam index: 1.8 cm/m2
LA vol index: 21.4 mL/m2
LA vol: 43.9 mL
LAVOLA4C: 35.5 mL
LVEEAVG: 11.54
LVEEMED: 11.54
LVELAT: 6.64 cm/s
LVOT VTI: 20.8 cm
LVOT area: 3.46 cm2
LVOT diameter: 21 mm
LVOT peak grad rest: 4 mmHg
LVOT peak vel: 104 cm/s
LVOTSV: 72 mL
Lateral S' vel: 12.3 cm/s
MV pk A vel: 88.8 m/s
MV pk E vel: 76.6 m/s
MVPG: 2 mmHg
PW: 9 mm — AB (ref 0.6–1.1)
RV TAPSE: 14.6 mm
TDI e' lateral: 6.64
TDI e' medial: 6.42
Weight: 2968 oz

## 2017-04-24 LAB — URINE CULTURE

## 2017-04-24 LAB — BASIC METABOLIC PANEL
ANION GAP: 10 (ref 5–15)
BUN: 28 mg/dL — ABNORMAL HIGH (ref 6–20)
CHLORIDE: 113 mmol/L — AB (ref 101–111)
CO2: 18 mmol/L — AB (ref 22–32)
Calcium: 8.1 mg/dL — ABNORMAL LOW (ref 8.9–10.3)
Creatinine, Ser: 1.56 mg/dL — ABNORMAL HIGH (ref 0.61–1.24)
GFR calc Af Amer: 44 mL/min — ABNORMAL LOW (ref >60)
GFR, EST NON AFRICAN AMERICAN: 38 mL/min — AB (ref >60)
GLUCOSE: 158 mg/dL — AB (ref 65–99)
POTASSIUM: 2.8 mmol/L — AB (ref 3.5–5.1)
Sodium: 141 mmol/L (ref 135–145)

## 2017-04-24 LAB — CBC
HEMATOCRIT: 30.4 % — AB (ref 39.0–52.0)
HEMOGLOBIN: 9.7 g/dL — AB (ref 13.0–17.0)
MCH: 27.2 pg (ref 26.0–34.0)
MCHC: 31.9 g/dL (ref 30.0–36.0)
MCV: 85.2 fL (ref 78.0–100.0)
Platelets: 218 10*3/uL (ref 150–400)
RBC: 3.57 MIL/uL — ABNORMAL LOW (ref 4.22–5.81)
RDW: 16.9 % — ABNORMAL HIGH (ref 11.5–15.5)
WBC: 11 10*3/uL — ABNORMAL HIGH (ref 4.0–10.5)

## 2017-04-24 LAB — HEMOGLOBIN A1C
HEMOGLOBIN A1C: 6.4 % — AB (ref 4.8–5.6)
MEAN PLASMA GLUCOSE: 137 mg/dL

## 2017-04-24 LAB — PROTIME-INR
INR: 1.44
Prothrombin Time: 17.4 seconds — ABNORMAL HIGH (ref 11.4–15.2)

## 2017-04-24 LAB — HEPARIN LEVEL (UNFRACTIONATED): Heparin Unfractionated: 1.12 IU/mL — ABNORMAL HIGH (ref 0.30–0.70)

## 2017-04-24 LAB — LACTIC ACID, PLASMA: Lactic Acid, Venous: 2.1 mmol/L (ref 0.5–1.9)

## 2017-04-24 LAB — GLUCOSE, CAPILLARY: GLUCOSE-CAPILLARY: 131 mg/dL — AB (ref 65–99)

## 2017-04-24 MED ORDER — COLLAGENASE 250 UNIT/GM EX OINT
TOPICAL_OINTMENT | Freq: Every day | CUTANEOUS | Status: DC
  Administered 2017-04-24 – 2017-04-26 (×3): via TOPICAL
  Filled 2017-04-24: qty 30

## 2017-04-24 MED ORDER — BACITRACIN-NEOMYCIN-POLYMYXIN OINTMENT TUBE
TOPICAL_OINTMENT | Freq: Every day | CUTANEOUS | Status: DC
  Administered 2017-04-24 – 2017-04-26 (×3): via TOPICAL
  Filled 2017-04-24: qty 14.17

## 2017-04-24 MED ORDER — ENOXAPARIN SODIUM 40 MG/0.4ML ~~LOC~~ SOLN: 40.0000 mg | SUBCUTANEOUS | Status: DC

## 2017-04-24 MED ORDER — METRONIDAZOLE 500 MG PO TABS
500.0000 mg | ORAL_TABLET | Freq: Three times a day (TID) | ORAL | Status: DC
  Administered 2017-04-24 – 2017-04-26 (×7): 500 mg via ORAL
  Filled 2017-04-24 (×7): qty 1

## 2017-04-24 MED ORDER — ENOXAPARIN SODIUM 100 MG/ML ~~LOC~~ SOLN
1.0000 mg/kg | SUBCUTANEOUS | Status: DC
  Administered 2017-04-25 – 2017-04-26 (×2): 85 mg via SUBCUTANEOUS
  Filled 2017-04-24 (×3): qty 1

## 2017-04-24 MED ORDER — SODIUM CHLORIDE 0.9 % IV SOLN
2.0000 g | INTRAVENOUS | Status: DC
  Administered 2017-04-24 – 2017-04-26 (×3): 2 g via INTRAVENOUS
  Filled 2017-04-24 (×3): qty 20

## 2017-04-24 NOTE — Progress Notes (Signed)
ANTICOAGULATION CONSULT NOTE  Pharmacy Consult for Heparin Indication: atrial fibrillation and DVT  Patient Measurements: Height: 5\' 10"  (177.8 cm) Weight: 185 lb 8 oz (84.1 kg) IBW/kg (Calculated) : 73 Heparin Dosing Weight: 84 kg   Vital Signs: BP: 152/42 (02/24 1900) Pulse Rate: 99 (02/24 1900)  Labs: Recent Labs    04/23/17 1120 04/23/17 1143 04/23/17 1351 04/24/17 0155 04/24/17 0207  HGB  --  12.6*  --   --  9.7*  HCT  --  40.2  --   --  30.4*  PLT  --  295  --   --  218  LABPROT  --   --  15.4*  --  17.4*  INR  --   --  1.23  --  1.44  HEPARINUNFRC  --   --   --  1.12*  --   CREATININE 1.94*  --   --   --   --     Assessment: 43 YOM with a history of afib and multiple DVTs who is not anticoagulated by choice. Initial heparin level is supratherapeutic; however after discussing with RN, the drip was started earlier but the first nurse never gave the bolus, so the bolus was given when this realized at 2300. This bolus is likely having an effect on the level. Will recheck level in 8 hours from bolus.   Goal of Therapy:  Heparin level 0.3-0.7 units/ml Monitor platelets by anticoagulation protocol: Yes   Plan:  -Continue current rate -Daily HL, CBC -Check level in a few hours  Harvel Quale  04/24/2017 3:23 AM

## 2017-04-24 NOTE — Progress Notes (Addendum)
Pharmacy Antibiotic Note  Don Hall. is a 84 y.o. male admitted on 04/23/2017 with diabetic foot infection, sepsis.  Pharmacy has been consulted for vancomycin dosing. Also started on ceftriaxone and flagyl per MD. Received 1x dose of zosyn on 2/24. Also received vancomycin 1750mg  IV load on 2/24 at 1304.  SCr down to 1.56, CrCl~30-35.  Plan: Continue vancomycin 1g IV q24h Ceftriaxone 2g IV q24h per MD Flagyl 500mg  IV q8h per MD Monitor clinical progress, c/s, renal function F/u de-escalation plan/LOT, vancomycin trough as indicated   Height: 5\' 10"  (177.8 cm) Weight: 185 lb 8 oz (84.1 kg) IBW/kg (Calculated) : 73  Temp (24hrs), Avg:98.6 F (37 C), Min:98.6 F (37 C), Max:98.6 F (37 C)  Recent Labs  Lab 04/23/17 1120 04/23/17 1143 04/23/17 1153 04/23/17 1403 04/23/17 1800 04/23/17 2340 04/24/17 0207  WBC  --  13.7*  --   --   --   --  11.0*  CREATININE 1.94*  --   --   --   --   --  1.56*  LATICACIDVEN  --   --  5.13* 3.43* 3.2* 2.1*  --     Estimated Creatinine Clearance: 33.8 mL/min (A) (by C-G formula based on SCr of 1.56 mg/dL (H)).    No Known Allergies  Elicia Lamp, PharmD, BCPS Clinical Pharmacist 04/24/2017 11:23 AM

## 2017-04-24 NOTE — Progress Notes (Signed)
CSW acknowledges consult for medication needs. For further assistance with this matter please consult RNCM. CSW signing off as there are no further CSW needs at this time. Please re consult if new need arises.     [name] [name], MSW, [facility] Clinical Social Worker [phone]

## 2017-04-24 NOTE — Progress Notes (Signed)
PROGRESS NOTE  Don Hall. HKV:[id number] DOB: Dec 26, 1927 DOA: 04/23/2017 PCP: Caryl Bis, MD  Brief Narrative: 84 year old man complicated PMH, just released from the nursing home day prior to admission after 29-month stay for treatment of skin graft after a large hematoma who presented for evaluation of reported loss of consciousness.  Seems to be associated with changing positions from standing to sitting.  "Jerking movements" for 2 weeks.  Admitted for sepsis secondary to right heel wound.  Assessment/Plan Sepsis secondary to right heel wound - hemodynamics now stable. Will downgrade to med-surg status - continue empiric abx - wound care consult  Orthostatic hypotension - IVF given  Chronic leg wounds - consult wound care RN  Myoclonic jerks, possibly medication related.  CT head negative. Zoloft on hold.  Diabetes mellitus type 2, diet-controlled.  Hemoglobin A1c 6.4 - start SSI  Anemia of chronic disease - Hemoglobin appears stable  Afib - stable  Pyuria - already on abx  CKD stage III - appears to be at baseline   PMH multiple DVTs - patient on warfarin until hospitalization November and was not discharged on anticogualation but he would like to resume warfarin. Will resume warfarin next 48 hours if condition stable and no debridement planned - stop IV heparin, start enoxaparin  DVT prophylaxis: enoxaparin Code Status: full Family Communication: none Disposition Plan: home?    Murray Hodgkins, MD  [facility] Direct contact: [phone] --Via amion app OR  --[email or web address]; password [email or web address]  7PM-7AM contact night coverage as above 04/24/2017, 11:04 AM  LOS: 1 day   Consultants:    Procedures:    Antimicrobials:  Zosyn 2/24 >>  Vancomycin 2/24 >>  Interval history/Subjective: Feels ok, no n/v. Eating ok. Breathing ok.  Objective: Vitals:  Vitals:   04/24/17 1015 04/24/17 1030  BP: (!) 125/52 138/71  Pulse: 77   Resp: 17    Temp:    SpO2: 98% 100%    Exam:  Constitutional:  . Appears calm and comfortable, eating lunch Eyes:  . pupils and irises appear normal . Normal lids ENMT:  . grossly normal hearing  . Lips appear normal Respiratory:  . CTA bilaterally, no w/r/r.  . Respiratory effort normal Cardiovascular:  . RRR, no m/r/g . No LE extremity edema   Skin:  . Open wound right heel noted Psychiatric:  . Mental status o Mood, affect appropriate . judgement and insight appear normal   I have personally reviewed the following:   Labs:  Potassium 2.8, CO2 18, BUN 28, creatinine trending down, 1.56.  Hemoglobin 9.5, stable compared to previous values  Imaging studies:  CT head, neck no acute findings  Chest x-ray no acute disease  Medical tests:  EKG sinus tachycardia, rate related changes   Scheduled Meds: . atorvastatin  10 mg Oral QPM  . calcium carbonate  1 tablet Oral BID  . cholecalciferol  400 Units Oral BID  . enoxaparin (LOVENOX) injection  40 mg Subcutaneous Q24H  . iron polysaccharides  150 mg Oral BID  . magnesium oxide  400 mg Oral BID  . midodrine  5 mg Oral BID WC  . pantoprazole  40 mg Oral Daily  . vitamin C  500 mg Oral Daily   Continuous Infusions: . piperacillin-tazobactam (ZOSYN)  IV Stopped (04/24/17 0740)  . 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

## 2017-04-24 NOTE — ED Notes (Signed)
Attemopted to call report

## 2017-04-24 NOTE — Discharge Planning (Signed)
[facility] consulted in regards to medication assistance.  Pt has insurance (Medicare and BCBS)  coverage and is not eligible for [facility] program at this time. Discharging CM may explore discounts cards and programs available to patient.

## 2017-04-24 NOTE — Progress Notes (Signed)
  Echocardiogram 2D Echocardiogram has been performed.  Don Hall 04/24/2017, 5:46 PM

## 2017-04-24 NOTE — Care Management Note (Addendum)
Case Management Note  Patient Details  Name: Don Hall. MRN: [id number] Date of Birth: Mar 07, 1927  Subjective/Objective:                    Action/Plan:  Consult for home health needs and DME. Will follow for discharge needs, will need order home health RN orders with wound care instructions. He is followed by the outpatient wound care center in [facility]   PT/OT for DME needs    Expected Discharge Date:                  Expected Discharge Plan:  [place]  In-House Referral:     Discharge planning Services  CM Consult  Post Acute Care Choice:  Home Health, Durable Medical Equipment Choice offered to:     DME Arranged:    DME Agency:     HH Arranged:    [place] Agency:     Status of Service:  In process, will continue to follow  If discussed at Long Length of Stay Meetings, dates discussed:    Additional Comments:  Marilu Favre, RN 04/24/2017, 3:17 PM

## 2017-04-24 NOTE — ED Notes (Signed)
Pt talking on phone with family °

## 2017-04-24 NOTE — Consult Note (Addendum)
[place] Nurse wound consult note Reason for Consult: Consult requested for right heel and left leg.  Pt states he is followed by the outpatient wound care center in [place], and recently had a skin graft applied to left leg. Pt states they have ordered neosporin to the wound edges on the LLE. and nonadherent dressing to skin graft site  Skin graft intact and well adhered to left calf; affected area is dark red and14X4cm, no odor, drainage, or fluctuance. Right heel with unstageable pressure injury; 1.3X1.3cm, 100% yellow slough, no odor or drainage. Pressure Injury POA: Yes Dressing procedure/placement/frequency: Float heels to reduce pressure.  Santyl ointment to provide enzymatic debridement of nonviable tissue to right heel.  Continue present plan of care with neosporin ointment to left graft edges and nonadherent dressing. Pt verbalized understanding and he states he will followup with the outpatient wound care center after discharge. Please re-consult if further assistance is needed.  Thank-you,  Julien Girt MSN, [place], [place], [place], [place]

## 2017-04-24 NOTE — ED Notes (Addendum)
Pt states he wants food with medication. Will call for food.

## 2017-04-25 ENCOUNTER — Inpatient Hospital Stay (HOSPITAL_COMMUNITY): Payer: Medicare Other

## 2017-04-25 DIAGNOSIS — I82423 Acute embolism and thrombosis of iliac vein, bilateral: Secondary | ICD-10-CM

## 2017-04-25 DIAGNOSIS — L97909 Non-pressure chronic ulcer of unspecified part of unspecified lower leg with unspecified severity: Secondary | ICD-10-CM

## 2017-04-25 LAB — GLUCOSE, CAPILLARY
GLUCOSE-CAPILLARY: 109 mg/dL — AB (ref 65–99)
GLUCOSE-CAPILLARY: 188 mg/dL — AB (ref 65–99)
Glucose-Capillary: 170 mg/dL — ABNORMAL HIGH (ref 65–99)

## 2017-04-25 MED ORDER — VANCOMYCIN HCL IN DEXTROSE 1-5 GM/200ML-% IV SOLN
1000.0000 mg | INTRAVENOUS | Status: DC
  Administered 2017-04-25 – 2017-04-26 (×2): 1000 mg via INTRAVENOUS
  Filled 2017-04-25 (×2): qty 200

## 2017-04-25 MED ORDER — BOOST PO LIQD
237.0000 mL | Freq: Two times a day (BID) | ORAL | Status: DC
  Administered 2017-04-26 (×2): 237 mL via ORAL
  Filled 2017-04-25 (×8): qty 237

## 2017-04-25 MED ORDER — INSULIN ASPART 100 UNIT/ML ~~LOC~~ SOLN
0.0000 [IU] | Freq: Three times a day (TID) | SUBCUTANEOUS | Status: DC
  Administered 2017-04-25: 2 [IU] via SUBCUTANEOUS
  Administered 2017-04-26 – 2017-04-27 (×4): 1 [IU] via SUBCUTANEOUS
  Administered 2017-04-27: 2 [IU] via SUBCUTANEOUS

## 2017-04-25 MED ORDER — SERTRALINE HCL 100 MG PO TABS
100.0000 mg | ORAL_TABLET | Freq: Every evening | ORAL | Status: DC
  Administered 2017-04-25 – 2017-04-26 (×2): 100 mg via ORAL
  Filled 2017-04-25 (×2): qty 1

## 2017-04-25 MED ORDER — DOXYCYCLINE HYCLATE 100 MG PO TABS: 100.0000 mg | ORAL_TABLET | Freq: Two times a day (BID) | ORAL | Status: DC

## 2017-04-25 MED ORDER — POTASSIUM CHLORIDE CRYS ER 20 MEQ PO TBCR
40.0000 meq | EXTENDED_RELEASE_TABLET | ORAL | Status: AC
  Administered 2017-04-25 (×2): 40 meq via ORAL
  Filled 2017-04-25 (×2): qty 2

## 2017-04-25 NOTE — Evaluation (Signed)
Occupational Therapy Evaluation Patient Details Name: Don Hall. MRN: [id number] DOB: 20-Nov-1927 Today's Date: 04/25/2017    History of Present Illness 84 year old gentleman presents with cellulitis due to a large heel wound.  He was discharged from skilled nursing facility yesterday after 3 months at facility for rehab after a skin graft due to a large hematoma.   Clinical Impression   PTA, pt had just returned home from SNF rehabilitation stay. He reports use of w/c for all mobility and assistance required for transfers. Inconsistencies noted in pt's report concerning ADL abilities PTA. Pt was able to sit at EOB for ADL participation with supervision for balance. He required total assist for LB ADL and min assist for UB ADL. He was able to attempt to scoot toward [facility] but unsuccessful without assistance. Limited evaluation today as transport team arrived to take pt to vascular lab. Pt would benefit from continued OT services while admitted to improve independence and safety with ADL and functional mobility. Unless family able to provide the necessary hands-on assistance, recommend SNF level rehabilitation post-acute D/C.    Follow Up Recommendations  SNF;Supervision/Assistance - 24 hour    Equipment Recommendations  Other (comment)(TBD )    Recommendations for Other Services       Precautions / Restrictions Precautions Precautions: Fall      Mobility Bed Mobility Overal bed mobility: Needs Assistance Bed Mobility: Supine to Sit;Sit to Supine     Supine to sit: Min guard Sit to supine: Min assist   General bed mobility comments: Assist for BLE back into bed.   Transfers                      Balance Overall balance assessment: Needs assistance Sitting-balance support: No upper extremity supported;Feet supported Sitting balance-Leahy Scale: Fair Sitting balance - Comments: Supervision at EOB                                   ADL either  performed or assessed with clinical judgement   ADL Overall ADL's : Needs assistance/impaired Eating/Feeding: Set up;Sitting   Grooming: Set up;Sitting   Upper Body Bathing: Minimal assistance;Sitting   Lower Body Bathing: Total assistance;Sit to/from stand   Upper Body Dressing : Minimal assistance;Sitting   Lower Body Dressing: Total assistance;Sit to/from stand                 General ADL Comments: Able to complete ADL tasks seated at EOB. Limited session secondary to transport arriving to take pt to vascular lab. Pt able to attempt to scoot up in bed but requiring significant assistance.      Vision Patient Visual Report: No change from baseline Vision Assessment?: No apparent visual deficits     Perception     Praxis      Pertinent Vitals/Pain Pain Assessment: Faces Faces Pain Scale: Hurts a little bit Pain Location: R foot when donning socks Pain Descriptors / Indicators: Grimacing Pain Intervention(s): Monitored during session     Hand Dominance     Extremity/Trunk Assessment Upper Extremity Assessment Upper Extremity Assessment: Generalized weakness   Lower Extremity Assessment Lower Extremity Assessment: Defer to PT evaluation       Communication     Cognition Arousal/Alertness: Awake/alert Behavior During Therapy: WFL for tasks assessed/performed Overall Cognitive Status: Within Functional Limits for tasks assessed  General Comments       Exercises     Shoulder Instructions      Home Living Family/patient expects to be discharged to:: Private residence Living Arrangements: Spouse/significant other Available Help at Discharge: Family(wife is elderly and limited ability to assist) Type of Home: House Home Access: Stairs to enter [employer] of Steps: 2 in front; 3 on side Entrance Stairs-Rails: Right;Left;Can reach both Home Layout: (3 steps between kitchen and dining  room)     Bathroom Shower/Tub: Occupational psychologist: Handicapped height Bathroom Accessibility: Yes How Accessible: Accessible via wheelchair [place]: [place] - 4 wheels;Bedside commode;Shower seat;Wheelchair - manual          Prior Functioning/Environment Level of Independence: Needs assistance  Gait / Transfers Assistance Needed: Reports 2 people help him transfer from bed to wheelchair. However, then reported that he was ambulating short distances with assistance and RW ADL's / Homemaking Assistance Needed: States "I do that pretty well" when asked about dressing and bathing tasks. However, later reporting that he has not been able to don/doff socks on his own for a long time.     Comments: Pt has only been home from SNF for 1 day.         OT Problem List: Decreased strength;Decreased range of motion;Decreased activity tolerance;Impaired balance (sitting and/or standing);Decreased safety awareness;Decreased knowledge of use of DME or AE;Decreased knowledge of precautions;Pain;Decreased cognition      OT Treatment/Interventions: Self-care/ADL training;Therapeutic exercise;Energy conservation;DME and/or AE instruction;Therapeutic activities;Patient/family education;Balance training    OT Goals(Current goals can be found in the care plan section) Acute Rehab OT Goals Patient Stated Goal: go home OT Goal Formulation: With patient Time For Goal Achievement: 05/09/17 Potential to Achieve Goals: Good ADL Goals Pt Will Perform Upper Body Dressing: with set-up;sitting Pt Will Perform Lower Body Dressing: with min assist;sitting/lateral leans Pt Will Transfer to Toilet: with min assist;squat pivot transfer;bedside commode(drop-arm BSC) Pt Will Perform Toileting - Clothing Manipulation and hygiene: with min assist;sitting/lateral leans Pt/caregiver will Perform Home Exercise Program: With Supervision;Increased strength;Both right and left upper extremity;With written  HEP provided  OT Frequency: Min 2X/week   Barriers to D/C:            Co-evaluation              AM-PAC PT "6 Clicks" Daily Activity     Outcome Measure Help from another person eating meals?: A Little Help from another person taking care of personal grooming?: A Little Help from another person toileting, which includes using toliet, bedpan, or urinal?: A Lot Help from another person bathing (including washing, rinsing, drying)?: A Lot Help from another person to put on and taking off regular upper body clothing?: A Little Help from another person to put on and taking off regular lower body clothing?: A Lot 6 Click Score: 15   End of Session Nurse Communication: Mobility status(RN present during portions of session)  Activity Tolerance: Patient tolerated treatment well Patient left: in bed;with call bell/phone within reach(with transport preparing to leave for vascular lab)  OT Visit Diagnosis: Other abnormalities of gait and mobility (R26.89)                Time: 0867-6195 OT Time Calculation (min): 22 min Charges:  OT General Charges $OT Visit: 1 Visit OT Evaluation $OT Eval Moderate Complexity: 1 Mod G-Codes:     Norman Herrlich, MS OTR/L  Pager: [place] 04/25/2017, 10:57 AM

## 2017-04-25 NOTE — Progress Notes (Signed)
PROGRESS NOTE  Don Hall. SWN:[id number] DOB: 1928-02-20 DOA: 04/23/2017 PCP: Caryl Bis, MD  Brief Narrative: 84 year old man complicated PMH, just released from the nursing home day prior to admission after 31-month stay for treatment of skin graft after a large hematoma who presented for evaluation of reported loss of consciousness.  Seems to be associated with changing positions from standing to sitting.  "Jerking movements" for 2 weeks.  Admitted for sepsis secondary to right heel wound.  Assessment/Plan Sepsis secondary to right heel wound -Appears to be be improving rapidly.  Continue empiric antibiotics, can likely narrow 2/27.  Orthostatic hypotension -Treated with IV fluids  Chronic leg wounds -Continue wound care per wound care RN  Myoclonic jerks, possibly medication related.  CT head negative. Zoloft on hold. -Appears resolved.  Resume Zoloft.  Diabetes mellitus type 2, diet-controlled.  Hemoglobin A1c 6.4 -Sliding scale insulin  Anemia of chronic disease - Hemoglobin appeared stable on admission  Afib -Stable.  Not on rate control agent.  CKD stage III - appears to be at baseline   PMH multiple DVTs - patient on warfarin until hospitalization November and was not discharged on anticogualation but he would like to resume warfarin. Will resume warfarin next 48 hours if condition stable and no debridement planned - stop IV heparin, start enoxaparin  Appears to be improving rapidly.  Can likely narrow antibiotics 2/27.  DVT prophylaxis: enoxaparin Code Status: full Family Communication: none Disposition Plan: SNF vs home   Murray Hodgkins, MD  [facility] Direct contact: [phone] --Via [place]  --[email or web address]; password [email or web address]  7PM-7AM contact night coverage as above 04/25/2017, 2:38 PM  LOS: 2 days   Consultants:    Procedures:  Echo Study Conclusions  - Left ventricle: The cavity size was normal. Wall thickness was  normal. Systolic function was normal. The estimated ejection   fraction was in the range of 55% to 60%. - Aortic valve: There was trivial regurgitation. - Mitral valve: There was mild regurgitation.  Antimicrobials:  Zosyn 2/24 >>  Vancomycin 2/24 >>  Interval history/Subjective: Feels better.  Breathing fine.  2/10 pain right ankle.  Objective: Vitals:  Vitals:   04/24/17 2250 04/25/17 0409  BP: (!) 117/41 (!) 116/57  Pulse: 64 (!) 57  Resp: 18 18  Temp: 97.7 F (36.5 C) 97.6 F (36.4 C)  SpO2: 97% 98%    Exam:  Constitutional:   . Appears calm and comfortable Respiratory:  . CTA bilaterally, no w/r/r.  . Respiratory effort normal. No retractions or accessory muscle use Cardiovascular:  . RRR, no m/r/g . No LE extremity edema   Abdomen:  . Soft Musculoskeletal:  . Digits/nails BUE: no clubbing, cyanosis, petechiae, infection . exam of joints, bones, muscles of at least one of following: head/neck, RUE, LUE, RLE, LLE   o strength and tone normal, no atrophy, no abnormal movements o No tenderness, masses o Normal ROM, no contractures  . gait and station Skin:  . Right ankle wound appears unchanged Psychiatric:  . Mental status o Mood, affect appropriate  I have personally reviewed the following:   Labs:  Blood sugars stable  Scheduled Meds: . atorvastatin  10 mg Oral QPM  . calcium carbonate  1 tablet Oral BID  . cholecalciferol  400 Units Oral BID  . collagenase   Topical Daily  . enoxaparin (LOVENOX) injection  1 mg/kg Subcutaneous Q24H  . iron polysaccharides  150 mg Oral BID  . magnesium oxide  400 mg Oral  BID  . metroNIDAZOLE  500 mg Oral Q8H  . midodrine  5 mg Oral BID WC  . neomycin-bacitracin-polymyxin   Topical Daily  . pantoprazole  40 mg Oral Daily  . potassium chloride  40 mEq Oral Q4H  . vitamin C  500 mg Oral Daily   Continuous Infusions: . cefTRIAXone (ROCEPHIN)  IV Stopped (04/25/17 1126)  . vancomycin      Principal  Problem:   Sepsis ([place]) Active Problems:   Essential hypertension, benign   Paroxysmal atrial fibrillation (HCC)   Long term (current) use of anticoagulants   Atherosclerosis of artery of extremity with ulceration (HCC)   Varicose veins of left lower extremity with ulcer of calf (HCC)   Chronic kidney disease, stage 3 (HCC)   Myoclonus   Generalized weakness   Pre-syncope   Heel ulcer (HCC)   Orthostatic hypotension   Anemia of chronic disease   DVT (deep venous thrombosis) ([place])   LOS: 2 days

## 2017-04-25 NOTE — Progress Notes (Signed)
ABIs and TBIs completed. Bilateral - ABIs and TBIs are not reliable secondary to bilateral calcification of the arteries. [facility], [place] 04/25/2017 4:07 PM

## 2017-04-25 NOTE — Evaluation (Signed)
Physical Therapy Evaluation Patient Details Name: Don Hall. MRN: [id number] DOB: 1927-09-30 Today's Date: 04/25/2017   History of Present Illness  84 year old gentleman presents with cellulitis due to a large Right heel wound.  He was discharged from skilled nursing facility yesterday after 3 months at facility for rehab after a skin graft due to a large hematoma.  Clinical Impression   Pt admitted with above diagnosis. Pt currently with functional limitations due to the deficits listed below (see PT Problem List). Presents with overall decr functional mobility, generalized weakness, and decr activity tolerance; Prior to his original injury leading to hematoma, surgery, and SNF stay for rehab, he was completely independent; He is motivated to improve (indicated he feels like they just "played around" in PT at SNF); I believe it is worth considering CIR for intensive therapies to help get him back to independence; After CIR, it is possible that he can have more assist initially through the Home Instead Program available to [facility] pts with Medicare;  Pt will benefit from skilled PT to increase their independence and safety with mobility to allow discharge to the venue listed below.    Spoke with Dr. Sarajane Jews about getting a PRAFO with walking sole for R foot, and potential for CIR; have placed CIR screen to see what Admissions Coordinator thinks     Follow Up Recommendations CIR;Supervision/Assistance - 24 hour; After CIR, it is possible that he can have more assist at home initially through the Home Instead Program available to [facility] pts with Medicare    Equipment Recommendations  Rolling walker with 5" wheels;3in1 (PT)    Recommendations for Other Services Rehab consult(worth considering CIR)     Precautions / Restrictions Precautions Precautions: Fall Precaution Comments: reports he has a history of orthostatic hypotension Required Braces or Orthoses: Other Brace/Splint Other  Brace/Splint: Requested an order for [facility] with walking sole      Mobility  Bed Mobility Overal bed mobility: Needs Assistance Bed Mobility: Supine to Sit     Supine to sit: Min guard     General bed mobility comments: Cues for technqiue  Transfers Overall transfer level: Needs assistance Equipment used: Rolling walker (2 wheeled) Transfers: Sit to/from Stand Sit to Stand: Mod assist         General transfer comment: Light mod assist to power up; Needing mod assist to maintain balance in standing  Ambulation/Gait Ambulation/Gait assistance: Mod assist;+2 safety/equipment Ambulation Distance (Feet): (pivotal steps bed to recliner) Assistive device: Rolling walker (2 wheeled) Gait Pattern/deviations: Shuffle     General Gait Details: Cues to self-monitor for activity tolerance; Mod assist to maintain balance; very unsteady, with multidirectional sway  Stairs            Wheelchair Mobility    Modified Rankin (Stroke Patients Only)       Balance     Sitting balance-Leahy Scale: Fair       Standing balance-Leahy Scale: Zero(approaching poor)                               Pertinent Vitals/Pain Pain Assessment: Faces Faces Pain Scale: Hurts a little bit Pain Location: R foot when donning socks Pain Descriptors / Indicators: Grimacing Pain Intervention(s): Monitored during session    Home Living Family/patient expects to be discharged to:: Private residence Living Arrangements: Spouse/significant other Available Help at Discharge: Family(wife is elderly and limited ability to assist) Type of Home: House Home Access: Stairs  to enter Entrance Stairs-Rails: Right;Left;Can reach both Entrance Stairs-Number of Steps: 2 in front; 3 on side Home Layout: (3 steps between kitchen and dining room) Home Equipment: Walker - 4 wheels;Bedside commode;Shower seat;Wheelchair - manual      Prior Function Level of Independence: Needs assistance    Gait / Transfers Assistance Needed: Reports 2 people help him transfer from bed to wheelchair. However, then reported that he was ambulating short distances with assistance and RW  ADL's / Homemaking Assistance Needed: States "I do that pretty well" when asked about dressing and bathing tasks. However, later reporting that he has not been able to don/doff socks on his own for a long time.    Comments: Was completely independent, driving, walking without assistive device prior to inital injury that caused hematoma back in November; Pt has only been home from SNF for 1 day.      Hand Dominance        Extremity/Trunk Assessment   Upper Extremity Assessment Upper Extremity Assessment: Defer to OT evaluation    Lower Extremity Assessment Lower Extremity Assessment: Generalized weakness;RLE deficits/detail;LLE deficits/detail RLE Deficits / Details: Heel wound dressed; noted unable to acheive full knee extension LLE Deficits / Details: dressing over skin graft site; Unable to fully extend knee       Communication   Communication: No difficulties  Cognition Arousal/Alertness: Awake/alert Behavior During Therapy: WFL for tasks assessed/performed Overall Cognitive Status: Within Functional Limits for tasks assessed                                        General Comments General comments (skin integrity, edema, etc.):   04/25/17 1500 04/25/17 1501  Vital Signs  Patient Position (if appropriate) Orthostatic Vitals --   Orthostatic Lying   BP- Lying 97/54 --   Pulse- Lying 113 --   Orthostatic Sitting  BP- Sitting 119/89 (difficulty keeping arm still) (!) 116/98  Pulse- Sitting 114 107  Orthostatic Standing at 0 minutes  BP- Standing at 0 minutes (Attempted; unable to stand long enough to read BP) --        Exercises     Assessment/Plan    PT Assessment Patient needs continued PT services  PT Problem List Decreased strength;Decreased range of  motion;Decreased activity tolerance;Decreased balance;Decreased mobility;Decreased coordination;Decreased knowledge of use of DME;Decreased knowledge of precautions;Pain;Decreased skin integrity       PT Treatment Interventions DME instruction;Gait training;Functional mobility training;Therapeutic activities;Therapeutic exercise;Balance training;Patient/family education    PT Goals (Current goals can be found in the Care Plan section)  Acute Rehab PT Goals Patient Stated Goal: go home PT Goal Formulation: With patient Time For Goal Achievement: 05/09/17 Potential to Achieve Goals: Good    Frequency Min 3X/week   Barriers to discharge        Co-evaluation               AM-PAC PT "6 Clicks" Daily Activity  Outcome Measure Difficulty turning over in bed (including adjusting bedclothes, sheets and blankets)?: A Lot Difficulty moving from lying on back to sitting on the side of the bed? : A Lot Difficulty sitting down on and standing up from a chair with arms (e.g., wheelchair, bedside commode, etc,.)?: Unable Help needed moving to and from a bed to chair (including a wheelchair)?: A Lot Help needed walking in hospital room?: A Lot Help needed climbing 3-5 steps with a railing? : Total  6 Click Score: 10    End of Session Equipment Utilized During Treatment: Gait belt Activity Tolerance: Patient tolerated treatment well Patient left: in chair;with call bell/phone within reach Nurse Communication: Mobility status PT Visit Diagnosis: Other abnormalities of gait and mobility (R26.89);Muscle weakness (generalized) (M62.81);Ataxic gait (R26.0);Difficulty in walking, not elsewhere classified (R26.2)    Time: 2706-2376 PT Time Calculation (min) (ACUTE ONLY): 35 min   Charges:   PT Evaluation $PT Eval Moderate Complexity: 1 Mod PT Treatments $Therapeutic Activity: 8-22 mins   PT G Codes:        .ghs   Colletta [place] 04/25/2017, 5:08 PM

## 2017-04-25 NOTE — Progress Notes (Signed)
Orthopedic Tech Progress Note Patient Details:  Don Hall 03/29/1927 [id number]  Ortho Devices Type of Ortho Device: Prafo boot/shoe Ortho Device/Splint Location: Right Foot Ortho Device/Splint Interventions: Application   Post Interventions Patient Tolerated: Well Instructions Provided: Adjustment of device, Care of device   [name], [name] 04/25/2017, 6:56 PM

## 2017-04-25 NOTE — Progress Notes (Addendum)
Initial Nutrition Assessment  DOCUMENTATION CODES:   Not applicable  INTERVENTION:    Boost Plus po BID, each supplement provides 360 kcal and 14 grams of protein  NUTRITION DIAGNOSIS:   Increased nutrient needs related to wound healing as evidenced by estimated needs  GOAL:   Patient will meet greater than or equal to 90% of their needs  MONITOR:   PO intake, Supplement acceptance, Labs, Skin, Weight trends, I & O's  REASON FOR ASSESSMENT:   Consult Wound healing  ASSESSMENT:   84 yo Male who was just released from the nursing home day prior to admission after 78-month stay for treatment of skin graft after a large hematoma who presented for evaluation of reported loss of consciousness.  RD spoke with pt at bedside. Reports a good appetite. Ate ~75% of his lunch today. Typically consumes 3 meals per day at home.  Pt believes he's lost weight (20-25 lbs) since he was at "Rehab" in [place]. He reveals he was about 210 lbs before Christmas 2018. Likes Boost Plus nutrition supplements. RD to order.  Medications include Mag-Ox and Vitamin C. Labs reviewed. K 2.8 (L) 2/25. CBG's 131-109.  NUTRITION - FOCUSED PHYSICAL EXAM:  Unable to complete at this time.  Diet Order:  Seizure precautions Diet Heart Room service appropriate? Yes; Fluid consistency: Thin  EDUCATION NEEDS:   No education needs have been identified at this time  Skin:  Skin Assessment: Skin Integrity Issues: Skin Integrity Issues:: Unstageable, Other (Comment) Unstageable: R heel Other: skin graft to L leg  Last BM:  2/26   Intake/Output Summary (Last 24 hours) at 04/25/2017 1537 Last data filed at 04/25/2017 1100 Gross per 24 hour  Intake 120 ml  Output 200 ml  Net -80 ml   Height:   Ht Readings from Last 1 Encounters:  04/23/17 5\' 10"  (1.778 m)   Weight:   Wt Readings from Last 1 Encounters:  04/25/17 184 lb 1.4 oz (83.5 kg)   Wt Readings from Last 10 Encounters:  04/25/17 184 lb  1.4 oz (83.5 kg)  12/30/16 217 lb 3.2 oz (98.5 kg)  02/09/16 200 lb 6.4 oz (90.9 kg)  07/28/15 208 lb 6.4 oz (94.5 kg)  01/27/15 207 lb 6.4 oz (94.1 kg)  01/01/15 205 lb (93 kg)  10/24/14 206 lb (93.4 kg)  10/10/14 210 lb (95.3 kg)  08/18/14 214 lb (97.1 kg)  06/16/14 215 lb 1.9 oz (97.6 kg)   Ideal Body Weight:  75.4 kg  BMI:  Body mass index is 26.41 kg/m.  Estimated Nutritional Needs:   Kcal:  1850-2050  Protein:  90-105 gm  Fluid:  1.8-2.0 L  Arthur Holms, RD, LDN Pager #: [phone] After-Hours Pager #: [phone]

## 2017-04-25 NOTE — Progress Notes (Signed)
Bilateral carotid duplex completed. Bilateral - 1% to 39% ICA stenosis, Vertebral artery flow is antegrade, [facility], RVS 04/25/2017 4:24 PM

## 2017-04-26 DIAGNOSIS — E440 Moderate protein-calorie malnutrition: Secondary | ICD-10-CM

## 2017-04-26 DIAGNOSIS — N183 Chronic kidney disease, stage 3 (moderate): Secondary | ICD-10-CM

## 2017-04-26 DIAGNOSIS — I82403 Acute embolism and thrombosis of unspecified deep veins of lower extremity, bilateral: Secondary | ICD-10-CM

## 2017-04-26 DIAGNOSIS — D638 Anemia in other chronic diseases classified elsewhere: Secondary | ICD-10-CM

## 2017-04-26 DIAGNOSIS — I951 Orthostatic hypotension: Secondary | ICD-10-CM

## 2017-04-26 LAB — BASIC METABOLIC PANEL
Anion gap: 8 (ref 5–15)
BUN: 17 mg/dL (ref 6–20)
CALCIUM: 8.3 mg/dL — AB (ref 8.9–10.3)
CO2: 20 mmol/L — AB (ref 22–32)
CREATININE: 1.62 mg/dL — AB (ref 0.61–1.24)
Chloride: 112 mmol/L — ABNORMAL HIGH (ref 101–111)
GFR calc non Af Amer: 36 mL/min — ABNORMAL LOW (ref >60)
GFR, EST AFRICAN AMERICAN: 42 mL/min — AB (ref >60)
GLUCOSE: 135 mg/dL — AB (ref 65–99)
Potassium: 3.4 mmol/L — ABNORMAL LOW (ref 3.5–5.1)
Sodium: 140 mmol/L (ref 135–145)

## 2017-04-26 LAB — CBC
HEMATOCRIT: 28.9 % — AB (ref 39.0–52.0)
Hemoglobin: 9.3 g/dL — ABNORMAL LOW (ref 13.0–17.0)
MCH: 28.3 pg (ref 26.0–34.0)
MCHC: 32.2 g/dL (ref 30.0–36.0)
MCV: 87.8 fL (ref 78.0–100.0)
Platelets: 193 10*3/uL (ref 150–400)
RBC: 3.29 MIL/uL — AB (ref 4.22–5.81)
RDW: 17.7 % — AB (ref 11.5–15.5)
WBC: 7.8 10*3/uL (ref 4.0–10.5)

## 2017-04-26 LAB — GLUCOSE, CAPILLARY
GLUCOSE-CAPILLARY: 150 mg/dL — AB (ref 65–99)
Glucose-Capillary: 123 mg/dL — ABNORMAL HIGH (ref 65–99)
Glucose-Capillary: 133 mg/dL — ABNORMAL HIGH (ref 65–99)

## 2017-04-26 MED ORDER — WARFARIN - PHARMACIST DOSING INPATIENT: Freq: Every day | Status: DC

## 2017-04-26 MED ORDER — POTASSIUM CHLORIDE CRYS ER 20 MEQ PO TBCR: 40.0000 meq | EXTENDED_RELEASE_TABLET | Freq: Once | ORAL | Status: DC

## 2017-04-26 MED ORDER — DOXYCYCLINE HYCLATE 100 MG PO TABS: 100.0000 mg | ORAL_TABLET | Freq: Two times a day (BID) | ORAL | Status: DC

## 2017-04-26 MED ORDER — AMOXICILLIN-POT CLAVULANATE 875-125 MG PO TABS
1.0000 | ORAL_TABLET | Freq: Two times a day (BID) | ORAL | Status: DC
  Administered 2017-04-26 – 2017-04-27 (×2): 1 via ORAL
  Filled 2017-04-26 (×3): qty 1

## 2017-04-26 MED ORDER — AMOXICILLIN-POT CLAVULANATE 875-125 MG PO TABS: 1.0000 | ORAL_TABLET | Freq: Two times a day (BID) | ORAL | Status: DC

## 2017-04-26 MED ORDER — POTASSIUM CHLORIDE CRYS ER 20 MEQ PO TBCR
40.0000 meq | EXTENDED_RELEASE_TABLET | ORAL | Status: DC
  Administered 2017-04-26: 40 meq via ORAL
  Filled 2017-04-26: qty 2

## 2017-04-26 MED ORDER — DOXYCYCLINE HYCLATE 100 MG PO TABS
100.0000 mg | ORAL_TABLET | Freq: Two times a day (BID) | ORAL | Status: DC
  Administered 2017-04-26 – 2017-04-27 (×2): 100 mg via ORAL
  Filled 2017-04-26 (×3): qty 1

## 2017-04-26 MED ORDER — ADULT MULTIVITAMIN W/MINERALS CH
1.0000 | ORAL_TABLET | Freq: Every day | ORAL | Status: DC
  Administered 2017-04-26: 1 via ORAL
  Filled 2017-04-26: qty 1

## 2017-04-26 MED ORDER — POTASSIUM CHLORIDE 20 MEQ PO PACK
40.0000 meq | PACK | Freq: Once | ORAL | Status: AC
  Administered 2017-04-26: 40 meq via ORAL
  Filled 2017-04-26: qty 2

## 2017-04-26 MED ORDER — WARFARIN SODIUM 5 MG PO TABS
5.0000 mg | ORAL_TABLET | Freq: Once | ORAL | Status: AC
  Administered 2017-04-26: 5 mg via ORAL
  Filled 2017-04-26: qty 1

## 2017-04-26 NOTE — Progress Notes (Signed)
PROGRESS NOTE        PATIENT DETAILS Name: Don Hall. Age: 84 y.o. Sex: male Date of Birth: 28-Apr-1927 Admit Date: 04/23/2017 Admitting Physician Samuella Cota, MD HQI:ONGEXB, Mitzie Na, MD  Brief Narrative: Patient is a 84 y.o. male history of atrial fibrillation on anticoagulation, DM-2, chronic kidney disease stage III-who was just released from a skilled nursing facility 1 day prior to this admission after a 1-month stay for treatment of skin graft after a large hematoma presented to the hospital for evaluation of a syncopal episode, he was also found to have occasional myoclonus jerks, and thought to have sepsis secondary to right heel wound infection.  Admitted and provided supportive care-clinically significantly improved.  See below for further details  Subjective: Lying comfortably in bed  Assessment/Plan: Sepsis secondary to right heel wound infection: Sepsis pathophysiology has resolved, cultures remain negative-wound on my exam does not look infected-we will transition to oral doxycycline and Augmentin.  Syncope: Thought to be secondary to orthostatic hypotension-treated with IV fluids.  Continue midodrine.  Echocardiogram with preserved EF.  Myoclonic jerks: Resolved-Zoloft has been resumed.  DM-2: Diet controlled, CBG stable with SSI.  A1c 6.4  Normocytic anemia: Appears to be chronic-stable for outpatient follow-up  Hypokalemia: Replete and recheck  Chronic kidney disease stage III: Creatinine appears to be close to usual baseline, follow periodically  History of multiple DVTs: On warfarin until hospitalization in November-coagulation was discontinued as he had a large hematoma in his left lower extremity-previous MD (Dr. Sarajane Jews) spoke with patient on 2/26-patient desired to be restarted on anticoagulation given his prior history of venous thromboembolism, I subsequently spoke with patient today and reconfirmed-hence we will go  ahead and start Coumadin-continue with overlapping Lovenox.    Paroxysmal atrial fibrillation: Rate controlled without the use of any rate limiting agents-currently on Lovenox-with plans to start Coumadin 2/27.  Right heel wound: Present prior to admission-unstageable-did not see any evidence of drainage on my exam.  Some yellowish slough present.  History of large left lower extremity wound due to hematoma-status post skin graft January 2019: Dressing in place-skin graft appears to be intact   Moderate malnutrition: Continue supplements  DVT Prophylaxis: Overlapping Lovenox and Coumadin  Code Status: Full code   Family Communication: None at bedside  Disposition Plan: Remain inpatient-  Antimicrobial agents: Anti-infectives (From admission, onward)   Start     Dose/Rate Route Frequency Ordered Stop   04/26/17 1000  doxycycline (VIBRA-TABS) tablet 100 mg  Status:  Discontinued     100 mg Oral Every 12 hours 04/25/17 1225 04/25/17 1231   04/25/17 1500  vancomycin (VANCOCIN) IVPB 1000 mg/200 mL premix     1,000 mg 200 mL/hr over 60 Minutes Intravenous Every 24 hours 04/25/17 1232     04/25/17 1230  doxycycline (VIBRA-TABS) tablet 100 mg  Status:  Discontinued     100 mg Oral Every 12 hours 04/25/17 1225 04/25/17 1225   04/24/17 1400  metroNIDAZOLE (FLAGYL) tablet 500 mg     500 mg Oral Every 8 hours 04/24/17 1107     04/24/17 1300  vancomycin (VANCOCIN) IVPB 1000 mg/200 mL premix  Status:  Discontinued     1,000 mg 200 mL/hr over 60 Minutes Intravenous Every 24 hours 04/23/17 1341 04/25/17 1224   04/24/17 1115  cefTRIAXone (ROCEPHIN) 2 g in sodium chloride 0.9 %  100 mL IVPB     2 g 200 mL/hr over 30 Minutes Intravenous Every 24 hours 04/24/17 1107     04/23/17 2100  piperacillin-tazobactam (ZOSYN) IVPB 3.375 g  Status:  Discontinued     3.375 g 12.5 mL/hr over 240 Minutes Intravenous Every 8 hours 04/23/17 1341 04/24/17 1107   04/23/17 1245  vancomycin (VANCOCIN) 1,750 mg in  sodium chloride 0.9 % 500 mL IVPB     1,750 mg 250 mL/hr over 120 Minutes Intravenous  Once 04/23/17 1242 04/23/17 1504   04/23/17 1230  piperacillin-tazobactam (ZOSYN) IVPB 3.375 g     3.375 g 100 mL/hr over 30 Minutes Intravenous  Once 04/23/17 1222 04/23/17 1331   04/23/17 1230  vancomycin (VANCOCIN) IVPB 1000 mg/200 mL premix  Status:  Discontinued     1,000 mg 200 mL/hr over 60 Minutes Intravenous  Once 04/23/17 1222 04/23/17 1242      Procedures: None  CONSULTS:  None  Time spent: 25- minutes-Greater than 50% of this time was spent in counseling, explanation of diagnosis, planning of further management, and coordination of care.  MEDICATIONS: Scheduled Meds: . atorvastatin  10 mg Oral QPM  . calcium carbonate  1 tablet Oral BID  . cholecalciferol  400 Units Oral BID  . collagenase   Topical Daily  . enoxaparin (LOVENOX) injection  1 mg/kg Subcutaneous Q24H  . insulin aspart  0-9 Units Subcutaneous TID WC  . iron polysaccharides  150 mg Oral BID  . lactose free nutrition  237 mL Oral BID BM  . magnesium oxide  400 mg Oral BID  . metroNIDAZOLE  500 mg Oral Q8H  . midodrine  5 mg Oral BID WC  . multivitamin with minerals  1 tablet Oral Daily  . neomycin-bacitracin-polymyxin   Topical Daily  . pantoprazole  40 mg Oral Daily  . potassium chloride  40 mEq Oral Q4H  . sertraline  100 mg Oral QPM  . vitamin C  500 mg Oral Daily  . warfarin  5 mg Oral ONCE-1800  . Warfarin - Pharmacist Dosing Inpatient   Does not apply q1800   Continuous Infusions: . cefTRIAXone (ROCEPHIN)  IV Stopped (04/26/17 1115)  . vancomycin Stopped (04/25/17 1644)   PRN Meds:.acetaminophen **OR** acetaminophen, bisacodyl, HYDROcodone-acetaminophen, ondansetron **OR** ondansetron (ZOFRAN) IV, polyvinyl alcohol, senna-docusate   PHYSICAL EXAM: Vital signs: Vitals:   04/25/17 0409 04/25/17 2106 04/26/17 0523 04/26/17 1333  BP: (!) 116/57 (!) 157/56 (!) 154/60 (!) 134/49  Pulse: (!) 57 (!) 102  98 91  Resp: 18 15 16 16   Temp: 97.6 F (36.4 C) 98.2 F (36.8 C) 98 F (36.7 C) (!) 97.5 F (36.4 C)  TempSrc: Oral Oral Oral Oral  SpO2: 98% 100%  100%  Weight: 83.5 kg (184 lb 1.4 oz)  81.6 kg (179 lb 14.3 oz)   Height:       Filed Weights   04/23/17 1233 04/25/17 0409 04/26/17 0523  Weight: 84.1 kg (185 lb 8 oz) 83.5 kg (184 lb 1.4 oz) 81.6 kg (179 lb 14.3 oz)   Body mass index is 25.81 kg/m.   General appearance :Awake, alert, not in any distress.  Eyes:, pupils equally reactive to light and accomodation,no scleral icterus. HEENT: Atraumatic and Normocephalic Neck: supple, no JVD. No cervical lymphadenopathy. No thyromegaly Resp:Good air entry bilaterally, no added sounds  CVS: S1 S2 regular, no murmurs.  GI: Bowel sounds present, Non tender and not distended with no gaurding, rigidity or rebound.No organomegaly Extremities: B/L Lower Ext  shows no edema, both legs are warm to touch Neurology:  speech clear,Non focal, sensation is grossly intact. Psychiatric: Normal judgment and insight. Alert and oriented x 3. Normal mood. Musculoskeletal:No digital cyanosis Skin:No Rash, warm and dry Wounds: Right heel wound: Approximately 1.5 x 1.5 cm-no discharge or drainage present.  I have personally reviewed following labs and imaging studies  LABORATORY DATA: CBC: Recent Labs  Lab 04/23/17 1143 04/24/17 0207 04/26/17 0554  WBC 13.7* 11.0* 7.8  NEUTROABS 10.0  --   --   HGB 12.6* 9.7* 9.3*  HCT 40.2 30.4* 28.9*  MCV 88.5 85.2 87.8  PLT 295 218 161    Basic Metabolic Panel: Recent Labs  Lab 04/23/17 1120 04/24/17 0207 04/26/17 0554  NA 143 141 140  K 3.2* 2.8* 3.4*  CL 107 113* 112*  CO2 20* 18* 20*  GLUCOSE 258* 158* 135*  BUN 35* 28* 17  CREATININE 1.94* 1.56* 1.62*  CALCIUM 9.6 8.1* 8.3*    GFR: Estimated Creatinine Clearance: 32.5 mL/min (A) (by C-G formula based on SCr of 1.62 mg/dL (H)).  Liver Function Tests: Recent Labs  Lab 04/23/17 1120    AST 32  ALT 26  ALKPHOS 101  BILITOT 0.6  PROT 7.0  ALBUMIN 3.4*   No results for input(s): LIPASE, AMYLASE in the last 168 hours. No results for input(s): AMMONIA in the last 168 hours.  Coagulation Profile: Recent Labs  Lab 04/23/17 1351 04/24/17 0207  INR 1.23 1.44    Cardiac Enzymes: No results for input(s): CKTOTAL, CKMB, CKMBINDEX, TROPONINI in the last 168 hours.  BNP (last 3 results) No results for input(s): PROBNP in the last 8760 hours.  HbA1C: Recent Labs    04/23/17 1800  HGBA1C 6.4*    CBG: Recent Labs  Lab 04/25/17 0812 04/25/17 1718 04/25/17 2155 04/26/17 0803 04/26/17 1159  GLUCAP 109* 188* 170* 123* 133*    Lipid Profile: No results for input(s): CHOL, HDL, LDLCALC, TRIG, CHOLHDL, LDLDIRECT in the last 72 hours.  Thyroid Function Tests: No results for input(s): TSH, T4TOTAL, FREET4, T3FREE, THYROIDAB in the last 72 hours.  Anemia Panel: No results for input(s): VITAMINB12, FOLATE, FERRITIN, TIBC, IRON, RETICCTPCT in the last 72 hours.  Urine analysis:    Component Value Date/Time   COLORURINE YELLOW 04/23/2017 1220   APPEARANCEUR HAZY (A) 04/23/2017 1220   LABSPEC 1.015 04/23/2017 1220   PHURINE 5.0 04/23/2017 1220   GLUCOSEU 50 (A) 04/23/2017 1220   HGBUR MODERATE (A) 04/23/2017 1220   BILIRUBINUR NEGATIVE 04/23/2017 1220   KETONESUR NEGATIVE 04/23/2017 1220   PROTEINUR NEGATIVE 04/23/2017 1220   UROBILINOGEN 0.2 02/08/2011 1428   NITRITE NEGATIVE 04/23/2017 1220   LEUKOCYTESUR LARGE (A) 04/23/2017 1220    Sepsis Labs: Lactic Acid, Venous    Component Value Date/Time   LATICACIDVEN 2.1 (HH) 04/23/2017 2340    MICROBIOLOGY: Recent Results (from the past 240 hour(s))  Blood Culture (routine x 2)     Status: None (Preliminary result)   Collection Time: 04/23/17 12:25 PM  Result Value Ref Range Status   Specimen Description BLOOD RIGHT ANTECUBITAL  Final   Special Requests   Final    BOTTLES DRAWN AEROBIC AND ANAEROBIC  Blood Culture adequate volume   Culture   Final    NO GROWTH 3 DAYS Performed at [facility], [place] [address] [place], [place] [address]    Report Status PENDING  Incomplete  Blood Culture (routine x 2)     Status: None (Preliminary result)  Collection Time: 04/23/17 12:52 PM  Result Value Ref Range Status   Specimen Description BLOOD LEFT FOREARM  Final   Special Requests   Final    BOTTLES DRAWN AEROBIC AND ANAEROBIC Blood Culture adequate volume   Culture   Final    NO GROWTH 3 DAYS Performed at [facility], [address], [place], [place] [address]    Report Status PENDING  Incomplete  Urine culture     Status: Abnormal   Collection Time: 04/23/17  3:13 PM  Result Value Ref Range Status   Specimen Description URINE, CLEAN CATCH  Final   Special Requests   Final    NONE Performed at [facility], [place] [address], [place], [place] [address]    Culture MULTIPLE SPECIES PRESENT, SUGGEST RECOLLECTION (A)  Final   Report Status 04/24/2017 FINAL  Final    RADIOLOGY STUDIES/RESULTS: Ct Head Wo Contrast  Result Date: 04/23/2017 CLINICAL DATA:  Syncopal episode at home.  Seizure-like activity. EXAM: CT HEAD WITHOUT CONTRAST CT CERVICAL SPINE WITHOUT CONTRAST TECHNIQUE: Multidetector CT imaging of the head and cervical spine was performed following the standard protocol without intravenous contrast. Multiplanar CT image reconstructions of the cervical spine were also generated. COMPARISON:  04/19/2016 FINDINGS: CT HEAD FINDINGS Brain: No evidence of acute infarction, hemorrhage, hydrocephalus, extra-axial collection or mass lesion/mass effect. There is ventricular greater than sulcal enlargement consistent with atrophy, stable from the prior CT. Mild periventricular white matter hypoattenuation is also noted consistent with chronic microvascular ischemic change, also stable. Vascular: No hyperdense vessel or unexpected calcification. Skull: Normal. Negative for fracture  or focal lesion. Sinuses/Orbits: Globes and orbits are unremarkable. Mild left maxillary sinus mucosal thickening. Sinuses otherwise clear. Clear mastoid air cells. Other: None. CT CERVICAL SPINE FINDINGS Alignment: Slight degenerative anterolisthesis of C4 on C5. No other spondylolisthesis. Skull base and vertebrae: No acute fracture. No primary bone lesion or focal pathologic process. Soft tissues and spinal canal: No prevertebral fluid or swelling. No visible canal hematoma. Disc levels: Mild loss disc height at C3-C4-C4-C5. Moderate loss disc height at C5-C6, C6-C7 and C7-T1. There are facet degenerative changes bilaterally greatest at C3-C4, C4-C5. Spondylotic disc bulging is noted from C3-C4 through C6-C7. Uncovertebral spurring causes neural foraminal narrowing, moderate bilaterally at C 5-C6 and C6-C7. No convincing disc herniation. Upper chest: No masses or adenopathy. No acute findings. Lung apices are clear. Other: None IMPRESSION: HEAD CT 1. No acute intracranial abnormalities. 2. Atrophy and chronic microvascular ischemic change. CERVICAL CT 1. No fracture or acute finding. Electronically Signed   By: Lajean Manes M.D.   On: 04/23/2017 15:09   Ct Cervical Spine Wo Contrast  Result Date: 04/23/2017 CLINICAL DATA:  Syncopal episode at home.  Seizure-like activity. EXAM: CT HEAD WITHOUT CONTRAST CT CERVICAL SPINE WITHOUT CONTRAST TECHNIQUE: Multidetector CT imaging of the head and cervical spine was performed following the standard protocol without intravenous contrast. Multiplanar CT image reconstructions of the cervical spine were also generated. COMPARISON:  04/19/2016 FINDINGS: CT HEAD FINDINGS Brain: No evidence of acute infarction, hemorrhage, hydrocephalus, extra-axial collection or mass lesion/mass effect. There is ventricular greater than sulcal enlargement consistent with atrophy, stable from the prior CT. Mild periventricular white matter hypoattenuation is also noted consistent with  chronic microvascular ischemic change, also stable. Vascular: No hyperdense vessel or unexpected calcification. Skull: Normal. Negative for fracture or focal lesion. Sinuses/Orbits: Globes and orbits are unremarkable. Mild left maxillary sinus mucosal thickening. Sinuses otherwise clear. Clear mastoid air cells. Other: None.  CT CERVICAL SPINE FINDINGS Alignment: Slight degenerative anterolisthesis of C4 on C5. No other spondylolisthesis. Skull base and vertebrae: No acute fracture. No primary bone lesion or focal pathologic process. Soft tissues and spinal canal: No prevertebral fluid or swelling. No visible canal hematoma. Disc levels: Mild loss disc height at C3-C4-C4-C5. Moderate loss disc height at C5-C6, C6-C7 and C7-T1. There are facet degenerative changes bilaterally greatest at C3-C4, C4-C5. Spondylotic disc bulging is noted from C3-C4 through C6-C7. Uncovertebral spurring causes neural foraminal narrowing, moderate bilaterally at C 5-C6 and C6-C7. No convincing disc herniation. Upper chest: No masses or adenopathy. No acute findings. Lung apices are clear. Other: None IMPRESSION: HEAD CT 1. No acute intracranial abnormalities. 2. Atrophy and chronic microvascular ischemic change. CERVICAL CT 1. No fracture or acute finding. Electronically Signed   By: Lajean Manes M.D.   On: 04/23/2017 15:09   Dg Chest Portable 1 View  Result Date: 04/23/2017 CLINICAL DATA:  Near syncope. EXAM: PORTABLE CHEST 1 VIEW COMPARISON:  January 20, 2017 FINDINGS: The heart size and mediastinal contours are within normal limits. Both lungs are clear. The visualized skeletal structures are unremarkable. IMPRESSION: No active disease. Electronically Signed   By: Dorise Bullion III M.D   On: 04/23/2017 12:01     LOS: 3 days   Oren Binet, MD  [facility] Pager:[phone]  If 7PM-7AM, please contact night-coverage [email or web address] Password [facility] 04/26/2017, 2:31 PM

## 2017-04-26 NOTE — NC FL2 (Signed)
[place] LEVEL OF CARE SCREENING TOOL     IDENTIFICATION  Patient Name: Don Hall. Birthdate: Apr 14, 1927 Sex: male Admission Date (Current Location): 04/23/2017  [place] and [place] Number:  [employer] and Address:  [facility] [place], [place] [address] [place], [place] [address]      Provider Number: [id number]  Attending Physician Name and Address:  Jonetta Osgood, MD  Relative Name and Phone Number:       Current Level of Care: Hospital Recommended Level of Care: [place] Prior Approval Number:    Date Approved/Denied:   PASRR Number: [id number]  Discharge Plan: SNF    Current Diagnoses: Patient Active Problem List   Diagnosis Date Noted  . Malnutrition of moderate degree 04/26/2017  . Anemia of chronic disease 04/24/2017  . DVT (deep venous thrombosis) ([place]) 04/24/2017  . Chronic kidney disease, stage 3 ([place]) 04/23/2017  . Sepsis ([place]) 04/23/2017  . Myoclonus 04/23/2017  . Generalized weakness 04/23/2017  . Pre-syncope 04/23/2017  . Heel ulcer ([place]) 04/23/2017  . Orthostatic hypotension 04/23/2017  . Varicose veins of left lower extremity with ulcer of calf ([place]) 01/27/2015  . Atherosclerosis of artery of extremity with ulceration ([place]) 10/24/2014  . Diarrhea 06/16/2014  . Shortness of breath 10/09/2013  . GERD (gastroesophageal reflux disease) 04/25/2011  . Long term (current) use of anticoagulants 06/07/2010  . DEEP VENOUS THROMBOPHLEBITIS, RECURRENT 03/08/2010  . Hyperlipemia 12/15/2008  . Essential hypertension, benign 12/15/2008  . Paroxysmal atrial fibrillation ([place]) 12/15/2008    Orientation RESPIRATION BLADDER Height & Weight     Self, Time, Situation, Place  Normal Continent Weight: 179 lb 14.3 oz (81.6 kg) Height:  5\' 10"  (177.8 cm)  BEHAVIORAL SYMPTOMS/MOOD NEUROLOGICAL BOWEL NUTRITION STATUS      Continent Diet(see discharge summary )  AMBULATORY STATUS COMMUNICATION OF  NEEDS Skin   Extensive Assist Verbally PU Stage and Appropriate Care, Surgical wounds(left leg wound daily dressing; right leg incision daily dressing; unstageable wound on R heel foam dressing change every 3 days)                       Personal Care Assistance Level of Assistance  Bathing, Feeding, Dressing Bathing Assistance: Maximum assistance Feeding assistance: Independent Dressing Assistance: Maximum assistance     Functional Limitations Info  Sight, Hearing, Speech Sight Info: Adequate(wears glasses) Hearing Info: Impaired(wears hearing aids) Speech Info: Adequate    SPECIAL CARE FACTORS FREQUENCY  PT (By licensed PT), OT (By licensed OT)     PT Frequency: 5x week OT Frequency: 5x week            Contractures Contractures Info: Not present    Additional Factors Info  Code Status; Psychotropic Medications Code Status Info: Full Code  Psychotropic Medications: Zoloft, 100mg  PO every evening           Current Medications (04/26/2017):  This is the current hospital active medication list Current Facility-Administered Medications  Medication Dose Route Frequency Provider Last Rate Last Dose  . acetaminophen (TYLENOL) tablet 650 mg  650 mg Oral Q6H PRN Sharene Butters E, PA-C       Or  . acetaminophen (TYLENOL) suppository 650 mg  650 mg Rectal Q6H PRN Rondel Jumbo, PA-C      . amoxicillin-clavulanate (AUGMENTIN) 875-125 MG per tablet 1 tablet  1 tablet Oral Q12H Ghimire, Shanker M, MD      . atorvastatin (LIPITOR) tablet 10 mg  10 mg Oral QPM Rondel Jumbo, PA-C   10 mg at 04/25/17 1833  . bisacodyl (DULCOLAX) suppository 10 mg  10 mg Rectal Daily PRN Rondel Jumbo, PA-C      . calcium carbonate (TUMS - dosed in mg elemental calcium) chewable tablet 200 mg of elemental calcium  1 tablet Oral BID Rondel Jumbo, PA-C   200 mg of elemental calcium at 04/26/17 0911  . cholecalciferol (VITAMIN D) tablet 400 Units  400 Units Oral BID Rondel Jumbo, PA-C   400  Units at 04/26/17 3149  . collagenase (SANTYL) ointment   Topical Daily Samuella Cota, MD      . doxycycline (VIBRA-TABS) tablet 100 mg  100 mg Oral Q12H Ghimire, Shanker M, MD      . enoxaparin (LOVENOX) injection 85 mg  1 mg/kg Subcutaneous Q24H Samuella Cota, MD   85 mg at 04/26/17 1045  . HYDROcodone-acetaminophen (NORCO/VICODIN) 5-325 MG per tablet 1 tablet  1 tablet Oral Q4H PRN Rondel Jumbo, PA-C   1 tablet at 04/23/17 2312  . insulin aspart (novoLOG) injection 0-9 Units  0-9 Units Subcutaneous TID WC Samuella Cota, MD   1 Units at 04/26/17 1247  . iron polysaccharides (NIFEREX) capsule 150 mg  150 mg Oral BID Rondel Jumbo, PA-C   150 mg at 04/26/17 7026  . lactose free nutrition (Boost) liquid 237 mL  237 mL Oral BID BM Samuella Cota, MD   237 mL at 04/26/17 1450  . magnesium oxide (MAG-OX) tablet 400 mg  400 mg Oral BID Rondel Jumbo, PA-C   400 mg at 04/26/17 3785  . midodrine (PROAMATINE) tablet 5 mg  5 mg Oral BID WC Sharene Butters E, PA-C   5 mg at 04/26/17 0905  . multivitamin with minerals tablet 1 tablet  1 tablet Oral Daily Jonetta Osgood, MD   1 tablet at 04/26/17 1424  . neomycin-bacitracin-polymyxin (NEOSPORIN) ointment   Topical Daily Samuella Cota, MD      . ondansetron [facility]) tablet 4 mg  4 mg Oral Q6H PRN Rondel Jumbo, PA-C       Or  . ondansetron [facility]) injection 4 mg  4 mg Intravenous Q6H PRN Rondel Jumbo, PA-C      . pantoprazole (PROTONIX) EC tablet 40 mg  40 mg Oral Daily Rondel Jumbo, PA-C   40 mg at 04/26/17 0909  . polyvinyl alcohol (LIQUIFILM TEARS) 1.4 % ophthalmic solution 1 drop  1 drop Both Eyes Daily PRN Shawn Route, Sara E, PA-C      . potassium chloride (KLOR-CON) packet 40 mEq  40 mEq Oral Once Jonetta Osgood, MD      . senna-docusate (Senokot-S) tablet 1 tablet  1 tablet Oral QHS PRN Rondel Jumbo, PA-C      . sertraline (ZOLOFT) tablet 100 mg  100 mg Oral QPM Samuella Cota, MD   100 mg at 04/25/17  1833  . vitamin C (ASCORBIC ACID) tablet 500 mg  500 mg Oral Daily Rondel Jumbo, PA-C   500 mg at 04/26/17 8850  . warfarin (COUMADIN) tablet 5 mg  5 mg Oral ONCE-1800 Bajbus, Lauren D, RPH      . Warfarin - Pharmacist Dosing Inpatient   Does not apply q1800 Bajbus, Almeta Monas, [facility]         Discharge Medications: Please see discharge summary for a list of discharge medications.  Relevant Imaging Results:  Relevant Lab  Results:   Additional Information SS# [place], [place]

## 2017-04-26 NOTE — Progress Notes (Signed)
I have notified RN CM, Heather, That we are deferring consult. Recommend pt return to SNF. We will sign off at this time. [phone]

## 2017-04-26 NOTE — Social Work (Addendum)
CSW met at bedside with pt, pt has recommendations from PT/OT for SNF placement.  Pt states that he had used up all of his Medicare days at [facility] and that they were going to have to private pay for continued care which pt states he is not willing to do. Pt told CSW to call wife to discuss next steps.   Pt wife states that pt was supposed to start with [facility] after discharging from [facility], but that pt was only home for a day so he was not able to receive those services.   CSW alerted RN Case Manager.  3:39pm- CSW reconsulted for SNF placement, aware that pt is amenable to possibly private paying.   [place], [place] Work [phone]

## 2017-04-26 NOTE — Progress Notes (Signed)
Physical medicine rehabilitation consult requested chart reviewed. Presented to [facility] 04/23/2017 after 3 months of skilled nursing facility for cellulitis large heel wound/skin graft. He had been home for 1 day for admission to the hospital with syncope and altered mental status. Wife is elderly and limited ability to assist. Therapy evaluations initiated 04/25/2017. At this time will defer consult and follow from a distance and await to establish then use of care. Patient most likely will need to return back to skilled nursing facility

## 2017-04-26 NOTE — Care Management Note (Addendum)
Case Management Note  Patient Details  Name: Don Hall. MRN: [id number] Date of Birth: 04/05/1927  Subjective/Objective:                    Action/Plan:Update, patient consented for SW and NCM to call his son Don Hall to discuss home health vs SNF at discharge . Same done. Don Hall will talk with his parents tonight and discuss options. At present Don Hall believes best option may be home with home health. Will continue to follow. Patient deferred decisions to wife.  Spoke to patient's wife Don Hall via phone [phone] regarding discharge planning. Patient and wife aware patient has no more Medicare days for SNF. Patient was home one day between SNF and being admitted to hospital , they were set up with [place] , however, patient came to hospital before [facility] could see patient at home. Wife would like [place] arranged. Referral given to Neoma Laming with [facility] and accepted . Patient already has 3 in 1 and walker at home. Patient is on room air.   Wife states son can transport patient home.   48 Wife called back stated patient now wants to stay at hospital to receive therapy . Explained patient needs medical reason to stay at hospital . Spoke with patient and explained process of appealing discharge . Patient now wants to speak with social work regarding private pay at a SNF. SW aware. Expected Discharge Date:                  Expected Discharge Plan:  [place]  In-House Referral:     Discharge planning Services  CM Consult  Post Acute Care Choice:  Home Health, Durable Medical Equipment Choice offered to:  Patient, Spouse  DME Arranged:  N/A DME Agency:  NA  HH Arranged:  RN, PT, OT, Nurse's Aide, Social Work [employer] Agency:  [place]  Status of Service:  Completed, signed off  If discussed at [employer], dates discussed:    Additional Comments:  Marilu Favre, RN 04/26/2017, 3:25 PM

## 2017-04-26 NOTE — Progress Notes (Signed)
ANTICOAGULATION CONSULT NOTE  Pharmacy Consult for Lovenox + warfarin Indication: atrial fibrillation and DVT  Patient Measurements: Height: 5\' 10"  (177.8 cm) Weight: 179 lb 14.3 oz (81.6 kg) IBW/kg (Calculated) : 73 Heparin Dosing Weight: 84 kg   Vital Signs: Temp: 98 F (36.7 C) (02/27 0523) Temp Source: Oral (02/27 0523) BP: 154/60 (02/27 0523) Pulse Rate: 98 (02/27 0523)  Labs: Recent Labs    04/23/17 1120  04/23/17 1143 04/23/17 1351 04/24/17 0155 04/24/17 0207 04/26/17 0554  HGB  --    < > 12.6*  --   --  9.7* 9.3*  HCT  --   --  40.2  --   --  30.4* 28.9*  PLT  --   --  295  --   --  218 193  LABPROT  --   --   --  15.4*  --  17.4*  --   INR  --   --   --  1.23  --  1.44  --   HEPARINUNFRC  --   --   --   --  1.12*  --   --   CREATININE 1.94*  --   --   --   --  1.56* 1.62*   < > = values in this interval not displayed.    Assessment: 11 YOM with a history of afib and multiple DVTs who is not anticoagulated by choice (was previously on warfarin 5mg  daily but has not taken that for a few months, last took prior to hospitalization in November).  Patient with overall stable renal function, though it is borderline ~76mL/min. CBC also stable, no bleeding noted.  To start warfarin today. Would continue Lovenox until INR is within range. CHADSVASC = 4 Noted in patient's PMH that his last DVT was in 2011, but he has had 4 DVTs as well as history of bleeding (spontaneous hemorrhage of thigh and hematoma).  Goal of Therapy:  Heparin level 0.3-0.7 units/ml Monitor platelets by anticoagulation protocol: Yes   Plan:  Lovenox 1mg /kg [place] q24h, keep q24h for now with age and borderline CrCl Warfarin 5mg  po x1 tonight   Emmanuella Mirante D. Adamarie Izzo, PharmD, BCPS Clinical Pharmacist Clinical Phone for 04/26/2017 until 3:30pm: [phone] If after 3:30pm, please call main pharmacy at [phone] 04/26/2017 10:39 AM

## 2017-04-26 NOTE — Progress Notes (Signed)
Nutrition Follow-up  DOCUMENTATION CODES:   Non-severe (moderate) malnutrition in context of chronic illness  INTERVENTION:   -Continue Boost Plus BID, each supplement provides 360 kcals and 16 grams protein -MVI daily  NUTRITION DIAGNOSIS:   Moderate Malnutrition related to chronic illness(DM) as evidenced by energy intake < 75% for > or equal to 1 month, mild fat depletion, moderate fat depletion, mild muscle depletion, moderate muscle depletion, percent weight loss.  Ongoing  GOAL:   Patient will meet greater than or equal to 90% of their needs  Progressing  MONITOR:   PO intake, Supplement acceptance, Labs, Weight trends, Skin, I & O's  REASON FOR ASSESSMENT:   Consult Wound healing  ASSESSMENT:   84 yo Male who was just released from the nursing home day prior to admission after 45-month stay for treatment of skin graft after a large hematoma who presented for evaluation of reported loss of consciousness.  Spoke with pt at bedside, who reports appetite was presently good. Noted meal completion 75-100%. Pt shares he did not eat much breakfast this morning, due to cold temperature.   Pt reports "a lot" of weight loss PTA, which he attributes to poor oral intake at SNF. Noted UBW around 205-217#. Noted pt has experienced a 38# (17.5%) wt loss over the past 3 months, which is significant for time frame.   Pt eager for wound care consult. Discussed importance of good meal and supplement intake to promote healing. Pt requesting to continue Boost supplement.   Pt with excellent glycemic control PTA (last Hgb A1c: 6.4 on 04/23/17). Pt on no DM medications PTA.  Labs reviewed: K: 3.4, CBGS: 123-188 (inpatient orders for glycemic control are 0-9 units insulin aspart TID with meals).   NUTRITION - FOCUSED PHYSICAL EXAM:    Most Recent Value  Orbital Region  Moderate depletion  Upper Arm Region  Mild depletion  Thoracic and Lumbar Region  Mild depletion  Buccal Region  Mild  depletion  Temple Region  Moderate depletion  Clavicle Bone Region  Mild depletion  Clavicle and Acromion Bone Region  Mild depletion  Scapular Bone Region  Mild depletion  Dorsal Hand  Moderate depletion  Patellar Region  Mild depletion  Anterior Thigh Region  Mild depletion  Posterior Calf Region  Mild depletion  Edema (RD Assessment)  None  Hair  Reviewed  Eyes  Reviewed  Mouth  Reviewed  Skin  Reviewed  Nails  Reviewed       Diet Order:  Seizure precautions Diet Heart Room service appropriate? Yes; Fluid consistency: Thin  EDUCATION NEEDS:   Education needs have been addressed  Skin:  Skin Assessment: Skin Integrity Issues: Skin Integrity Issues:: Unstageable, Other (Comment) Unstageable: R heel Other: skin graft to L leg  Last BM:  04/26/17  Height:   Ht Readings from Last 1 Encounters:  04/23/17 5\' 10"  (1.778 m)    Weight:   Wt Readings from Last 1 Encounters:  04/26/17 179 lb 14.3 oz (81.6 kg)    Ideal Body Weight:  75.4 kg  BMI:  Body mass index is 25.81 kg/m.  Estimated Nutritional Needs:   Kcal:  2000-2200  Protein:  105-120 grams  Fluid:  > 2.0 L    Ashari Llewellyn A. Jimmye Norman, RD, LDN, CDE Pager: [phone] After hours Pager: [phone]

## 2017-04-26 NOTE — Progress Notes (Signed)
Physical Therapy Treatment Patient Details Name: Don Hall. MRN: [id number] DOB: 12-16-1927 Today's Date: 04/26/2017    History of Present Illness 84 year old gentleman presents with cellulitis due to a large Right heel wound.  He was discharged from skilled nursing facility yesterday after 3 months at facility for rehab after a skin graft due to a large hematoma.    PT Comments    Continuing work on functional mobility and activity tolerance;  Able to incr amb distance, and noted better bed mobility; still needing assist to power up to stand; Noted CIR is deferring consult; will update to SNF for post-acute rehab; Worth also considering how [facility] can fit into his care -- would they be able to initiate that program after dc from SNF to maximize post-acute rehab options?  Remember: he was independent prior to November 5th last year    Follow Up Recommendations  SNF;Supervision/Assistance - 24 hour;Other (comment)(Consider also [facility] )     Equipment Recommendations  Rolling walker with 5" wheels;3in1 (PT)    Recommendations for Other Services       Precautions / Restrictions Precautions Precautions: Fall Precaution Comments: reports he has a history of orthostatic hypotension Required Braces or Orthoses: Other Brace/Splint Other Brace/Splint: PRAFO R foot Restrictions Weight Bearing Restrictions: No Other Position/Activity Restrictions: trying to minimize weight bearing R heel    Mobility  Bed Mobility Overal bed mobility: Needs Assistance Bed Mobility: Supine to Sit     Supine to sit: Min guard     General bed mobility comments: Cues for technqiue; incr time and inefficient scooting  Transfers Overall transfer level: Needs assistance Equipment used: Rolling walker (2 wheeled) Transfers: Sit to/from Stand Sit to Stand: Mod assist         General transfer comment: Light mod assist to power up; Needing mod assist  to maintain balance in standing  Ambulation/Gait Ambulation/Gait assistance: Mod assist;+2 safety/equipment Ambulation Distance (Feet): 15 Feet(7+7; one seated rest break) Assistive device: Rolling walker (2 wheeled) Gait Pattern/deviations: Decreased step length - right;Decreased step length - left     General Gait Details: Cues to self-monitor for activity tolerance; Mod assist to maintain balance; very unsteady, with multidirectional sway   Stairs            Wheelchair Mobility    Modified Rankin (Stroke Patients Only)       Balance     Sitting balance-Leahy Scale: Fair       Standing balance-Leahy Scale: Poor                              Cognition Arousal/Alertness: Awake/alert Behavior During Therapy: WFL for tasks assessed/performed Overall Cognitive Status: Within Functional Limits for tasks assessed                                        Exercises      General Comments        Pertinent Vitals/Pain Pain Assessment: No/denies pain Pain Intervention(s): Monitored during session    Home Living                      Prior Function            PT Goals (current goals can now be found in the care plan section) Acute Rehab PT Goals Patient  Stated Goal: go home PT Goal Formulation: With patient Time For Goal Achievement: 05/09/17 Potential to Achieve Goals: Good Progress towards PT goals: Progressing toward goals    Frequency    Min 3X/week      PT Plan Other (comment);Discharge plan needs to be updated(CIR deferring SNF)    Co-evaluation              AM-PAC PT "6 Clicks" Daily Activity  Outcome Measure  Difficulty turning over in bed (including adjusting bedclothes, sheets and blankets)?: A Little Difficulty moving from lying on back to sitting on the side of the bed? : A Little Difficulty sitting down on and standing up from a chair with arms (e.g., wheelchair, bedside commode, etc,.)?:  Unable Help needed moving to and from a bed to chair (including a wheelchair)?: A Lot Help needed walking in hospital room?: A Lot Help needed climbing 3-5 steps with a railing? : A Lot 6 Click Score: 13    End of Session Equipment Utilized During Treatment: Gait belt Activity Tolerance: Patient tolerated treatment well Patient left: in chair;with call bell/phone within reach Nurse Communication: Mobility status PT Visit Diagnosis: Other abnormalities of gait and mobility (R26.89);Muscle weakness (generalized) (M62.81);Ataxic gait (R26.0);Difficulty in walking, not elsewhere classified (R26.2)     Time: 1561-5379 PT Time Calculation (min) (ACUTE ONLY): 22 min  Charges:  $Gait Training: 8-22 mins                    G Codes:       Roney Marion, [place] Pager [phone] [place] 04/26/2017, 12:16 PM

## 2017-04-27 DIAGNOSIS — L97409 Non-pressure chronic ulcer of unspecified heel and midfoot with unspecified severity: Secondary | ICD-10-CM

## 2017-04-27 DIAGNOSIS — I82423 Acute embolism and thrombosis of iliac vein, bilateral: Secondary | ICD-10-CM

## 2017-04-27 LAB — BASIC METABOLIC PANEL
Anion gap: 10 (ref 5–15)
BUN: 14 mg/dL (ref 6–20)
CHLORIDE: 113 mmol/L — AB (ref 101–111)
CO2: 17 mmol/L — AB (ref 22–32)
CREATININE: 1.37 mg/dL — AB (ref 0.61–1.24)
Calcium: 8.5 mg/dL — ABNORMAL LOW (ref 8.9–10.3)
GFR calc Af Amer: 51 mL/min — ABNORMAL LOW (ref >60)
GFR calc non Af Amer: 44 mL/min — ABNORMAL LOW (ref >60)
GLUCOSE: 123 mg/dL — AB (ref 65–99)
POTASSIUM: 3.7 mmol/L (ref 3.5–5.1)
SODIUM: 140 mmol/L (ref 135–145)

## 2017-04-27 LAB — CBC
HCT: 31.8 % — ABNORMAL LOW (ref 39.0–52.0)
Hemoglobin: 10 g/dL — ABNORMAL LOW (ref 13.0–17.0)
MCH: 27.6 pg (ref 26.0–34.0)
MCHC: 31.4 g/dL (ref 30.0–36.0)
MCV: 87.8 fL (ref 78.0–100.0)
Platelets: 210 10*3/uL (ref 150–400)
RBC: 3.62 MIL/uL — AB (ref 4.22–5.81)
RDW: 17.9 % — AB (ref 11.5–15.5)
WBC: 8.9 10*3/uL (ref 4.0–10.5)

## 2017-04-27 LAB — GLUCOSE, CAPILLARY
Glucose-Capillary: 123 mg/dL — ABNORMAL HIGH (ref 65–99)
Glucose-Capillary: 157 mg/dL — ABNORMAL HIGH (ref 65–99)
Glucose-Capillary: 157 mg/dL — ABNORMAL HIGH (ref 65–99)

## 2017-04-27 LAB — PROTIME-INR
INR: 1.32
Prothrombin Time: 16.3 seconds — ABNORMAL HIGH (ref 11.4–15.2)

## 2017-04-27 MED ORDER — APIXABAN 2.5 MG PO TABS: 2.5000 mg | ORAL_TABLET | Freq: Two times a day (BID) | ORAL | 0 refills | Status: AC

## 2017-04-27 MED ORDER — COLLAGENASE 250 UNIT/GM EX OINT: TOPICAL_OINTMENT | Freq: Every day | CUTANEOUS | 0 refills | Status: AC

## 2017-04-27 MED ORDER — WARFARIN SODIUM 5 MG PO TABS: 5.0000 mg | ORAL_TABLET | Freq: Once | ORAL | Status: DC

## 2017-04-27 MED ORDER — BACITRACIN-NEOMYCIN-POLYMYXIN OINTMENT TUBE: 1.0000 "application " | TOPICAL_OINTMENT | Freq: Every day | CUTANEOUS | 0 refills | Status: AC

## 2017-04-27 MED ORDER — BOOST PO LIQD: 237.0000 mL | Freq: Two times a day (BID) | ORAL | 0 refills | Status: AC

## 2017-04-27 MED ORDER — ENOXAPARIN SODIUM 120 MG/0.8ML ~~LOC~~ SOLN
120.0000 mg | SUBCUTANEOUS | Status: DC
  Administered 2017-04-27: 120 mg via SUBCUTANEOUS
  Filled 2017-04-27: qty 0.8

## 2017-04-27 MED ORDER — APIXABAN 2.5 MG PO TABS: 2.5000 mg | ORAL_TABLET | Freq: Two times a day (BID) | ORAL | Status: DC

## 2017-04-27 MED ORDER — DOXYCYCLINE HYCLATE 100 MG PO TABS: 100.0000 mg | ORAL_TABLET | Freq: Two times a day (BID) | ORAL | 0 refills | Status: DC

## 2017-04-27 MED ORDER — AMOXICILLIN-POT CLAVULANATE 875-125 MG PO TABS: 1.0000 | ORAL_TABLET | Freq: Two times a day (BID) | ORAL | 0 refills | Status: DC

## 2017-04-27 NOTE — Progress Notes (Signed)
Archie Endo. to be D/C'd  per MD order. Discussed with the patient and son Harrie Jeans) and all questions fully answered.  VSS, Skin clean, dry and intact without evidence of skin break down, no evidence of skin tears noted.  IV catheter discontinued intact. Site without signs and symptoms of complications. Dressing and pressure applied.  An After Visit Summary was printed and given to the patient. Patient received prescription.  D/c education completed with patient/family including follow up instructions, medication list, d/c activities limitations if indicated, with other d/c instructions as indicated by MD - patient able to verbalize understanding, all questions fully answered.   Patient instructed to return to ED, call 911, or call MD for any changes in condition.   Patient to be escorted via [place], and D/C home via private auto.

## 2017-04-27 NOTE — Care Management (Signed)
Submitted benefits check for   Xarelto 20 mg daily for 30 days  Eliquis 5 mg PO BID for 30 days   Magdalen Spatz RN BSN [phone]

## 2017-04-27 NOTE — Discharge Instructions (Addendum)
Information on my medicine - ELIQUIS (apixaban)  Why was Eliquis prescribed for you? Eliquis was prescribed for you to reduce the risk of forming blood clots that can cause a stroke due to your history of Atrial fibrillation and history of clots.   What do You need to know about Eliquis ? Take your Eliquis TWICE DAILY - one tablet in the morning and one tablet in the evening with or without food.  It would be best to take the doses about the same time each day.  If you have difficulty swallowing the tablet whole please discuss with your pharmacist how to take the medication safely.  Take Eliquis exactly as prescribed by your doctor and DO NOT stop taking Eliquis without talking to the doctor who prescribed the medication.  Stopping may increase your risk of developing a new clot or stroke.  Refill your prescription before you run out.  After discharge, you should have regular check-up appointments with your healthcare provider that is prescribing your Eliquis.  In the future your dose may need to be changed if your kidney function or weight changes by a significant amount or as you get older.  What do you do if you miss a dose? If you miss a dose, take it as soon as you remember on the same day and resume taking twice daily.  Do not take more than one dose of ELIQUIS at the same time.  Important Safety Information A possible side effect of Eliquis is bleeding. You should call your healthcare provider right away if you experience any of the following: ? Bleeding from an injury or your nose that does not stop. ? Unusual colored urine (red or dark brown) or unusual colored stools (red or black). ? Unusual bruising for unknown reasons. ? A serious fall or if you hit your head (even if there is no bleeding).  Some medicines may interact with Eliquis and might increase your risk of bleeding or clotting while on Eliquis. To help avoid this, consult your healthcare provider or pharmacist  prior to using any new prescription or non-prescription medications, including herbals, vitamins, non-steroidal anti-inflammatory drugs (NSAIDs) and supplements.  This website has more information on Eliquis (apixaban): [email or web address].

## 2017-04-27 NOTE — Care Management Note (Signed)
Case Management Note  Patient Details  Name: Vega Withrow. MRN: [id number] Date of Birth: [birth year]  Subjective/Objective:                    Action/Plan:  Spoke to patient's son Harrie Jeans [phone]. Harrie Jeans has discussed SNF vs Home Health with his parents , they want to go home with home health. Harrie Jeans is able to transport patient home at discharge. Harrie Jeans would like [facility] to call him.   Explained Lovenox and Coumadin bridge vs Xarelto or Eliquis . Harrie Jeans prefers Xarelto or Eliquis , explained 30 day free card , and waiting on price after 30 days. Chuck voiced understanding. Expected Discharge Date:                  Expected Discharge Plan:  [place]  In-House Referral:     Discharge planning Services  CM Consult  Post Acute Care Choice:  Home Health, Durable Medical Equipment Choice offered to:  Patient, Spouse  DME Arranged:  N/A DME Agency:  NA  HH Arranged:  RN, PT, OT, Nurse's Aide, Social Work [employer] Agency:  [place]  Status of Service:  In process, will continue to follow  If discussed at Long Length of Stay Meetings, dates discussed:    Additional Comments:  Marilu Favre, RN 04/27/2017, 11:40 AM

## 2017-04-27 NOTE — Progress Notes (Addendum)
[place] for Eliquis Indication: atrial fibrillation and DVT  Patient Measurements: Height: 5\' 10"  (177.8 cm) Weight: 179 lb 14.3 oz (81.6 kg) IBW/kg (Calculated) : 73 Heparin Dosing Weight: 84 kg   Vital Signs: Temp: 98.2 F (36.8 C) (02/28 0455) Temp Source: Oral (02/28 0455) BP: 147/67 (02/28 0455) Pulse Rate: 100 (02/28 0455)  Assessment: 34 YOM with a hx of recurrent DVTs + PAF (was on warfarin 5mg  daily, but per note on this admission had not taken in >90 days), recent hematoma requiring skin graft. Hep IV > enox. Coumadin restarted on 2/27. SCr improving. INR 1.32. Hgb 10, plts wnl.   Goal of Therapy:  INR 2.0-3.0 Monitor platelets by anticoagulation protocol: Yes   Plan:  Change Lovenox to 120mg  [place] Q24h (1.5mg /kg [place] q24h)  Give Coumadin 5mg  po x1 tonight  Monitor daily INR, CBC, s/s of bleed  ADDENDUM:   Now to switch over to Eliquis. Using for hx of DVTs and Afib. Last Lovenox was given today at 1300. Will start Eliquis tomorrow. Hgb 10, plts wnl.  Plan: Stop Coumadin and Lovenox Start Eliquis 2.5mg  PO BID tomorrow Monitor CBC, s/s of bleed  Elenor Quinones, PharmD, [facility] Clinical Pharmacist Pager [phone] 04/27/2017 9:43 AM

## 2017-04-27 NOTE — Care Management (Addendum)
[place] all prescriptions and discharge summary to Dr Tarry Kos at [facility] [phone].     Patient gets prescriptions through [place] in [place] . Spoke to [facility] at [facility]. Patient's PCP is DR Tarry Kos . NCM needs to fax copy of prescription and discharge summary to DR [facility] at [phone], for DR Tarry Kos to approve and write [facility] prescription. In meantime NCM can give patient 30 day free card and prescription for 30 days to patient and he can have prescription filled at any retail pharmacy .   Text paged Dr Sloan Leiter . Explained above.   All of above explained to patient, Harrie Jeans and Economist. All voiced understanding. Once discharge summary and prescription available NCM will fax to [place].   Magdalen Spatz RN BSN [phone]

## 2017-04-27 NOTE — Care Management Important Message (Signed)
Important Message  Patient Details  Name: Don Hall. MRN: [id number] Date of Birth: [birth year]   Medicare Important Message Given:  Yes    Kirah Stice 04/27/2017, 12:25 PM

## 2017-04-27 NOTE — Discharge Summary (Signed)
PATIENT DETAILS Name: Don Hall. Age: 84 y.o. Sex: male Date of Birth: 08/25/1927 MRN: [id number]. Admitting Physician: Samuella Cota, MD FMB:[id number], Mitzie Na, MD  Admit Date: 04/23/2017 Discharge date: 04/27/2017  Recommendations for Outpatient Follow-up:  1. Follow up with PCP in 1-2 weeks 2. Please obtain BMP/CBC in one week 3. Ensure outpatient follow-up with the wound care clinic. 4. Please note-now on Eliquis.  Admitted From:  Home   Disposition: Home with home health services (note refused SNF)   Home Health: Yes  Equipment/Devices: None  Discharge Condition: Stable  CODE STATUS: FULL CODE  Diet recommendation:  Heart Healthy / Carb Modified  Brief Summary: See H&P, Labs, Consult and Test reports for all details in brief, Patient is a 84 y.o. male history of atrial fibrillation on anticoagulation, DM-2, chronic kidney disease stage III-who was just released from a skilled nursing facility 1 day prior to this admission after a 59-month stay for treatment of skin graft after a large hematoma presented to the hospital for evaluation of a syncopal episode, he was also found to have occasional myoclonus jerks, and thought to have sepsis secondary to right heel wound infection.  Admitted and provided supportive care-clinically significantly improved.  See below for further details   Brief Hospital Course: Sepsis secondary to right heel wound infection: Sepsis pathophysiology has resolved, cultures remain negative-wound on my exam does not look infected-we will transition to oral doxycycline and Augmentin.  Syncope: Thought to be secondary to orthostatic hypotension-treated with IV fluids.  Continue midodrine.  Echocardiogram with preserved EF.  Myoclonic jerks: Resolved-Zoloft has been resumed.  DM-2: Diet controlled, CBG stable with SSI.  A1c 6.4  Normocytic anemia: Appears to be chronic-stable for outpatient follow-up  Hypokalemia:  Repleted  Chronic kidney disease stage III: Creatinine appears to be close to usual baseline, follow periodically  History of multiple DVTs: On warfarin until hospitalization in November-anticoagulation was discontinued as he had a large hematoma in his left lower extremity-previous MD (Dr. Sarajane Jews) spoke with patient on 2/26-patient desired to be restarted on anticoagulation given his prior history of venous thromboembolism, he was subsequently started on Coumadin and overlapping Lovenox.  This MD again had a long conversation with the patient today, since he had a complication with Coumadin-he is agreeable to try an alternative agent-we will switch to Eliquis.   Paroxysmal atrial fibrillation: Rate controlled without the use of any rate limiting agents-now on Eliquis.    Right heel wound: Present prior to admission-unstageable-did not see any evidence of drainage on my exam.  Some yellowish slough present.  Stable for outpatient follow-up with wound care clinic.  Home health services has been arranged.  History of large left lower extremity wound due to hematoma-status post skin graft January 2019: Dressing in place-skin graft appears to be intact   Moderate malnutrition: Continue supplements  Procedures/Studies: None  Discharge Diagnoses:  Principal Problem:   Sepsis ([place]) Active Problems:   Essential hypertension, benign   Paroxysmal atrial fibrillation ([place])   Long term (current) use of anticoagulants   Atherosclerosis of artery of extremity with ulceration (HCC)   Varicose veins of left lower extremity with ulcer of calf (HCC)   Chronic kidney disease, stage 3 (HCC)   Myoclonus   Generalized weakness   Pre-syncope   Heel ulcer (HCC)   Orthostatic hypotension   Anemia of chronic disease   DVT (deep venous thrombosis) (HCC)   Malnutrition of moderate degree   Discharge Instructions:  Activity:  As  tolerated with Full fall precautions use walker/cane & assistance as  needed   Discharge Instructions    Call MD for:  redness, tenderness, or signs of infection (pain, swelling, redness, odor or green/yellow discharge around incision site)   Complete by:  As directed    Diet - low sodium heart healthy   Complete by:  As directed    Diet Carb Modified   Complete by:  As directed    Discharge instructions   Complete by:  As directed    Follow with Primary MD  Caryl Bis, MD in 1 week. Follow with wound clinic as previous.  Please get a complete blood count and chemistry panel checked by your Primary MD at your next visit, and again as instructed by your Primary MD.  Get Medicines reviewed and adjusted: Please take all your medications with you for your next visit with your Primary MD  Laboratory/radiological data: Please request your Primary MD to go over all hospital tests and procedure/radiological results at the follow up, please ask your Primary MD to get all Hospital records sent to his/her office.  In some cases, they will be blood work, cultures and biopsy results pending at the time of your discharge. Please request that your primary care M.D. follows up on these results.  Also Note the following: If you experience worsening of your admission symptoms, develop shortness of breath, life threatening emergency, suicidal or homicidal thoughts you must seek medical attention immediately by calling 911 or calling your MD immediately  if symptoms less severe.  You must read complete instructions/literature along with all the possible adverse reactions/side effects for all the Medicines you take and that have been prescribed to you. Take any new Medicines after you have completely understood and accpet all the possible adverse reactions/side effects.   Do not drive when taking Pain medications or sleeping medications (Benzodaizepines)  Do not take more than prescribed Pain, Sleep and Anxiety Medications. It is not advisable to combine anxiety,sleep  and pain medications without talking with your primary care practitioner  Special Instructions: If you have smoked or chewed Tobacco  in the last 2 yrs please stop smoking, stop any regular Alcohol  and or any Recreational drug use.  Wear Seat belts while driving.  Please note: You were cared for by a hospitalist during your hospital stay. Once you are discharged, your primary care physician will handle any further medical issues. Please note that NO REFILLS for any discharge medications will be authorized once you are discharged, as it is imperative that you return to your primary care physician (or establish a relationship with a primary care physician if you do not have one) for your post hospital discharge needs so that they can reassess your need for medications and monitor your lab values.   Discharge wound care:   Complete by:  As directed    Dressing procedure/placement/frequency: Float heels to reduce pressure.  Santyl ointment to provide enzymatic debridement of nonviable tissue to right heel.  Continue present plan of care with neosporin ointment to left graft edges and nonadherent dressing.   Increase activity slowly   Complete by:  As directed      Allergies as of 04/27/2017   No Known Allergies     Medication List    STOP taking these medications   warfarin 5 MG tablet Commonly known as:  COUMADIN     TAKE these medications   acetaminophen 325 MG tablet Commonly known as:  TYLENOL Take  650 mg by mouth every 6 (six) hours as needed for mild pain.   amoxicillin-clavulanate 875-125 MG tablet Commonly known as:  AUGMENTIN Take 1 tablet by mouth every 12 (twelve) hours.   apixaban 2.5 MG Tabs tablet Commonly known as:  ELIQUIS Take 1 tablet (2.5 mg total) by mouth 2 (two) times daily. Start taking on:  04/28/2017   atorvastatin 10 MG tablet Commonly known as:  LIPITOR Take 10 mg by mouth every evening.   calcium carbonate 500 MG chewable tablet Commonly known as:   TUMS - dosed in mg elemental calcium Chew 1 tablet by mouth 2 (two) times daily.   cholecalciferol 400 units Tabs tablet Commonly known as:  VITAMIN D Take 400 Units by mouth 2 (two) times daily.   collagenase ointment Commonly known as:  SANTYL Apply topically daily. Apply Santyl to right heel wound Q day, then cover with moist gauze 2X2 and foam dressing.  (Change foam dressing Q 3 days or PRN soiling.) Start taking on:  04/28/2017   doxycycline 100 MG tablet Commonly known as:  VIBRA-TABS Take 1 tablet (100 mg total) by mouth every 12 (twelve) hours.   HYDROcodone-acetaminophen 5-325 MG tablet Commonly known as:  NORCO/VICODIN Take 1 tablet by mouth every 4 (four) hours as needed for moderate pain.   iron polysaccharides 150 MG capsule Commonly known as:  NIFEREX Take 150 mg by mouth 2 (two) times daily.   lactose free nutrition Liqd Take 237 mLs by mouth 2 (two) times daily between meals. Start taking on:  04/28/2017   magnesium oxide 400 MG tablet Commonly known as:  MAG-OX Take 400 mg by mouth 2 (two) times daily.   midodrine 5 MG tablet Commonly known as:  PROAMATINE Take 5 mg by mouth 2 (two) times daily with a meal.   MISC NATURAL PRODUCTS PO Venastate - take one tab by mouth daily   neomycin-bacitracin-polymyxin Oint Commonly known as:  NEOSPORIN Apply 1 application topically daily. Apply Neosporin to edges of left leg wound Q day, then cover with nonadherent dressing and kerlex   ondansetron 4 MG tablet Commonly known as:  ZOFRAN Take 4 mg by mouth every 8 (eight) hours as needed for nausea or vomiting.   pantoprazole 40 MG tablet Commonly known as:  PROTONIX Take 40 mg by mouth daily.   PRESERVISION AREDS 2 PO Take 1 tablet by mouth daily.   REFRESH 1 % ophthalmic solution Generic drug:  carboxymethylcellulose Place 1 drop into both eyes daily as needed (dry eyes).   sertraline 100 MG tablet Commonly known as:  ZOLOFT Take 100 mg by mouth every  evening.   vitamin C 500 MG tablet Commonly known as:  ASCORBIC ACID Take 500 mg by mouth daily.            Discharge Care Instructions  (From admission, onward)        Start     Ordered   04/27/17 0000  Discharge wound care:    Comments:  Dressing procedure/placement/frequency: Float heels to reduce pressure.  Santyl ointment to provide enzymatic debridement of nonviable tissue to right heel.  Continue present plan of care with neosporin ointment to left graft edges and nonadherent dressing.   04/27/17 1401     Follow-up Information    Caryl Bis, MD. Schedule an appointment as soon as possible for a visit in 1 week(s).   Specialty:  Family Medicine Contact information: [address] [place] [address] [phone]  No Known Allergies  Consultations:   None  Other Procedures/Studies: Ct Head Wo Contrast  Result Date: 04/23/2017 CLINICAL DATA:  Syncopal episode at home.  Seizure-like activity. EXAM: CT HEAD WITHOUT CONTRAST CT CERVICAL SPINE WITHOUT CONTRAST TECHNIQUE: Multidetector CT imaging of the head and cervical spine was performed following the standard protocol without intravenous contrast. Multiplanar CT image reconstructions of the cervical spine were also generated. COMPARISON:  04/19/2016 FINDINGS: CT HEAD FINDINGS Brain: No evidence of acute infarction, hemorrhage, hydrocephalus, extra-axial collection or mass lesion/mass effect. There is ventricular greater than sulcal enlargement consistent with atrophy, stable from the prior CT. Mild periventricular white matter hypoattenuation is also noted consistent with chronic microvascular ischemic change, also stable. Vascular: No hyperdense vessel or unexpected calcification. Skull: Normal. Negative for fracture or focal lesion. Sinuses/Orbits: Globes and orbits are unremarkable. Mild left maxillary sinus mucosal thickening. Sinuses otherwise clear. Clear mastoid air cells. Other: None. CT CERVICAL SPINE  FINDINGS Alignment: Slight degenerative anterolisthesis of C4 on C5. No other spondylolisthesis. Skull base and vertebrae: No acute fracture. No primary bone lesion or focal pathologic process. Soft tissues and spinal canal: No prevertebral fluid or swelling. No visible canal hematoma. Disc levels: Mild loss disc height at C3-C4-C4-C5. Moderate loss disc height at C5-C6, C6-C7 and C7-T1. There are facet degenerative changes bilaterally greatest at C3-C4, C4-C5. Spondylotic disc bulging is noted from C3-C4 through C6-C7. Uncovertebral spurring causes neural foraminal narrowing, moderate bilaterally at C 5-C6 and C6-C7. No convincing disc herniation. Upper chest: No masses or adenopathy. No acute findings. Lung apices are clear. Other: None IMPRESSION: HEAD CT 1. No acute intracranial abnormalities. 2. Atrophy and chronic microvascular ischemic change. CERVICAL CT 1. No fracture or acute finding. Electronically Signed   By: Lajean Manes M.D.   On: 04/23/2017 15:09   Ct Cervical Spine Wo Contrast  Result Date: 04/23/2017 CLINICAL DATA:  Syncopal episode at home.  Seizure-like activity. EXAM: CT HEAD WITHOUT CONTRAST CT CERVICAL SPINE WITHOUT CONTRAST TECHNIQUE: Multidetector CT imaging of the head and cervical spine was performed following the standard protocol without intravenous contrast. Multiplanar CT image reconstructions of the cervical spine were also generated. COMPARISON:  04/19/2016 FINDINGS: CT HEAD FINDINGS Brain: No evidence of acute infarction, hemorrhage, hydrocephalus, extra-axial collection or mass lesion/mass effect. There is ventricular greater than sulcal enlargement consistent with atrophy, stable from the prior CT. Mild periventricular white matter hypoattenuation is also noted consistent with chronic microvascular ischemic change, also stable. Vascular: No hyperdense vessel or unexpected calcification. Skull: Normal. Negative for fracture or focal lesion. Sinuses/Orbits: Globes and orbits  are unremarkable. Mild left maxillary sinus mucosal thickening. Sinuses otherwise clear. Clear mastoid air cells. Other: None. CT CERVICAL SPINE FINDINGS Alignment: Slight degenerative anterolisthesis of C4 on C5. No other spondylolisthesis. Skull base and vertebrae: No acute fracture. No primary bone lesion or focal pathologic process. Soft tissues and spinal canal: No prevertebral fluid or swelling. No visible canal hematoma. Disc levels: Mild loss disc height at C3-C4-C4-C5. Moderate loss disc height at C5-C6, C6-C7 and C7-T1. There are facet degenerative changes bilaterally greatest at C3-C4, C4-C5. Spondylotic disc bulging is noted from C3-C4 through C6-C7. Uncovertebral spurring causes neural foraminal narrowing, moderate bilaterally at C 5-C6 and C6-C7. No convincing disc herniation. Upper chest: No masses or adenopathy. No acute findings. Lung apices are clear. Other: None IMPRESSION: HEAD CT 1. No acute intracranial abnormalities. 2. Atrophy and chronic microvascular ischemic change. CERVICAL CT 1. No fracture or acute finding. Electronically Signed   By: Lajean Manes  M.D.   On: 04/23/2017 15:09   Dg Chest Portable 1 View  Result Date: 04/23/2017 CLINICAL DATA:  Near syncope. EXAM: PORTABLE CHEST 1 VIEW COMPARISON:  January 20, 2017 FINDINGS: The heart size and mediastinal contours are within normal limits. Both lungs are clear. The visualized skeletal structures are unremarkable. IMPRESSION: No active disease. Electronically Signed   By: Dorise Bullion III M.D   On: 04/23/2017 12:01      TODAY-DAY OF DISCHARGE:  Subjective:   Don Hall today has no headache,no chest abdominal pain,no new weakness tingling or numbness, feels much better wants to go home today.   Objective:   Blood pressure (!) 147/67, pulse 100, temperature 98.2 F (36.8 C), temperature source Oral, resp. rate 18, height 5\' 10"  (1.778 m), weight 81.6 kg (179 lb 14.3 oz), SpO2 99 %.  Intake/Output Summary (Last 24  hours) at 04/27/2017 1402 Last data filed at 04/27/2017 1300 Gross per 24 hour  Intake 360 ml  Output 1076 ml  Net -716 ml   Filed Weights   04/23/17 1233 04/25/17 0409 04/26/17 0523  Weight: 84.1 kg (185 lb 8 oz) 83.5 kg (184 lb 1.4 oz) 81.6 kg (179 lb 14.3 oz)    Exam: Awake Alert, Oriented *3, No new F.N deficits, Normal affect [place].AT,PERRAL Supple Neck,No JVD, No cervical lymphadenopathy appriciated.  Symmetrical Chest wall movement, Good air movement bilaterally, CTAB RRR,No Gallops,Rubs or new Murmurs, No Parasternal Heave +ve B.Sounds, Abd Soft, Non tender, No organomegaly appriciated, No rebound -guarding or rigidity. No Cyanosis, Clubbing or edema, No new Rash or bruise   PERTINENT RADIOLOGIC STUDIES: Ct Head Wo Contrast  Result Date: 04/23/2017 CLINICAL DATA:  Syncopal episode at home.  Seizure-like activity. EXAM: CT HEAD WITHOUT CONTRAST CT CERVICAL SPINE WITHOUT CONTRAST TECHNIQUE: Multidetector CT imaging of the head and cervical spine was performed following the standard protocol without intravenous contrast. Multiplanar CT image reconstructions of the cervical spine were also generated. COMPARISON:  04/19/2016 FINDINGS: CT HEAD FINDINGS Brain: No evidence of acute infarction, hemorrhage, hydrocephalus, extra-axial collection or mass lesion/mass effect. There is ventricular greater than sulcal enlargement consistent with atrophy, stable from the prior CT. Mild periventricular white matter hypoattenuation is also noted consistent with chronic microvascular ischemic change, also stable. Vascular: No hyperdense vessel or unexpected calcification. Skull: Normal. Negative for fracture or focal lesion. Sinuses/Orbits: Globes and orbits are unremarkable. Mild left maxillary sinus mucosal thickening. Sinuses otherwise clear. Clear mastoid air cells. Other: None. CT CERVICAL SPINE FINDINGS Alignment: Slight degenerative anterolisthesis of C4 on C5. No other spondylolisthesis. Skull base  and vertebrae: No acute fracture. No primary bone lesion or focal pathologic process. Soft tissues and spinal canal: No prevertebral fluid or swelling. No visible canal hematoma. Disc levels: Mild loss disc height at C3-C4-C4-C5. Moderate loss disc height at C5-C6, C6-C7 and C7-T1. There are facet degenerative changes bilaterally greatest at C3-C4, C4-C5. Spondylotic disc bulging is noted from C3-C4 through C6-C7. Uncovertebral spurring causes neural foraminal narrowing, moderate bilaterally at C 5-C6 and C6-C7. No convincing disc herniation. Upper chest: No masses or adenopathy. No acute findings. Lung apices are clear. Other: None IMPRESSION: HEAD CT 1. No acute intracranial abnormalities. 2. Atrophy and chronic microvascular ischemic change. CERVICAL CT 1. No fracture or acute finding. Electronically Signed   By: Lajean Manes M.D.   On: 04/23/2017 15:09   Ct Cervical Spine Wo Contrast  Result Date: 04/23/2017 CLINICAL DATA:  Syncopal episode at home.  Seizure-like activity. EXAM: CT HEAD WITHOUT CONTRAST CT CERVICAL  SPINE WITHOUT CONTRAST TECHNIQUE: Multidetector CT imaging of the head and cervical spine was performed following the standard protocol without intravenous contrast. Multiplanar CT image reconstructions of the cervical spine were also generated. COMPARISON:  04/19/2016 FINDINGS: CT HEAD FINDINGS Brain: No evidence of acute infarction, hemorrhage, hydrocephalus, extra-axial collection or mass lesion/mass effect. There is ventricular greater than sulcal enlargement consistent with atrophy, stable from the prior CT. Mild periventricular white matter hypoattenuation is also noted consistent with chronic microvascular ischemic change, also stable. Vascular: No hyperdense vessel or unexpected calcification. Skull: Normal. Negative for fracture or focal lesion. Sinuses/Orbits: Globes and orbits are unremarkable. Mild left maxillary sinus mucosal thickening. Sinuses otherwise clear. Clear mastoid air  cells. Other: None. CT CERVICAL SPINE FINDINGS Alignment: Slight degenerative anterolisthesis of C4 on C5. No other spondylolisthesis. Skull base and vertebrae: No acute fracture. No primary bone lesion or focal pathologic process. Soft tissues and spinal canal: No prevertebral fluid or swelling. No visible canal hematoma. Disc levels: Mild loss disc height at C3-C4-C4-C5. Moderate loss disc height at C5-C6, C6-C7 and C7-T1. There are facet degenerative changes bilaterally greatest at C3-C4, C4-C5. Spondylotic disc bulging is noted from C3-C4 through C6-C7. Uncovertebral spurring causes neural foraminal narrowing, moderate bilaterally at C 5-C6 and C6-C7. No convincing disc herniation. Upper chest: No masses or adenopathy. No acute findings. Lung apices are clear. Other: None IMPRESSION: HEAD CT 1. No acute intracranial abnormalities. 2. Atrophy and chronic microvascular ischemic change. CERVICAL CT 1. No fracture or acute finding. Electronically Signed   By: Lajean Manes M.D.   On: 04/23/2017 15:09   Dg Chest Portable 1 View  Result Date: 04/23/2017 CLINICAL DATA:  Near syncope. EXAM: PORTABLE CHEST 1 VIEW COMPARISON:  January 20, 2017 FINDINGS: The heart size and mediastinal contours are within normal limits. Both lungs are clear. The visualized skeletal structures are unremarkable. IMPRESSION: No active disease. Electronically Signed   By: Dorise Bullion III M.D   On: 04/23/2017 12:01     PERTINENT LAB RESULTS: CBC: Recent Labs    04/26/17 0554 04/27/17 0518  WBC 7.8 8.9  HGB 9.3* 10.0*  HCT 28.9* 31.8*  PLT 193 210   CMET CMP     Component Value Date/Time   NA 140 04/27/2017 0518   K 3.7 04/27/2017 0518   CL 113 (H) 04/27/2017 0518   CO2 17 (L) 04/27/2017 0518   GLUCOSE 123 (H) 04/27/2017 0518   BUN 14 04/27/2017 0518   CREATININE 1.37 (H) 04/27/2017 0518   CALCIUM 8.5 (L) 04/27/2017 0518   PROT 7.0 04/23/2017 1120   ALBUMIN 3.4 (L) 04/23/2017 1120   AST 32 04/23/2017 1120    ALT 26 04/23/2017 1120   ALKPHOS 101 04/23/2017 1120   BILITOT 0.6 04/23/2017 1120   GFRNONAA 44 (L) 04/27/2017 0518   GFRAA 51 (L) 04/27/2017 0518    GFR Estimated Creatinine Clearance: 38.5 mL/min (A) (by C-G formula based on SCr of 1.37 mg/dL (H)). No results for input(s): LIPASE, AMYLASE in the last 72 hours. No results for input(s): CKTOTAL, CKMB, CKMBINDEX, TROPONINI in the last 72 hours. Invalid input(s): POCBNP No results for input(s): DDIMER in the last 72 hours. No results for input(s): HGBA1C in the last 72 hours. No results for input(s): CHOL, HDL, LDLCALC, TRIG, CHOLHDL, LDLDIRECT in the last 72 hours. No results for input(s): TSH, T4TOTAL, T3FREE, THYROIDAB in the last 72 hours.  Invalid input(s): FREET3 No results for input(s): VITAMINB12, FOLATE, FERRITIN, TIBC, IRON, RETICCTPCT in the last 72 hours.  Coags: Recent Labs    04/27/17 0600  INR 1.32   Microbiology: Recent Results (from the past 240 hour(s))  Blood Culture (routine x 2)     Status: None (Preliminary result)   Collection Time: 04/23/17 12:25 PM  Result Value Ref Range Status   Specimen Description BLOOD RIGHT ANTECUBITAL  Final   Special Requests   Final    BOTTLES DRAWN AEROBIC AND ANAEROBIC Blood Culture adequate volume   Culture   Final    NO GROWTH 3 DAYS Performed at [facility], [address], [place], [place] [address]    Report Status PENDING  Incomplete  Blood Culture (routine x 2)     Status: None (Preliminary result)   Collection Time: 04/23/17 12:52 PM  Result Value Ref Range Status   Specimen Description BLOOD LEFT FOREARM  Final   Special Requests   Final    BOTTLES DRAWN AEROBIC AND ANAEROBIC Blood Culture adequate volume   Culture   Final    NO GROWTH 3 DAYS Performed at [facility], [place] [address], [place], [place] [address]    Report Status PENDING  Incomplete  Urine culture     Status: Abnormal   Collection Time: 04/23/17  3:13 PM  Result Value Ref Range  Status   Specimen Description URINE, CLEAN CATCH  Final   Special Requests   Final    NONE Performed at [facility], [place] [address], [place], [place] [address]    Culture MULTIPLE SPECIES PRESENT, SUGGEST RECOLLECTION (A)  Final   Report Status 04/24/2017 FINAL  Final    FURTHER DISCHARGE INSTRUCTIONS:  Get Medicines reviewed and adjusted: Please take all your medications with you for your next visit with your Primary MD  Laboratory/radiological data: Please request your Primary MD to go over all hospital tests and procedure/radiological results at the follow up, please ask your Primary MD to get all Hospital records sent to his/her office.  In some cases, they will be blood work, cultures and biopsy results pending at the time of your discharge. Please request that your primary care M.D. goes through all the records of your hospital data and follows up on these results.  Also Note the following: If you experience worsening of your admission symptoms, develop shortness of breath, life threatening emergency, suicidal or homicidal thoughts you must seek medical attention immediately by calling 911 or calling your MD immediately  if symptoms less severe.  You must read complete instructions/literature along with all the possible adverse reactions/side effects for all the Medicines you take and that have been prescribed to you. Take any new Medicines after you have completely understood and accpet all the possible adverse reactions/side effects.   Do not drive when taking Pain medications or sleeping medications (Benzodaizepines)  Do not take more than prescribed Pain, Sleep and Anxiety Medications. It is not advisable to combine anxiety,sleep and pain medications without talking with your primary care practitioner  Special Instructions: If you have smoked or chewed Tobacco  in the last 2 yrs please stop smoking, stop any regular Alcohol  and or any Recreational drug use.  Wear Seat  belts while driving.  Please note: You were cared for by a hospitalist during your hospital stay. Once you are discharged, your primary care physician will handle any further medical issues. Please note that NO REFILLS for any discharge medications will be authorized once you are discharged, as it is imperative that you return to your primary  care physician (or establish a relationship with a primary care physician if you do not have one) for your post hospital discharge needs so that they can reassess your need for medications and monitor your lab values.  Total Time spent coordinating discharge including counseling, education and face to face time equals 45 minutes.  SignedOren Binet 04/27/2017 2:02 PM

## 2017-04-28 DIAGNOSIS — M1990 Unspecified osteoarthritis, unspecified site: Secondary | ICD-10-CM | POA: Diagnosis not present

## 2017-04-28 DIAGNOSIS — Z7901 Long term (current) use of anticoagulants: Secondary | ICD-10-CM | POA: Diagnosis not present

## 2017-04-28 DIAGNOSIS — E1122 Type 2 diabetes mellitus with diabetic chronic kidney disease: Secondary | ICD-10-CM | POA: Diagnosis not present

## 2017-04-28 DIAGNOSIS — S8012XD Contusion of left lower leg, subsequent encounter: Secondary | ICD-10-CM | POA: Diagnosis not present

## 2017-04-28 DIAGNOSIS — I129 Hypertensive chronic kidney disease with stage 1 through stage 4 chronic kidney disease, or unspecified chronic kidney disease: Secondary | ICD-10-CM | POA: Diagnosis not present

## 2017-04-28 DIAGNOSIS — E1151 Type 2 diabetes mellitus with diabetic peripheral angiopathy without gangrene: Secondary | ICD-10-CM | POA: Diagnosis not present

## 2017-04-28 DIAGNOSIS — L89620 Pressure ulcer of left heel, unstageable: Secondary | ICD-10-CM | POA: Diagnosis not present

## 2017-04-28 DIAGNOSIS — D631 Anemia in chronic kidney disease: Secondary | ICD-10-CM | POA: Diagnosis not present

## 2017-04-28 DIAGNOSIS — Z85828 Personal history of other malignant neoplasm of skin: Secondary | ICD-10-CM | POA: Diagnosis not present

## 2017-04-28 DIAGNOSIS — E785 Hyperlipidemia, unspecified: Secondary | ICD-10-CM | POA: Diagnosis not present

## 2017-04-28 DIAGNOSIS — Z86718 Personal history of other venous thrombosis and embolism: Secondary | ICD-10-CM | POA: Diagnosis not present

## 2017-04-28 DIAGNOSIS — K219 Gastro-esophageal reflux disease without esophagitis: Secondary | ICD-10-CM | POA: Diagnosis not present

## 2017-04-28 DIAGNOSIS — Z87891 Personal history of nicotine dependence: Secondary | ICD-10-CM | POA: Diagnosis not present

## 2017-04-28 DIAGNOSIS — N183 Chronic kidney disease, stage 3 (moderate): Secondary | ICD-10-CM | POA: Diagnosis not present

## 2017-04-28 DIAGNOSIS — K5790 Diverticulosis of intestine, part unspecified, without perforation or abscess without bleeding: Secondary | ICD-10-CM | POA: Diagnosis not present

## 2017-04-28 DIAGNOSIS — I480 Paroxysmal atrial fibrillation: Secondary | ICD-10-CM | POA: Diagnosis not present

## 2017-04-28 LAB — CULTURE, BLOOD (ROUTINE X 2)
CULTURE: NO GROWTH
Culture: NO GROWTH
SPECIAL REQUESTS: ADEQUATE
Special Requests: ADEQUATE

## 2017-05-01 DIAGNOSIS — N183 Chronic kidney disease, stage 3 (moderate): Secondary | ICD-10-CM | POA: Diagnosis not present

## 2017-05-01 DIAGNOSIS — E1122 Type 2 diabetes mellitus with diabetic chronic kidney disease: Secondary | ICD-10-CM | POA: Diagnosis not present

## 2017-05-01 DIAGNOSIS — E1151 Type 2 diabetes mellitus with diabetic peripheral angiopathy without gangrene: Secondary | ICD-10-CM | POA: Diagnosis not present

## 2017-05-01 DIAGNOSIS — I129 Hypertensive chronic kidney disease with stage 1 through stage 4 chronic kidney disease, or unspecified chronic kidney disease: Secondary | ICD-10-CM | POA: Diagnosis not present

## 2017-05-01 DIAGNOSIS — L89620 Pressure ulcer of left heel, unstageable: Secondary | ICD-10-CM | POA: Diagnosis not present

## 2017-05-01 DIAGNOSIS — S8012XD Contusion of left lower leg, subsequent encounter: Secondary | ICD-10-CM | POA: Diagnosis not present

## 2017-05-02 DIAGNOSIS — S8012XD Contusion of left lower leg, subsequent encounter: Secondary | ICD-10-CM | POA: Diagnosis not present

## 2017-05-02 DIAGNOSIS — E1151 Type 2 diabetes mellitus with diabetic peripheral angiopathy without gangrene: Secondary | ICD-10-CM | POA: Diagnosis not present

## 2017-05-02 DIAGNOSIS — I129 Hypertensive chronic kidney disease with stage 1 through stage 4 chronic kidney disease, or unspecified chronic kidney disease: Secondary | ICD-10-CM | POA: Diagnosis not present

## 2017-05-02 DIAGNOSIS — L89620 Pressure ulcer of left heel, unstageable: Secondary | ICD-10-CM | POA: Diagnosis not present

## 2017-05-02 DIAGNOSIS — N183 Chronic kidney disease, stage 3 (moderate): Secondary | ICD-10-CM | POA: Diagnosis not present

## 2017-05-02 DIAGNOSIS — E1122 Type 2 diabetes mellitus with diabetic chronic kidney disease: Secondary | ICD-10-CM | POA: Diagnosis not present

## 2017-05-03 DIAGNOSIS — I129 Hypertensive chronic kidney disease with stage 1 through stage 4 chronic kidney disease, or unspecified chronic kidney disease: Secondary | ICD-10-CM | POA: Diagnosis not present

## 2017-05-03 DIAGNOSIS — S8012XD Contusion of left lower leg, subsequent encounter: Secondary | ICD-10-CM | POA: Diagnosis not present

## 2017-05-03 DIAGNOSIS — E1122 Type 2 diabetes mellitus with diabetic chronic kidney disease: Secondary | ICD-10-CM | POA: Diagnosis not present

## 2017-05-03 DIAGNOSIS — N183 Chronic kidney disease, stage 3 (moderate): Secondary | ICD-10-CM | POA: Diagnosis not present

## 2017-05-03 DIAGNOSIS — L89620 Pressure ulcer of left heel, unstageable: Secondary | ICD-10-CM | POA: Diagnosis not present

## 2017-05-03 DIAGNOSIS — E1151 Type 2 diabetes mellitus with diabetic peripheral angiopathy without gangrene: Secondary | ICD-10-CM | POA: Diagnosis not present

## 2017-05-04 DIAGNOSIS — E1151 Type 2 diabetes mellitus with diabetic peripheral angiopathy without gangrene: Secondary | ICD-10-CM | POA: Diagnosis not present

## 2017-05-04 DIAGNOSIS — E1122 Type 2 diabetes mellitus with diabetic chronic kidney disease: Secondary | ICD-10-CM | POA: Diagnosis not present

## 2017-05-04 DIAGNOSIS — L89620 Pressure ulcer of left heel, unstageable: Secondary | ICD-10-CM | POA: Diagnosis not present

## 2017-05-04 DIAGNOSIS — S8012XD Contusion of left lower leg, subsequent encounter: Secondary | ICD-10-CM | POA: Diagnosis not present

## 2017-05-04 DIAGNOSIS — I129 Hypertensive chronic kidney disease with stage 1 through stage 4 chronic kidney disease, or unspecified chronic kidney disease: Secondary | ICD-10-CM | POA: Diagnosis not present

## 2017-05-04 DIAGNOSIS — N183 Chronic kidney disease, stage 3 (moderate): Secondary | ICD-10-CM | POA: Diagnosis not present

## 2017-05-05 DIAGNOSIS — N183 Chronic kidney disease, stage 3 (moderate): Secondary | ICD-10-CM | POA: Diagnosis not present

## 2017-05-05 DIAGNOSIS — E1151 Type 2 diabetes mellitus with diabetic peripheral angiopathy without gangrene: Secondary | ICD-10-CM | POA: Diagnosis not present

## 2017-05-05 DIAGNOSIS — S8012XD Contusion of left lower leg, subsequent encounter: Secondary | ICD-10-CM | POA: Diagnosis not present

## 2017-05-05 DIAGNOSIS — L89620 Pressure ulcer of left heel, unstageable: Secondary | ICD-10-CM | POA: Diagnosis not present

## 2017-05-05 DIAGNOSIS — I129 Hypertensive chronic kidney disease with stage 1 through stage 4 chronic kidney disease, or unspecified chronic kidney disease: Secondary | ICD-10-CM | POA: Diagnosis not present

## 2017-05-05 DIAGNOSIS — E1122 Type 2 diabetes mellitus with diabetic chronic kidney disease: Secondary | ICD-10-CM | POA: Diagnosis not present

## 2017-05-08 DIAGNOSIS — I129 Hypertensive chronic kidney disease with stage 1 through stage 4 chronic kidney disease, or unspecified chronic kidney disease: Secondary | ICD-10-CM | POA: Diagnosis not present

## 2017-05-08 DIAGNOSIS — E1122 Type 2 diabetes mellitus with diabetic chronic kidney disease: Secondary | ICD-10-CM | POA: Diagnosis not present

## 2017-05-08 DIAGNOSIS — N183 Chronic kidney disease, stage 3 (moderate): Secondary | ICD-10-CM | POA: Diagnosis not present

## 2017-05-08 DIAGNOSIS — S8012XD Contusion of left lower leg, subsequent encounter: Secondary | ICD-10-CM | POA: Diagnosis not present

## 2017-05-08 DIAGNOSIS — L89620 Pressure ulcer of left heel, unstageable: Secondary | ICD-10-CM | POA: Diagnosis not present

## 2017-05-08 DIAGNOSIS — E1151 Type 2 diabetes mellitus with diabetic peripheral angiopathy without gangrene: Secondary | ICD-10-CM | POA: Diagnosis not present

## 2017-05-09 DIAGNOSIS — L89620 Pressure ulcer of left heel, unstageable: Secondary | ICD-10-CM | POA: Diagnosis not present

## 2017-05-09 DIAGNOSIS — N183 Chronic kidney disease, stage 3 (moderate): Secondary | ICD-10-CM | POA: Diagnosis not present

## 2017-05-09 DIAGNOSIS — E1151 Type 2 diabetes mellitus with diabetic peripheral angiopathy without gangrene: Secondary | ICD-10-CM | POA: Diagnosis not present

## 2017-05-09 DIAGNOSIS — S8012XD Contusion of left lower leg, subsequent encounter: Secondary | ICD-10-CM | POA: Diagnosis not present

## 2017-05-09 DIAGNOSIS — E1122 Type 2 diabetes mellitus with diabetic chronic kidney disease: Secondary | ICD-10-CM | POA: Diagnosis not present

## 2017-05-09 DIAGNOSIS — I129 Hypertensive chronic kidney disease with stage 1 through stage 4 chronic kidney disease, or unspecified chronic kidney disease: Secondary | ICD-10-CM | POA: Diagnosis not present

## 2017-05-10 DIAGNOSIS — E1151 Type 2 diabetes mellitus with diabetic peripheral angiopathy without gangrene: Secondary | ICD-10-CM | POA: Diagnosis not present

## 2017-05-10 DIAGNOSIS — N183 Chronic kidney disease, stage 3 (moderate): Secondary | ICD-10-CM | POA: Diagnosis not present

## 2017-05-10 DIAGNOSIS — E1122 Type 2 diabetes mellitus with diabetic chronic kidney disease: Secondary | ICD-10-CM | POA: Diagnosis not present

## 2017-05-10 DIAGNOSIS — I129 Hypertensive chronic kidney disease with stage 1 through stage 4 chronic kidney disease, or unspecified chronic kidney disease: Secondary | ICD-10-CM | POA: Diagnosis not present

## 2017-05-10 DIAGNOSIS — S8012XD Contusion of left lower leg, subsequent encounter: Secondary | ICD-10-CM | POA: Diagnosis not present

## 2017-05-10 DIAGNOSIS — L89620 Pressure ulcer of left heel, unstageable: Secondary | ICD-10-CM | POA: Diagnosis not present

## 2017-05-11 DIAGNOSIS — E1151 Type 2 diabetes mellitus with diabetic peripheral angiopathy without gangrene: Secondary | ICD-10-CM | POA: Diagnosis not present

## 2017-05-11 DIAGNOSIS — I129 Hypertensive chronic kidney disease with stage 1 through stage 4 chronic kidney disease, or unspecified chronic kidney disease: Secondary | ICD-10-CM | POA: Diagnosis not present

## 2017-05-11 DIAGNOSIS — N183 Chronic kidney disease, stage 3 (moderate): Secondary | ICD-10-CM | POA: Diagnosis not present

## 2017-05-11 DIAGNOSIS — E1122 Type 2 diabetes mellitus with diabetic chronic kidney disease: Secondary | ICD-10-CM | POA: Diagnosis not present

## 2017-05-11 DIAGNOSIS — S8012XD Contusion of left lower leg, subsequent encounter: Secondary | ICD-10-CM | POA: Diagnosis not present

## 2017-05-11 DIAGNOSIS — L89620 Pressure ulcer of left heel, unstageable: Secondary | ICD-10-CM | POA: Diagnosis not present

## 2017-05-16 DIAGNOSIS — L89620 Pressure ulcer of left heel, unstageable: Secondary | ICD-10-CM | POA: Diagnosis not present

## 2017-05-16 DIAGNOSIS — I129 Hypertensive chronic kidney disease with stage 1 through stage 4 chronic kidney disease, or unspecified chronic kidney disease: Secondary | ICD-10-CM | POA: Diagnosis not present

## 2017-05-16 DIAGNOSIS — S8012XD Contusion of left lower leg, subsequent encounter: Secondary | ICD-10-CM | POA: Diagnosis not present

## 2017-05-16 DIAGNOSIS — E1151 Type 2 diabetes mellitus with diabetic peripheral angiopathy without gangrene: Secondary | ICD-10-CM | POA: Diagnosis not present

## 2017-05-16 DIAGNOSIS — E1122 Type 2 diabetes mellitus with diabetic chronic kidney disease: Secondary | ICD-10-CM | POA: Diagnosis not present

## 2017-05-16 DIAGNOSIS — N183 Chronic kidney disease, stage 3 (moderate): Secondary | ICD-10-CM | POA: Diagnosis not present

## 2017-05-18 DIAGNOSIS — S8012XD Contusion of left lower leg, subsequent encounter: Secondary | ICD-10-CM | POA: Diagnosis not present

## 2017-05-18 DIAGNOSIS — L89620 Pressure ulcer of left heel, unstageable: Secondary | ICD-10-CM | POA: Diagnosis not present

## 2017-05-18 DIAGNOSIS — N183 Chronic kidney disease, stage 3 (moderate): Secondary | ICD-10-CM | POA: Diagnosis not present

## 2017-05-18 DIAGNOSIS — E1151 Type 2 diabetes mellitus with diabetic peripheral angiopathy without gangrene: Secondary | ICD-10-CM | POA: Diagnosis not present

## 2017-05-18 DIAGNOSIS — I129 Hypertensive chronic kidney disease with stage 1 through stage 4 chronic kidney disease, or unspecified chronic kidney disease: Secondary | ICD-10-CM | POA: Diagnosis not present

## 2017-05-18 DIAGNOSIS — E1122 Type 2 diabetes mellitus with diabetic chronic kidney disease: Secondary | ICD-10-CM | POA: Diagnosis not present

## 2017-05-19 DIAGNOSIS — R319 Hematuria, unspecified: Secondary | ICD-10-CM | POA: Diagnosis not present

## 2017-05-20 ENCOUNTER — Inpatient Hospital Stay (HOSPITAL_COMMUNITY)
Admission: EM | Admit: 2017-05-20 | Discharge: 2017-05-23 | DRG: 439 | Disposition: A | Discharge status: Home Health | Payer: Medicare Other | Attending: Nephrology | Admitting: Nephrology

## 2017-05-20 ENCOUNTER — Encounter (HOSPITAL_COMMUNITY): Payer: Self-pay | Admitting: Emergency Medicine

## 2017-05-20 ENCOUNTER — Other Ambulatory Visit: Payer: Self-pay

## 2017-05-20 DIAGNOSIS — I480 Paroxysmal atrial fibrillation: Secondary | ICD-10-CM | POA: Diagnosis present

## 2017-05-20 DIAGNOSIS — E785 Hyperlipidemia, unspecified: Secondary | ICD-10-CM | POA: Diagnosis present

## 2017-05-20 DIAGNOSIS — N183 Chronic kidney disease, stage 3 unspecified: Secondary | ICD-10-CM | POA: Diagnosis present

## 2017-05-20 DIAGNOSIS — K859 Acute pancreatitis without necrosis or infection, unspecified: Secondary | ICD-10-CM | POA: Diagnosis not present

## 2017-05-20 DIAGNOSIS — Z87891 Personal history of nicotine dependence: Secondary | ICD-10-CM

## 2017-05-20 DIAGNOSIS — N179 Acute kidney failure, unspecified: Secondary | ICD-10-CM | POA: Diagnosis not present

## 2017-05-20 DIAGNOSIS — E872 Acidosis, unspecified: Secondary | ICD-10-CM | POA: Diagnosis present

## 2017-05-20 DIAGNOSIS — Z7901 Long term (current) use of anticoagulants: Secondary | ICD-10-CM

## 2017-05-20 DIAGNOSIS — R531 Weakness: Secondary | ICD-10-CM | POA: Diagnosis not present

## 2017-05-20 DIAGNOSIS — I129 Hypertensive chronic kidney disease with stage 1 through stage 4 chronic kidney disease, or unspecified chronic kidney disease: Secondary | ICD-10-CM | POA: Diagnosis present

## 2017-05-20 DIAGNOSIS — E860 Dehydration: Secondary | ICD-10-CM | POA: Diagnosis not present

## 2017-05-20 DIAGNOSIS — D638 Anemia in other chronic diseases classified elsewhere: Secondary | ICD-10-CM | POA: Diagnosis present

## 2017-05-20 DIAGNOSIS — Z79899 Other long term (current) drug therapy: Secondary | ICD-10-CM

## 2017-05-20 DIAGNOSIS — R112 Nausea with vomiting, unspecified: Secondary | ICD-10-CM | POA: Diagnosis not present

## 2017-05-20 DIAGNOSIS — Z85828 Personal history of other malignant neoplasm of skin: Secondary | ICD-10-CM

## 2017-05-20 DIAGNOSIS — K219 Gastro-esophageal reflux disease without esophagitis: Secondary | ICD-10-CM | POA: Diagnosis present

## 2017-05-20 DIAGNOSIS — J9811 Atelectasis: Secondary | ICD-10-CM | POA: Diagnosis not present

## 2017-05-20 DIAGNOSIS — Z86718 Personal history of other venous thrombosis and embolism: Secondary | ICD-10-CM

## 2017-05-20 DIAGNOSIS — N281 Cyst of kidney, acquired: Secondary | ICD-10-CM | POA: Diagnosis not present

## 2017-05-20 DIAGNOSIS — I10 Essential (primary) hypertension: Secondary | ICD-10-CM | POA: Diagnosis present

## 2017-05-20 DIAGNOSIS — E1122 Type 2 diabetes mellitus with diabetic chronic kidney disease: Secondary | ICD-10-CM | POA: Diagnosis present

## 2017-05-20 DIAGNOSIS — E876 Hypokalemia: Secondary | ICD-10-CM | POA: Diagnosis present

## 2017-05-20 DIAGNOSIS — Z96651 Presence of right artificial knee joint: Secondary | ICD-10-CM | POA: Diagnosis present

## 2017-05-20 DIAGNOSIS — N189 Chronic kidney disease, unspecified: Secondary | ICD-10-CM

## 2017-05-20 LAB — CBC
HEMATOCRIT: 38.7 % — AB (ref 39.0–52.0)
Hemoglobin: 12.5 g/dL — ABNORMAL LOW (ref 13.0–17.0)
MCH: 27.8 pg (ref 26.0–34.0)
MCHC: 32.3 g/dL (ref 30.0–36.0)
MCV: 86.2 fL (ref 78.0–100.0)
PLATELETS: 391 10*3/uL (ref 150–400)
RBC: 4.49 MIL/uL (ref 4.22–5.81)
RDW: 17 % — AB (ref 11.5–15.5)
WBC: 19.6 10*3/uL — AB (ref 4.0–10.5)

## 2017-05-20 LAB — I-STAT CG4 LACTIC ACID, ED: LACTIC ACID, VENOUS: 3.42 mmol/L — AB (ref 0.5–1.9)

## 2017-05-20 NOTE — ED Notes (Signed)
Notified triage nurse istat lactic results.

## 2017-05-20 NOTE — ED Triage Notes (Signed)
Pt was brought in due to abdominal pain, lack of appetite, decreased urine output. As pt was trying to get out of the car RN by NT was told he "vagale down and had a seizure."  Pt was septic last month.

## 2017-05-21 ENCOUNTER — Emergency Department (HOSPITAL_COMMUNITY): Payer: Medicare Other

## 2017-05-21 DIAGNOSIS — E860 Dehydration: Secondary | ICD-10-CM

## 2017-05-21 DIAGNOSIS — N179 Acute kidney failure, unspecified: Secondary | ICD-10-CM

## 2017-05-21 DIAGNOSIS — Z96651 Presence of right artificial knee joint: Secondary | ICD-10-CM | POA: Diagnosis present

## 2017-05-21 DIAGNOSIS — Z7901 Long term (current) use of anticoagulants: Secondary | ICD-10-CM | POA: Diagnosis not present

## 2017-05-21 DIAGNOSIS — N189 Chronic kidney disease, unspecified: Secondary | ICD-10-CM

## 2017-05-21 DIAGNOSIS — K219 Gastro-esophageal reflux disease without esophagitis: Secondary | ICD-10-CM | POA: Diagnosis present

## 2017-05-21 DIAGNOSIS — E872 Acidosis, unspecified: Secondary | ICD-10-CM | POA: Diagnosis present

## 2017-05-21 DIAGNOSIS — D638 Anemia in other chronic diseases classified elsewhere: Secondary | ICD-10-CM

## 2017-05-21 DIAGNOSIS — E785 Hyperlipidemia, unspecified: Secondary | ICD-10-CM | POA: Diagnosis present

## 2017-05-21 DIAGNOSIS — E1122 Type 2 diabetes mellitus with diabetic chronic kidney disease: Secondary | ICD-10-CM | POA: Diagnosis present

## 2017-05-21 DIAGNOSIS — N183 Chronic kidney disease, stage 3 (moderate): Secondary | ICD-10-CM | POA: Diagnosis not present

## 2017-05-21 DIAGNOSIS — I480 Paroxysmal atrial fibrillation: Secondary | ICD-10-CM | POA: Diagnosis present

## 2017-05-21 DIAGNOSIS — Z86718 Personal history of other venous thrombosis and embolism: Secondary | ICD-10-CM | POA: Diagnosis not present

## 2017-05-21 DIAGNOSIS — Z87891 Personal history of nicotine dependence: Secondary | ICD-10-CM | POA: Diagnosis not present

## 2017-05-21 DIAGNOSIS — I10 Essential (primary) hypertension: Secondary | ICD-10-CM | POA: Diagnosis not present

## 2017-05-21 DIAGNOSIS — E876 Hypokalemia: Secondary | ICD-10-CM | POA: Diagnosis not present

## 2017-05-21 DIAGNOSIS — I129 Hypertensive chronic kidney disease with stage 1 through stage 4 chronic kidney disease, or unspecified chronic kidney disease: Secondary | ICD-10-CM | POA: Diagnosis present

## 2017-05-21 DIAGNOSIS — K859 Acute pancreatitis without necrosis or infection, unspecified: Secondary | ICD-10-CM | POA: Diagnosis present

## 2017-05-21 DIAGNOSIS — Z79899 Other long term (current) drug therapy: Secondary | ICD-10-CM | POA: Diagnosis not present

## 2017-05-21 DIAGNOSIS — R112 Nausea with vomiting, unspecified: Secondary | ICD-10-CM | POA: Diagnosis present

## 2017-05-21 DIAGNOSIS — N281 Cyst of kidney, acquired: Secondary | ICD-10-CM | POA: Diagnosis not present

## 2017-05-21 DIAGNOSIS — Z85828 Personal history of other malignant neoplasm of skin: Secondary | ICD-10-CM | POA: Diagnosis not present

## 2017-05-21 DIAGNOSIS — J9811 Atelectasis: Secondary | ICD-10-CM | POA: Diagnosis not present

## 2017-05-21 HISTORY — DX: Acute kidney failure, unspecified: N17.9

## 2017-05-21 LAB — LIPASE, BLOOD
Lipase: 673 U/L — ABNORMAL HIGH (ref 11–51)
Lipase: 306 U/L — ABNORMAL HIGH (ref 11–51)

## 2017-05-21 LAB — C DIFFICILE QUICK SCREEN W PCR REFLEX
C DIFFICILE (CDIFF) INTERP: NOT DETECTED
C DIFFICILE (CDIFF) TOXIN: NEGATIVE
C Diff antigen: NEGATIVE

## 2017-05-21 LAB — COMPREHENSIVE METABOLIC PANEL
ALBUMIN: 2.6 g/dL — AB (ref 3.5–5.0)
ALK PHOS: 129 U/L — AB (ref 38–126)
ALT: 15 U/L — ABNORMAL LOW (ref 17–63)
ALT: 12 U/L — AB (ref 17–63)
ANION GAP: 11 (ref 5–15)
AST: 21 U/L (ref 15–41)
AST: 22 U/L (ref 15–41)
Albumin: 2.9 g/dL — ABNORMAL LOW (ref 3.5–5.0)
Alkaline Phosphatase: 111 U/L (ref 38–126)
Anion gap: 14 (ref 5–15)
BILIRUBIN TOTAL: 0.8 mg/dL (ref 0.3–1.2)
BUN: 24 mg/dL — AB (ref 6–20)
BUN: 25 mg/dL — ABNORMAL HIGH (ref 6–20)
CALCIUM: 8.5 mg/dL — AB (ref 8.9–10.3)
CHLORIDE: 105 mmol/L (ref 101–111)
CO2: 25 mmol/L (ref 22–32)
CO2: 27 mmol/L (ref 22–32)
CREATININE: 1.99 mg/dL — AB (ref 0.61–1.24)
Calcium: 7.9 mg/dL — ABNORMAL LOW (ref 8.9–10.3)
Chloride: 103 mmol/L (ref 101–111)
Creatinine, Ser: 1.91 mg/dL — ABNORMAL HIGH (ref 0.61–1.24)
GFR calc Af Amer: 33 mL/min — ABNORMAL LOW (ref >60)
GFR calc non Af Amer: 30 mL/min — ABNORMAL LOW (ref >60)
GFR, EST AFRICAN AMERICAN: 34 mL/min — AB (ref >60)
GFR, EST NON AFRICAN AMERICAN: 28 mL/min — AB (ref >60)
GLUCOSE: 149 mg/dL — AB (ref 65–99)
Glucose, Bld: 216 mg/dL — ABNORMAL HIGH (ref 65–99)
POTASSIUM: 2.4 mmol/L — AB (ref 3.5–5.1)
Potassium: 2.9 mmol/L — ABNORMAL LOW (ref 3.5–5.1)
SODIUM: 143 mmol/L (ref 135–145)
Sodium: 142 mmol/L (ref 135–145)
TOTAL PROTEIN: 6.2 g/dL — AB (ref 6.5–8.1)
Total Bilirubin: 1 mg/dL (ref 0.3–1.2)
Total Protein: 5.4 g/dL — ABNORMAL LOW (ref 6.5–8.1)

## 2017-05-21 LAB — CBC
HEMATOCRIT: 33.9 % — AB (ref 39.0–52.0)
HEMOGLOBIN: 11 g/dL — AB (ref 13.0–17.0)
MCH: 27.8 pg (ref 26.0–34.0)
MCHC: 32.4 g/dL (ref 30.0–36.0)
MCV: 85.6 fL (ref 78.0–100.0)
Platelets: 296 10*3/uL (ref 150–400)
RBC: 3.96 MIL/uL — ABNORMAL LOW (ref 4.22–5.81)
RDW: 16.9 % — AB (ref 11.5–15.5)
WBC: 15.1 10*3/uL — ABNORMAL HIGH (ref 4.0–10.5)

## 2017-05-21 LAB — I-STAT CG4 LACTIC ACID, ED: LACTIC ACID, VENOUS: 1.63 mmol/L (ref 0.5–1.9)

## 2017-05-21 LAB — MAGNESIUM: Magnesium: 1.7 mg/dL (ref 1.7–2.4)

## 2017-05-21 LAB — TSH: TSH: 0.723 u[IU]/mL (ref 0.350–4.500)

## 2017-05-21 LAB — LACTIC ACID, PLASMA: Lactic Acid, Venous: 1.5 mmol/L (ref 0.5–1.9)

## 2017-05-21 MED ORDER — MORPHINE SULFATE (PF) 4 MG/ML IV SOLN: 1.0000 mg | INTRAVENOUS | Status: DC | PRN

## 2017-05-21 MED ORDER — ONDANSETRON HCL 4 MG/2ML IJ SOLN
4.0000 mg | Freq: Once | INTRAMUSCULAR | Status: AC
  Administered 2017-05-21: 4 mg via INTRAVENOUS
  Filled 2017-05-21: qty 2

## 2017-05-21 MED ORDER — BOOST PO LIQD
237.0000 mL | Freq: Two times a day (BID) | ORAL | Status: DC
  Administered 2017-05-21 – 2017-05-23 (×4): 237 mL via ORAL
  Filled 2017-05-21 (×7): qty 237

## 2017-05-21 MED ORDER — PANTOPRAZOLE SODIUM 40 MG PO TBEC: 40.0000 mg | DELAYED_RELEASE_TABLET | Freq: Every day | ORAL | Status: DC

## 2017-05-21 MED ORDER — POTASSIUM CHLORIDE 10 MEQ/100ML IV SOLN
10.0000 meq | INTRAVENOUS | Status: AC
  Administered 2017-05-21 (×3): 10 meq via INTRAVENOUS
  Filled 2017-05-21 (×2): qty 100

## 2017-05-21 MED ORDER — POTASSIUM CHLORIDE CRYS ER 20 MEQ PO TBCR
40.0000 meq | EXTENDED_RELEASE_TABLET | Freq: Once | ORAL | Status: AC
  Administered 2017-05-21: 40 meq via ORAL
  Filled 2017-05-21: qty 2

## 2017-05-21 MED ORDER — HEPARIN SODIUM (PORCINE) 5000 UNIT/ML IJ SOLN
5000.0000 [IU] | Freq: Three times a day (TID) | INTRAMUSCULAR | Status: DC
  Administered 2017-05-21: 5000 [IU] via SUBCUTANEOUS
  Filled 2017-05-21: qty 1

## 2017-05-21 MED ORDER — PANTOPRAZOLE SODIUM 40 MG IV SOLR
40.0000 mg | INTRAVENOUS | Status: DC
  Administered 2017-05-21 – 2017-05-23 (×3): 40 mg via INTRAVENOUS
  Filled 2017-05-21 (×4): qty 40

## 2017-05-21 MED ORDER — POTASSIUM CHLORIDE IN NACL 40-0.9 MEQ/L-% IV SOLN
INTRAVENOUS | Status: AC
Start: 2017-05-21 — End: 2017-05-21
  Administered 2017-05-21: 75 mL/h via INTRAVENOUS
  Filled 2017-05-21 (×2): qty 1000

## 2017-05-21 MED ORDER — POTASSIUM CHLORIDE IN NACL 20-0.9 MEQ/L-% IV SOLN
INTRAVENOUS | Status: DC
  Administered 2017-05-21: 04:00:00 via INTRAVENOUS
  Filled 2017-05-21: qty 1000

## 2017-05-21 MED ORDER — MIDODRINE HCL 5 MG PO TABS
5.0000 mg | ORAL_TABLET | Freq: Two times a day (BID) | ORAL | Status: DC
  Administered 2017-05-21 – 2017-05-23 (×6): 5 mg via ORAL
  Filled 2017-05-21 (×7): qty 1

## 2017-05-21 MED ORDER — POLYSACCHARIDE IRON COMPLEX 150 MG PO CAPS
150.0000 mg | ORAL_CAPSULE | Freq: Two times a day (BID) | ORAL | Status: DC
  Administered 2017-05-21 – 2017-05-23 (×4): 150 mg via ORAL
  Filled 2017-05-21 (×8): qty 1

## 2017-05-21 MED ORDER — APIXABAN 2.5 MG PO TABS
2.5000 mg | ORAL_TABLET | Freq: Two times a day (BID) | ORAL | Status: DC
  Administered 2017-05-21 – 2017-05-23 (×4): 2.5 mg via ORAL
  Filled 2017-05-21 (×6): qty 1

## 2017-05-21 MED ORDER — ONDANSETRON HCL 4 MG/2ML IJ SOLN: 4.0000 mg | Freq: Four times a day (QID) | INTRAMUSCULAR | Status: DC | PRN

## 2017-05-21 MED ORDER — POTASSIUM CHLORIDE IN NACL 40-0.9 MEQ/L-% IV SOLN
INTRAVENOUS | Status: DC
  Administered 2017-05-21 – 2017-05-22 (×3): 75 mL/h via INTRAVENOUS
  Filled 2017-05-21 (×5): qty 1000

## 2017-05-21 MED ORDER — ATORVASTATIN CALCIUM 10 MG PO TABS
10.0000 mg | ORAL_TABLET | Freq: Every evening | ORAL | Status: DC
  Administered 2017-05-21 – 2017-05-23 (×3): 10 mg via ORAL
  Filled 2017-05-21 (×4): qty 1

## 2017-05-21 MED ORDER — MAGNESIUM SULFATE 2 GM/50ML IV SOLN
2.0000 g | Freq: Once | INTRAVENOUS | Status: AC
  Administered 2017-05-21: 2 g via INTRAVENOUS
  Filled 2017-05-21: qty 50

## 2017-05-21 MED ORDER — SODIUM CHLORIDE 0.9 % IV BOLUS (SEPSIS)
1000.0000 mL | Freq: Once | INTRAVENOUS | Status: AC
  Administered 2017-05-21: 1000 mL via INTRAVENOUS

## 2017-05-21 MED ORDER — SERTRALINE HCL 100 MG PO TABS
100.0000 mg | ORAL_TABLET | Freq: Every evening | ORAL | Status: DC
  Administered 2017-05-21 – 2017-05-23 (×3): 100 mg via ORAL
  Filled 2017-05-21 (×4): qty 1

## 2017-05-21 NOTE — Progress Notes (Addendum)
Patient was seen and examined in ER.  Admitted after midnight.  Please see H&P for detail. 84 year old gentleman with history of DVT, hyperlipidemia, paroxysmal A. fib, chronic kidney disease stage III, hypertension, recent hospitalization for deep ulcer to the left calf which was treated with wound VAC and subsequently skin graft which is healing well.  Patient is now presented with nausea vomiting and abdominal pain found to have acute pink otitis. CT scan of abdomen pelvis with acute pancreatitis without complication.  Pain is better controlled with the current medication.  Continue IV fluid, IV pain medication.  Start clear liquid, Protonix IV. -Check lipid panel in a.m. -Replete both potassium and magnesium.  Repeat lab in the morning. -Monitor renal function. -Resume Eliquis, pharmacy consulted.  Lungs clear bilateral Abdomen soft, epigastric tenderness.  Not distended Patient is alert awake sitting in bed comfortable.

## 2017-05-21 NOTE — ED Notes (Signed)
Pt given Boost

## 2017-05-21 NOTE — ED Notes (Signed)
Admitting MD at bedside.

## 2017-05-21 NOTE — ED Notes (Signed)
Nurse will collect labs. 
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
## 2017-05-21 NOTE — ED Notes (Signed)
Pt's Son  Raistlin Gum   [phone]

## 2017-05-21 NOTE — Progress Notes (Signed)
RN paged NP to inquire if patient needs CBGs ordered due to PMH Type 2 DM, awaiting response.  P.J. Linus Mako, RN
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
## 2017-05-21 NOTE — Progress Notes (Signed)
ANTICOAGULATION CONSULT NOTE - Initial Consult  Pharmacy Consult:  Eliquis Indication: atrial fibrillation  No Known Allergies  Patient Measurements:    Vital Signs: BP: 147/67 (03/24 1215) Pulse Rate: 78 (03/24 1215)  Labs: Recent Labs    05/20/17 2246 05/21/17 0415  HGB 12.5* 11.0*  HCT 38.7* 33.9*  PLT 391 296  CREATININE 1.99* 1.91*    CrCl cannot be calculated (Unknown ideal weight.).   Medical History: Past Medical History:  Diagnosis Date  . Arthritis   . Atherosclerosis of artery of extremity with ulceration ([place])    Treated at wound clinic in [place]  . CKD (chronic kidney disease) stage 3, GFR 30-59 ml/min (HCC)   . Diverticulosis   . DVT (deep venous thrombosis) ([place])   . Essential hypertension, benign   . GERD (gastroesophageal reflux disease)   . Hemorrhage 01/2010   Spontaneous hemorrhage of right thigh with possible over anticoagulation  . History of DVT of lower extremity 2007   Recurrent X 4, last one 01/2010 (after spontaneous hemorrhage of thigh)  . History of skin cancer   . Hyperlipidemia   . Paroxysmal atrial fibrillation (HCC)   . Renal calculus, right   . Renal cyst, left   . Spigelian hernia   . Type 2 diabetes mellitus ([place])   . Varicose veins      Assessment: [name] presented with nausea and vomiting, no hematemesis, found to have acute pancreatitis without complications.  Pharmacy consulted to continue Eliquis from PTA for history of Afib.  Patient is > 66 year old and her SCr is > 1.5, qualifying for reduced Eliquis dosing.  LFTs WNL and H/H/plts are stable.  No bleeding reported.  Noted that patient received heparin SQ earlier today.   Goal of Therapy:  Appropriate anticoagulation Monitor platelets by anticoagulation protocol: Yes    Plan:  D/C heparin SQ Eliquis 2.5mg  PO BID Pharmacy will sign off and follow peripherally.  Thank you for the consult!   Carlee Tesfaye D. Mina Marble, PharmD, BCPS Pager:  [phone] 05/21/2017, 1:47  PM

## 2017-05-21 NOTE — ED Notes (Signed)
Pt taken to CT.

## 2017-05-21 NOTE — ED Notes (Signed)
Still unable to urinate.  11cc's noted via bladder scan.

## 2017-05-21 NOTE — ED Notes (Signed)
Pt bladder scan 52mL.

## 2017-05-22 DIAGNOSIS — E876 Hypokalemia: Secondary | ICD-10-CM

## 2017-05-22 LAB — BASIC METABOLIC PANEL
Anion gap: 9 (ref 5–15)
BUN: 24 mg/dL — AB (ref 6–20)
CO2: 22 mmol/L (ref 22–32)
CREATININE: 1.47 mg/dL — AB (ref 0.61–1.24)
Calcium: 8.2 mg/dL — ABNORMAL LOW (ref 8.9–10.3)
Chloride: 113 mmol/L — ABNORMAL HIGH (ref 101–111)
GFR, EST AFRICAN AMERICAN: 47 mL/min — AB (ref >60)
GFR, EST NON AFRICAN AMERICAN: 41 mL/min — AB (ref >60)
Glucose, Bld: 147 mg/dL — ABNORMAL HIGH (ref 65–99)
Potassium: 3.7 mmol/L (ref 3.5–5.1)
SODIUM: 144 mmol/L (ref 135–145)

## 2017-05-22 LAB — CBC
HEMATOCRIT: 34.2 % — AB (ref 39.0–52.0)
Hemoglobin: 10.8 g/dL — ABNORMAL LOW (ref 13.0–17.0)
MCH: 27 pg (ref 26.0–34.0)
MCHC: 31.6 g/dL (ref 30.0–36.0)
MCV: 85.5 fL (ref 78.0–100.0)
PLATELETS: 251 10*3/uL (ref 150–400)
RBC: 4 MIL/uL — AB (ref 4.22–5.81)
RDW: 17.3 % — ABNORMAL HIGH (ref 11.5–15.5)
WBC: 12.5 10*3/uL — AB (ref 4.0–10.5)

## 2017-05-22 LAB — LIPID PANEL
CHOLESTEROL: 97 mg/dL (ref 0–200)
HDL: 36 mg/dL — AB (ref >40)
LDL CALC: 49 mg/dL (ref 0–99)
TRIGLYCERIDES: 60 mg/dL (ref <150)
Total CHOL/HDL Ratio: 2.7 RATIO
VLDL: 12 mg/dL (ref 0–40)

## 2017-05-22 LAB — LIPASE, BLOOD: Lipase: 114 U/L — ABNORMAL HIGH (ref 11–51)

## 2017-05-22 LAB — GASTROINTESTINAL PANEL BY PCR, STOOL (REPLACES STOOL CULTURE)
ASTROVIRUS: NOT DETECTED
Adenovirus F40/41: NOT DETECTED
CAMPYLOBACTER SPECIES: NOT DETECTED
Cryptosporidium: NOT DETECTED
Cyclospora cayetanensis: NOT DETECTED
ENTAMOEBA HISTOLYTICA: NOT DETECTED
ENTEROTOXIGENIC E COLI (ETEC): NOT DETECTED
Enteroaggregative E coli (EAEC): NOT DETECTED
Enteropathogenic E coli (EPEC): NOT DETECTED
Giardia lamblia: NOT DETECTED
NOROVIRUS GI/GII: NOT DETECTED
Plesimonas shigelloides: NOT DETECTED
ROTAVIRUS A: NOT DETECTED
SAPOVIRUS (I, II, IV, AND V): DETECTED — AB
SHIGELLA/ENTEROINVASIVE E COLI (EIEC): NOT DETECTED
Salmonella species: NOT DETECTED
Shiga like toxin producing E coli (STEC): NOT DETECTED
VIBRIO CHOLERAE: NOT DETECTED
Vibrio species: NOT DETECTED
Yersinia enterocolitica: NOT DETECTED

## 2017-05-22 LAB — MAGNESIUM: Magnesium: 1.9 mg/dL (ref 1.7–2.4)

## 2017-05-22 NOTE — Progress Notes (Signed)
[facility]  Patient Status: Active (receiving services up to time of hospitalization)  [facility] is providing the following services: RN, OT and MSW  If patient discharges after hours, please call [phone].   Don Hall 05/22/2017, 10:38 AM
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
## 2017-05-22 NOTE — Progress Notes (Signed)
Patient has urinated total of 250 ml tea colored urine so far this shift.  Patient was bladder scanned and 130 ml urine noted. P.J. Linus Mako, RN

## 2017-05-22 NOTE — Evaluation (Signed)
Physical Therapy Evaluation Patient Details Name: Don Hall. MRN: [id number] DOB: [birth year] Today's Date: 05/22/2017   History of Present Illness  84 year old gentleman with history of DVT, hyperlipidemia, paroxysmal A. fib, chronic kidney disease stage III, hypertension, recent hospitalization for deep ulcer to the left calf which was treated with wound VAC and subsequently skin graft which is healing well.  Patient is now presented with nausea vomiting and abdominal pain found to have acute pancreatitis.  Clinical Impression  Patient presents with decreased independence with mobility but has been requiring assist at home recently and has family in and out to assist as well.  Feel he can continue to be managed at home with up to 24 hour assist and is a good candidate for [place] program.  PT to follow acutely.  Follow Up Recommendations Home health PT;Supervision/Assistance - 24 hour    Equipment Recommendations  None recommended by PT    Recommendations for Other Services       Precautions / Restrictions Precautions Precautions: Fall      Mobility  Bed Mobility Overal bed mobility: Needs Assistance Bed Mobility: Supine to Sit;Sit to Supine     Supine to sit: HOB elevated;Mod assist Sit to supine: Max assist   General bed mobility comments: assist to lift trunk, pt able to bring legs off bed, to supine, assist for legs onto bed and to guide trunk  Transfers Overall transfer level: Needs assistance   Transfers: Lateral/Scoot Transfers          Lateral/Scoot Transfers: Max assist General transfer comment: scooting on EOB toward HOB cues for anterior weight shift, for technique and foot placement, moved up on pad under pt  Ambulation/Gait                Stairs            Wheelchair Mobility    Modified Rankin (Stroke Patients Only)       Balance Overall balance assessment: Needs assistance   Sitting balance-Leahy Scale:  Fair Sitting balance - Comments: sitting EOB tremulous due to weakness, about 8 minutes with minimal UE support                                     Pertinent Vitals/Pain Pain Assessment: Faces Faces Pain Scale: Hurts little more Pain Location: wounds on legs Pain Descriptors / Indicators: Tender Pain Intervention(s): Monitored during session;Repositioned;Limited activity within patient's tolerance    Home Living Family/patient expects to be discharged to:: Private residence Living Arrangements: Spouse/significant other Available Help at Discharge: Family Type of Home: House Home Access: Stairs to enter Entrance Stairs-Rails: Right;Left;Can reach both Entrance Stairs-Number of Steps: 2 in front; 3 on side Home Layout: One level Home Equipment: Walker - 4 wheels;Bedside commode;Shower seat;Wheelchair - manual      Prior Function Level of Independence: Needs assistance   Gait / Transfers Assistance Needed: reports has not been able to walk since in SNF several time recently.  States sons help transfer him to [facility] when they are there  ADL's / Homemaking Assistance Needed: has aides that help 11 hours 5 days a week and daughter who is an Therapist, sports comes most weekends        Hand Dominance   Dominant Hand: Right    Extremity/Trunk Assessment   Upper Extremity Assessment Upper Extremity Assessment: LUE deficits/detail;RUE deficits/detail RUE Deficits / Details: AROM grossly WFL, strength grossly  4/5 LUE Deficits / Details: shoulder flexion only with assist to about 90 in supine with some pain if "lifting over his ear", elbow flexion 4/5, extension 2/5    Lower Extremity Assessment Lower Extremity Assessment: RLE deficits/detail;LLE deficits/detail RLE Deficits / Details: AROM reports stiffness in knees due to in bed too much, strength grossly 3/5 LLE Deficits / Details: AROM reports stiffness in knees due to in bed too much, strength grossly 3/5       Communication    Communication: No difficulties  Cognition Arousal/Alertness: Awake/alert Behavior During Therapy: WFL for tasks assessed/performed Overall Cognitive Status: Within Functional Limits for tasks assessed                                        General Comments General comments (skin integrity, edema, etc.): noted donor site from skin graft back of R thigh, L lower leg graft site covered with banage and tape small amount of drainage; also with heel ulcer R heel    Exercises     Assessment/Plan    PT Assessment Patient needs continued PT services  PT Problem List Decreased strength;Decreased mobility;Decreased activity tolerance;Decreased balance;Decreased knowledge of use of DME;Cardiopulmonary status limiting activity;Decreased safety awareness       PT Treatment Interventions DME instruction;Therapeutic activities;Therapeutic exercise;Patient/family education;Balance training;Wheelchair mobility training;Functional mobility training    PT Goals (Current goals can be found in the Care Plan section)  Acute Rehab PT Goals Patient Stated Goal: To return home PT Goal Formulation: With patient Time For Goal Achievement: 06/05/17 Potential to Achieve Goals: Fair    Frequency Min 3X/week   Barriers to discharge        Co-evaluation               AM-PAC PT "6 Clicks" Daily Activity  Outcome Measure Difficulty turning over in bed (including adjusting bedclothes, sheets and blankets)?: Unable Difficulty moving from lying on back to sitting on the side of the bed? : Unable Difficulty sitting down on and standing up from a chair with arms (e.g., wheelchair, bedside commode, etc,.)?: Unable Help needed moving to and from a bed to chair (including a wheelchair)?: Total Help needed walking in hospital room?: Total Help needed climbing 3-5 steps with a railing? : Total 6 Click Score: 6    End of Session Equipment Utilized During Treatment: Gait belt Activity  Tolerance: Patient limited by fatigue Patient left: with call bell/phone within reach;in bed;with bed alarm set   PT Visit Diagnosis: Other abnormalities of gait and mobility (R26.89);Muscle weakness (generalized) (M62.81)    Time: 5400-8676 PT Time Calculation (min) (ACUTE ONLY): 30 min   Charges:   PT Evaluation $PT Eval Moderate Complexity: 1 Mod PT Treatments $Therapeutic Activity: 8-22 mins   PT G CodesMagda Kiel, [place] [phone] 05/22/2017   Reginia Naas 05/22/2017, 2:14 PM

## 2017-05-22 NOTE — Progress Notes (Signed)
PROGRESS NOTE    Don Hall.  NWG:[id number] DOB: 09-21-1927 DOA: 05/20/2017 PCP: Caryl Bis, MD   Brief Narrative: 84 year old gentleman with history of DVT, hyperlipidemia, paroxysmal A. fib, chronic kidney disease stage III, hypertension, recent hospitalization for deep ulcer to the left calf which was treated with wound VAC and subsequently skin graft which is healing well.  Patient is now presented with nausea vomiting and abdominal pain found to have acute pancreatitis.  Assessment & Plan:   #Acute uncomplicated pancreatitis contributing to nausea vomiting: She reported that his nausea vomiting and pain is better today.  Advance diet to full liquid.  Continue Protonix, IV fluid and supportive care.  Follow-up lipase level. -Patient denied drinking alcohol -CT scan of abdomen pelvis showed no biliary dilatation or a space-occupying lesion.  Patient has a history of cholecystectomy.  #Hypokalemia: Follow-up BMP.  Replete as needed  #Acute kidney injury on chronic kidney disease stage III: Encourage oral intake, on IV fluid.  Monitor BMP.  Monitor urine output.  #Diarrhea: Stool positive for Sapovirus.  On precaution.  Provide supportive care  #Paroxysmal atrial fibrillation: On Eliquis.  #Hyperlipidemia on Lipitor  PT OT evaluation  DVT prophylaxis:Eliquis Code Status:full code Family Communication: No family at bedside Disposition Plan: Likely discharge home in 1-2 days    Consultants:   None  Procedures: None Antimicrobials: None  Subjective: Seen and examined at bedside.  Reported that his pain is better today.  Denies nausea vomiting.  Tolerated clear liquid diet.  Objective: Vitals:   05/21/17 1818 05/21/17 2100 05/21/17 2310 05/22/17 0500  BP: (!) 126/42 (!) 123/30 120/60 140/60  Pulse: 67 (!) 54  (!) 54  Resp: 18 18  18   Temp: 97.6 F (36.4 C) (!) 97.5 F (36.4 C)  98.2 F (36.8 C)  TempSrc: Oral Oral  Axillary  SpO2: 99% 98%  98%    Weight:      Height:        Intake/Output Summary (Last 24 hours) at 05/22/2017 1131 Last data filed at 05/22/2017 1012 Gross per 24 hour  Intake 1813.75 ml  Output 425 ml  Net 1388.75 ml   Filed Weights   05/21/17 1817  Weight: 75.2 kg (165 lb 12.6 oz)    Examination:  General exam: Appears calm and comfortable  Respiratory system: Clear to auscultation. Respiratory effort normal. No wheezing or crackle Cardiovascular system: S1 & S2 heard, RRR.  No pedal edema. Gastrointestinal system: Abdomen is nondistended, soft and nontender. Normal bowel sounds heard. Central nervous system: Alert and oriented. No focal neurological deficits. Extremities: Symmetric 5 x 5 power. Skin: No rashes, lesions or ulcers Psychiatry: Judgement and insight appear normal. Mood & affect appropriate.     Data Reviewed: I have personally reviewed following labs and imaging studies  CBC: Recent Labs  Lab 05/20/17 2246 05/21/17 0415 05/22/17 1039  WBC 19.6* 15.1* 12.5*  HGB 12.5* 11.0* 10.8*  HCT 38.7* 33.9* 34.2*  MCV 86.2 85.6 85.5  PLT 391 296 578   Basic Metabolic Panel: Recent Labs  Lab 05/20/17 2246 05/21/17 0349 05/21/17 0415  NA 142  --  143  K 2.4*  --  2.9*  CL 103  --  105  CO2 25  --  27  GLUCOSE 216*  --  149*  BUN 24*  --  25*  CREATININE 1.99*  --  1.91*  CALCIUM 8.5*  --  7.9*  MG  --  1.7  --    GFR: Estimated  Creatinine Clearance: 27.6 mL/min (A) (by C-G formula based on SCr of 1.91 mg/dL (H)). Liver Function Tests: Recent Labs  Lab 05/20/17 2246 05/21/17 0415  AST 21 22  ALT 15* 12*  ALKPHOS 129* 111  BILITOT 0.8 1.0  PROT 6.2* 5.4*  ALBUMIN 2.9* 2.6*   Recent Labs  Lab 05/20/17 2246 05/21/17 0415  LIPASE 673* 306*   No results for input(s): AMMONIA in the last 168 hours. Coagulation Profile: No results for input(s): INR, PROTIME in the last 168 hours. Cardiac Enzymes: No results for input(s): CKTOTAL, CKMB, CKMBINDEX, TROPONINI in the last  168 hours. BNP (last 3 results) No results for input(s): PROBNP in the last 8760 hours. HbA1C: No results for input(s): HGBA1C in the last 72 hours. CBG: No results for input(s): GLUCAP in the last 168 hours. Lipid Profile: No results for input(s): CHOL, HDL, LDLCALC, TRIG, CHOLHDL, LDLDIRECT in the last 72 hours. Thyroid Function Tests: Recent Labs    05/21/17 0415  TSH 0.723   Anemia Panel: No results for input(s): VITAMINB12, FOLATE, FERRITIN, TIBC, IRON, RETICCTPCT in the last 72 hours. Sepsis Labs: Recent Labs  Lab 05/20/17 2313 05/21/17 0224 05/21/17 1943  LATICACIDVEN 3.42* 1.63 1.5    Recent Results (from the past 240 hour(s))  C difficile quick scan w PCR reflex     Status: None   Collection Time: 05/21/17  9:00 AM  Result Value Ref Range Status   C Diff antigen NEGATIVE NEGATIVE Final   C Diff toxin NEGATIVE NEGATIVE Final   C Diff interpretation No C. difficile detected.  Final    Comment: Performed at [facility], [place] [address], [place], [place] [address]  Gastrointestinal Panel by PCR , Stool     Status: Abnormal   Collection Time: 05/21/17  9:00 AM  Result Value Ref Range Status   Campylobacter species NOT DETECTED NOT DETECTED Final   Plesimonas shigelloides NOT DETECTED NOT DETECTED Final   Salmonella species NOT DETECTED NOT DETECTED Final   Yersinia enterocolitica NOT DETECTED NOT DETECTED Final   Vibrio species NOT DETECTED NOT DETECTED Final   Vibrio cholerae NOT DETECTED NOT DETECTED Final   Enteroaggregative E coli (EAEC) NOT DETECTED NOT DETECTED Final   Enteropathogenic E coli (EPEC) NOT DETECTED NOT DETECTED Final   Enterotoxigenic E coli (ETEC) NOT DETECTED NOT DETECTED Final   Shiga like toxin producing E coli (STEC) NOT DETECTED NOT DETECTED Final   Shigella/Enteroinvasive E coli (EIEC) NOT DETECTED NOT DETECTED Final   Cryptosporidium NOT DETECTED NOT DETECTED Final   Cyclospora cayetanensis NOT DETECTED NOT DETECTED Final    Entamoeba histolytica NOT DETECTED NOT DETECTED Final   Giardia lamblia NOT DETECTED NOT DETECTED Final   Adenovirus F40/41 NOT DETECTED NOT DETECTED Final   Astrovirus NOT DETECTED NOT DETECTED Final   Norovirus GI/GII NOT DETECTED NOT DETECTED Final   Rotavirus A NOT DETECTED NOT DETECTED Final   Sapovirus (I, II, IV, and V) DETECTED (A) NOT DETECTED Final    Comment: Performed at [facility], [address], [place], [place] [address]         Radiology Studies: Ct Abdomen Pelvis Wo Contrast  Result Date: 05/21/2017 CLINICAL DATA:  84 year old male with bilateral mid abdominal pain and vomiting. EXAM: CT ABDOMEN AND PELVIS WITHOUT CONTRAST TECHNIQUE: Multidetector CT imaging of the abdomen and pelvis was performed following the standard protocol without IV contrast. COMPARISON:  01/11/2017 FINDINGS: Lower chest: Mild cardiac enlargement with coronary arteriosclerosis. No pericardial effusion. Mild  bronchiectasis in the lower lobes with subsegmental atelectasis. Hepatobiliary: Cholecystectomy. No biliary dilatation or space-occupying mass. Colonic interposition is not noted between the liver and ventral abdominal wall. Pancreas: Hazy peripancreatic mesenteric fatty induration suspicious for uncomplicated acute pancreatitis. No mass or ductal dilatation. Spleen: Normal Adrenals/Urinary Tract: Normal bilateral adrenal glands. Bilateral renal cysts are noted the largest in the upper pole of the left kidney measuring 3.7 x 5 x 4 cm. Bilateral renal pelvic calculi are noted without hydronephrosis as well as renal vascular calcifications on the left. The largest calculus in the left renal pelvis, slightly staghorn appearance measures approximately 14 x 11 x 8 mm. The largest in the right renal pelvis measures 4 mm. Stomach/Bowel: Stomach is within normal limits. Appendix appears not confidently identified but no pericecal inflammation is seen. No evidence of bowel wall thickening, distention,  or inflammatory changes. Moderate amount of retained stool within the rectosigmoid. Vascular/Lymphatic: Mild to moderate aortoiliac and branch vessel atherosclerosis without aneurysm. No adenopathy by CT size criteria. Reproductive: Normal size prostate and seminal vesicles. Other: No free air nor free fluid. Fat noted within both inguinal canals. Musculoskeletal: Diffuse idiopathic skeletal hyperostosis. Thoracolumbar spondylosis with multilevel degenerative disc disease and facet arthropathy. IMPRESSION: 1. Mild cardiomegaly with coronary arteriosclerosis and lower lobe bronchiectasis. 2. Hazy peripancreatic fat suspicious for changes of acute uncomplicated pancreatitis. 3. Bilateral renal pelvic and left intrarenal calculi without hydronephrosis. Bilateral renal cysts. 4. Moderate stool burden in the rectosigmoid question fecal impaction. Electronically Signed   By: Ashley Royalty M.D.   On: 05/21/2017 01:25   Dg Chest 2 View  Result Date: 05/21/2017 CLINICAL DATA:  Weakness and vomiting. EXAM: CHEST - 2 VIEW COMPARISON:  Radiograph 04/23/2017. Lung bases from abdominal CT 1 hour prior. FINDINGS: Unchanged heart size and mediastinal contours with aortic atherosclerosis. Mild bibasilar atelectasis. Pulmonary vasculature is normal. No consolidation, pleural effusion, or pneumothorax. No acute osseous abnormalities are seen. IMPRESSION: Mild bibasilar atelectasis. Electronically Signed   By: Jeb Levering M.D.   On: 05/21/2017 01:40        Scheduled Meds: . apixaban  2.5 mg Oral BID  . atorvastatin  10 mg Oral QPM  . iron polysaccharides  150 mg Oral BID  . lactose free nutrition  237 mL Oral BID BM  . midodrine  5 mg Oral BID WC  . pantoprazole (PROTONIX) IV  40 mg Intravenous Q24H  . sertraline  100 mg Oral QPM   Continuous Infusions: . 0.9 % NaCl with KCl 40 mEq / L 75 mL/hr (05/22/17 1128)     LOS: 1 day    Dron Tanna Furry, MD [facility] Pager [phone]  If 7PM-7AM,  please contact night-coverage [email or web address] Password [facility] 05/22/2017, 11:31 AM

## 2017-05-22 NOTE — Care Management Note (Addendum)
Case Management Note  Patient Details  Name: Don Hall. MRN: [id number] Date of Birth: 1927/08/28  Subjective/Objective:    Admitted with acute pancreatitis, hx of DVT, hyperlipidemia, paroxysmal A. fib, chronic kidney disease stage III, hypertension, recent hospitalization for deep ulcer to the left calf. Pt states have been home for about ? 6 weeks, recently d/c from SNF. Resides with wife. States unable to stand/walk. States has 2 sons that live close by and help assist him with toiletry needs in the am and pm. PTA [facility] provided pt with home health services  (RN,OT,SW). DME: wheelchair, walker.  [name] (Daughter) [name] (Son)  [name] (spouse)   [phone]    PCP: [name]  Action/Plan: PT/OT evals pending...Marland KitchenCM to f/u with transitional care needs  Expected Discharge Date:                  Expected Discharge Plan:  [place]  In-House Referral:     Discharge planning Services  CM Consult  Post Acute Care Choice:  Resumption of Svcs/PTA Provider, Home Health(Active with [facility] ( RN,OT,SW)) Choice offered to:  Patient  DME Arranged:    DME Agency:     HH Arranged:    [place] Agency:     Status of Service:  In process, will continue to follow  If discussed at Long Length of Stay Meetings, dates discussed:    Additional Comments: 05/22/2017 1530 NCM made referral to [facility] for [facility].  Whitman Hero [place], [place] 05/22/2017, 12:17 PM

## 2017-05-22 NOTE — Consult Note (Signed)
   [facility] CM Inpatient Consult   05/22/2017  Don Hall October 04, 1927 [id number]     Patient screened for [place] Management program services due to recent hospital readmission and high risk of unplanned readmission score.  Spoke with Don Hall at bedside about [place] Management program. He is agreeable and written consent obtained for [facility] Care Management. [facility] Care Management folder provided with contact information.  PT/OT pending. Unsure of disposition plans at this point. Reports he is active with [facility].  Confirmed Primary Care MD is Dr. Quillian Quince with [place]. This practice is listed as performing their own transition of care. Don Hall lives with his wife.  States he uses [facility] for transportation. Denies concerns with obtaining medications.  Explained [facility] Care Management will not interfere or replace services provided by home health.  Discussed that writer will continue to follow and make appropriate [facility] Care Management referrals once disposition plans are known.  Made inpatient RNCM aware [facility] Care Management following.   Don Rolling, MSN-Ed, RN,BSN [facility] Liaison [phone]

## 2017-05-23 ENCOUNTER — Other Ambulatory Visit: Payer: Self-pay

## 2017-05-23 ENCOUNTER — Encounter (HOSPITAL_COMMUNITY): Payer: Self-pay

## 2017-05-23 DIAGNOSIS — N183 Chronic kidney disease, stage 3 (moderate): Secondary | ICD-10-CM

## 2017-05-23 LAB — CBC
HCT: 31.4 % — ABNORMAL LOW (ref 39.0–52.0)
Hemoglobin: 9.8 g/dL — ABNORMAL LOW (ref 13.0–17.0)
MCH: 26.8 pg (ref 26.0–34.0)
MCHC: 31.2 g/dL (ref 30.0–36.0)
MCV: 85.8 fL (ref 78.0–100.0)
PLATELETS: 258 10*3/uL (ref 150–400)
RBC: 3.66 MIL/uL — AB (ref 4.22–5.81)
RDW: 17.2 % — ABNORMAL HIGH (ref 11.5–15.5)
WBC: 10.5 10*3/uL (ref 4.0–10.5)

## 2017-05-23 LAB — BASIC METABOLIC PANEL
Anion gap: 8 (ref 5–15)
BUN: 20 mg/dL (ref 6–20)
CALCIUM: 8.1 mg/dL — AB (ref 8.9–10.3)
CO2: 25 mmol/L (ref 22–32)
Chloride: 112 mmol/L — ABNORMAL HIGH (ref 101–111)
Creatinine, Ser: 1.25 mg/dL — ABNORMAL HIGH (ref 0.61–1.24)
GFR calc non Af Amer: 50 mL/min — ABNORMAL LOW (ref >60)
GFR, EST AFRICAN AMERICAN: 57 mL/min — AB (ref >60)
Glucose, Bld: 117 mg/dL — ABNORMAL HIGH (ref 65–99)
Potassium: 3.7 mmol/L (ref 3.5–5.1)
SODIUM: 145 mmol/L (ref 135–145)

## 2017-05-23 NOTE — Discharge Summary (Addendum)
Physician Discharge Summary  Archie Endo. NIO:[id number] DOB: 1928-01-01 DOA: 05/20/2017  PCP: Caryl Bis, MD  Admit date: 05/20/2017 Discharge date: 05/23/2017  Admitted From:home Disposition:home  Recommendations for Outpatient Follow-up:  1. Follow up with PCP in 1-2 weeks 2. Please obtain BMP/CBC in one week  Home Health:yes Equipment/Devices:none Discharge Condition:stable CODE STATUS:full code Diet recommendation:heart healthy  Brief/Interim Summary: 84 year old gentleman with history of DVT, hyperlipidemia, paroxysmal A. fib, chronic kidney disease stage III, hypertension, recent hospitalization for deep ulcer to the left calf which was treated with wound VAC and subsequently skin graft which is healing well. Patient is now presented with nausea vomiting and abdominal pain found to have acute pancreatitis.  #Acute uncomplicated pancreatitis contributing to nausea vomiting:  -Treated with IV fluid, bowel rest, and pain management.  Clinically improved.  Able to tolerate diet.  Abdomen exam benign.  No nausea vomiting. -CT scan of abdomen pelvis showed no biliary dilatation or a space-occupying lesion.  Patient has a history of cholecystectomy. -Patient denied drinking alcohol.  Lipid panel acceptable.  Exact cause unknown.  #Hypokalemia: Improved  #Acute kidney injury on chronic kidney disease stage III: Encourage oral intake.  Renal function improved. #Diarrhea: Stool positive for Sapovirus.  On precaution.  Provide supportive care.  No diarrhea.  #Paroxysmal atrial fibrillation: On Eliquis.  #Hyperlipidemia on Lipitor  Patient is clinically improved.  Continue home medication.  Stable on discharge.  Discharge Diagnoses:  Principal Problem:   Pancreatitis, acute Active Problems:   Hyperlipemia   Essential hypertension, benign   Long term (current) use of anticoagulants   Chronic kidney disease, stage 3 (HCC)   Anemia of chronic disease    Hypokalemia   Nausea & vomiting   AKI (acute kidney injury) ([place])   Lactic acidosis    Discharge Instructions  Discharge Instructions    Call MD for:  difficulty breathing, headache or visual disturbances   Complete by:  As directed    Call MD for:  extreme fatigue   Complete by:  As directed    Call MD for:  hives   Complete by:  As directed    Call MD for:  persistant dizziness or light-headedness   Complete by:  As directed    Call MD for:  persistant nausea and vomiting   Complete by:  As directed    Call MD for:  severe uncontrolled pain   Complete by:  As directed    Call MD for:  temperature >100.4   Complete by:  As directed    Diet - low sodium heart healthy   Complete by:  As directed    Increase activity slowly   Complete by:  As directed      Allergies as of 05/23/2017   No Known Allergies     Medication List    STOP taking these medications   amoxicillin-clavulanate 875-125 MG tablet Commonly known as:  AUGMENTIN   doxycycline 100 MG tablet Commonly known as:  VIBRA-TABS     TAKE these medications   acetaminophen 325 MG tablet Commonly known as:  TYLENOL Take 650 mg by mouth every 6 (six) hours as needed for mild pain.   apixaban 2.5 MG Tabs tablet Commonly known as:  ELIQUIS Take 1 tablet (2.5 mg total) by mouth 2 (two) times daily.   atorvastatin 10 MG tablet Commonly known as:  LIPITOR Take 10 mg by mouth every evening.   CALCIUM MAGNESIUM PO Take 1 tablet by mouth daily.   cholecalciferol  400 units Tabs tablet Commonly known as:  VITAMIN D Take 400 Units by mouth 2 (two) times daily.   collagenase ointment Commonly known as:  SANTYL Apply topically daily. Apply Santyl to right heel wound Q day, then cover with moist gauze 2X2 and foam dressing.  (Change foam dressing Q 3 days or PRN soiling.) What changed:    how much to take  additional instructions   iron polysaccharides 150 MG capsule Commonly known as:  NIFEREX Take 150 mg  by mouth 2 (two) times daily.   lactose free nutrition Liqd Take 237 mLs by mouth 2 (two) times daily between meals.   midodrine 5 MG tablet Commonly known as:  PROAMATINE Take 5 mg by mouth 2 (two) times daily with a meal.   neomycin-bacitracin-polymyxin Oint Commonly known as:  NEOSPORIN Apply 1 application topically daily. Apply Neosporin to edges of left leg wound Q day, then cover with nonadherent dressing and kerlex   ondansetron 4 MG tablet Commonly known as:  ZOFRAN Take 4 mg by mouth every 8 (eight) hours as needed for nausea or vomiting.   pantoprazole 40 MG tablet Commonly known as:  PROTONIX Take 40 mg by mouth daily.   PRESERVISION AREDS 2 PO Take 1 tablet by mouth daily.   sertraline 100 MG tablet Commonly known as:  ZOLOFT Take 100 mg by mouth every evening.      Follow-up Information    Caryl Bis, MD. Schedule an appointment as soon as possible for a visit in 1 week(s).   Specialty:  Family Medicine Contact information: [place] [address] [phone]          No Known Allergies  Consultations: None  Procedures/Studies: None  Subjective: Seen and examined at bedside.  Denies headache, dizziness, nausea vomiting chest pain shortness of breath.  Discharge Exam: Vitals:   05/22/17 2100 05/23/17 0500  BP: 127/85 125/63  Pulse: 99 82  Resp: 19 18  Temp: 98 F (36.7 C) 98.3 F (36.8 C)  SpO2: 97% 98%   Vitals:   05/22/17 0500 05/22/17 1308 05/22/17 2100 05/23/17 0500  BP: 140/60 117/89 127/85 125/63  Pulse: (!) 54 98 99 82  Resp: 18 20 19 18   Temp: 98.2 F (36.8 C) 98.2 F (36.8 C) 98 F (36.7 C) 98.3 F (36.8 C)  TempSrc: Axillary Oral Axillary Axillary  SpO2: 98%  97% 98%  Weight:      Height:        General: Pt is alert, awake, not in acute distress Cardiovascular: RRR, S1/S2 +, no rubs, no gallops Respiratory: CTA bilaterally, no wheezing, no rhonchi Abdominal: Soft, NT, ND, bowel sounds + Extremities:  no edema, no cyanosis    The results of significant diagnostics from this hospitalization (including imaging, microbiology, ancillary and laboratory) are listed below for reference.     Microbiology: Recent Results (from the past 240 hour(s))  C difficile quick scan w PCR reflex     Status: None   Collection Time: 05/21/17  9:00 AM  Result Value Ref Range Status   C Diff antigen NEGATIVE NEGATIVE Final   C Diff toxin NEGATIVE NEGATIVE Final   C Diff interpretation No C. difficile detected.  Final    Comment: Performed at [facility], [place] [address] [place], [place] [address]  Gastrointestinal Panel by PCR , Stool     Status: Abnormal   Collection Time: 05/21/17  9:00 AM  Result Value Ref Range Status  Campylobacter species NOT DETECTED NOT DETECTED Final   Plesimonas shigelloides NOT DETECTED NOT DETECTED Final   Salmonella species NOT DETECTED NOT DETECTED Final   Yersinia enterocolitica NOT DETECTED NOT DETECTED Final   Vibrio species NOT DETECTED NOT DETECTED Final   Vibrio cholerae NOT DETECTED NOT DETECTED Final   Enteroaggregative E coli (EAEC) NOT DETECTED NOT DETECTED Final   Enteropathogenic E coli (EPEC) NOT DETECTED NOT DETECTED Final   Enterotoxigenic E coli (ETEC) NOT DETECTED NOT DETECTED Final   Shiga like toxin producing E coli (STEC) NOT DETECTED NOT DETECTED Final   Shigella/Enteroinvasive E coli (EIEC) NOT DETECTED NOT DETECTED Final   Cryptosporidium NOT DETECTED NOT DETECTED Final   Cyclospora cayetanensis NOT DETECTED NOT DETECTED Final   Entamoeba histolytica NOT DETECTED NOT DETECTED Final   Giardia lamblia NOT DETECTED NOT DETECTED Final   Adenovirus F40/41 NOT DETECTED NOT DETECTED Final   Astrovirus NOT DETECTED NOT DETECTED Final   Norovirus GI/GII NOT DETECTED NOT DETECTED Final   Rotavirus A NOT DETECTED NOT DETECTED Final   Sapovirus (I, II, IV, and V) DETECTED (A) NOT DETECTED Final    Comment: Performed at [facility], [place] [place], [place] [address]     Labs: BNP (last 3 results) No results for input(s): BNP in the last 8760 hours. Basic Metabolic Panel: Recent Labs  Lab 05/20/17 2246 05/21/17 0349 05/21/17 0415 05/22/17 1039 05/23/17 0503  NA 142  --  143 144 145  K 2.4*  --  2.9* 3.7 3.7  CL 103  --  105 113* 112*  CO2 25  --  27 22 25   GLUCOSE 216*  --  149* 147* 117*  BUN 24*  --  25* 24* 20  CREATININE 1.99*  --  1.91* 1.47* 1.25*  CALCIUM 8.5*  --  7.9* 8.2* 8.1*  MG  --  1.7  --  1.9  --    Liver Function Tests: Recent Labs  Lab 05/20/17 2246 05/21/17 0415  AST 21 22  ALT 15* 12*  ALKPHOS 129* 111  BILITOT 0.8 1.0  PROT 6.2* 5.4*  ALBUMIN 2.9* 2.6*   Recent Labs  Lab 05/20/17 2246 05/21/17 0415 05/22/17 1039  LIPASE 673* 306* 114*   No results for input(s): AMMONIA in the last 168 hours. CBC: Recent Labs  Lab 05/20/17 2246 05/21/17 0415 05/22/17 1039 05/23/17 0503  WBC 19.6* 15.1* 12.5* 10.5  HGB 12.5* 11.0* 10.8* 9.8*  HCT 38.7* 33.9* 34.2* 31.4*  MCV 86.2 85.6 85.5 85.8  PLT 391 296 251 258   Cardiac Enzymes: No results for input(s): CKTOTAL, CKMB, CKMBINDEX, TROPONINI in the last 168 hours. BNP: Invalid input(s): POCBNP CBG: No results for input(s): GLUCAP in the last 168 hours. D-Dimer No results for input(s): DDIMER in the last 72 hours. Hgb A1c No results for input(s): HGBA1C in the last 72 hours. Lipid Profile Recent Labs    05/22/17 1039  CHOL 97  HDL 36*  LDLCALC 49  TRIG 60  CHOLHDL 2.7   Thyroid function studies Recent Labs    05/21/17 0415  TSH 0.723   Anemia work up No results for input(s): VITAMINB12, FOLATE, FERRITIN, TIBC, IRON, RETICCTPCT in the last 72 hours. Urinalysis    Component Value Date/Time   COLORURINE YELLOW 04/23/2017 1220   APPEARANCEUR HAZY (A) 04/23/2017 1220   LABSPEC 1.015 04/23/2017 1220   PHURINE 5.0 04/23/2017 1220   GLUCOSEU 50 (A) 04/23/2017 1220   HGBUR MODERATE (A) 04/23/2017 1220  BILIRUBINUR NEGATIVE 04/23/2017 1220   KETONESUR NEGATIVE 04/23/2017 1220   PROTEINUR NEGATIVE 04/23/2017 1220   UROBILINOGEN 0.2 02/08/2011 1428   NITRITE NEGATIVE 04/23/2017 1220   LEUKOCYTESUR LARGE (A) 04/23/2017 1220   Sepsis Labs Invalid input(s): PROCALCITONIN,  WBC,  LACTICIDVEN Microbiology Recent Results (from the past 240 hour(s))  C difficile quick scan w PCR reflex     Status: None   Collection Time: 05/21/17  9:00 AM  Result Value Ref Range Status   C Diff antigen NEGATIVE NEGATIVE Final   C Diff toxin NEGATIVE NEGATIVE Final   C Diff interpretation No C. difficile detected.  Final    Comment: Performed at [facility], [place] [address], [place], [place] [address]  Gastrointestinal Panel by PCR , Stool     Status: Abnormal   Collection Time: 05/21/17  9:00 AM  Result Value Ref Range Status   Campylobacter species NOT DETECTED NOT DETECTED Final   Plesimonas shigelloides NOT DETECTED NOT DETECTED Final   Salmonella species NOT DETECTED NOT DETECTED Final   Yersinia enterocolitica NOT DETECTED NOT DETECTED Final   Vibrio species NOT DETECTED NOT DETECTED Final   Vibrio cholerae NOT DETECTED NOT DETECTED Final   Enteroaggregative E coli (EAEC) NOT DETECTED NOT DETECTED Final   Enteropathogenic E coli (EPEC) NOT DETECTED NOT DETECTED Final   Enterotoxigenic E coli (ETEC) NOT DETECTED NOT DETECTED Final   Shiga like toxin producing E coli (STEC) NOT DETECTED NOT DETECTED Final   Shigella/Enteroinvasive E coli (EIEC) NOT DETECTED NOT DETECTED Final   Cryptosporidium NOT DETECTED NOT DETECTED Final   Cyclospora cayetanensis NOT DETECTED NOT DETECTED Final   Entamoeba histolytica NOT DETECTED NOT DETECTED Final   Giardia lamblia NOT DETECTED NOT DETECTED Final   Adenovirus F40/41 NOT DETECTED NOT DETECTED Final   Astrovirus NOT DETECTED NOT DETECTED Final   Norovirus GI/GII NOT DETECTED NOT DETECTED Final   Rotavirus A NOT DETECTED NOT DETECTED Final   Sapovirus (I,  II, IV, and V) DETECTED (A) NOT DETECTED Final    Comment: Performed at [facility], [address] [place], [place] [address]     Time coordinating discharge: 28 minutes  SIGNED:   Rosita Fire, MD  [facility] 05/23/2017, 11:52 AM  If 7PM-7AM, please contact night-coverage [email or web address] Password [email or web address]

## 2017-05-23 NOTE — Care Management Note (Signed)
Case Management Note  Patient Details  Name: Don Hall. MRN: [id number] Date of Birth: [birth year]  Subjective/Objective:     Pancreatitis               Action/Plan: NCM told by [place] Rep. pt approved for [facility]/Home 1st Program. Pt will transition to home today with home health services/[facility]Home [facility]st Program.  Son to provide transportation to home.  Expected Discharge Date:  05/23/17               Expected Discharge Plan:  [place]  In-House Referral:     Discharge planning Services  CM Consult  Post Acute Care Choice:   Choice offered to:  Patient  HH Arranged:  RN, PT, OT, Nurse's Aide [place] Agency:  [place])  Status of Service:  Completed, signed off  If discussed at [place] of Stay Meetings, dates discussed:    Additional Comments:  Sharin Mons, RN 05/23/2017, 2:58 PM

## 2017-05-23 NOTE — Consult Note (Signed)
   [facility] CM Inpatient Consult   05/23/2017  Don Hall 05/11/1927 [id number]    Made aware by Tommi Rumps with [facility] First program that [name] will enroll in the [place] program.  [place] Management will assist patient if pharmacy or transportation needs are identified while on the [place] program.   [place] Management consent was obtained yesterday. Since [name] will enroll in the [place] program, [place] Management will not follow for case management services at this time. [place] will re-refer to [place] Management at later time if community case management services are needed.   Will send notification to [place] Management office of [facility] First enrollment.    [name], MSN-Ed, RN,BSN [facility] Liaison [phone]

## 2017-05-24 DIAGNOSIS — I83022 Varicose veins of left lower extremity with ulcer of calf: Secondary | ICD-10-CM | POA: Diagnosis not present

## 2017-05-24 DIAGNOSIS — K8500 Idiopathic acute pancreatitis without necrosis or infection: Secondary | ICD-10-CM | POA: Diagnosis not present

## 2017-05-25 DIAGNOSIS — K8500 Idiopathic acute pancreatitis without necrosis or infection: Secondary | ICD-10-CM | POA: Diagnosis not present

## 2017-05-25 DIAGNOSIS — I83022 Varicose veins of left lower extremity with ulcer of calf: Secondary | ICD-10-CM | POA: Diagnosis not present

## 2017-05-26 DIAGNOSIS — I83022 Varicose veins of left lower extremity with ulcer of calf: Secondary | ICD-10-CM | POA: Diagnosis not present

## 2017-05-26 DIAGNOSIS — K8500 Idiopathic acute pancreatitis without necrosis or infection: Secondary | ICD-10-CM | POA: Diagnosis not present

## 2017-05-29 DIAGNOSIS — K8500 Idiopathic acute pancreatitis without necrosis or infection: Secondary | ICD-10-CM | POA: Diagnosis not present

## 2017-05-29 DIAGNOSIS — I83022 Varicose veins of left lower extremity with ulcer of calf: Secondary | ICD-10-CM | POA: Diagnosis not present

## 2017-05-31 DIAGNOSIS — K8500 Idiopathic acute pancreatitis without necrosis or infection: Secondary | ICD-10-CM | POA: Diagnosis not present

## 2017-05-31 DIAGNOSIS — I83022 Varicose veins of left lower extremity with ulcer of calf: Secondary | ICD-10-CM | POA: Diagnosis not present

## 2017-06-01 DIAGNOSIS — I83022 Varicose veins of left lower extremity with ulcer of calf: Secondary | ICD-10-CM | POA: Diagnosis not present

## 2017-06-01 DIAGNOSIS — K8500 Idiopathic acute pancreatitis without necrosis or infection: Secondary | ICD-10-CM | POA: Diagnosis not present

## 2017-06-02 ENCOUNTER — Emergency Department (HOSPITAL_COMMUNITY): Payer: Medicare Other

## 2017-06-02 ENCOUNTER — Other Ambulatory Visit: Payer: Self-pay

## 2017-06-02 ENCOUNTER — Observation Stay (HOSPITAL_COMMUNITY)
Admission: EM | Admit: 2017-06-02 | Discharge: 2017-06-04 | Disposition: A | Discharge status: Home / Self Care | Payer: Medicare Other | Attending: Internal Medicine | Admitting: Internal Medicine

## 2017-06-02 ENCOUNTER — Encounter (HOSPITAL_COMMUNITY): Payer: Self-pay | Admitting: Emergency Medicine

## 2017-06-02 DIAGNOSIS — I129 Hypertensive chronic kidney disease with stage 1 through stage 4 chronic kidney disease, or unspecified chronic kidney disease: Secondary | ICD-10-CM | POA: Insufficient documentation

## 2017-06-02 DIAGNOSIS — E876 Hypokalemia: Secondary | ICD-10-CM | POA: Insufficient documentation

## 2017-06-02 DIAGNOSIS — I10 Essential (primary) hypertension: Secondary | ICD-10-CM | POA: Diagnosis present

## 2017-06-02 DIAGNOSIS — I480 Paroxysmal atrial fibrillation: Secondary | ICD-10-CM | POA: Insufficient documentation

## 2017-06-02 DIAGNOSIS — R55 Syncope and collapse: Secondary | ICD-10-CM | POA: Diagnosis not present

## 2017-06-02 DIAGNOSIS — R531 Weakness: Secondary | ICD-10-CM | POA: Diagnosis not present

## 2017-06-02 DIAGNOSIS — N189 Chronic kidney disease, unspecified: Secondary | ICD-10-CM | POA: Diagnosis not present

## 2017-06-02 DIAGNOSIS — N183 Chronic kidney disease, stage 3 (moderate): Secondary | ICD-10-CM | POA: Diagnosis not present

## 2017-06-02 DIAGNOSIS — N179 Acute kidney failure, unspecified: Secondary | ICD-10-CM | POA: Diagnosis not present

## 2017-06-02 DIAGNOSIS — Z7901 Long term (current) use of anticoagulants: Secondary | ICD-10-CM

## 2017-06-02 DIAGNOSIS — E440 Moderate protein-calorie malnutrition: Secondary | ICD-10-CM | POA: Diagnosis present

## 2017-06-02 DIAGNOSIS — R9431 Abnormal electrocardiogram [ECG] [EKG]: Secondary | ICD-10-CM

## 2017-06-02 DIAGNOSIS — E860 Dehydration: Principal | ICD-10-CM | POA: Insufficient documentation

## 2017-06-02 DIAGNOSIS — Z85828 Personal history of other malignant neoplasm of skin: Secondary | ICD-10-CM | POA: Diagnosis not present

## 2017-06-02 DIAGNOSIS — R112 Nausea with vomiting, unspecified: Secondary | ICD-10-CM

## 2017-06-02 DIAGNOSIS — R197 Diarrhea, unspecified: Secondary | ICD-10-CM | POA: Diagnosis not present

## 2017-06-02 DIAGNOSIS — M6281 Muscle weakness (generalized): Secondary | ICD-10-CM | POA: Diagnosis not present

## 2017-06-02 DIAGNOSIS — E1122 Type 2 diabetes mellitus with diabetic chronic kidney disease: Secondary | ICD-10-CM | POA: Insufficient documentation

## 2017-06-02 DIAGNOSIS — I4581 Long QT syndrome: Secondary | ICD-10-CM | POA: Diagnosis not present

## 2017-06-02 DIAGNOSIS — N390 Urinary tract infection, site not specified: Secondary | ICD-10-CM | POA: Diagnosis not present

## 2017-06-02 DIAGNOSIS — I951 Orthostatic hypotension: Secondary | ICD-10-CM | POA: Diagnosis not present

## 2017-06-02 DIAGNOSIS — E441 Mild protein-calorie malnutrition: Secondary | ICD-10-CM | POA: Diagnosis not present

## 2017-06-02 HISTORY — DX: Acute kidney failure, unspecified: N17.9

## 2017-06-02 LAB — CBC WITH DIFFERENTIAL/PLATELET
BASOS PCT: 0 %
Basophils Absolute: 0 10*3/uL (ref 0.0–0.1)
Eosinophils Absolute: 0 10*3/uL (ref 0.0–0.7)
Eosinophils Relative: 0 %
HEMATOCRIT: 40.4 % (ref 39.0–52.0)
HEMOGLOBIN: 13.1 g/dL (ref 13.0–17.0)
LYMPHS PCT: 13 %
Lymphs Abs: 1.7 10*3/uL (ref 0.7–4.0)
MCH: 27.2 pg (ref 26.0–34.0)
MCHC: 32.4 g/dL (ref 30.0–36.0)
MCV: 84 fL (ref 78.0–100.0)
MONO ABS: 0.8 10*3/uL (ref 0.1–1.0)
MONOS PCT: 6 %
NEUTROS PCT: 81 %
Neutro Abs: 10.5 10*3/uL — ABNORMAL HIGH (ref 1.7–7.7)
Platelets: 299 10*3/uL (ref 150–400)
RBC: 4.81 MIL/uL (ref 4.22–5.81)
RDW: 16.4 % — AB (ref 11.5–15.5)
WBC: 13.1 10*3/uL — ABNORMAL HIGH (ref 4.0–10.5)

## 2017-06-02 LAB — URINALYSIS, ROUTINE W REFLEX MICROSCOPIC
BILIRUBIN URINE: NEGATIVE
Glucose, UA: 50 mg/dL — AB
KETONES UR: NEGATIVE mg/dL
Leukocytes, UA: NEGATIVE
Nitrite: NEGATIVE
PH: 6 (ref 5.0–8.0)
PROTEIN: 100 mg/dL — AB
Specific Gravity, Urine: 1.017 (ref 1.005–1.030)

## 2017-06-02 LAB — COMPREHENSIVE METABOLIC PANEL
ALBUMIN: 3.1 g/dL — AB (ref 3.5–5.0)
ALT: 14 U/L — AB (ref 17–63)
AST: 27 U/L (ref 15–41)
Alkaline Phosphatase: 129 U/L — ABNORMAL HIGH (ref 38–126)
Anion gap: 18 — ABNORMAL HIGH (ref 5–15)
BUN: 19 mg/dL (ref 6–20)
CO2: 24 mmol/L (ref 22–32)
CREATININE: 1.56 mg/dL — AB (ref 0.61–1.24)
Calcium: 9 mg/dL (ref 8.9–10.3)
Chloride: 98 mmol/L — ABNORMAL LOW (ref 101–111)
GFR calc Af Amer: 44 mL/min — ABNORMAL LOW (ref >60)
GFR calc non Af Amer: 38 mL/min — ABNORMAL LOW (ref >60)
GLUCOSE: 222 mg/dL — AB (ref 65–99)
POTASSIUM: 2.9 mmol/L — AB (ref 3.5–5.1)
SODIUM: 140 mmol/L (ref 135–145)
Total Bilirubin: 1.2 mg/dL (ref 0.3–1.2)
Total Protein: 7.3 g/dL (ref 6.5–8.1)

## 2017-06-02 LAB — LIPASE, BLOOD: LIPASE: 72 U/L — AB (ref 11–51)

## 2017-06-02 LAB — MAGNESIUM: Magnesium: 1.3 mg/dL — ABNORMAL LOW (ref 1.7–2.4)

## 2017-06-02 LAB — CBG MONITORING, ED: Glucose-Capillary: 189 mg/dL — ABNORMAL HIGH (ref 65–99)

## 2017-06-02 LAB — TROPONIN I: Troponin I: <0.03 ng/mL (ref <0.03)

## 2017-06-02 MED ORDER — LACTATED RINGERS IV SOLN
INTRAVENOUS | Status: DC
  Administered 2017-06-02: 23:00:00 via INTRAVENOUS

## 2017-06-02 MED ORDER — ACETAMINOPHEN 650 MG RE SUPP: 650.0000 mg | Freq: Four times a day (QID) | RECTAL | Status: DC | PRN

## 2017-06-02 MED ORDER — OCUVITE-LUTEIN PO CAPS
1.0000 | ORAL_CAPSULE | Freq: Every day | ORAL | Status: DC
  Administered 2017-06-03 – 2017-06-04 (×2): 1 via ORAL
  Filled 2017-06-02 (×6): qty 1

## 2017-06-02 MED ORDER — ONDANSETRON HCL 4 MG/2ML IJ SOLN
4.0000 mg | Freq: Four times a day (QID) | INTRAMUSCULAR | Status: DC | PRN
  Administered 2017-06-02: 4 mg via INTRAVENOUS
  Filled 2017-06-02: qty 2

## 2017-06-02 MED ORDER — SODIUM CHLORIDE 0.9 % IV SOLN
INTRAVENOUS | Status: DC
  Administered 2017-06-02: 17:00:00 via INTRAVENOUS

## 2017-06-02 MED ORDER — SERTRALINE HCL 50 MG PO TABS
100.0000 mg | ORAL_TABLET | Freq: Every evening | ORAL | Status: DC
  Administered 2017-06-03: 100 mg via ORAL
  Filled 2017-06-02 (×2): qty 2

## 2017-06-02 MED ORDER — SODIUM CHLORIDE 0.9 % IV SOLN
1.0000 g | Freq: Once | INTRAVENOUS | Status: AC
  Administered 2017-06-02: 1 g via INTRAVENOUS
  Filled 2017-06-02: qty 10

## 2017-06-02 MED ORDER — SODIUM CHLORIDE 0.9% FLUSH
3.0000 mL | Freq: Two times a day (BID) | INTRAVENOUS | Status: DC
  Administered 2017-06-03 – 2017-06-04 (×3): 3 mL via INTRAVENOUS

## 2017-06-02 MED ORDER — SODIUM CHLORIDE 0.9 % IV BOLUS
500.0000 mL | Freq: Once | INTRAVENOUS | Status: AC
  Administered 2017-06-02: 500 mL via INTRAVENOUS

## 2017-06-02 MED ORDER — ATORVASTATIN CALCIUM 10 MG PO TABS
10.0000 mg | ORAL_TABLET | Freq: Every evening | ORAL | Status: DC
  Administered 2017-06-03: 10 mg via ORAL
  Filled 2017-06-02 (×2): qty 1

## 2017-06-02 MED ORDER — ONDANSETRON HCL 4 MG PO TABS
4.0000 mg | ORAL_TABLET | Freq: Four times a day (QID) | ORAL | Status: DC | PRN
  Filled 2017-06-02: qty 1

## 2017-06-02 MED ORDER — ACETAMINOPHEN 325 MG PO TABS: 650.0000 mg | ORAL_TABLET | Freq: Four times a day (QID) | ORAL | Status: DC | PRN

## 2017-06-02 MED ORDER — ENSURE ENLIVE PO LIQD
237.0000 mL | Freq: Two times a day (BID) | ORAL | Status: DC
  Administered 2017-06-03 – 2017-06-04 (×3): 237 mL via ORAL
  Filled 2017-06-02 (×6): qty 237

## 2017-06-02 MED ORDER — APIXABAN 2.5 MG PO TABS
2.5000 mg | ORAL_TABLET | Freq: Two times a day (BID) | ORAL | Status: DC
  Administered 2017-06-03 – 2017-06-04 (×3): 2.5 mg via ORAL
  Filled 2017-06-02 (×4): qty 1

## 2017-06-02 MED ORDER — PANTOPRAZOLE SODIUM 40 MG PO TBEC
40.0000 mg | DELAYED_RELEASE_TABLET | Freq: Every day | ORAL | Status: DC
  Administered 2017-06-03 – 2017-06-04 (×2): 40 mg via ORAL
  Filled 2017-06-02 (×2): qty 1

## 2017-06-02 MED ORDER — MORPHINE SULFATE (PF) 2 MG/ML IV SOLN: 2.0000 mg | INTRAVENOUS | Status: DC | PRN

## 2017-06-02 MED ORDER — MAGNESIUM SULFATE 2 GM/50ML IV SOLN
2.0000 g | Freq: Once | INTRAVENOUS | Status: AC
  Administered 2017-06-02: 2 g via INTRAVENOUS
  Filled 2017-06-02: qty 50

## 2017-06-02 MED ORDER — MIDODRINE HCL 5 MG PO TABS
5.0000 mg | ORAL_TABLET | Freq: Two times a day (BID) | ORAL | Status: DC
  Administered 2017-06-03 – 2017-06-04 (×3): 5 mg via ORAL
  Filled 2017-06-02 (×3): qty 1

## 2017-06-02 MED ORDER — POTASSIUM CHLORIDE CRYS ER 20 MEQ PO TBCR
40.0000 meq | EXTENDED_RELEASE_TABLET | Freq: Once | ORAL | Status: DC
  Filled 2017-06-02: qty 2

## 2017-06-02 MED ORDER — POTASSIUM CHLORIDE 10 MEQ/100ML IV SOLN
10.0000 meq | INTRAVENOUS | Status: AC
  Administered 2017-06-02 (×2): 10 meq via INTRAVENOUS
  Filled 2017-06-02 (×2): qty 100

## 2017-06-02 MED ORDER — SODIUM CHLORIDE 0.9% FLUSH: 10.0000 mL | INTRAVENOUS | Status: DC | PRN

## 2017-06-02 NOTE — ED Notes (Signed)
Lab only able to draw enough blood for CBC and Cmet.  DR. Thurnell Garbe notified.
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
## 2017-06-02 NOTE — ED Notes (Signed)
IV Team at bedside 

## 2017-06-02 NOTE — ED Notes (Signed)
Pts sats decreased 82 %. Placed on 4 L/m via [place]

## 2017-06-02 NOTE — ED Triage Notes (Signed)
Pt sent by PCP for hypotension and acute pancreatitis. Pt's son states pt has been vomiting and having abdominal pain for weeks. When getting patient from car into wheelchair, pt had a syncopal episode in the wheelchair. that lasted about 1 min. Pt denies any pain at this time.

## 2017-06-02 NOTE — ED Notes (Signed)
Lab having difficulty obtaining blood unable to get lactic acid. EDP aware

## 2017-06-02 NOTE — ED Notes (Signed)
Pt transported to CT ?
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
## 2017-06-02 NOTE — ED Notes (Signed)
Pt alert by the time he was brought back to room. Answering appropriately. Son reports several days of not eating well. As well as diarrhea. Pt had large incontinent loose. Nursing staff provided incontinence care

## 2017-06-02 NOTE — ED Notes (Signed)
Pt sats 82% on room air.  Placed on 4 liters [place] oxygen

## 2017-06-03 DIAGNOSIS — N179 Acute kidney failure, unspecified: Secondary | ICD-10-CM | POA: Diagnosis not present

## 2017-06-03 DIAGNOSIS — E860 Dehydration: Secondary | ICD-10-CM | POA: Diagnosis not present

## 2017-06-03 DIAGNOSIS — N189 Chronic kidney disease, unspecified: Secondary | ICD-10-CM | POA: Diagnosis not present

## 2017-06-03 DIAGNOSIS — R55 Syncope and collapse: Secondary | ICD-10-CM | POA: Diagnosis not present

## 2017-06-03 LAB — GASTROINTESTINAL PANEL BY PCR, STOOL (REPLACES STOOL CULTURE)
ASTROVIRUS: NOT DETECTED
Adenovirus F40/41: NOT DETECTED
CAMPYLOBACTER SPECIES: NOT DETECTED
CYCLOSPORA CAYETANENSIS: NOT DETECTED
Cryptosporidium: NOT DETECTED
ENTEROAGGREGATIVE E COLI (EAEC): NOT DETECTED
ENTEROTOXIGENIC E COLI (ETEC): NOT DETECTED
Entamoeba histolytica: NOT DETECTED
Enteropathogenic E coli (EPEC): NOT DETECTED
GIARDIA LAMBLIA: NOT DETECTED
Norovirus GI/GII: NOT DETECTED
PLESIMONAS SHIGELLOIDES: NOT DETECTED
ROTAVIRUS A: NOT DETECTED
Salmonella species: NOT DETECTED
Sapovirus (I, II, IV, and V): NOT DETECTED
Shiga like toxin producing E coli (STEC): NOT DETECTED
Shigella/Enteroinvasive E coli (EIEC): NOT DETECTED
VIBRIO SPECIES: NOT DETECTED
Vibrio cholerae: NOT DETECTED
YERSINIA ENTEROCOLITICA: NOT DETECTED

## 2017-06-03 LAB — CBC
HCT: 31 % — ABNORMAL LOW (ref 39.0–52.0)
Hemoglobin: 10 g/dL — ABNORMAL LOW (ref 13.0–17.0)
MCH: 27 pg (ref 26.0–34.0)
MCHC: 32.3 g/dL (ref 30.0–36.0)
MCV: 83.8 fL (ref 78.0–100.0)
PLATELETS: 295 10*3/uL (ref 150–400)
RBC: 3.7 MIL/uL — ABNORMAL LOW (ref 4.22–5.81)
RDW: 16.4 % — AB (ref 11.5–15.5)
WBC: 10.1 10*3/uL (ref 4.0–10.5)

## 2017-06-03 LAB — BASIC METABOLIC PANEL
Anion gap: 13 (ref 5–15)
BUN: 21 mg/dL — AB (ref 6–20)
CALCIUM: 8.4 mg/dL — AB (ref 8.9–10.3)
CHLORIDE: 102 mmol/L (ref 101–111)
CO2: 27 mmol/L (ref 22–32)
Creatinine, Ser: 1.65 mg/dL — ABNORMAL HIGH (ref 0.61–1.24)
GFR calc non Af Amer: 35 mL/min — ABNORMAL LOW (ref >60)
GFR, EST AFRICAN AMERICAN: 41 mL/min — AB (ref >60)
Glucose, Bld: 147 mg/dL — ABNORMAL HIGH (ref 65–99)
Potassium: 3 mmol/L — ABNORMAL LOW (ref 3.5–5.1)
SODIUM: 142 mmol/L (ref 135–145)

## 2017-06-03 LAB — C DIFFICILE QUICK SCREEN W PCR REFLEX
C DIFFICILE (CDIFF) INTERP: NOT DETECTED
C Diff antigen: NEGATIVE
C Diff toxin: NEGATIVE

## 2017-06-03 LAB — MAGNESIUM: Magnesium: 1.8 mg/dL (ref 1.7–2.4)

## 2017-06-03 LAB — TSH: TSH: 1.311 u[IU]/mL (ref 0.350–4.500)

## 2017-06-03 MED ORDER — SODIUM CHLORIDE 0.9 % IV SOLN
INTRAVENOUS | Status: DC
  Administered 2017-06-03 – 2017-06-04 (×2): via INTRAVENOUS

## 2017-06-03 MED ORDER — POTASSIUM CHLORIDE CRYS ER 20 MEQ PO TBCR
40.0000 meq | EXTENDED_RELEASE_TABLET | ORAL | Status: AC
  Administered 2017-06-03 (×2): 40 meq via ORAL
  Filled 2017-06-03 (×2): qty 2

## 2017-06-03 MED ORDER — FLUDROCORTISONE ACETATE 0.1 MG PO TABS
0.1000 mg | ORAL_TABLET | Freq: Every day | ORAL | Status: DC
  Administered 2017-06-03 – 2017-06-04 (×2): 0.1 mg via ORAL
  Filled 2017-06-03 (×3): qty 1

## 2017-06-03 MED ORDER — MAGNESIUM OXIDE 400 (241.3 MG) MG PO TABS
400.0000 mg | ORAL_TABLET | Freq: Three times a day (TID) | ORAL | Status: DC
  Administered 2017-06-03 – 2017-06-04 (×3): 400 mg via ORAL
  Filled 2017-06-03 (×3): qty 1

## 2017-06-03 NOTE — Plan of Care (Signed)
  Problem: Acute Rehab PT Goals(only PT should resolve) Goal: Pt Will Go Supine/Side To Sit Flowsheets (Taken 06/03/2017 1220) Pt will go Supine/Side to Sit: with supervision Goal: Pt Will Go Sit To Supine/Side Flowsheets (Taken 06/03/2017 1220) Pt will go Sit to Supine/Side: with supervision Goal: Patient Will Transfer Sit To/From Stand Flowsheets (Taken 06/03/2017 1220) Patient will transfer sit to/from stand: with minimal assist Goal: Pt Will Transfer Bed To Chair/Chair To Bed Flowsheets (Taken 06/03/2017 1220) Pt will Transfer Bed to Chair/Chair to Bed: with min assist Goal: Pt Will Ambulate Flowsheets (Taken 06/03/2017 1220) Pt will Ambulate: 15 feet;with moderate assist;with rolling walker;with other equipment (comment) (w/c follow)   Geraldine Solar PT, DPT

## 2017-06-03 NOTE — Progress Notes (Signed)
Patient hasn't voided per documentation since in ED. Bladder scan showed 136ml of retained urine. Dr. Darrick Meigs notified. Foley catheter order but patient is refusing with Rosary Lively RN as witness. Dr.Lama notified of patient refusal.

## 2017-06-03 NOTE — Clinical Social Work Note (Signed)
Clinical Social Work Assessment  Patient Details  Name: Don Hall. MRN: [id number] Date of Birth: 1927/03/01  Date of referral:  06/03/17               Reason for consult:  Facility Placement                Permission sought to share information with:  Facility Sport and exercise psychologist, Family Supports Permission granted to share information::  Yes, Verbal Permission Granted  Name::     Don Hall  Agency::  yes  Relationship::  Son and Sales executive Information:  yes  Housing/Transportation Living arrangements for the past 2 months:  Single Family Home Source of Information:  Patient, Adult Children Patient Interpreter Needed:  None Criminal Activity/Legal Involvement Pertinent to Current Situation/Hospitalization:  No - Comment as needed Significant Relationships:  Adult Children, Spouse Lives with:  Spouse Do you feel safe going back to the place where you live?  Yes Need for family participation in patient care:  Yes (Comment)  Care giving concerns:  CSW received consult regarding SNF placement.  CSW spoke with patient and patient's son.  Patient resides at home.  Patient's son would like for pt to [facility] at [facility].  Patient's son will consult with the family's attorney concerning long term care if needed. CSW will continue to follow and assist with discharge planning needs.   Social Worker assessment / plan:  CSW spoke with patient and patient's son regarding SNF placement.  Patient and patient's son are agreeable for placement.  Employment status:  Retired Nurse, adult PT Recommendations:  [place] / Referral to community resources:  (NA)  Patient/Family's Response to care:  Patient and patient's daughter reports agreement with discharging plan to SNF placement.  Patient/Family's Understanding of and Emotional Response to Diagnosis, Current Treatment, and Prognosis:  Patient and family are realistic regarding  therapy needs and expressed being hopeful for SNF placement.  Patient and patient's son expressed understanding of CSW role and discharge process as well as medical condition.  No questions/concerns about plan or treatment.  Emotional Assessment Appearance:  Appears stated age Attitude/Demeanor/Rapport:  Gracious, Engaged Affect (typically observed):  Accepting, Appropriate Orientation:  Oriented to Self, Oriented to Place, Oriented to  Time, Oriented to Situation Alcohol / Substance use:  Not Applicable Psych involvement (Current and /or in the community):  No (Comment)  Discharge Needs  Concerns to be addressed:  No discharge needs identified Readmission within the last 30 days:  Yes Current discharge risk:  Other(limited with mobility) Barriers to Discharge:  Continued Medical Work up   [employer], [place] 06/03/2017, 2:55 PM

## 2017-06-03 NOTE — NC FL2 (Signed)
[place] LEVEL OF CARE SCREENING TOOL     IDENTIFICATION  Patient Name: Don Hall. Birthdate: 31-May-1927 Sex: male Admission Date (Current Location): 06/02/2017  [facility] and [place] Number:  [employer] and Address:  [place] [address] [place]      Provider Number: [id number]  Attending Physician Name and Address:  Isaac Bliss, [place]  Relative Name and Phone Number:  Jarett Dralle [phone]    Current Level of Care: Hospital Recommended Level of Care: [place] Prior Approval Number:    Date Approved/Denied:   PASRR Number: [id number]  Discharge Plan: SNF    Current Diagnoses: Patient Active Problem List   Diagnosis Date Noted  . Syncope 06/02/2017  . Pancreatitis, acute 05/21/2017  . Hypokalemia 05/21/2017  . Nausea & vomiting 05/21/2017  . Acute kidney injury superimposed on CKD ([place]) 05/21/2017  . Lactic acidosis 05/21/2017  . Malnutrition of moderate degree 04/26/2017  . Anemia of chronic disease 04/24/2017  . DVT (deep venous thrombosis) ([place]) 04/24/2017  . Chronic kidney disease, stage 3 ([place]) 04/23/2017  . Sepsis ([place]) 04/23/2017  . Myoclonus 04/23/2017  . Generalized weakness 04/23/2017  . Pre-syncope 04/23/2017  . Heel ulcer ([place]) 04/23/2017  . Orthostatic hypotension 04/23/2017  . Varicose veins of left lower extremity with ulcer of calf ([place]) 01/27/2015  . Atherosclerosis of artery of extremity with ulceration ([place]) 10/24/2014  . Diarrhea 06/16/2014  . Shortness of breath 10/09/2013  . GERD (gastroesophageal reflux disease) 04/25/2011  . Long term (current) use of anticoagulants 06/07/2010  . DEEP VENOUS THROMBOPHLEBITIS, RECURRENT 03/08/2010  . Hyperlipemia 12/15/2008  . Essential hypertension, benign 12/15/2008  . Paroxysmal atrial fibrillation ([place]) 12/15/2008    Orientation RESPIRATION BLADDER Height & Weight     Self, Time, Situation, Place  O2  Incontinent Weight: 165 lb (74.8 kg) Height:  5\' 10"  (177.8 cm)  BEHAVIORAL SYMPTOMS/MOOD NEUROLOGICAL BOWEL NUTRITION STATUS  (NA) (NA) Incontinent (NA)  AMBULATORY STATUS COMMUNICATION OF NEEDS Skin   Extensive Assist Verbally Other (Comment)(several wounds located on legs and feet)                       Personal Care Assistance Level of Assistance  Bathing, Dressing Bathing Assistance: Maximum assistance   Dressing Assistance: Maximum assistance     Functional Limitations Info  (NA)          SPECIAL CARE FACTORS FREQUENCY  PT (By licensed PT)     PT Frequency: 3x week              Contractures Contractures Info: Not present    Additional Factors Info  Code Status Code Status Info: DNR             Current Medications (06/03/2017):  This is the current hospital active medication list Current Facility-Administered Medications  Medication Dose Route Frequency Provider Last Rate Last Dose  . 0.9 %  sodium chloride infusion   Intravenous Continuous Isaac Bliss, Rayford Halsted, MD 75 mL/hr at 06/03/17 1333    . acetaminophen (TYLENOL) tablet 650 mg  650 mg Oral Q6H PRN Karmen Bongo, MD       Or  . acetaminophen (TYLENOL) suppository 650 mg  650 mg Rectal Q6H PRN Karmen Bongo, MD      . apixaban Arne [facility]) tablet 2.5 mg  2.5 mg Oral BID Karmen Bongo, MD   2.5 mg at 06/03/17 0850  . atorvastatin (LIPITOR) tablet 10 mg  10 mg Oral QPM Karmen Bongo, MD      . feeding supplement (ENSURE ENLIVE) (ENSURE ENLIVE) liquid 237 mL  237 mL Oral BID BM Karmen Bongo, MD   237 mL at 06/03/17 0850  . fludrocortisone (FLORINEF) tablet 0.1 mg  0.1 mg Oral Daily Isaac Bliss, Rayford Halsted, MD   0.1 mg at 06/03/17 1333  . magnesium oxide (MAG-OX) tablet 400 mg  400 mg Oral TID Isaac Bliss, Rayford Halsted, MD      . midodrine (PROAMATINE) tablet 5 mg  5 mg Oral BID WC Karmen Bongo, MD   5 mg at 06/03/17 0850  . morphine 2 MG/ML injection 2 mg  2 mg Intravenous Q2H PRN  Karmen Bongo, MD      . multivitamin-lutein [facility]) capsule 1 capsule  1 capsule Oral Daily Karmen Bongo, MD   1 capsule at 06/03/17 0849  . ondansetron (ZOFRAN) tablet 4 mg  4 mg Oral Q6H PRN Karmen Bongo, MD       Or  . ondansetron [facility]) injection 4 mg  4 mg Intravenous Q6H PRN Karmen Bongo, MD   4 mg at 06/02/17 2311  . pantoprazole (PROTONIX) EC tablet 40 mg  40 mg Oral Daily Karmen Bongo, MD   40 mg at 06/03/17 0849  . potassium chloride SA (K-DUR,KLOR-CON) CR tablet 40 mEq  40 mEq Oral Once Karmen Bongo, MD      . potassium chloride SA (K-DUR,KLOR-CON) CR tablet 40 mEq  40 mEq Oral Q4H Isaac Bliss, Rayford Halsted, MD   40 mEq at 06/03/17 1333  . sertraline (ZOLOFT) tablet 100 mg  100 mg Oral QPM Karmen Bongo, MD      . sodium chloride flush (NS) 0.9 % injection 10-40 mL  10-40 mL Intracatheter PRN Karmen Bongo, MD      . sodium chloride flush (NS) 0.9 % injection 3 mL  3 mL Intravenous Q12H Karmen Bongo, MD   3 mL at 06/03/17 1103     Discharge Medications: Please see discharge summary for a list of discharge medications.  Relevant Imaging Results:  Relevant Lab Results:   Additional Information SS# [id number]  Carolin Sicks, [place]

## 2017-06-03 NOTE — Progress Notes (Signed)
PROGRESS NOTE    Don Hall.  WPY:[id number] DOB: May 19, 1927 DOA: 06/02/2017 PCP: Caryl Bis, MD     Brief Narrative:  84 year old man admitted from home on 4/5 after syncopal event.  He has a history of syncopal events dating since November.  They always happen when getting him out of the car.  He has also been having vomiting and diarrhea for a few weeks.  Admission was requested.   Assessment & Plan:   Principal Problem:   Syncope Active Problems:   Essential hypertension, benign   Paroxysmal atrial fibrillation (HCC)   Long term (current) use of anticoagulants   Generalized weakness   Orthostatic hypotension   Malnutrition of moderate degree   Acute kidney injury superimposed on CKD (HCC)   Syncopal event -With orthostatic hypotension on admission. -Also believed to have a component of dehydration and intravascular volume depletion given recent GI illness. -Patient is nonambulatory at baseline but was orthostatic basically from  lying to sitting. -He is not on any antihypertensive agents. -Continue IV fluids, switch to normal saline. -Is on midodrine which I will continue. -Have discussed with daughter compression stockings, which she refuses to wear, have also discussed Florinef which I will decide to initiate at low dose of 0.1 mg daily given his recurrent episodes of orthostatic hypotension. -Patient's family is interested in pursuing SNF, however I am not sure that this will be possible given his observation status.  Acute on chronic kidney disease stage III -Baseline creatinine appears to be around 1.4. -Creatinine on admission was 1.56. -Suspect this is more likely to be a new baseline for him as his creatinine has fluctuated between 1.2 and 1.9 for the past 2-3 months.  Paroxysmal atrial fibrillation  -Rate controlled, continue anticoagulation with Eliquis.  Hypokalemia/hypomagnesemia -Likely secondary to recent emesis, continue to replace  orally. -Magnesium was low at 1.3 on admission,-is up to 1.8 today after repletion.  Will start daily supplementation with 400 mg 3 times daily of magnesium oxide.  Nausea, vomiting, diarrhea -Nausea and vomiting have resolved since admission. -Suspect acute viral gastroenteritis.   DVT prophylaxis: Eliquis Code Status: Full code Family Communication: Daughter Diane via phone  Disposition Plan: anticipate discharge home with home health therapies in 24 hours   Consultants:   None  Procedures:   None  Antimicrobials:  Anti-infectives (From admission, onward)   Start     Dose/Rate Route Frequency Ordered Stop   06/02/17 1530  cefTRIAXone (ROCEPHIN) 1 g in sodium chloride 0.9 % 100 mL IVPB     1 g 200 mL/hr over 30 Minutes Intravenous  Once 06/02/17 1523 06/02/17 1643       Subjective: Feels well, no complaints, states he has not had any emesis or nausea since admission, one episode of diarrhea earlier today.  Denies dizziness or shortness of breath or lightheadedness.  Objective: Vitals:   06/02/17 1930 06/02/17 2035 06/03/17 0511 06/03/17 0514  BP: (!) 135/91 123/86 (!) 142/70 (!) 158/69  Pulse: 75 (!) 108 72 62  Resp: (!) 22     Temp:  98.3 F (36.8 C)  97.7 F (36.5 C)  TempSrc:  Oral  Oral  SpO2: 100% 100% 100% 100%  Weight:      Height:        Intake/Output Summary (Last 24 hours) at 06/03/2017 1202 Last data filed at 06/03/2017 0600 Gross per 24 hour  Intake 914 ml  Output -  Net 914 ml   Filed Weights   06/02/17  1339  Weight: 74.8 kg (165 lb)    Examination:  General exam: Alert, awake, oriented x 3, dry mucous membranes with cracked lips and tongue. Respiratory system: Clear to auscultation. Respiratory effort normal. Cardiovascular system:RRR. No murmurs, rubs, gallops. Gastrointestinal system: Abdomen is nondistended, soft and nontender. No organomegaly or masses felt. Normal bowel sounds heard. Central nervous system: Alert and oriented. No  focal neurological deficits. Extremities: No C/C/E, +pedal pulses Skin: No rashes, lesions or ulcers Psychiatry: Judgement and insight appear normal. Mood & affect appropriate.     Data Reviewed: I have personally reviewed following labs and imaging studies  CBC: Recent Labs  Lab 06/02/17 1420 06/03/17 0533  WBC 13.1* 10.1  NEUTROABS 10.5*  --   HGB 13.1 10.0*  HCT 40.4 31.0*  MCV 84.0 83.8  PLT 299 546   Basic Metabolic Panel: Recent Labs  Lab 06/02/17 1420 06/02/17 1610 06/03/17 0533  NA 140  --  142  K 2.9*  --  3.0*  CL 98*  --  102  CO2 24  --  27  GLUCOSE 222*  --  147*  BUN 19  --  21*  CREATININE 1.56*  --  1.65*  CALCIUM 9.0  --  8.4*  MG  --  1.3* 1.8   GFR: Estimated Creatinine Clearance: 32 mL/min (A) (by C-G formula based on SCr of 1.65 mg/dL (H)). Liver Function Tests: Recent Labs  Lab 06/02/17 1420  AST 27  ALT 14*  ALKPHOS 129*  BILITOT 1.2  PROT 7.3  ALBUMIN 3.1*   Recent Labs  Lab 06/02/17 1420  LIPASE 72*   No results for input(s): AMMONIA in the last 168 hours. Coagulation Profile: No results for input(s): INR, PROTIME in the last 168 hours. Cardiac Enzymes: Recent Labs  Lab 06/02/17 1420  TROPONINI <0.03   BNP (last 3 results) No results for input(s): PROBNP in the last 8760 hours. HbA1C: No results for input(s): HGBA1C in the last 72 hours. CBG: Recent Labs  Lab 06/02/17 1355  GLUCAP 189*   Lipid Profile: No results for input(s): CHOL, HDL, LDLCALC, TRIG, CHOLHDL, LDLDIRECT in the last 72 hours. Thyroid Function Tests: Recent Labs    06/03/17 0533  TSH 1.311   Anemia Panel: No results for input(s): VITAMINB12, FOLATE, FERRITIN, TIBC, IRON, RETICCTPCT in the last 72 hours. Urine analysis:    Component Value Date/Time   COLORURINE AMBER (A) 06/02/2017 1430   APPEARANCEUR TURBID (A) 06/02/2017 1430   LABSPEC 1.017 06/02/2017 1430   PHURINE 6.0 06/02/2017 1430   GLUCOSEU 50 (A) 06/02/2017 1430   HGBUR LARGE  (A) 06/02/2017 1430   BILIRUBINUR NEGATIVE 06/02/2017 1430   KETONESUR NEGATIVE 06/02/2017 1430   PROTEINUR 100 (A) 06/02/2017 1430   UROBILINOGEN 0.2 02/08/2011 1428   NITRITE NEGATIVE 06/02/2017 1430   LEUKOCYTESUR NEGATIVE 06/02/2017 1430   Sepsis Labs: @LABRCNTIP (procalcitonin:4,lacticidven:4)  ) Recent Results (from the past 240 hour(s))  C difficile quick scan w PCR reflex     Status: None   Collection Time: 06/03/17  2:15 AM  Result Value Ref Range Status   C Diff antigen NEGATIVE NEGATIVE Final   C Diff toxin NEGATIVE NEGATIVE Final   C Diff interpretation No C. difficile detected.  Final    Comment: VALID Performed at [facility], [address], [place], [place] [address]          Radiology Studies: Ct Head Wo Contrast  Result Date: 06/02/2017 CLINICAL DATA:  84 year old male with syncope, hypotension. Vomiting and  abdominal pain. EXAM: CT HEAD WITHOUT CONTRAST TECHNIQUE: Contiguous axial images were obtained from the base of the skull through the vertex without intravenous contrast. COMPARISON:  Head CT without contrast 04/23/2017 and earlier. FINDINGS: Brain: Chronic ventriculomegaly and cerebral volume appears stable. Mild patchy bilateral periventricular white matter hypodensity appears stable. No midline shift, ventriculomegaly, mass effect, evidence of mass lesion, intracranial hemorrhage or evidence of cortically based acute infarction. No cortical encephalomalacia identified. Vascular: Mild Calcified atherosclerosis at the skull base. No suspicious intracranial vascular hyperdensity. Skull: Stable and negative. No acute osseous abnormality identified. Sinuses/Orbits: Visualized paranasal sinuses and mastoids are stable and well pneumatized. Other: No acute orbit or scalp soft tissue finding. IMPRESSION: 1.  No acute intracranial abnormality. 2.  Stable non contrast CT appearance of the brain since 2018. Electronically Signed   By: Genevie Ann M.D.   On: 06/02/2017 15:42    Dg Abd Acute W/chest  Result Date: 06/02/2017 CLINICAL DATA:  Nausea, vomiting, and diarrhea EXAM: DG ABDOMEN ACUTE W/ 1V CHEST COMPARISON:  Chest x-ray and abdominal CT 05/21/2017. FINDINGS: Normal heart size and mediastinal contours. There is no edema, consolidation, effusion, or pneumothorax. Normal bowel gas pattern. No concerning mass effect or gas collection. Cholecystectomy clips. Osteopenia and bulky spondylosis. IMPRESSION: No acute finding in the chest or abdomen. Electronically Signed   By: Monte Fantasia M.D.   On: 06/02/2017 15:31        Scheduled Meds: . apixaban  2.5 mg Oral BID  . atorvastatin  10 mg Oral QPM  . feeding supplement (ENSURE ENLIVE)  237 mL Oral BID BM  . fludrocortisone  0.1 mg Oral Daily  . midodrine  5 mg Oral BID WC  . multivitamin-lutein  1 capsule Oral Daily  . pantoprazole  40 mg Oral Daily  . potassium chloride  40 mEq Oral Once  . sertraline  100 mg Oral QPM  . sodium chloride flush  3 mL Intravenous Q12H   Continuous Infusions: . lactated ringers 75 mL/hr at 06/03/17 0600     LOS: 0 days    Time spent: 30 minutes. Greater than 50% of this time was spent in direct contact with the patient coordinating care.     Lelon Frohlich, MD [facility] Pager [phone]  If 7PM-7AM, please contact night-coverage [email or web address] Password [email or web address] 06/03/2017, 12:02 PM

## 2017-06-03 NOTE — Evaluation (Signed)
Physical Therapy Evaluation Patient Details Name: Don Hall. MRN: [id number] DOB: 1928-01-21 Today's Date: 06/03/2017   History of Present Illness  Don Hall. is a 84 y.o. male with medical history significant of DVT; HLD; afib; stage 3 CKD; HTN; and 2 recent hospitalizations for deep calf ulceration requiring wound vac and skin grafting (2/24-28) followed by hospitalization 3/23-26 for acute idiopathic pancreatitis.  He reports that he has been "vomiting a whole lot".  He started about a week ago.  One day, he vomited 3 times.  He has continued to vomit since he left the hospital.  He is not eating and drinking very well.  +dizzy, lightheaded.  His son said he passed out today for about 5 minutes. That is not the first time this has happened.  He also had this problem "maybe a month ago."    Clinical Impression  Pt received in bed and was agreeable to PT evaluation. Pt with multiple hospital admissions since November 2018 and per pt and chart review, he has not been able to walk since 12/2016. He reports he went to a nursing home after his hospital stay in 12/2016 and when asked if he received therapy services while there he stated "nothing worth writing home about." Pt had incontinence of BM when PT went to mobilize him; nurse tech assisted in cleaning and changing pt. Currently, pt was min A-min guard for bed mobility and max A to stand with RW. He stated multiple times during session that he can't stand or walk. After some encouraging, pt willing to attempt standing at bedside. He was max A and had poor standing balance once in full upright standing position. PT recommending SNF upon d/c for further rehab to maximize his strength, balance and overall functional mobility to promote more independence at home. Pt agreeable with this plan.     Follow Up Recommendations SNF    Equipment Recommendations  None recommended by PT    Recommendations for Other Services       Precautions  / Restrictions Precautions Precautions: Fall Precaution Comments: weakness Restrictions Weight Bearing Restrictions: No      Mobility  Bed Mobility Overal bed mobility: Needs Assistance Bed Mobility: Rolling;Supine to Sit;Sit to Supine Rolling: Min assist   Supine to sit: Min guard;HOB elevated Sit to supine: Min guard      Transfers Overall transfer level: Needs assistance Equipment used: Rolling walker (2 wheeled) Transfers: Sit to/from Stand Sit to Stand: Max assist         General transfer comment: pt repeatedly saying "I can't stand and I can't walk." After a few mins of encouragement, pt agreed to attempt standing 1 time. Max A and poor standing balance once up but he was able to stand.  Ambulation/Gait                Stairs            Wheelchair Mobility    Modified Rankin (Stroke Patients Only)       Balance Overall balance assessment: Needs assistance Sitting-balance support: Feet supported Sitting balance-Leahy Scale: Good     Standing balance support: Bilateral upper extremity supported Standing balance-Leahy Scale: Poor Standing balance comment: increased posterior lean and cues to attain full upright position                             Pertinent Vitals/Pain Pain Assessment: No/denies pain    Home Living  Family/patient expects to be discharged to:: Private residence Living Arrangements: Spouse/significant other Available Help at Discharge: Family Type of Home: House Home Access: Stairs to enter Entrance Stairs-Rails: Right;Left;Can reach both Entrance Stairs-Number of Steps: 2 in front; 3 on side Home Layout: One level Home Equipment: [place] - 4 wheels;Bedside commode;Shower seat;Wheelchair - manual      Prior Function Level of Independence: Needs assistance   Gait / Transfers Assistance Needed: reports has not been able to walk since Nov 2018.  States sons help transfer him to [facility] when they are there  ADL's /  Homemaking Assistance Needed: has aides that help 11 hours 5 days a week and daughter who is an Therapist, sports comes most weekends  Comments: Was completely independent, driving, walking without assistive device prior to inital injury that caused hematoma back in November; Pt has only been home from SNF for 1 day.      Hand Dominance   Dominant Hand: Right    Extremity/Trunk Assessment   Upper Extremity Assessment Upper Extremity Assessment: Defer to OT evaluation    Lower Extremity Assessment Lower Extremity Assessment: Generalized weakness RLE Deficits / Details: Grossly 4-/5 strength throughout LLE Deficits / Details: Grossly 4/5 strength throughout       Communication   Communication: No difficulties  Cognition Arousal/Alertness: Awake/alert Behavior During Therapy: WFL for tasks assessed/performed Overall Cognitive Status: Within Functional Limits for tasks assessed                                        General Comments      Exercises     Assessment/Plan    PT Assessment Patient needs continued PT services  PT Problem List Decreased strength;Decreased activity tolerance;Decreased balance;Decreased mobility;Decreased safety awareness       PT Treatment Interventions DME instruction;Gait training;Functional mobility training;Therapeutic activities;Therapeutic exercise;Balance training;Patient/family education    PT Goals (Current goals can be found in the Care Plan section)  Acute Rehab PT Goals Patient Stated Goal: to go home PT Goal Formulation: With patient Time For Goal Achievement: 06/10/17 Potential to Achieve Goals: Fair    Frequency Min 3X/week   Barriers to discharge        Co-evaluation               AM-PAC PT "6 Clicks" Daily Activity  Outcome Measure                  End of Session Equipment Utilized During Treatment: Gait belt Activity Tolerance: Patient tolerated treatment well;Patient limited by fatigue Patient  left: in bed;with call bell/phone within reach;with bed alarm set Nurse Communication: Mobility status PT Visit Diagnosis: Muscle weakness (generalized) (M62.81);Unsteadiness on feet (R26.81);Other abnormalities of gait and mobility (R26.89);Difficulty in walking, not elsewhere classified (R26.2)    Time: 2694-8546 PT Time Calculation (min) (ACUTE ONLY): 35 min   Charges:   PT Evaluation $PT Eval Low Complexity: 1 Low PT Treatments $Therapeutic Activity: 8-22 mins   PT G Codes:           Geraldine Solar PT, DPT

## 2017-06-03 NOTE — Progress Notes (Addendum)
CSW spoke with RN Lonn [place] concerning disposition planning.  Pt may discharge on Sunday according to RN Kayla.  Pt and patient's son chose [facility] for possible SNF placement.  Information was sent to [facility].   Reed Breech LCSWA [phone]

## 2017-06-04 DIAGNOSIS — N179 Acute kidney failure, unspecified: Secondary | ICD-10-CM | POA: Diagnosis not present

## 2017-06-04 DIAGNOSIS — N189 Chronic kidney disease, unspecified: Secondary | ICD-10-CM | POA: Diagnosis not present

## 2017-06-04 DIAGNOSIS — E860 Dehydration: Secondary | ICD-10-CM | POA: Diagnosis not present

## 2017-06-04 DIAGNOSIS — R531 Weakness: Secondary | ICD-10-CM | POA: Diagnosis not present

## 2017-06-04 DIAGNOSIS — R55 Syncope and collapse: Secondary | ICD-10-CM | POA: Diagnosis not present

## 2017-06-04 LAB — URINE CULTURE: CULTURE: NO GROWTH

## 2017-06-04 MED ORDER — FLUDROCORTISONE ACETATE 0.1 MG PO TABS: 0.1000 mg | ORAL_TABLET | Freq: Every day | ORAL | 2 refills | Status: DC

## 2017-06-04 MED ORDER — MAGNESIUM OXIDE 400 (241.3 MG) MG PO TABS: 400.0000 mg | ORAL_TABLET | Freq: Three times a day (TID) | ORAL | 0 refills | Status: DC

## 2017-06-04 MED ORDER — FLUDROCORTISONE ACETATE 0.1 MG PO TABS: 0.1000 mg | ORAL_TABLET | Freq: Every day | ORAL | 2 refills | Status: AC

## 2017-06-04 MED ORDER — MAGNESIUM OXIDE 400 (241.3 MG) MG PO TABS: 400.0000 mg | ORAL_TABLET | Freq: Three times a day (TID) | ORAL | 0 refills | Status: AC

## 2017-06-04 NOTE — Progress Notes (Addendum)
CSW attempted to contact pt's son concerning disposition planning.  CSW spoke with Hinton Dyer at [facility]. Pt's son will take patient home due to  6 days remaining for insurance co pay. Left voice message for pt's son to contact CSW.  Reed Breech LCSWA [phone]

## 2017-06-04 NOTE — Discharge Summary (Signed)
Physician Discharge Summary  Don Hall. [id number] DOB: Jan 01, 1928 DOA: 06/02/2017  PCP: Caryl Bis, MD  Admit date: 06/02/2017 Discharge date: 06/04/2017  Time spent: 45 minutes  Recommendations for Outpatient Follow-up:  -To be discharged home today. -Advised to follow-up with primary care provider in 2 weeks. -This hospitalization we have started Florinef for his significant orthostatic hypotension and syncope. -We will need continued efforts by outpatient PCP and social work in order for SNF placement.  Discharge Diagnoses:  Principal Problem:   Syncope Active Problems:   Essential hypertension, benign   Paroxysmal atrial fibrillation (HCC)   Long term (current) use of anticoagulants   Generalized weakness   Orthostatic hypotension   Malnutrition of moderate degree   Acute kidney injury superimposed on CKD [name])   Discharge Condition: Stable and improved  Filed Weights   06/02/17 1339 06/04/17 0535  Weight: 74.8 kg (165 lb) 76.2 kg (167 lb 15.9 oz)    History of present illness:  As per Dr. Lorin Mercy on 4/5: Don Hall. is a 84 y.o. male with medical history significant of DVT; HLD; afib; stage 3 CKD; HTN; and 2 recent hospitalizations for deep calf ulceration requiring wound vac and skin grafting (2/24-28) followed by hospitalization 3/23-26 for acute idiopathic pancreatitis.  He reports that he has been "vomiting a whole lot".  He started about a week ago.  One day, he vomited 3 times.  He has continued to vomit since he left the hospital.  He is not eating and drinking very well.  +dizzy, lightheaded.  His son said he passed out today for about 5 minutes. That is not the first time this has happened.  He also had this problem "maybe a month ago."    Family was not present at the time of my evaluation.  Per ER notes, he has had ongoing intermittent n/v/d and progressive weakness and poor PO intake.  He was seen by his PCP today and sent to the ER.   His son moved his from the car to the wheelchair here and he had syncope lasting about 1 minute.  He has had loose stools.  He was 82% on RA on arrival and placed on 4L [place] O2.   ED Course:  Hypotensive on arrival, presenting with syncope.  Recent hospitalization for pancreatitis and diarrhea.  Poor PO intake, clinically dehydrated.  Creatinine 1.56.    Hospital Course:   Syncopal event -With orthostatic hypotension on admission. -Also believed to have a component of dehydration and intravascular volume depletion given recent GI illness. -Patient is nonambulatory at baseline but was orthostatic from  lying to sitting. -He is not on any antihypertensive agents. -Received IVF. -Is on midodrine which I will continue. -Have discussed with daughter compression stockings, which he refuses to wear, have also discussed Florinef which I have decided to initiate at low dose of 0.1 mg daily given his recurrent episodes of orthostatic hypotension and syncope. -Patient's family is interested in pursuing SNF, however after discussion with social work, we are not able to accomplish this from the inpatient setting without family incurring in significant co-pays.  Will need continued efforts by his outpatient PCP and social worker for SNF placement.    Acute on chronic kidney disease stage III -Baseline creatinine appears to be around 1.4. -Creatinine on admission was 1.56. -Suspect this is more likely to be a new baseline for him as his creatinine has fluctuated between 1.2 and 1.9 for the past 2-3  months.  Paroxysmal atrial fibrillation  -Rate controlled, continue anticoagulation with Eliquis.  Hypokalemia/hypomagnesemia -Likely secondary to recent emesis, hypokalemia has been replaced. -Magnesium was low at 1.3 on admission,-is up to 1.8 today after repletion.  Will start daily supplementation with 400 mg 3 times daily of magnesium oxide for 7 days.  Nausea, vomiting, diarrhea -Nausea and  vomiting have resolved since admission. -Suspect acute viral gastroenteritis.     Procedures:  None   Consultations:  None  Discharge Instructions  Discharge Instructions    Increase activity slowly   Complete by:  As directed      Allergies as of 06/04/2017   No Known Allergies     Medication List    TAKE these medications   acetaminophen 325 MG tablet Commonly known as:  TYLENOL Take 650 mg by mouth every 6 (six) hours as needed for mild pain.   apixaban 2.5 MG Tabs tablet Commonly known as:  ELIQUIS Take 1 tablet (2.5 mg total) by mouth 2 (two) times daily.   atorvastatin 10 MG tablet Commonly known as:  LIPITOR Take 10 mg by mouth every evening.   collagenase ointment Commonly known as:  SANTYL Apply topically daily. Apply Santyl to right heel wound Q day, then cover with moist gauze 2X2 and foam dressing.  (Change foam dressing Q 3 days or PRN soiling.) What changed:    how much to take  additional instructions   fludrocortisone 0.1 MG tablet Commonly known as:  FLORINEF Take 1 tablet (0.1 mg total) by mouth daily. Start taking on:  06/05/2017   iron polysaccharides 150 MG capsule Commonly known as:  NIFEREX Take 150 mg by mouth 2 (two) times daily.   lactose free nutrition Liqd Take 237 mLs by mouth 2 (two) times daily between meals.   magnesium oxide 400 (241.3 Mg) MG tablet Commonly known as:  MAG-OX Take 1 tablet (400 mg total) by mouth 3 (three) times daily for 7 days.   midodrine 5 MG tablet Commonly known as:  PROAMATINE Take 5 mg by mouth 2 (two) times daily with a meal.   neomycin-bacitracin-polymyxin Oint Commonly known as:  NEOSPORIN Apply 1 application topically daily. Apply Neosporin to edges of left leg wound Q day, then cover with nonadherent dressing and kerlex   ondansetron 4 MG tablet Commonly known as:  ZOFRAN Take 4 mg by mouth every 8 (eight) hours as needed for nausea or vomiting.   pantoprazole 40 MG  tablet Commonly known as:  PROTONIX Take 40 mg by mouth daily.   PRESERVISION AREDS 2 PO Take 1 tablet by mouth daily.   sertraline 100 MG tablet Commonly known as:  ZOLOFT Take 100 mg by mouth every evening.      No Known Allergies Follow-up Information    Caryl Bis, MD. Schedule an appointment as soon as possible for a visit in 2 week(s).   Specialty:  Family Medicine Contact information: [place] [address] [phone]            The results of significant diagnostics from this hospitalization (including imaging, microbiology, ancillary and laboratory) are listed below for reference.    Significant Diagnostic Studies: Ct Abdomen Pelvis Wo Contrast  Result Date: 05/21/2017 CLINICAL DATA:  84 year old male with bilateral mid abdominal pain and vomiting. EXAM: CT ABDOMEN AND PELVIS WITHOUT CONTRAST TECHNIQUE: Multidetector CT imaging of the abdomen and pelvis was performed following the standard protocol without IV contrast. COMPARISON:  01/11/2017 FINDINGS: Lower chest: Mild cardiac  enlargement with coronary arteriosclerosis. No pericardial effusion. Mild bronchiectasis in the lower lobes with subsegmental atelectasis. Hepatobiliary: Cholecystectomy. No biliary dilatation or space-occupying mass. Colonic interposition is not noted between the liver and ventral abdominal wall. Pancreas: Hazy peripancreatic mesenteric fatty induration suspicious for uncomplicated acute pancreatitis. No mass or ductal dilatation. Spleen: Normal Adrenals/Urinary Tract: Normal bilateral adrenal glands. Bilateral renal cysts are noted the largest in the upper pole of the left kidney measuring 3.7 x 5 x 4 cm. Bilateral renal pelvic calculi are noted without hydronephrosis as well as renal vascular calcifications on the left. The largest calculus in the left renal pelvis, slightly staghorn appearance measures approximately 14 x 11 x 8 mm. The largest in the right renal pelvis measures 4  mm. Stomach/Bowel: Stomach is within normal limits. Appendix appears not confidently identified but no pericecal inflammation is seen. No evidence of bowel wall thickening, distention, or inflammatory changes. Moderate amount of retained stool within the rectosigmoid. Vascular/Lymphatic: Mild to moderate aortoiliac and branch vessel atherosclerosis without aneurysm. No adenopathy by CT size criteria. Reproductive: Normal size prostate and seminal vesicles. Other: No free air nor free fluid. Fat noted within both inguinal canals. Musculoskeletal: Diffuse idiopathic skeletal hyperostosis. Thoracolumbar spondylosis with multilevel degenerative disc disease and facet arthropathy. IMPRESSION: 1. Mild cardiomegaly with coronary arteriosclerosis and lower lobe bronchiectasis. 2. Hazy peripancreatic fat suspicious for changes of acute uncomplicated pancreatitis. 3. Bilateral renal pelvic and left intrarenal calculi without hydronephrosis. Bilateral renal cysts. 4. Moderate stool burden in the rectosigmoid question fecal impaction. Electronically Signed   By: Ashley Royalty M.D.   On: 05/21/2017 01:25   Dg Chest 2 View  Result Date: 05/21/2017 CLINICAL DATA:  Weakness and vomiting. EXAM: CHEST - 2 VIEW COMPARISON:  Radiograph 04/23/2017. Lung bases from abdominal CT 1 hour prior. FINDINGS: Unchanged heart size and mediastinal contours with aortic atherosclerosis. Mild bibasilar atelectasis. Pulmonary vasculature is normal. No consolidation, pleural effusion, or pneumothorax. No acute osseous abnormalities are seen. IMPRESSION: Mild bibasilar atelectasis. Electronically Signed   By: Jeb Levering M.D.   On: 05/21/2017 01:40   Ct Head Wo Contrast  Result Date: 06/02/2017 CLINICAL DATA:  84 year old male with syncope, hypotension. Vomiting and abdominal pain. EXAM: CT HEAD WITHOUT CONTRAST TECHNIQUE: Contiguous axial images were obtained from the base of the skull through the vertex without intravenous contrast.  COMPARISON:  Head CT without contrast 04/23/2017 and earlier. FINDINGS: Brain: Chronic ventriculomegaly and cerebral volume appears stable. Mild patchy bilateral periventricular white matter hypodensity appears stable. No midline shift, ventriculomegaly, mass effect, evidence of mass lesion, intracranial hemorrhage or evidence of cortically based acute infarction. No cortical encephalomalacia identified. Vascular: Mild Calcified atherosclerosis at the skull base. No suspicious intracranial vascular hyperdensity. Skull: Stable and negative. No acute osseous abnormality identified. Sinuses/Orbits: Visualized paranasal sinuses and mastoids are stable and well pneumatized. Other: No acute orbit or scalp soft tissue finding. IMPRESSION: 1.  No acute intracranial abnormality. 2.  Stable non contrast CT appearance of the brain since 2018. Electronically Signed   By: Genevie Ann M.D.   On: 06/02/2017 15:42   Dg Abd Acute W/chest  Result Date: 06/02/2017 CLINICAL DATA:  Nausea, vomiting, and diarrhea EXAM: DG ABDOMEN ACUTE W/ 1V CHEST COMPARISON:  Chest x-ray and abdominal CT 05/21/2017. FINDINGS: Normal heart size and mediastinal contours. There is no edema, consolidation, effusion, or pneumothorax. Normal bowel gas pattern. No concerning mass effect or gas collection. Cholecystectomy clips. Osteopenia and bulky spondylosis. IMPRESSION: No acute finding in the chest or abdomen. Electronically  Signed   By: Monte Fantasia M.D.   On: 06/02/2017 15:31    Microbiology: Recent Results (from the past 240 hour(s))  Urine culture     Status: None   Collection Time: 06/02/17  2:30 PM  Result Value Ref Range Status   Specimen Description   Final    URINE, CATHETERIZED Performed at [facility], [address] [place], [place] [address]    Special Requests   Final    NONE Performed at [facility], [address], [place], [place] [address]    Culture   Final    NO GROWTH Performed at [facility], [place]  [address] [place], [place] [address]    Report Status 06/04/2017 FINAL  Final  Gastrointestinal Panel by PCR , Stool     Status: None   Collection Time: 06/03/17  2:15 AM  Result Value Ref Range Status   Campylobacter species NOT DETECTED NOT DETECTED Final   Plesimonas shigelloides NOT DETECTED NOT DETECTED Final   Salmonella species NOT DETECTED NOT DETECTED Final   Yersinia enterocolitica NOT DETECTED NOT DETECTED Final   Vibrio species NOT DETECTED NOT DETECTED Final   Vibrio cholerae NOT DETECTED NOT DETECTED Final   Enteroaggregative E coli (EAEC) NOT DETECTED NOT DETECTED Final   Enteropathogenic E coli (EPEC) NOT DETECTED NOT DETECTED Final   Enterotoxigenic E coli (ETEC) NOT DETECTED NOT DETECTED Final   Shiga like toxin producing E coli (STEC) NOT DETECTED NOT DETECTED Final   Shigella/Enteroinvasive E coli (EIEC) NOT DETECTED NOT DETECTED Final   Cryptosporidium NOT DETECTED NOT DETECTED Final   Cyclospora cayetanensis NOT DETECTED NOT DETECTED Final   Entamoeba histolytica NOT DETECTED NOT DETECTED Final   Giardia lamblia NOT DETECTED NOT DETECTED Final   Adenovirus F40/41 NOT DETECTED NOT DETECTED Final   Astrovirus NOT DETECTED NOT DETECTED Final   Norovirus GI/GII NOT DETECTED NOT DETECTED Final   Rotavirus A NOT DETECTED NOT DETECTED Final   Sapovirus (I, II, IV, and V) NOT DETECTED NOT DETECTED Final    Comment: Performed at [facility], [address] [place], [place] [address]  C difficile quick scan w PCR reflex     Status: None   Collection Time: 06/03/17  2:15 AM  Result Value Ref Range Status   C Diff antigen NEGATIVE NEGATIVE Final   C Diff toxin NEGATIVE NEGATIVE Final   C Diff interpretation No C. difficile detected.  Final    Comment: VALID Performed at [facility], [address] [place], [place] [address]      Labs: Basic Metabolic Panel: Recent Labs  Lab 06/02/17 1420 06/02/17 1610 06/03/17 0533  NA 140  --  142  K 2.9*  --  3.0*  CL  98*  --  102  CO2 24  --  27  GLUCOSE 222*  --  147*  BUN 19  --  21*  CREATININE 1.56*  --  1.65*  CALCIUM 9.0  --  8.4*  MG  --  1.3* 1.8   Liver Function Tests: Recent Labs  Lab 06/02/17 1420  AST 27  ALT 14*  ALKPHOS 129*  BILITOT 1.2  PROT 7.3  ALBUMIN 3.1*   Recent Labs  Lab 06/02/17 1420  LIPASE 72*   No results for input(s): AMMONIA in the last 168 hours. CBC: Recent Labs  Lab 06/02/17 1420 06/03/17 0533  WBC 13.1* 10.1  NEUTROABS 10.5*  --   HGB 13.1 10.0*  HCT 40.4 31.0*  MCV 84.0 83.8  PLT 299 295   Cardiac Enzymes: Recent Labs  Lab 06/02/17 1420  TROPONINI <0.03   BNP: BNP (last 3 results) No results for input(s): BNP in the last 8760 hours.  ProBNP (last 3 results) No results for input(s): PROBNP in the last 8760 hours.  CBG: Recent Labs  Lab 06/02/17 1355  GLUCAP 189*       Signed:  [facility] Pager: [phone] 06/04/2017, 12:01 PM

## 2017-06-05 DIAGNOSIS — I83022 Varicose veins of left lower extremity with ulcer of calf: Secondary | ICD-10-CM | POA: Diagnosis not present

## 2017-06-05 DIAGNOSIS — K8500 Idiopathic acute pancreatitis without necrosis or infection: Secondary | ICD-10-CM | POA: Diagnosis not present

## 2017-06-07 DIAGNOSIS — K8500 Idiopathic acute pancreatitis without necrosis or infection: Secondary | ICD-10-CM | POA: Diagnosis not present

## 2017-06-07 DIAGNOSIS — I83022 Varicose veins of left lower extremity with ulcer of calf: Secondary | ICD-10-CM | POA: Diagnosis not present

## 2017-06-08 DIAGNOSIS — K8500 Idiopathic acute pancreatitis without necrosis or infection: Secondary | ICD-10-CM | POA: Diagnosis not present

## 2017-06-08 DIAGNOSIS — I83022 Varicose veins of left lower extremity with ulcer of calf: Secondary | ICD-10-CM | POA: Diagnosis not present

## 2017-06-09 DIAGNOSIS — I83022 Varicose veins of left lower extremity with ulcer of calf: Secondary | ICD-10-CM | POA: Diagnosis not present

## 2017-06-09 DIAGNOSIS — K8500 Idiopathic acute pancreatitis without necrosis or infection: Secondary | ICD-10-CM | POA: Diagnosis not present

## 2017-06-12 DIAGNOSIS — K8500 Idiopathic acute pancreatitis without necrosis or infection: Secondary | ICD-10-CM | POA: Diagnosis not present

## 2017-06-12 DIAGNOSIS — I83022 Varicose veins of left lower extremity with ulcer of calf: Secondary | ICD-10-CM | POA: Diagnosis not present

## 2017-06-14 NOTE — ED Provider Notes (Signed)
CRITICAL CARE Performed by: Jola Schmidt Total critical care time: 33 minutes Critical care time was exclusive of separately billable procedures and treating other patients. Critical care was necessary to treat or prevent imminent or life-threatening deterioration. Critical care was time spent personally by me on the following activities: development of treatment plan with patient and/or surrogate as well as nursing, discussions with consultants, evaluation of patient's response to treatment, examination of patient, obtaining history from patient or surrogate, ordering and performing treatments and interventions, ordering and review of laboratory studies, ordering and review of radiographic studies, pulse oximetry and re-evaluation of patient's condition.     Addendum to documentation for 05/20/2017 visit     Jola Schmidt, MD 06/14/17 [phone]

## 2017-06-15 DIAGNOSIS — K8500 Idiopathic acute pancreatitis without necrosis or infection: Secondary | ICD-10-CM | POA: Diagnosis not present

## 2017-06-15 DIAGNOSIS — I83022 Varicose veins of left lower extremity with ulcer of calf: Secondary | ICD-10-CM | POA: Diagnosis not present

## 2017-06-16 DIAGNOSIS — I83022 Varicose veins of left lower extremity with ulcer of calf: Secondary | ICD-10-CM | POA: Diagnosis not present

## 2017-06-16 DIAGNOSIS — K8500 Idiopathic acute pancreatitis without necrosis or infection: Secondary | ICD-10-CM | POA: Diagnosis not present

## 2017-06-19 DIAGNOSIS — K8500 Idiopathic acute pancreatitis without necrosis or infection: Secondary | ICD-10-CM | POA: Diagnosis not present

## 2017-06-19 DIAGNOSIS — I83022 Varicose veins of left lower extremity with ulcer of calf: Secondary | ICD-10-CM | POA: Diagnosis not present

## 2017-06-20 DIAGNOSIS — I83022 Varicose veins of left lower extremity with ulcer of calf: Secondary | ICD-10-CM | POA: Diagnosis not present

## 2017-06-20 DIAGNOSIS — K8500 Idiopathic acute pancreatitis without necrosis or infection: Secondary | ICD-10-CM | POA: Diagnosis not present

## 2017-06-21 DIAGNOSIS — K8500 Idiopathic acute pancreatitis without necrosis or infection: Secondary | ICD-10-CM | POA: Diagnosis not present

## 2017-06-21 DIAGNOSIS — I83022 Varicose veins of left lower extremity with ulcer of calf: Secondary | ICD-10-CM | POA: Diagnosis not present

## 2017-06-22 DIAGNOSIS — E440 Moderate protein-calorie malnutrition: Secondary | ICD-10-CM | POA: Diagnosis not present

## 2017-06-22 DIAGNOSIS — K859 Acute pancreatitis without necrosis or infection, unspecified: Secondary | ICD-10-CM | POA: Diagnosis not present

## 2017-06-22 DIAGNOSIS — I83022 Varicose veins of left lower extremity with ulcer of calf: Secondary | ICD-10-CM | POA: Diagnosis not present

## 2017-06-22 DIAGNOSIS — K8500 Idiopathic acute pancreatitis without necrosis or infection: Secondary | ICD-10-CM | POA: Diagnosis not present

## 2017-06-26 DIAGNOSIS — E1122 Type 2 diabetes mellitus with diabetic chronic kidney disease: Secondary | ICD-10-CM | POA: Diagnosis present

## 2017-06-26 DIAGNOSIS — R0600 Dyspnea, unspecified: Secondary | ICD-10-CM | POA: Diagnosis present

## 2017-06-26 DIAGNOSIS — I6789 Other cerebrovascular disease: Secondary | ICD-10-CM | POA: Diagnosis not present

## 2017-06-26 DIAGNOSIS — R0602 Shortness of breath: Secondary | ICD-10-CM | POA: Diagnosis not present

## 2017-06-26 DIAGNOSIS — R410 Disorientation, unspecified: Secondary | ICD-10-CM | POA: Diagnosis not present

## 2017-06-26 DIAGNOSIS — R404 Transient alteration of awareness: Secondary | ICD-10-CM | POA: Diagnosis not present

## 2017-06-26 DIAGNOSIS — R29706 NIHSS score 6: Secondary | ICD-10-CM | POA: Diagnosis present

## 2017-06-26 DIAGNOSIS — N183 Chronic kidney disease, stage 3 (moderate): Secondary | ICD-10-CM | POA: Diagnosis present

## 2017-06-26 DIAGNOSIS — Z7984 Long term (current) use of oral hypoglycemic drugs: Secondary | ICD-10-CM | POA: Diagnosis not present

## 2017-06-26 DIAGNOSIS — R4182 Altered mental status, unspecified: Secondary | ICD-10-CM | POA: Diagnosis not present

## 2017-06-26 DIAGNOSIS — I672 Cerebral atherosclerosis: Secondary | ICD-10-CM | POA: Diagnosis not present

## 2017-06-26 DIAGNOSIS — F329 Major depressive disorder, single episode, unspecified: Secondary | ICD-10-CM | POA: Diagnosis present

## 2017-06-26 DIAGNOSIS — Z515 Encounter for palliative care: Secondary | ICD-10-CM | POA: Diagnosis present

## 2017-06-26 DIAGNOSIS — I4891 Unspecified atrial fibrillation: Secondary | ICD-10-CM | POA: Diagnosis not present

## 2017-06-26 DIAGNOSIS — Z87891 Personal history of nicotine dependence: Secondary | ICD-10-CM | POA: Diagnosis not present

## 2017-06-26 DIAGNOSIS — I639 Cerebral infarction, unspecified: Secondary | ICD-10-CM | POA: Diagnosis present

## 2017-06-26 DIAGNOSIS — G8194 Hemiplegia, unspecified affecting left nondominant side: Secondary | ICD-10-CM | POA: Diagnosis not present

## 2017-06-26 DIAGNOSIS — R5381 Other malaise: Secondary | ICD-10-CM | POA: Diagnosis not present

## 2017-06-26 DIAGNOSIS — Z79899 Other long term (current) drug therapy: Secondary | ICD-10-CM | POA: Diagnosis not present

## 2017-06-26 DIAGNOSIS — Z888 Allergy status to other drugs, medicaments and biological substances status: Secondary | ICD-10-CM | POA: Diagnosis not present

## 2017-06-26 DIAGNOSIS — Z7189 Other specified counseling: Secondary | ICD-10-CM | POA: Diagnosis not present

## 2017-06-26 DIAGNOSIS — Z743 Need for continuous supervision: Secondary | ICD-10-CM | POA: Diagnosis not present

## 2017-06-26 DIAGNOSIS — I482 Chronic atrial fibrillation: Secondary | ICD-10-CM | POA: Diagnosis present

## 2017-06-26 DIAGNOSIS — R532 Functional quadriplegia: Secondary | ICD-10-CM | POA: Diagnosis present

## 2017-06-26 DIAGNOSIS — R930 Abnormal findings on diagnostic imaging of skull and head, not elsewhere classified: Secondary | ICD-10-CM | POA: Diagnosis not present

## 2017-06-26 DIAGNOSIS — L97419 Non-pressure chronic ulcer of right heel and midfoot with unspecified severity: Secondary | ICD-10-CM | POA: Diagnosis present

## 2017-06-26 DIAGNOSIS — I6501 Occlusion and stenosis of right vertebral artery: Secondary | ICD-10-CM | POA: Diagnosis not present

## 2017-06-26 DIAGNOSIS — K219 Gastro-esophageal reflux disease without esophagitis: Secondary | ICD-10-CM | POA: Diagnosis present

## 2017-06-26 DIAGNOSIS — R52 Pain, unspecified: Secondary | ICD-10-CM | POA: Diagnosis not present

## 2017-06-26 DIAGNOSIS — Z8673 Personal history of transient ischemic attack (TIA), and cerebral infarction without residual deficits: Secondary | ICD-10-CM | POA: Diagnosis not present

## 2017-06-26 DIAGNOSIS — E43 Unspecified severe protein-calorie malnutrition: Secondary | ICD-10-CM | POA: Diagnosis not present

## 2017-06-26 DIAGNOSIS — Z9049 Acquired absence of other specified parts of digestive tract: Secondary | ICD-10-CM | POA: Diagnosis not present

## 2017-06-26 DIAGNOSIS — D649 Anemia, unspecified: Secondary | ICD-10-CM | POA: Diagnosis present

## 2017-06-26 DIAGNOSIS — R682 Dry mouth, unspecified: Secondary | ICD-10-CM | POA: Diagnosis not present

## 2017-06-26 DIAGNOSIS — R29818 Other symptoms and signs involving the nervous system: Secondary | ICD-10-CM | POA: Diagnosis not present

## 2017-06-26 DIAGNOSIS — E785 Hyperlipidemia, unspecified: Secondary | ICD-10-CM | POA: Diagnosis present

## 2017-06-26 DIAGNOSIS — R4701 Aphasia: Secondary | ICD-10-CM | POA: Diagnosis not present

## 2017-06-26 DIAGNOSIS — I129 Hypertensive chronic kidney disease with stage 1 through stage 4 chronic kidney disease, or unspecified chronic kidney disease: Secondary | ICD-10-CM | POA: Diagnosis present

## 2017-06-26 DIAGNOSIS — Z96653 Presence of artificial knee joint, bilateral: Secondary | ICD-10-CM | POA: Diagnosis not present

## 2017-06-26 DIAGNOSIS — R531 Weakness: Secondary | ICD-10-CM | POA: Diagnosis not present

## 2017-06-26 DIAGNOSIS — R1310 Dysphagia, unspecified: Secondary | ICD-10-CM | POA: Diagnosis not present

## 2017-06-26 DIAGNOSIS — Z66 Do not resuscitate: Secondary | ICD-10-CM | POA: Diagnosis present

## 2017-06-26 DIAGNOSIS — R4781 Slurred speech: Secondary | ICD-10-CM | POA: Diagnosis not present

## 2017-06-26 DIAGNOSIS — R4189 Other symptoms and signs involving cognitive functions and awareness: Secondary | ICD-10-CM | POA: Diagnosis not present

## 2017-06-26 DIAGNOSIS — Z86718 Personal history of other venous thrombosis and embolism: Secondary | ICD-10-CM | POA: Diagnosis not present

## 2017-06-26 DIAGNOSIS — E119 Type 2 diabetes mellitus without complications: Secondary | ICD-10-CM | POA: Diagnosis not present

## 2017-06-26 DIAGNOSIS — T07XXXA Unspecified multiple injuries, initial encounter: Secondary | ICD-10-CM | POA: Diagnosis not present

## 2017-06-26 DIAGNOSIS — Z7401 Bed confinement status: Secondary | ICD-10-CM | POA: Diagnosis not present

## 2017-06-26 DIAGNOSIS — Z7901 Long term (current) use of anticoagulants: Secondary | ICD-10-CM | POA: Diagnosis not present

## 2017-06-26 DIAGNOSIS — L97229 Non-pressure chronic ulcer of left calf with unspecified severity: Secondary | ICD-10-CM | POA: Diagnosis present

## 2017-06-26 DIAGNOSIS — E876 Hypokalemia: Secondary | ICD-10-CM | POA: Diagnosis not present

## 2017-06-26 DIAGNOSIS — M6281 Muscle weakness (generalized): Secondary | ICD-10-CM | POA: Diagnosis not present

## 2017-06-26 DIAGNOSIS — Z79891 Long term (current) use of opiate analgesic: Secondary | ICD-10-CM | POA: Diagnosis not present

## 2017-07-04 DIAGNOSIS — I951 Orthostatic hypotension: Secondary | ICD-10-CM | POA: Diagnosis not present

## 2017-07-18 DIAGNOSIS — R279 Unspecified lack of coordination: Secondary | ICD-10-CM | POA: Diagnosis not present

## 2017-07-18 DIAGNOSIS — Z7401 Bed confinement status: Secondary | ICD-10-CM | POA: Diagnosis not present

## 2017-09-05 DIAGNOSIS — R279 Unspecified lack of coordination: Secondary | ICD-10-CM | POA: Diagnosis not present

## 2017-09-05 DIAGNOSIS — L97119 Non-pressure chronic ulcer of right thigh with unspecified severity: Secondary | ICD-10-CM | POA: Diagnosis not present

## 2017-09-05 DIAGNOSIS — T8189XD Other complications of procedures, not elsewhere classified, subsequent encounter: Secondary | ICD-10-CM | POA: Diagnosis not present

## 2017-09-05 DIAGNOSIS — Z743 Need for continuous supervision: Secondary | ICD-10-CM | POA: Diagnosis not present

## 2017-09-08 DIAGNOSIS — Z7401 Bed confinement status: Secondary | ICD-10-CM | POA: Diagnosis not present

## 2017-09-08 DIAGNOSIS — S81802A Unspecified open wound, left lower leg, initial encounter: Secondary | ICD-10-CM | POA: Diagnosis not present

## 2017-09-08 DIAGNOSIS — R279 Unspecified lack of coordination: Secondary | ICD-10-CM | POA: Diagnosis not present

## 2017-09-13 DIAGNOSIS — T8189XD Other complications of procedures, not elsewhere classified, subsequent encounter: Secondary | ICD-10-CM | POA: Diagnosis not present

## 2017-09-22 DIAGNOSIS — T8189XD Other complications of procedures, not elsewhere classified, subsequent encounter: Secondary | ICD-10-CM | POA: Diagnosis not present

## 2017-09-22 DIAGNOSIS — I959 Hypotension, unspecified: Secondary | ICD-10-CM | POA: Diagnosis not present

## 2017-09-22 DIAGNOSIS — R000 Tachycardia, unspecified: Secondary | ICD-10-CM | POA: Diagnosis not present

## 2017-09-22 DIAGNOSIS — I4891 Unspecified atrial fibrillation: Secondary | ICD-10-CM | POA: Diagnosis not present

## 2017-09-22 DIAGNOSIS — I499 Cardiac arrhythmia, unspecified: Secondary | ICD-10-CM | POA: Diagnosis not present

## 2017-09-22 DIAGNOSIS — L97921 Non-pressure chronic ulcer of unspecified part of left lower leg limited to breakdown of skin: Secondary | ICD-10-CM | POA: Diagnosis not present

## 2017-09-22 DIAGNOSIS — R4020 Unspecified coma: Secondary | ICD-10-CM | POA: Diagnosis not present

## 2017-09-28 ENCOUNTER — Other Ambulatory Visit: Payer: Self-pay

## 2017-10-06 DIAGNOSIS — S71101D Unspecified open wound, right thigh, subsequent encounter: Secondary | ICD-10-CM | POA: Diagnosis not present

## 2017-10-06 DIAGNOSIS — S81801A Unspecified open wound, right lower leg, initial encounter: Secondary | ICD-10-CM | POA: Diagnosis not present

## 2017-10-06 DIAGNOSIS — S81802D Unspecified open wound, left lower leg, subsequent encounter: Secondary | ICD-10-CM | POA: Diagnosis not present

## 2017-10-06 DIAGNOSIS — L089 Local infection of the skin and subcutaneous tissue, unspecified: Secondary | ICD-10-CM | POA: Diagnosis not present

## 2017-10-06 DIAGNOSIS — S81802A Unspecified open wound, left lower leg, initial encounter: Secondary | ICD-10-CM | POA: Diagnosis not present

## 2017-10-12 DIAGNOSIS — E782 Mixed hyperlipidemia: Secondary | ICD-10-CM | POA: Diagnosis not present

## 2017-10-12 DIAGNOSIS — D649 Anemia, unspecified: Secondary | ICD-10-CM | POA: Diagnosis not present

## 2017-10-12 DIAGNOSIS — Z6829 Body mass index (BMI) 29.0-29.9, adult: Secondary | ICD-10-CM | POA: Diagnosis not present

## 2017-10-12 DIAGNOSIS — E1122 Type 2 diabetes mellitus with diabetic chronic kidney disease: Secondary | ICD-10-CM | POA: Diagnosis not present

## 2017-10-12 DIAGNOSIS — E440 Moderate protein-calorie malnutrition: Secondary | ICD-10-CM | POA: Diagnosis not present

## 2017-10-12 DIAGNOSIS — I482 Chronic atrial fibrillation: Secondary | ICD-10-CM | POA: Diagnosis not present

## 2017-10-12 DIAGNOSIS — E1142 Type 2 diabetes mellitus with diabetic polyneuropathy: Secondary | ICD-10-CM | POA: Diagnosis not present

## 2017-10-12 DIAGNOSIS — I10 Essential (primary) hypertension: Secondary | ICD-10-CM | POA: Diagnosis not present

## 2017-10-20 DIAGNOSIS — L089 Local infection of the skin and subcutaneous tissue, unspecified: Secondary | ICD-10-CM | POA: Diagnosis not present

## 2017-10-20 DIAGNOSIS — S81802D Unspecified open wound, left lower leg, subsequent encounter: Secondary | ICD-10-CM | POA: Diagnosis not present

## 2017-10-20 DIAGNOSIS — S81801A Unspecified open wound, right lower leg, initial encounter: Secondary | ICD-10-CM | POA: Diagnosis not present

## 2017-10-20 DIAGNOSIS — S81802A Unspecified open wound, left lower leg, initial encounter: Secondary | ICD-10-CM | POA: Diagnosis not present

## 2017-10-20 DIAGNOSIS — S71101D Unspecified open wound, right thigh, subsequent encounter: Secondary | ICD-10-CM | POA: Diagnosis not present

## 2017-10-29 DIAGNOSIS — Z9181 History of falling: Secondary | ICD-10-CM | POA: Diagnosis not present

## 2017-10-29 DIAGNOSIS — L97521 Non-pressure chronic ulcer of other part of left foot limited to breakdown of skin: Secondary | ICD-10-CM | POA: Diagnosis not present

## 2017-10-29 DIAGNOSIS — I482 Chronic atrial fibrillation: Secondary | ICD-10-CM | POA: Diagnosis not present

## 2017-10-29 DIAGNOSIS — M353 Polymyalgia rheumatica: Secondary | ICD-10-CM | POA: Diagnosis not present

## 2017-10-29 DIAGNOSIS — D649 Anemia, unspecified: Secondary | ICD-10-CM | POA: Diagnosis not present

## 2017-10-29 DIAGNOSIS — L97221 Non-pressure chronic ulcer of left calf limited to breakdown of skin: Secondary | ICD-10-CM | POA: Diagnosis not present

## 2017-10-29 DIAGNOSIS — K219 Gastro-esophageal reflux disease without esophagitis: Secondary | ICD-10-CM | POA: Diagnosis not present

## 2017-10-29 DIAGNOSIS — Z96653 Presence of artificial knee joint, bilateral: Secondary | ICD-10-CM | POA: Diagnosis not present

## 2017-10-29 DIAGNOSIS — N189 Chronic kidney disease, unspecified: Secondary | ICD-10-CM | POA: Diagnosis not present

## 2017-10-29 DIAGNOSIS — I129 Hypertensive chronic kidney disease with stage 1 through stage 4 chronic kidney disease, or unspecified chronic kidney disease: Secondary | ICD-10-CM | POA: Diagnosis not present

## 2017-10-29 DIAGNOSIS — M4727 Other spondylosis with radiculopathy, lumbosacral region: Secondary | ICD-10-CM | POA: Diagnosis not present

## 2017-10-29 DIAGNOSIS — E782 Mixed hyperlipidemia: Secondary | ICD-10-CM | POA: Diagnosis not present

## 2017-10-29 DIAGNOSIS — E1122 Type 2 diabetes mellitus with diabetic chronic kidney disease: Secondary | ICD-10-CM | POA: Diagnosis not present

## 2017-10-29 DIAGNOSIS — E1142 Type 2 diabetes mellitus with diabetic polyneuropathy: Secondary | ICD-10-CM | POA: Diagnosis not present

## 2017-10-29 DIAGNOSIS — L97111 Non-pressure chronic ulcer of right thigh limited to breakdown of skin: Secondary | ICD-10-CM | POA: Diagnosis not present

## 2017-10-29 DIAGNOSIS — M1991 Primary osteoarthritis, unspecified site: Secondary | ICD-10-CM | POA: Diagnosis not present

## 2017-10-29 DIAGNOSIS — E440 Moderate protein-calorie malnutrition: Secondary | ICD-10-CM | POA: Diagnosis not present

## 2017-10-29 DIAGNOSIS — Z7901 Long term (current) use of anticoagulants: Secondary | ICD-10-CM | POA: Diagnosis not present

## 2017-10-31 DIAGNOSIS — L97221 Non-pressure chronic ulcer of left calf limited to breakdown of skin: Secondary | ICD-10-CM | POA: Diagnosis not present

## 2017-10-31 DIAGNOSIS — L97521 Non-pressure chronic ulcer of other part of left foot limited to breakdown of skin: Secondary | ICD-10-CM | POA: Diagnosis not present

## 2017-10-31 DIAGNOSIS — M353 Polymyalgia rheumatica: Secondary | ICD-10-CM | POA: Diagnosis not present

## 2017-10-31 DIAGNOSIS — L97111 Non-pressure chronic ulcer of right thigh limited to breakdown of skin: Secondary | ICD-10-CM | POA: Diagnosis not present

## 2017-10-31 DIAGNOSIS — E1122 Type 2 diabetes mellitus with diabetic chronic kidney disease: Secondary | ICD-10-CM | POA: Diagnosis not present

## 2017-10-31 DIAGNOSIS — E1142 Type 2 diabetes mellitus with diabetic polyneuropathy: Secondary | ICD-10-CM | POA: Diagnosis not present

## 2017-11-01 DIAGNOSIS — L97111 Non-pressure chronic ulcer of right thigh limited to breakdown of skin: Secondary | ICD-10-CM | POA: Diagnosis not present

## 2017-11-01 DIAGNOSIS — L97221 Non-pressure chronic ulcer of left calf limited to breakdown of skin: Secondary | ICD-10-CM | POA: Diagnosis not present

## 2017-11-01 DIAGNOSIS — E1122 Type 2 diabetes mellitus with diabetic chronic kidney disease: Secondary | ICD-10-CM | POA: Diagnosis not present

## 2017-11-01 DIAGNOSIS — E1142 Type 2 diabetes mellitus with diabetic polyneuropathy: Secondary | ICD-10-CM | POA: Diagnosis not present

## 2017-11-01 DIAGNOSIS — L97521 Non-pressure chronic ulcer of other part of left foot limited to breakdown of skin: Secondary | ICD-10-CM | POA: Diagnosis not present

## 2017-11-01 DIAGNOSIS — M353 Polymyalgia rheumatica: Secondary | ICD-10-CM | POA: Diagnosis not present

## 2017-11-02 DIAGNOSIS — L97221 Non-pressure chronic ulcer of left calf limited to breakdown of skin: Secondary | ICD-10-CM | POA: Diagnosis not present

## 2017-11-02 DIAGNOSIS — L97521 Non-pressure chronic ulcer of other part of left foot limited to breakdown of skin: Secondary | ICD-10-CM | POA: Diagnosis not present

## 2017-11-02 DIAGNOSIS — L97111 Non-pressure chronic ulcer of right thigh limited to breakdown of skin: Secondary | ICD-10-CM | POA: Diagnosis not present

## 2017-11-02 DIAGNOSIS — M353 Polymyalgia rheumatica: Secondary | ICD-10-CM | POA: Diagnosis not present

## 2017-11-02 DIAGNOSIS — E1142 Type 2 diabetes mellitus with diabetic polyneuropathy: Secondary | ICD-10-CM | POA: Diagnosis not present

## 2017-11-02 DIAGNOSIS — E1122 Type 2 diabetes mellitus with diabetic chronic kidney disease: Secondary | ICD-10-CM | POA: Diagnosis not present

## 2017-11-03 DIAGNOSIS — E1122 Type 2 diabetes mellitus with diabetic chronic kidney disease: Secondary | ICD-10-CM | POA: Diagnosis not present

## 2017-11-03 DIAGNOSIS — L97521 Non-pressure chronic ulcer of other part of left foot limited to breakdown of skin: Secondary | ICD-10-CM | POA: Diagnosis not present

## 2017-11-03 DIAGNOSIS — L97221 Non-pressure chronic ulcer of left calf limited to breakdown of skin: Secondary | ICD-10-CM | POA: Diagnosis not present

## 2017-11-03 DIAGNOSIS — M353 Polymyalgia rheumatica: Secondary | ICD-10-CM | POA: Diagnosis not present

## 2017-11-03 DIAGNOSIS — E1142 Type 2 diabetes mellitus with diabetic polyneuropathy: Secondary | ICD-10-CM | POA: Diagnosis not present

## 2017-11-03 DIAGNOSIS — L97111 Non-pressure chronic ulcer of right thigh limited to breakdown of skin: Secondary | ICD-10-CM | POA: Diagnosis not present

## 2017-11-05 DIAGNOSIS — L97221 Non-pressure chronic ulcer of left calf limited to breakdown of skin: Secondary | ICD-10-CM | POA: Diagnosis not present

## 2017-11-05 DIAGNOSIS — E1122 Type 2 diabetes mellitus with diabetic chronic kidney disease: Secondary | ICD-10-CM | POA: Diagnosis not present

## 2017-11-05 DIAGNOSIS — M353 Polymyalgia rheumatica: Secondary | ICD-10-CM | POA: Diagnosis not present

## 2017-11-05 DIAGNOSIS — L97111 Non-pressure chronic ulcer of right thigh limited to breakdown of skin: Secondary | ICD-10-CM | POA: Diagnosis not present

## 2017-11-05 DIAGNOSIS — E1142 Type 2 diabetes mellitus with diabetic polyneuropathy: Secondary | ICD-10-CM | POA: Diagnosis not present

## 2017-11-05 DIAGNOSIS — L97521 Non-pressure chronic ulcer of other part of left foot limited to breakdown of skin: Secondary | ICD-10-CM | POA: Diagnosis not present

## 2017-11-06 DIAGNOSIS — E1142 Type 2 diabetes mellitus with diabetic polyneuropathy: Secondary | ICD-10-CM | POA: Diagnosis not present

## 2017-11-06 DIAGNOSIS — L97221 Non-pressure chronic ulcer of left calf limited to breakdown of skin: Secondary | ICD-10-CM | POA: Diagnosis not present

## 2017-11-06 DIAGNOSIS — L97521 Non-pressure chronic ulcer of other part of left foot limited to breakdown of skin: Secondary | ICD-10-CM | POA: Diagnosis not present

## 2017-11-06 DIAGNOSIS — L97111 Non-pressure chronic ulcer of right thigh limited to breakdown of skin: Secondary | ICD-10-CM | POA: Diagnosis not present

## 2017-11-06 DIAGNOSIS — E1122 Type 2 diabetes mellitus with diabetic chronic kidney disease: Secondary | ICD-10-CM | POA: Diagnosis not present

## 2017-11-06 DIAGNOSIS — M353 Polymyalgia rheumatica: Secondary | ICD-10-CM | POA: Diagnosis not present

## 2017-11-07 DIAGNOSIS — E1142 Type 2 diabetes mellitus with diabetic polyneuropathy: Secondary | ICD-10-CM | POA: Diagnosis not present

## 2017-11-07 DIAGNOSIS — M353 Polymyalgia rheumatica: Secondary | ICD-10-CM | POA: Diagnosis not present

## 2017-11-07 DIAGNOSIS — L97111 Non-pressure chronic ulcer of right thigh limited to breakdown of skin: Secondary | ICD-10-CM | POA: Diagnosis not present

## 2017-11-07 DIAGNOSIS — L97521 Non-pressure chronic ulcer of other part of left foot limited to breakdown of skin: Secondary | ICD-10-CM | POA: Diagnosis not present

## 2017-11-07 DIAGNOSIS — E1122 Type 2 diabetes mellitus with diabetic chronic kidney disease: Secondary | ICD-10-CM | POA: Diagnosis not present

## 2017-11-07 DIAGNOSIS — L97221 Non-pressure chronic ulcer of left calf limited to breakdown of skin: Secondary | ICD-10-CM | POA: Diagnosis not present

## 2017-11-08 DIAGNOSIS — L97111 Non-pressure chronic ulcer of right thigh limited to breakdown of skin: Secondary | ICD-10-CM | POA: Diagnosis not present

## 2017-11-08 DIAGNOSIS — E1142 Type 2 diabetes mellitus with diabetic polyneuropathy: Secondary | ICD-10-CM | POA: Diagnosis not present

## 2017-11-08 DIAGNOSIS — E1122 Type 2 diabetes mellitus with diabetic chronic kidney disease: Secondary | ICD-10-CM | POA: Diagnosis not present

## 2017-11-08 DIAGNOSIS — L97521 Non-pressure chronic ulcer of other part of left foot limited to breakdown of skin: Secondary | ICD-10-CM | POA: Diagnosis not present

## 2017-11-08 DIAGNOSIS — M353 Polymyalgia rheumatica: Secondary | ICD-10-CM | POA: Diagnosis not present

## 2017-11-08 DIAGNOSIS — L97221 Non-pressure chronic ulcer of left calf limited to breakdown of skin: Secondary | ICD-10-CM | POA: Diagnosis not present

## 2017-11-09 DIAGNOSIS — E1122 Type 2 diabetes mellitus with diabetic chronic kidney disease: Secondary | ICD-10-CM | POA: Diagnosis not present

## 2017-11-09 DIAGNOSIS — L97111 Non-pressure chronic ulcer of right thigh limited to breakdown of skin: Secondary | ICD-10-CM | POA: Diagnosis not present

## 2017-11-09 DIAGNOSIS — E1142 Type 2 diabetes mellitus with diabetic polyneuropathy: Secondary | ICD-10-CM | POA: Diagnosis not present

## 2017-11-09 DIAGNOSIS — L97521 Non-pressure chronic ulcer of other part of left foot limited to breakdown of skin: Secondary | ICD-10-CM | POA: Diagnosis not present

## 2017-11-09 DIAGNOSIS — L97221 Non-pressure chronic ulcer of left calf limited to breakdown of skin: Secondary | ICD-10-CM | POA: Diagnosis not present

## 2017-11-09 DIAGNOSIS — M353 Polymyalgia rheumatica: Secondary | ICD-10-CM | POA: Diagnosis not present

## 2017-11-10 DIAGNOSIS — S81802D Unspecified open wound, left lower leg, subsequent encounter: Secondary | ICD-10-CM | POA: Diagnosis not present

## 2017-11-10 DIAGNOSIS — S81801A Unspecified open wound, right lower leg, initial encounter: Secondary | ICD-10-CM | POA: Diagnosis not present

## 2017-11-10 DIAGNOSIS — S81802A Unspecified open wound, left lower leg, initial encounter: Secondary | ICD-10-CM | POA: Diagnosis not present

## 2017-11-13 DIAGNOSIS — L97521 Non-pressure chronic ulcer of other part of left foot limited to breakdown of skin: Secondary | ICD-10-CM | POA: Diagnosis not present

## 2017-11-13 DIAGNOSIS — E1142 Type 2 diabetes mellitus with diabetic polyneuropathy: Secondary | ICD-10-CM | POA: Diagnosis not present

## 2017-11-13 DIAGNOSIS — M353 Polymyalgia rheumatica: Secondary | ICD-10-CM | POA: Diagnosis not present

## 2017-11-13 DIAGNOSIS — E1122 Type 2 diabetes mellitus with diabetic chronic kidney disease: Secondary | ICD-10-CM | POA: Diagnosis not present

## 2017-11-13 DIAGNOSIS — L97111 Non-pressure chronic ulcer of right thigh limited to breakdown of skin: Secondary | ICD-10-CM | POA: Diagnosis not present

## 2017-11-13 DIAGNOSIS — L97221 Non-pressure chronic ulcer of left calf limited to breakdown of skin: Secondary | ICD-10-CM | POA: Diagnosis not present

## 2017-11-14 DIAGNOSIS — L97111 Non-pressure chronic ulcer of right thigh limited to breakdown of skin: Secondary | ICD-10-CM | POA: Diagnosis not present

## 2017-11-14 DIAGNOSIS — L97221 Non-pressure chronic ulcer of left calf limited to breakdown of skin: Secondary | ICD-10-CM | POA: Diagnosis not present

## 2017-11-14 DIAGNOSIS — L97521 Non-pressure chronic ulcer of other part of left foot limited to breakdown of skin: Secondary | ICD-10-CM | POA: Diagnosis not present

## 2017-11-14 DIAGNOSIS — E1142 Type 2 diabetes mellitus with diabetic polyneuropathy: Secondary | ICD-10-CM | POA: Diagnosis not present

## 2017-11-14 DIAGNOSIS — M353 Polymyalgia rheumatica: Secondary | ICD-10-CM | POA: Diagnosis not present

## 2017-11-14 DIAGNOSIS — E1122 Type 2 diabetes mellitus with diabetic chronic kidney disease: Secondary | ICD-10-CM | POA: Diagnosis not present

## 2017-11-16 DIAGNOSIS — L97221 Non-pressure chronic ulcer of left calf limited to breakdown of skin: Secondary | ICD-10-CM | POA: Diagnosis not present

## 2017-11-16 DIAGNOSIS — E1142 Type 2 diabetes mellitus with diabetic polyneuropathy: Secondary | ICD-10-CM | POA: Diagnosis not present

## 2017-11-16 DIAGNOSIS — L97111 Non-pressure chronic ulcer of right thigh limited to breakdown of skin: Secondary | ICD-10-CM | POA: Diagnosis not present

## 2017-11-16 DIAGNOSIS — E1122 Type 2 diabetes mellitus with diabetic chronic kidney disease: Secondary | ICD-10-CM | POA: Diagnosis not present

## 2017-11-16 DIAGNOSIS — M353 Polymyalgia rheumatica: Secondary | ICD-10-CM | POA: Diagnosis not present

## 2017-11-16 DIAGNOSIS — L97521 Non-pressure chronic ulcer of other part of left foot limited to breakdown of skin: Secondary | ICD-10-CM | POA: Diagnosis not present

## 2017-11-21 DIAGNOSIS — L97111 Non-pressure chronic ulcer of right thigh limited to breakdown of skin: Secondary | ICD-10-CM | POA: Diagnosis not present

## 2017-11-21 DIAGNOSIS — E1142 Type 2 diabetes mellitus with diabetic polyneuropathy: Secondary | ICD-10-CM | POA: Diagnosis not present

## 2017-11-21 DIAGNOSIS — E1122 Type 2 diabetes mellitus with diabetic chronic kidney disease: Secondary | ICD-10-CM | POA: Diagnosis not present

## 2017-11-21 DIAGNOSIS — L97221 Non-pressure chronic ulcer of left calf limited to breakdown of skin: Secondary | ICD-10-CM | POA: Diagnosis not present

## 2017-11-21 DIAGNOSIS — L97521 Non-pressure chronic ulcer of other part of left foot limited to breakdown of skin: Secondary | ICD-10-CM | POA: Diagnosis not present

## 2017-11-21 DIAGNOSIS — M353 Polymyalgia rheumatica: Secondary | ICD-10-CM | POA: Diagnosis not present

## 2017-11-23 DIAGNOSIS — E1142 Type 2 diabetes mellitus with diabetic polyneuropathy: Secondary | ICD-10-CM | POA: Diagnosis not present

## 2017-11-23 DIAGNOSIS — L97521 Non-pressure chronic ulcer of other part of left foot limited to breakdown of skin: Secondary | ICD-10-CM | POA: Diagnosis not present

## 2017-11-23 DIAGNOSIS — E1122 Type 2 diabetes mellitus with diabetic chronic kidney disease: Secondary | ICD-10-CM | POA: Diagnosis not present

## 2017-11-23 DIAGNOSIS — L97221 Non-pressure chronic ulcer of left calf limited to breakdown of skin: Secondary | ICD-10-CM | POA: Diagnosis not present

## 2017-11-23 DIAGNOSIS — M353 Polymyalgia rheumatica: Secondary | ICD-10-CM | POA: Diagnosis not present

## 2017-11-23 DIAGNOSIS — L97111 Non-pressure chronic ulcer of right thigh limited to breakdown of skin: Secondary | ICD-10-CM | POA: Diagnosis not present

## 2017-11-27 DIAGNOSIS — M353 Polymyalgia rheumatica: Secondary | ICD-10-CM | POA: Diagnosis not present

## 2017-11-27 DIAGNOSIS — L97221 Non-pressure chronic ulcer of left calf limited to breakdown of skin: Secondary | ICD-10-CM | POA: Diagnosis not present

## 2017-11-27 DIAGNOSIS — L97521 Non-pressure chronic ulcer of other part of left foot limited to breakdown of skin: Secondary | ICD-10-CM | POA: Diagnosis not present

## 2017-11-27 DIAGNOSIS — E1142 Type 2 diabetes mellitus with diabetic polyneuropathy: Secondary | ICD-10-CM | POA: Diagnosis not present

## 2017-11-27 DIAGNOSIS — L97111 Non-pressure chronic ulcer of right thigh limited to breakdown of skin: Secondary | ICD-10-CM | POA: Diagnosis not present

## 2017-11-27 DIAGNOSIS — E1122 Type 2 diabetes mellitus with diabetic chronic kidney disease: Secondary | ICD-10-CM | POA: Diagnosis not present

## 2017-11-28 DIAGNOSIS — L97521 Non-pressure chronic ulcer of other part of left foot limited to breakdown of skin: Secondary | ICD-10-CM | POA: Diagnosis not present

## 2017-11-28 DIAGNOSIS — L97111 Non-pressure chronic ulcer of right thigh limited to breakdown of skin: Secondary | ICD-10-CM | POA: Diagnosis not present

## 2017-11-28 DIAGNOSIS — L97221 Non-pressure chronic ulcer of left calf limited to breakdown of skin: Secondary | ICD-10-CM | POA: Diagnosis not present

## 2017-11-28 DIAGNOSIS — E1142 Type 2 diabetes mellitus with diabetic polyneuropathy: Secondary | ICD-10-CM | POA: Diagnosis not present

## 2017-11-28 DIAGNOSIS — M353 Polymyalgia rheumatica: Secondary | ICD-10-CM | POA: Diagnosis not present

## 2017-11-28 DIAGNOSIS — E1122 Type 2 diabetes mellitus with diabetic chronic kidney disease: Secondary | ICD-10-CM | POA: Diagnosis not present

## 2017-11-30 DIAGNOSIS — E1122 Type 2 diabetes mellitus with diabetic chronic kidney disease: Secondary | ICD-10-CM | POA: Diagnosis not present

## 2017-11-30 DIAGNOSIS — L97111 Non-pressure chronic ulcer of right thigh limited to breakdown of skin: Secondary | ICD-10-CM | POA: Diagnosis not present

## 2017-11-30 DIAGNOSIS — L97221 Non-pressure chronic ulcer of left calf limited to breakdown of skin: Secondary | ICD-10-CM | POA: Diagnosis not present

## 2017-11-30 DIAGNOSIS — M353 Polymyalgia rheumatica: Secondary | ICD-10-CM | POA: Diagnosis not present

## 2017-11-30 DIAGNOSIS — E1142 Type 2 diabetes mellitus with diabetic polyneuropathy: Secondary | ICD-10-CM | POA: Diagnosis not present

## 2017-11-30 DIAGNOSIS — L97521 Non-pressure chronic ulcer of other part of left foot limited to breakdown of skin: Secondary | ICD-10-CM | POA: Diagnosis not present

## 2017-12-01 DIAGNOSIS — E1142 Type 2 diabetes mellitus with diabetic polyneuropathy: Secondary | ICD-10-CM | POA: Diagnosis not present

## 2017-12-01 DIAGNOSIS — M353 Polymyalgia rheumatica: Secondary | ICD-10-CM | POA: Diagnosis not present

## 2017-12-01 DIAGNOSIS — L97221 Non-pressure chronic ulcer of left calf limited to breakdown of skin: Secondary | ICD-10-CM | POA: Diagnosis not present

## 2017-12-01 DIAGNOSIS — E1122 Type 2 diabetes mellitus with diabetic chronic kidney disease: Secondary | ICD-10-CM | POA: Diagnosis not present

## 2017-12-01 DIAGNOSIS — L97521 Non-pressure chronic ulcer of other part of left foot limited to breakdown of skin: Secondary | ICD-10-CM | POA: Diagnosis not present

## 2017-12-01 DIAGNOSIS — L97111 Non-pressure chronic ulcer of right thigh limited to breakdown of skin: Secondary | ICD-10-CM | POA: Diagnosis not present

## 2017-12-04 DIAGNOSIS — L97521 Non-pressure chronic ulcer of other part of left foot limited to breakdown of skin: Secondary | ICD-10-CM | POA: Diagnosis not present

## 2017-12-04 DIAGNOSIS — Z13220 Encounter for screening for lipoid disorders: Secondary | ICD-10-CM | POA: Diagnosis not present

## 2017-12-04 DIAGNOSIS — Z23 Encounter for immunization: Secondary | ICD-10-CM | POA: Diagnosis not present

## 2017-12-04 DIAGNOSIS — R5383 Other fatigue: Secondary | ICD-10-CM | POA: Diagnosis not present

## 2017-12-04 DIAGNOSIS — Z6823 Body mass index (BMI) 23.0-23.9, adult: Secondary | ICD-10-CM | POA: Diagnosis not present

## 2017-12-04 DIAGNOSIS — E1122 Type 2 diabetes mellitus with diabetic chronic kidney disease: Secondary | ICD-10-CM | POA: Diagnosis not present

## 2017-12-04 DIAGNOSIS — E782 Mixed hyperlipidemia: Secondary | ICD-10-CM | POA: Diagnosis not present

## 2017-12-04 DIAGNOSIS — L97111 Non-pressure chronic ulcer of right thigh limited to breakdown of skin: Secondary | ICD-10-CM | POA: Diagnosis not present

## 2017-12-04 DIAGNOSIS — M7500 Adhesive capsulitis of unspecified shoulder: Secondary | ICD-10-CM | POA: Diagnosis not present

## 2017-12-04 DIAGNOSIS — E1142 Type 2 diabetes mellitus with diabetic polyneuropathy: Secondary | ICD-10-CM | POA: Diagnosis not present

## 2017-12-04 DIAGNOSIS — L97221 Non-pressure chronic ulcer of left calf limited to breakdown of skin: Secondary | ICD-10-CM | POA: Diagnosis not present

## 2017-12-04 DIAGNOSIS — M353 Polymyalgia rheumatica: Secondary | ICD-10-CM | POA: Diagnosis not present

## 2017-12-05 DIAGNOSIS — L97111 Non-pressure chronic ulcer of right thigh limited to breakdown of skin: Secondary | ICD-10-CM | POA: Diagnosis not present

## 2017-12-05 DIAGNOSIS — M353 Polymyalgia rheumatica: Secondary | ICD-10-CM | POA: Diagnosis not present

## 2017-12-05 DIAGNOSIS — L97221 Non-pressure chronic ulcer of left calf limited to breakdown of skin: Secondary | ICD-10-CM | POA: Diagnosis not present

## 2017-12-05 DIAGNOSIS — E1142 Type 2 diabetes mellitus with diabetic polyneuropathy: Secondary | ICD-10-CM | POA: Diagnosis not present

## 2017-12-05 DIAGNOSIS — L97521 Non-pressure chronic ulcer of other part of left foot limited to breakdown of skin: Secondary | ICD-10-CM | POA: Diagnosis not present

## 2017-12-05 DIAGNOSIS — E1122 Type 2 diabetes mellitus with diabetic chronic kidney disease: Secondary | ICD-10-CM | POA: Diagnosis not present

## 2017-12-06 DIAGNOSIS — L97521 Non-pressure chronic ulcer of other part of left foot limited to breakdown of skin: Secondary | ICD-10-CM | POA: Diagnosis not present

## 2017-12-06 DIAGNOSIS — E1122 Type 2 diabetes mellitus with diabetic chronic kidney disease: Secondary | ICD-10-CM | POA: Diagnosis not present

## 2017-12-06 DIAGNOSIS — M353 Polymyalgia rheumatica: Secondary | ICD-10-CM | POA: Diagnosis not present

## 2017-12-06 DIAGNOSIS — E1142 Type 2 diabetes mellitus with diabetic polyneuropathy: Secondary | ICD-10-CM | POA: Diagnosis not present

## 2017-12-06 DIAGNOSIS — L97111 Non-pressure chronic ulcer of right thigh limited to breakdown of skin: Secondary | ICD-10-CM | POA: Diagnosis not present

## 2017-12-06 DIAGNOSIS — L97221 Non-pressure chronic ulcer of left calf limited to breakdown of skin: Secondary | ICD-10-CM | POA: Diagnosis not present

## 2017-12-07 DIAGNOSIS — L97221 Non-pressure chronic ulcer of left calf limited to breakdown of skin: Secondary | ICD-10-CM | POA: Diagnosis not present

## 2017-12-07 DIAGNOSIS — L97521 Non-pressure chronic ulcer of other part of left foot limited to breakdown of skin: Secondary | ICD-10-CM | POA: Diagnosis not present

## 2017-12-07 DIAGNOSIS — E1122 Type 2 diabetes mellitus with diabetic chronic kidney disease: Secondary | ICD-10-CM | POA: Diagnosis not present

## 2017-12-07 DIAGNOSIS — L97111 Non-pressure chronic ulcer of right thigh limited to breakdown of skin: Secondary | ICD-10-CM | POA: Diagnosis not present

## 2017-12-07 DIAGNOSIS — E1142 Type 2 diabetes mellitus with diabetic polyneuropathy: Secondary | ICD-10-CM | POA: Diagnosis not present

## 2017-12-07 DIAGNOSIS — M353 Polymyalgia rheumatica: Secondary | ICD-10-CM | POA: Diagnosis not present

## 2017-12-08 DIAGNOSIS — E1122 Type 2 diabetes mellitus with diabetic chronic kidney disease: Secondary | ICD-10-CM | POA: Diagnosis not present

## 2017-12-08 DIAGNOSIS — S81801A Unspecified open wound, right lower leg, initial encounter: Secondary | ICD-10-CM | POA: Diagnosis not present

## 2017-12-08 DIAGNOSIS — S81802D Unspecified open wound, left lower leg, subsequent encounter: Secondary | ICD-10-CM | POA: Diagnosis not present

## 2017-12-08 DIAGNOSIS — L97521 Non-pressure chronic ulcer of other part of left foot limited to breakdown of skin: Secondary | ICD-10-CM | POA: Diagnosis not present

## 2017-12-08 DIAGNOSIS — E1142 Type 2 diabetes mellitus with diabetic polyneuropathy: Secondary | ICD-10-CM | POA: Diagnosis not present

## 2017-12-08 DIAGNOSIS — M353 Polymyalgia rheumatica: Secondary | ICD-10-CM | POA: Diagnosis not present

## 2017-12-08 DIAGNOSIS — L97221 Non-pressure chronic ulcer of left calf limited to breakdown of skin: Secondary | ICD-10-CM | POA: Diagnosis not present

## 2017-12-08 DIAGNOSIS — S81802A Unspecified open wound, left lower leg, initial encounter: Secondary | ICD-10-CM | POA: Diagnosis not present

## 2017-12-08 DIAGNOSIS — L97111 Non-pressure chronic ulcer of right thigh limited to breakdown of skin: Secondary | ICD-10-CM | POA: Diagnosis not present

## 2017-12-11 DIAGNOSIS — E1122 Type 2 diabetes mellitus with diabetic chronic kidney disease: Secondary | ICD-10-CM | POA: Diagnosis not present

## 2017-12-11 DIAGNOSIS — L97521 Non-pressure chronic ulcer of other part of left foot limited to breakdown of skin: Secondary | ICD-10-CM | POA: Diagnosis not present

## 2017-12-11 DIAGNOSIS — M353 Polymyalgia rheumatica: Secondary | ICD-10-CM | POA: Diagnosis not present

## 2017-12-11 DIAGNOSIS — E1142 Type 2 diabetes mellitus with diabetic polyneuropathy: Secondary | ICD-10-CM | POA: Diagnosis not present

## 2017-12-11 DIAGNOSIS — L97221 Non-pressure chronic ulcer of left calf limited to breakdown of skin: Secondary | ICD-10-CM | POA: Diagnosis not present

## 2017-12-11 DIAGNOSIS — L97111 Non-pressure chronic ulcer of right thigh limited to breakdown of skin: Secondary | ICD-10-CM | POA: Diagnosis not present

## 2017-12-12 DIAGNOSIS — E1122 Type 2 diabetes mellitus with diabetic chronic kidney disease: Secondary | ICD-10-CM | POA: Diagnosis not present

## 2017-12-12 DIAGNOSIS — E1142 Type 2 diabetes mellitus with diabetic polyneuropathy: Secondary | ICD-10-CM | POA: Diagnosis not present

## 2017-12-12 DIAGNOSIS — M353 Polymyalgia rheumatica: Secondary | ICD-10-CM | POA: Diagnosis not present

## 2017-12-12 DIAGNOSIS — L97221 Non-pressure chronic ulcer of left calf limited to breakdown of skin: Secondary | ICD-10-CM | POA: Diagnosis not present

## 2017-12-12 DIAGNOSIS — L97111 Non-pressure chronic ulcer of right thigh limited to breakdown of skin: Secondary | ICD-10-CM | POA: Diagnosis not present

## 2017-12-12 DIAGNOSIS — L97521 Non-pressure chronic ulcer of other part of left foot limited to breakdown of skin: Secondary | ICD-10-CM | POA: Diagnosis not present

## 2017-12-13 DIAGNOSIS — E1122 Type 2 diabetes mellitus with diabetic chronic kidney disease: Secondary | ICD-10-CM | POA: Diagnosis not present

## 2017-12-13 DIAGNOSIS — L97521 Non-pressure chronic ulcer of other part of left foot limited to breakdown of skin: Secondary | ICD-10-CM | POA: Diagnosis not present

## 2017-12-13 DIAGNOSIS — L97111 Non-pressure chronic ulcer of right thigh limited to breakdown of skin: Secondary | ICD-10-CM | POA: Diagnosis not present

## 2017-12-13 DIAGNOSIS — M353 Polymyalgia rheumatica: Secondary | ICD-10-CM | POA: Diagnosis not present

## 2017-12-13 DIAGNOSIS — E1142 Type 2 diabetes mellitus with diabetic polyneuropathy: Secondary | ICD-10-CM | POA: Diagnosis not present

## 2017-12-13 DIAGNOSIS — L97221 Non-pressure chronic ulcer of left calf limited to breakdown of skin: Secondary | ICD-10-CM | POA: Diagnosis not present

## 2017-12-14 DIAGNOSIS — L97521 Non-pressure chronic ulcer of other part of left foot limited to breakdown of skin: Secondary | ICD-10-CM | POA: Diagnosis not present

## 2017-12-14 DIAGNOSIS — E1122 Type 2 diabetes mellitus with diabetic chronic kidney disease: Secondary | ICD-10-CM | POA: Diagnosis not present

## 2017-12-14 DIAGNOSIS — L97221 Non-pressure chronic ulcer of left calf limited to breakdown of skin: Secondary | ICD-10-CM | POA: Diagnosis not present

## 2017-12-14 DIAGNOSIS — E1142 Type 2 diabetes mellitus with diabetic polyneuropathy: Secondary | ICD-10-CM | POA: Diagnosis not present

## 2017-12-14 DIAGNOSIS — M353 Polymyalgia rheumatica: Secondary | ICD-10-CM | POA: Diagnosis not present

## 2017-12-14 DIAGNOSIS — L97111 Non-pressure chronic ulcer of right thigh limited to breakdown of skin: Secondary | ICD-10-CM | POA: Diagnosis not present

## 2017-12-19 DIAGNOSIS — L97111 Non-pressure chronic ulcer of right thigh limited to breakdown of skin: Secondary | ICD-10-CM | POA: Diagnosis not present

## 2017-12-19 DIAGNOSIS — L97521 Non-pressure chronic ulcer of other part of left foot limited to breakdown of skin: Secondary | ICD-10-CM | POA: Diagnosis not present

## 2017-12-19 DIAGNOSIS — E1122 Type 2 diabetes mellitus with diabetic chronic kidney disease: Secondary | ICD-10-CM | POA: Diagnosis not present

## 2017-12-19 DIAGNOSIS — M353 Polymyalgia rheumatica: Secondary | ICD-10-CM | POA: Diagnosis not present

## 2017-12-19 DIAGNOSIS — L97221 Non-pressure chronic ulcer of left calf limited to breakdown of skin: Secondary | ICD-10-CM | POA: Diagnosis not present

## 2017-12-19 DIAGNOSIS — E1142 Type 2 diabetes mellitus with diabetic polyneuropathy: Secondary | ICD-10-CM | POA: Diagnosis not present

## 2017-12-21 DIAGNOSIS — L97111 Non-pressure chronic ulcer of right thigh limited to breakdown of skin: Secondary | ICD-10-CM | POA: Diagnosis not present

## 2017-12-21 DIAGNOSIS — L97221 Non-pressure chronic ulcer of left calf limited to breakdown of skin: Secondary | ICD-10-CM | POA: Diagnosis not present

## 2017-12-21 DIAGNOSIS — L97521 Non-pressure chronic ulcer of other part of left foot limited to breakdown of skin: Secondary | ICD-10-CM | POA: Diagnosis not present

## 2017-12-21 DIAGNOSIS — E1122 Type 2 diabetes mellitus with diabetic chronic kidney disease: Secondary | ICD-10-CM | POA: Diagnosis not present

## 2017-12-21 DIAGNOSIS — M353 Polymyalgia rheumatica: Secondary | ICD-10-CM | POA: Diagnosis not present

## 2017-12-21 DIAGNOSIS — E1142 Type 2 diabetes mellitus with diabetic polyneuropathy: Secondary | ICD-10-CM | POA: Diagnosis not present

## 2017-12-25 DIAGNOSIS — E1142 Type 2 diabetes mellitus with diabetic polyneuropathy: Secondary | ICD-10-CM | POA: Diagnosis not present

## 2017-12-25 DIAGNOSIS — E1122 Type 2 diabetes mellitus with diabetic chronic kidney disease: Secondary | ICD-10-CM | POA: Diagnosis not present

## 2017-12-25 DIAGNOSIS — L97221 Non-pressure chronic ulcer of left calf limited to breakdown of skin: Secondary | ICD-10-CM | POA: Diagnosis not present

## 2017-12-25 DIAGNOSIS — L97111 Non-pressure chronic ulcer of right thigh limited to breakdown of skin: Secondary | ICD-10-CM | POA: Diagnosis not present

## 2017-12-25 DIAGNOSIS — M353 Polymyalgia rheumatica: Secondary | ICD-10-CM | POA: Diagnosis not present

## 2017-12-25 DIAGNOSIS — L97521 Non-pressure chronic ulcer of other part of left foot limited to breakdown of skin: Secondary | ICD-10-CM | POA: Diagnosis not present

## 2017-12-26 DIAGNOSIS — E1142 Type 2 diabetes mellitus with diabetic polyneuropathy: Secondary | ICD-10-CM | POA: Diagnosis not present

## 2017-12-26 DIAGNOSIS — L97521 Non-pressure chronic ulcer of other part of left foot limited to breakdown of skin: Secondary | ICD-10-CM | POA: Diagnosis not present

## 2017-12-26 DIAGNOSIS — L97111 Non-pressure chronic ulcer of right thigh limited to breakdown of skin: Secondary | ICD-10-CM | POA: Diagnosis not present

## 2017-12-26 DIAGNOSIS — M353 Polymyalgia rheumatica: Secondary | ICD-10-CM | POA: Diagnosis not present

## 2017-12-26 DIAGNOSIS — L97221 Non-pressure chronic ulcer of left calf limited to breakdown of skin: Secondary | ICD-10-CM | POA: Diagnosis not present

## 2017-12-26 DIAGNOSIS — E1122 Type 2 diabetes mellitus with diabetic chronic kidney disease: Secondary | ICD-10-CM | POA: Diagnosis not present

## 2017-12-27 DIAGNOSIS — E1142 Type 2 diabetes mellitus with diabetic polyneuropathy: Secondary | ICD-10-CM | POA: Diagnosis not present

## 2017-12-27 DIAGNOSIS — L97221 Non-pressure chronic ulcer of left calf limited to breakdown of skin: Secondary | ICD-10-CM | POA: Diagnosis not present

## 2017-12-27 DIAGNOSIS — L97111 Non-pressure chronic ulcer of right thigh limited to breakdown of skin: Secondary | ICD-10-CM | POA: Diagnosis not present

## 2017-12-27 DIAGNOSIS — E1122 Type 2 diabetes mellitus with diabetic chronic kidney disease: Secondary | ICD-10-CM | POA: Diagnosis not present

## 2017-12-27 DIAGNOSIS — M353 Polymyalgia rheumatica: Secondary | ICD-10-CM | POA: Diagnosis not present

## 2017-12-27 DIAGNOSIS — L97521 Non-pressure chronic ulcer of other part of left foot limited to breakdown of skin: Secondary | ICD-10-CM | POA: Diagnosis not present

## 2018-01-12 DIAGNOSIS — S81802A Unspecified open wound, left lower leg, initial encounter: Secondary | ICD-10-CM | POA: Diagnosis not present

## 2018-01-12 DIAGNOSIS — L929 Granulomatous disorder of the skin and subcutaneous tissue, unspecified: Secondary | ICD-10-CM | POA: Diagnosis not present

## 2018-01-23 DIAGNOSIS — L929 Granulomatous disorder of the skin and subcutaneous tissue, unspecified: Secondary | ICD-10-CM | POA: Diagnosis not present

## 2018-01-23 DIAGNOSIS — L928 Other granulomatous disorders of the skin and subcutaneous tissue: Secondary | ICD-10-CM | POA: Diagnosis not present

## 2018-03-14 ENCOUNTER — Telehealth: Payer: Self-pay | Admitting: Cardiology

## 2018-03-14 NOTE — Telephone Encounter (Signed)
Numerous attempts to contact patient with recall letters. Unable to reach by telephone. with no success.    12/30/2016 12:41 PM New [10]    [System] 03/31/2017 11:03 PM Notification Sent [20]   Don Hall [[id number]] 09/20/2017 10:51 AM Notification Sent [20]   Don Hall [[id number]] 01/22/2018 1:55 PM Notification Sent [20]   Don Hall [[id number]] 03/14/2018 1:23 PM Notification Sent [20]

## 2018-03-20 DIAGNOSIS — E782 Mixed hyperlipidemia: Secondary | ICD-10-CM | POA: Diagnosis not present

## 2018-03-20 DIAGNOSIS — R5383 Other fatigue: Secondary | ICD-10-CM | POA: Diagnosis not present

## 2018-03-20 DIAGNOSIS — I10 Essential (primary) hypertension: Secondary | ICD-10-CM | POA: Diagnosis not present

## 2018-03-20 DIAGNOSIS — K210 Gastro-esophageal reflux disease with esophagitis: Secondary | ICD-10-CM | POA: Diagnosis not present

## 2018-03-20 DIAGNOSIS — E1142 Type 2 diabetes mellitus with diabetic polyneuropathy: Secondary | ICD-10-CM | POA: Diagnosis not present

## 2018-03-20 DIAGNOSIS — E1122 Type 2 diabetes mellitus with diabetic chronic kidney disease: Secondary | ICD-10-CM | POA: Diagnosis not present

## 2018-03-20 DIAGNOSIS — E1165 Type 2 diabetes mellitus with hyperglycemia: Secondary | ICD-10-CM | POA: Diagnosis not present

## 2018-03-20 DIAGNOSIS — N183 Chronic kidney disease, stage 3 (moderate): Secondary | ICD-10-CM | POA: Diagnosis not present

## 2018-03-23 DIAGNOSIS — M79671 Pain in right foot: Secondary | ICD-10-CM | POA: Diagnosis not present

## 2018-03-23 DIAGNOSIS — Z6824 Body mass index (BMI) 24.0-24.9, adult: Secondary | ICD-10-CM | POA: Diagnosis not present

## 2018-03-23 DIAGNOSIS — Z0001 Encounter for general adult medical examination with abnormal findings: Secondary | ICD-10-CM | POA: Diagnosis not present

## 2018-03-23 DIAGNOSIS — I10 Essential (primary) hypertension: Secondary | ICD-10-CM | POA: Diagnosis not present

## 2018-05-03 ENCOUNTER — Encounter: Payer: Self-pay | Admitting: Internal Medicine

## 2018-05-13 ENCOUNTER — Inpatient Hospital Stay
Admission: AD | Admit: 2018-05-13 | Payer: Self-pay | Source: Other Acute Inpatient Hospital | Admitting: Internal Medicine

## 2018-05-13 DIAGNOSIS — Z86718 Personal history of other venous thrombosis and embolism: Secondary | ICD-10-CM | POA: Diagnosis not present

## 2018-05-13 DIAGNOSIS — N132 Hydronephrosis with renal and ureteral calculous obstruction: Secondary | ICD-10-CM | POA: Diagnosis not present

## 2018-05-13 DIAGNOSIS — R1031 Right lower quadrant pain: Secondary | ICD-10-CM | POA: Diagnosis not present

## 2018-05-13 DIAGNOSIS — Z87891 Personal history of nicotine dependence: Secondary | ICD-10-CM | POA: Diagnosis not present

## 2018-05-13 DIAGNOSIS — N201 Calculus of ureter: Secondary | ICD-10-CM | POA: Diagnosis not present

## 2018-05-13 DIAGNOSIS — Z833 Family history of diabetes mellitus: Secondary | ICD-10-CM | POA: Diagnosis not present

## 2018-05-13 DIAGNOSIS — N200 Calculus of kidney: Secondary | ICD-10-CM | POA: Diagnosis not present

## 2018-05-13 DIAGNOSIS — E119 Type 2 diabetes mellitus without complications: Secondary | ICD-10-CM | POA: Diagnosis not present

## 2018-05-13 DIAGNOSIS — K4020 Bilateral inguinal hernia, without obstruction or gangrene, not specified as recurrent: Secondary | ICD-10-CM | POA: Diagnosis not present

## 2018-05-13 DIAGNOSIS — R112 Nausea with vomiting, unspecified: Secondary | ICD-10-CM | POA: Diagnosis not present

## 2018-05-13 DIAGNOSIS — Z7901 Long term (current) use of anticoagulants: Secondary | ICD-10-CM | POA: Diagnosis not present

## 2018-05-13 DIAGNOSIS — I700 Atherosclerosis of aorta: Secondary | ICD-10-CM | POA: Diagnosis not present

## 2018-05-13 DIAGNOSIS — Z8673 Personal history of transient ischemic attack (TIA), and cerebral infarction without residual deficits: Secondary | ICD-10-CM | POA: Diagnosis not present

## 2018-05-13 DIAGNOSIS — I4891 Unspecified atrial fibrillation: Secondary | ICD-10-CM | POA: Diagnosis not present

## 2018-05-14 DIAGNOSIS — N183 Chronic kidney disease, stage 3 (moderate): Secondary | ICD-10-CM | POA: Diagnosis not present

## 2018-05-14 DIAGNOSIS — N201 Calculus of ureter: Secondary | ICD-10-CM | POA: Diagnosis not present

## 2018-05-14 DIAGNOSIS — I129 Hypertensive chronic kidney disease with stage 1 through stage 4 chronic kidney disease, or unspecified chronic kidney disease: Secondary | ICD-10-CM | POA: Diagnosis not present

## 2018-05-14 DIAGNOSIS — R109 Unspecified abdominal pain: Secondary | ICD-10-CM | POA: Diagnosis not present

## 2018-05-14 DIAGNOSIS — N179 Acute kidney failure, unspecified: Secondary | ICD-10-CM | POA: Diagnosis not present

## 2018-05-14 DIAGNOSIS — E1122 Type 2 diabetes mellitus with diabetic chronic kidney disease: Secondary | ICD-10-CM | POA: Diagnosis not present

## 2018-05-14 DIAGNOSIS — N132 Hydronephrosis with renal and ureteral calculous obstruction: Secondary | ICD-10-CM | POA: Diagnosis not present

## 2018-05-16 NOTE — Progress Notes (Signed)
A user error has taken place.

## 2018-05-29 DIAGNOSIS — N201 Calculus of ureter: Secondary | ICD-10-CM | POA: Diagnosis not present

## 2018-06-02 DIAGNOSIS — Z7901 Long term (current) use of anticoagulants: Secondary | ICD-10-CM | POA: Diagnosis not present

## 2018-06-02 DIAGNOSIS — R58 Hemorrhage, not elsewhere classified: Secondary | ICD-10-CM | POA: Diagnosis not present

## 2018-06-02 DIAGNOSIS — Z96653 Presence of artificial knee joint, bilateral: Secondary | ICD-10-CM | POA: Diagnosis not present

## 2018-06-02 DIAGNOSIS — S0083XA Contusion of other part of head, initial encounter: Secondary | ICD-10-CM | POA: Diagnosis not present

## 2018-06-02 DIAGNOSIS — S61412A Laceration without foreign body of left hand, initial encounter: Secondary | ICD-10-CM | POA: Diagnosis not present

## 2018-06-02 DIAGNOSIS — Z872 Personal history of diseases of the skin and subcutaneous tissue: Secondary | ICD-10-CM | POA: Diagnosis not present

## 2018-06-02 DIAGNOSIS — W19XXXA Unspecified fall, initial encounter: Secondary | ICD-10-CM | POA: Diagnosis not present

## 2018-06-02 DIAGNOSIS — R609 Edema, unspecified: Secondary | ICD-10-CM | POA: Diagnosis not present

## 2018-06-02 DIAGNOSIS — E785 Hyperlipidemia, unspecified: Secondary | ICD-10-CM | POA: Diagnosis not present

## 2018-06-02 DIAGNOSIS — I4891 Unspecified atrial fibrillation: Secondary | ICD-10-CM | POA: Diagnosis not present

## 2018-06-02 DIAGNOSIS — I10 Essential (primary) hypertension: Secondary | ICD-10-CM | POA: Diagnosis not present

## 2018-06-02 DIAGNOSIS — R51 Headache: Secondary | ICD-10-CM | POA: Diagnosis not present

## 2018-06-02 DIAGNOSIS — E119 Type 2 diabetes mellitus without complications: Secondary | ICD-10-CM | POA: Diagnosis not present

## 2018-06-02 DIAGNOSIS — S51811A Laceration without foreign body of right forearm, initial encounter: Secondary | ICD-10-CM | POA: Diagnosis not present

## 2018-06-02 DIAGNOSIS — S0990XA Unspecified injury of head, initial encounter: Secondary | ICD-10-CM | POA: Diagnosis not present

## 2018-06-02 DIAGNOSIS — Z87891 Personal history of nicotine dependence: Secondary | ICD-10-CM | POA: Diagnosis not present

## 2018-06-02 DIAGNOSIS — Z8673 Personal history of transient ischemic attack (TIA), and cerebral infarction without residual deficits: Secondary | ICD-10-CM | POA: Diagnosis not present

## 2018-06-02 DIAGNOSIS — S0101XA Laceration without foreign body of scalp, initial encounter: Secondary | ICD-10-CM | POA: Diagnosis not present

## 2018-06-02 DIAGNOSIS — Z86718 Personal history of other venous thrombosis and embolism: Secondary | ICD-10-CM | POA: Diagnosis not present

## 2018-06-02 DIAGNOSIS — M542 Cervicalgia: Secondary | ICD-10-CM | POA: Diagnosis not present

## 2018-06-02 DIAGNOSIS — W07XXXA Fall from chair, initial encounter: Secondary | ICD-10-CM | POA: Diagnosis not present

## 2018-06-11 DIAGNOSIS — Z6825 Body mass index (BMI) 25.0-25.9, adult: Secondary | ICD-10-CM | POA: Diagnosis not present

## 2018-06-11 DIAGNOSIS — S0181XA Laceration without foreign body of other part of head, initial encounter: Secondary | ICD-10-CM | POA: Diagnosis not present

## 2018-08-08 DIAGNOSIS — Z960 Presence of urogenital implants: Secondary | ICD-10-CM | POA: Diagnosis not present

## 2018-08-08 DIAGNOSIS — N200 Calculus of kidney: Secondary | ICD-10-CM | POA: Diagnosis not present

## 2018-08-08 DIAGNOSIS — N201 Calculus of ureter: Secondary | ICD-10-CM | POA: Diagnosis not present

## 2018-08-08 DIAGNOSIS — N210 Calculus in bladder: Secondary | ICD-10-CM | POA: Diagnosis not present

## 2018-08-17 DIAGNOSIS — D649 Anemia, unspecified: Secondary | ICD-10-CM | POA: Diagnosis not present

## 2018-08-17 DIAGNOSIS — E782 Mixed hyperlipidemia: Secondary | ICD-10-CM | POA: Diagnosis not present

## 2018-08-17 DIAGNOSIS — N183 Chronic kidney disease, stage 3 (moderate): Secondary | ICD-10-CM | POA: Diagnosis not present

## 2018-08-17 DIAGNOSIS — E875 Hyperkalemia: Secondary | ICD-10-CM | POA: Diagnosis not present

## 2018-08-17 DIAGNOSIS — E1122 Type 2 diabetes mellitus with diabetic chronic kidney disease: Secondary | ICD-10-CM | POA: Diagnosis not present

## 2018-08-17 DIAGNOSIS — R5383 Other fatigue: Secondary | ICD-10-CM | POA: Diagnosis not present

## 2018-08-17 DIAGNOSIS — K210 Gastro-esophageal reflux disease with esophagitis: Secondary | ICD-10-CM | POA: Diagnosis not present

## 2018-08-17 DIAGNOSIS — I10 Essential (primary) hypertension: Secondary | ICD-10-CM | POA: Diagnosis not present

## 2018-08-21 DIAGNOSIS — N201 Calculus of ureter: Secondary | ICD-10-CM | POA: Diagnosis not present

## 2018-08-21 DIAGNOSIS — Z1159 Encounter for screening for other viral diseases: Secondary | ICD-10-CM | POA: Diagnosis not present

## 2018-08-21 DIAGNOSIS — Z01812 Encounter for preprocedural laboratory examination: Secondary | ICD-10-CM | POA: Diagnosis not present

## 2018-08-27 DIAGNOSIS — D649 Anemia, unspecified: Secondary | ICD-10-CM | POA: Diagnosis not present

## 2018-08-27 DIAGNOSIS — Z87891 Personal history of nicotine dependence: Secondary | ICD-10-CM | POA: Diagnosis not present

## 2018-08-27 DIAGNOSIS — I129 Hypertensive chronic kidney disease with stage 1 through stage 4 chronic kidney disease, or unspecified chronic kidney disease: Secondary | ICD-10-CM | POA: Diagnosis not present

## 2018-08-27 DIAGNOSIS — R52 Pain, unspecified: Secondary | ICD-10-CM | POA: Diagnosis not present

## 2018-08-27 DIAGNOSIS — N183 Chronic kidney disease, stage 3 (moderate): Secondary | ICD-10-CM | POA: Diagnosis not present

## 2018-08-27 DIAGNOSIS — Z466 Encounter for fitting and adjustment of urinary device: Secondary | ICD-10-CM | POA: Diagnosis not present

## 2018-08-27 DIAGNOSIS — Z86718 Personal history of other venous thrombosis and embolism: Secondary | ICD-10-CM | POA: Diagnosis not present

## 2018-08-27 DIAGNOSIS — I4891 Unspecified atrial fibrillation: Secondary | ICD-10-CM | POA: Diagnosis not present

## 2018-08-27 DIAGNOSIS — E1122 Type 2 diabetes mellitus with diabetic chronic kidney disease: Secondary | ICD-10-CM | POA: Diagnosis not present

## 2018-08-27 DIAGNOSIS — N201 Calculus of ureter: Secondary | ICD-10-CM | POA: Diagnosis not present

## 2018-08-29 DIAGNOSIS — D649 Anemia, unspecified: Secondary | ICD-10-CM | POA: Diagnosis not present

## 2018-08-29 DIAGNOSIS — I10 Essential (primary) hypertension: Secondary | ICD-10-CM | POA: Diagnosis not present

## 2018-08-29 DIAGNOSIS — Z6825 Body mass index (BMI) 25.0-25.9, adult: Secondary | ICD-10-CM | POA: Diagnosis not present

## 2018-08-29 DIAGNOSIS — E782 Mixed hyperlipidemia: Secondary | ICD-10-CM | POA: Diagnosis not present

## 2018-08-29 DIAGNOSIS — E1122 Type 2 diabetes mellitus with diabetic chronic kidney disease: Secondary | ICD-10-CM | POA: Diagnosis not present

## 2018-08-29 DIAGNOSIS — D692 Other nonthrombocytopenic purpura: Secondary | ICD-10-CM | POA: Diagnosis not present

## 2018-08-29 DIAGNOSIS — E440 Moderate protein-calorie malnutrition: Secondary | ICD-10-CM | POA: Diagnosis not present

## 2018-08-29 DIAGNOSIS — E1142 Type 2 diabetes mellitus with diabetic polyneuropathy: Secondary | ICD-10-CM | POA: Diagnosis not present

## 2018-09-20 DIAGNOSIS — Z87442 Personal history of urinary calculi: Secondary | ICD-10-CM | POA: Diagnosis not present

## 2018-09-20 DIAGNOSIS — N201 Calculus of ureter: Secondary | ICD-10-CM | POA: Diagnosis not present

## 2018-09-20 DIAGNOSIS — Z960 Presence of urogenital implants: Secondary | ICD-10-CM | POA: Diagnosis not present

## 2018-09-20 DIAGNOSIS — N132 Hydronephrosis with renal and ureteral calculous obstruction: Secondary | ICD-10-CM | POA: Diagnosis not present

## 2018-09-20 DIAGNOSIS — Z466 Encounter for fitting and adjustment of urinary device: Secondary | ICD-10-CM | POA: Diagnosis not present

## 2018-09-20 DIAGNOSIS — N200 Calculus of kidney: Secondary | ICD-10-CM | POA: Diagnosis not present

## 2018-09-20 DIAGNOSIS — E1165 Type 2 diabetes mellitus with hyperglycemia: Secondary | ICD-10-CM | POA: Diagnosis not present

## 2018-10-02 DIAGNOSIS — Z87442 Personal history of urinary calculi: Secondary | ICD-10-CM | POA: Diagnosis not present

## 2018-10-02 DIAGNOSIS — N201 Calculus of ureter: Secondary | ICD-10-CM | POA: Diagnosis not present

## 2018-10-02 DIAGNOSIS — Z466 Encounter for fitting and adjustment of urinary device: Secondary | ICD-10-CM | POA: Diagnosis not present

## 2018-10-11 DIAGNOSIS — E1122 Type 2 diabetes mellitus with diabetic chronic kidney disease: Secondary | ICD-10-CM | POA: Diagnosis not present

## 2018-10-11 DIAGNOSIS — M353 Polymyalgia rheumatica: Secondary | ICD-10-CM | POA: Diagnosis not present

## 2018-10-11 DIAGNOSIS — I482 Chronic atrial fibrillation, unspecified: Secondary | ICD-10-CM | POA: Diagnosis not present

## 2018-10-11 DIAGNOSIS — R600 Localized edema: Secondary | ICD-10-CM | POA: Diagnosis not present

## 2018-10-11 DIAGNOSIS — Z6825 Body mass index (BMI) 25.0-25.9, adult: Secondary | ICD-10-CM | POA: Diagnosis not present

## 2018-10-23 DIAGNOSIS — Z6828 Body mass index (BMI) 28.0-28.9, adult: Secondary | ICD-10-CM | POA: Diagnosis not present

## 2018-10-23 DIAGNOSIS — I5030 Unspecified diastolic (congestive) heart failure: Secondary | ICD-10-CM | POA: Diagnosis not present

## 2018-10-26 DIAGNOSIS — I5030 Unspecified diastolic (congestive) heart failure: Secondary | ICD-10-CM | POA: Diagnosis not present

## 2018-11-07 DIAGNOSIS — I5030 Unspecified diastolic (congestive) heart failure: Secondary | ICD-10-CM | POA: Diagnosis not present

## 2018-11-14 DIAGNOSIS — Z23 Encounter for immunization: Secondary | ICD-10-CM | POA: Diagnosis not present

## 2018-11-14 DIAGNOSIS — L03116 Cellulitis of left lower limb: Secondary | ICD-10-CM | POA: Diagnosis not present

## 2018-11-14 DIAGNOSIS — Z6827 Body mass index (BMI) 27.0-27.9, adult: Secondary | ICD-10-CM | POA: Diagnosis not present

## 2018-11-20 DIAGNOSIS — L03116 Cellulitis of left lower limb: Secondary | ICD-10-CM | POA: Diagnosis not present

## 2018-11-20 DIAGNOSIS — L97929 Non-pressure chronic ulcer of unspecified part of left lower leg with unspecified severity: Secondary | ICD-10-CM | POA: Diagnosis not present

## 2018-11-20 DIAGNOSIS — I872 Venous insufficiency (chronic) (peripheral): Secondary | ICD-10-CM | POA: Diagnosis not present

## 2018-11-22 DIAGNOSIS — Z7901 Long term (current) use of anticoagulants: Secondary | ICD-10-CM | POA: Diagnosis not present

## 2018-11-22 DIAGNOSIS — L97929 Non-pressure chronic ulcer of unspecified part of left lower leg with unspecified severity: Secondary | ICD-10-CM | POA: Diagnosis not present

## 2018-11-22 DIAGNOSIS — I872 Venous insufficiency (chronic) (peripheral): Secondary | ICD-10-CM | POA: Diagnosis not present

## 2018-11-22 DIAGNOSIS — T86821 Skin graft (allograft) (autograft) failure: Secondary | ICD-10-CM | POA: Diagnosis not present

## 2018-11-22 DIAGNOSIS — Z4801 Encounter for change or removal of surgical wound dressing: Secondary | ICD-10-CM | POA: Diagnosis not present

## 2018-11-22 DIAGNOSIS — L03116 Cellulitis of left lower limb: Secondary | ICD-10-CM | POA: Diagnosis not present

## 2018-11-26 DIAGNOSIS — L97929 Non-pressure chronic ulcer of unspecified part of left lower leg with unspecified severity: Secondary | ICD-10-CM | POA: Diagnosis not present

## 2018-11-26 DIAGNOSIS — Z4801 Encounter for change or removal of surgical wound dressing: Secondary | ICD-10-CM | POA: Diagnosis not present

## 2018-11-26 DIAGNOSIS — I872 Venous insufficiency (chronic) (peripheral): Secondary | ICD-10-CM | POA: Diagnosis not present

## 2018-11-26 DIAGNOSIS — Z7901 Long term (current) use of anticoagulants: Secondary | ICD-10-CM | POA: Diagnosis not present

## 2018-11-26 DIAGNOSIS — L03116 Cellulitis of left lower limb: Secondary | ICD-10-CM | POA: Diagnosis not present

## 2018-11-26 DIAGNOSIS — T86821 Skin graft (allograft) (autograft) failure: Secondary | ICD-10-CM | POA: Diagnosis not present

## 2018-11-27 DIAGNOSIS — Z4801 Encounter for change or removal of surgical wound dressing: Secondary | ICD-10-CM | POA: Diagnosis not present

## 2018-11-27 DIAGNOSIS — L03116 Cellulitis of left lower limb: Secondary | ICD-10-CM | POA: Diagnosis not present

## 2018-11-27 DIAGNOSIS — Z7901 Long term (current) use of anticoagulants: Secondary | ICD-10-CM | POA: Diagnosis not present

## 2018-11-27 DIAGNOSIS — I872 Venous insufficiency (chronic) (peripheral): Secondary | ICD-10-CM | POA: Diagnosis not present

## 2018-11-27 DIAGNOSIS — L97929 Non-pressure chronic ulcer of unspecified part of left lower leg with unspecified severity: Secondary | ICD-10-CM | POA: Diagnosis not present

## 2018-11-27 DIAGNOSIS — T86821 Skin graft (allograft) (autograft) failure: Secondary | ICD-10-CM | POA: Diagnosis not present

## 2018-11-28 DIAGNOSIS — L97928 Non-pressure chronic ulcer of unspecified part of left lower leg with other specified severity: Secondary | ICD-10-CM | POA: Diagnosis not present

## 2018-11-28 DIAGNOSIS — I83029 Varicose veins of left lower extremity with ulcer of unspecified site: Secondary | ICD-10-CM | POA: Diagnosis not present

## 2018-11-28 DIAGNOSIS — S81802A Unspecified open wound, left lower leg, initial encounter: Secondary | ICD-10-CM | POA: Diagnosis not present

## 2018-11-28 DIAGNOSIS — L03116 Cellulitis of left lower limb: Secondary | ICD-10-CM | POA: Diagnosis not present

## 2018-11-30 DIAGNOSIS — L97929 Non-pressure chronic ulcer of unspecified part of left lower leg with unspecified severity: Secondary | ICD-10-CM | POA: Diagnosis not present

## 2018-11-30 DIAGNOSIS — T86821 Skin graft (allograft) (autograft) failure: Secondary | ICD-10-CM | POA: Diagnosis not present

## 2018-11-30 DIAGNOSIS — L03116 Cellulitis of left lower limb: Secondary | ICD-10-CM | POA: Diagnosis not present

## 2018-11-30 DIAGNOSIS — I872 Venous insufficiency (chronic) (peripheral): Secondary | ICD-10-CM | POA: Diagnosis not present

## 2018-11-30 DIAGNOSIS — Z7901 Long term (current) use of anticoagulants: Secondary | ICD-10-CM | POA: Diagnosis not present

## 2018-11-30 DIAGNOSIS — Z4801 Encounter for change or removal of surgical wound dressing: Secondary | ICD-10-CM | POA: Diagnosis not present

## 2018-12-03 DIAGNOSIS — Z4801 Encounter for change or removal of surgical wound dressing: Secondary | ICD-10-CM | POA: Diagnosis not present

## 2018-12-03 DIAGNOSIS — I872 Venous insufficiency (chronic) (peripheral): Secondary | ICD-10-CM | POA: Diagnosis not present

## 2018-12-03 DIAGNOSIS — T86821 Skin graft (allograft) (autograft) failure: Secondary | ICD-10-CM | POA: Diagnosis not present

## 2018-12-03 DIAGNOSIS — L97929 Non-pressure chronic ulcer of unspecified part of left lower leg with unspecified severity: Secondary | ICD-10-CM | POA: Diagnosis not present

## 2018-12-03 DIAGNOSIS — Z7901 Long term (current) use of anticoagulants: Secondary | ICD-10-CM | POA: Diagnosis not present

## 2018-12-03 DIAGNOSIS — L03116 Cellulitis of left lower limb: Secondary | ICD-10-CM | POA: Diagnosis not present

## 2018-12-04 DIAGNOSIS — L03116 Cellulitis of left lower limb: Secondary | ICD-10-CM | POA: Diagnosis not present

## 2018-12-04 DIAGNOSIS — L97929 Non-pressure chronic ulcer of unspecified part of left lower leg with unspecified severity: Secondary | ICD-10-CM | POA: Diagnosis not present

## 2018-12-04 DIAGNOSIS — I872 Venous insufficiency (chronic) (peripheral): Secondary | ICD-10-CM | POA: Diagnosis not present

## 2018-12-04 DIAGNOSIS — R609 Edema, unspecified: Secondary | ICD-10-CM | POA: Diagnosis not present

## 2018-12-04 DIAGNOSIS — I739 Peripheral vascular disease, unspecified: Secondary | ICD-10-CM | POA: Diagnosis not present

## 2018-12-07 DIAGNOSIS — L03116 Cellulitis of left lower limb: Secondary | ICD-10-CM | POA: Diagnosis not present

## 2018-12-07 DIAGNOSIS — Z4801 Encounter for change or removal of surgical wound dressing: Secondary | ICD-10-CM | POA: Diagnosis not present

## 2018-12-07 DIAGNOSIS — T86821 Skin graft (allograft) (autograft) failure: Secondary | ICD-10-CM | POA: Diagnosis not present

## 2018-12-07 DIAGNOSIS — I872 Venous insufficiency (chronic) (peripheral): Secondary | ICD-10-CM | POA: Diagnosis not present

## 2018-12-07 DIAGNOSIS — L97929 Non-pressure chronic ulcer of unspecified part of left lower leg with unspecified severity: Secondary | ICD-10-CM | POA: Diagnosis not present

## 2018-12-07 DIAGNOSIS — Z7901 Long term (current) use of anticoagulants: Secondary | ICD-10-CM | POA: Diagnosis not present

## 2018-12-10 DIAGNOSIS — T86821 Skin graft (allograft) (autograft) failure: Secondary | ICD-10-CM | POA: Diagnosis not present

## 2018-12-10 DIAGNOSIS — Z7901 Long term (current) use of anticoagulants: Secondary | ICD-10-CM | POA: Diagnosis not present

## 2018-12-10 DIAGNOSIS — I872 Venous insufficiency (chronic) (peripheral): Secondary | ICD-10-CM | POA: Diagnosis not present

## 2018-12-10 DIAGNOSIS — Z4801 Encounter for change or removal of surgical wound dressing: Secondary | ICD-10-CM | POA: Diagnosis not present

## 2018-12-10 DIAGNOSIS — L03116 Cellulitis of left lower limb: Secondary | ICD-10-CM | POA: Diagnosis not present

## 2018-12-10 DIAGNOSIS — L97929 Non-pressure chronic ulcer of unspecified part of left lower leg with unspecified severity: Secondary | ICD-10-CM | POA: Diagnosis not present

## 2018-12-12 DIAGNOSIS — L03116 Cellulitis of left lower limb: Secondary | ICD-10-CM | POA: Diagnosis not present

## 2018-12-12 DIAGNOSIS — L97929 Non-pressure chronic ulcer of unspecified part of left lower leg with unspecified severity: Secondary | ICD-10-CM | POA: Diagnosis not present

## 2018-12-12 DIAGNOSIS — T86821 Skin graft (allograft) (autograft) failure: Secondary | ICD-10-CM | POA: Diagnosis not present

## 2018-12-12 DIAGNOSIS — I872 Venous insufficiency (chronic) (peripheral): Secondary | ICD-10-CM | POA: Diagnosis not present

## 2018-12-12 DIAGNOSIS — Z4801 Encounter for change or removal of surgical wound dressing: Secondary | ICD-10-CM | POA: Diagnosis not present

## 2018-12-12 DIAGNOSIS — Z7901 Long term (current) use of anticoagulants: Secondary | ICD-10-CM | POA: Diagnosis not present

## 2018-12-13 ENCOUNTER — Encounter: Payer: Medicare Other | Admitting: Vascular Surgery

## 2018-12-14 DIAGNOSIS — T86821 Skin graft (allograft) (autograft) failure: Secondary | ICD-10-CM | POA: Diagnosis not present

## 2018-12-14 DIAGNOSIS — Z4801 Encounter for change or removal of surgical wound dressing: Secondary | ICD-10-CM | POA: Diagnosis not present

## 2018-12-14 DIAGNOSIS — Z7901 Long term (current) use of anticoagulants: Secondary | ICD-10-CM | POA: Diagnosis not present

## 2018-12-14 DIAGNOSIS — L97929 Non-pressure chronic ulcer of unspecified part of left lower leg with unspecified severity: Secondary | ICD-10-CM | POA: Diagnosis not present

## 2018-12-14 DIAGNOSIS — L03116 Cellulitis of left lower limb: Secondary | ICD-10-CM | POA: Diagnosis not present

## 2018-12-14 DIAGNOSIS — I872 Venous insufficiency (chronic) (peripheral): Secondary | ICD-10-CM | POA: Diagnosis not present

## 2018-12-17 DIAGNOSIS — I872 Venous insufficiency (chronic) (peripheral): Secondary | ICD-10-CM | POA: Diagnosis not present

## 2018-12-17 DIAGNOSIS — Z7901 Long term (current) use of anticoagulants: Secondary | ICD-10-CM | POA: Diagnosis not present

## 2018-12-17 DIAGNOSIS — Z4801 Encounter for change or removal of surgical wound dressing: Secondary | ICD-10-CM | POA: Diagnosis not present

## 2018-12-17 DIAGNOSIS — L03116 Cellulitis of left lower limb: Secondary | ICD-10-CM | POA: Diagnosis not present

## 2018-12-17 DIAGNOSIS — L97929 Non-pressure chronic ulcer of unspecified part of left lower leg with unspecified severity: Secondary | ICD-10-CM | POA: Diagnosis not present

## 2018-12-17 DIAGNOSIS — T86821 Skin graft (allograft) (autograft) failure: Secondary | ICD-10-CM | POA: Diagnosis not present

## 2018-12-18 DIAGNOSIS — L03116 Cellulitis of left lower limb: Secondary | ICD-10-CM | POA: Diagnosis not present

## 2018-12-18 DIAGNOSIS — Z872 Personal history of diseases of the skin and subcutaneous tissue: Secondary | ICD-10-CM | POA: Diagnosis not present

## 2018-12-18 DIAGNOSIS — R609 Edema, unspecified: Secondary | ICD-10-CM | POA: Diagnosis not present

## 2018-12-18 DIAGNOSIS — I739 Peripheral vascular disease, unspecified: Secondary | ICD-10-CM | POA: Diagnosis not present

## 2018-12-19 DIAGNOSIS — Z7901 Long term (current) use of anticoagulants: Secondary | ICD-10-CM | POA: Diagnosis not present

## 2018-12-19 DIAGNOSIS — L03116 Cellulitis of left lower limb: Secondary | ICD-10-CM | POA: Diagnosis not present

## 2018-12-19 DIAGNOSIS — T86821 Skin graft (allograft) (autograft) failure: Secondary | ICD-10-CM | POA: Diagnosis not present

## 2018-12-19 DIAGNOSIS — Z4801 Encounter for change or removal of surgical wound dressing: Secondary | ICD-10-CM | POA: Diagnosis not present

## 2018-12-19 DIAGNOSIS — L97929 Non-pressure chronic ulcer of unspecified part of left lower leg with unspecified severity: Secondary | ICD-10-CM | POA: Diagnosis not present

## 2018-12-19 DIAGNOSIS — I872 Venous insufficiency (chronic) (peripheral): Secondary | ICD-10-CM | POA: Diagnosis not present

## 2018-12-21 DIAGNOSIS — T86821 Skin graft (allograft) (autograft) failure: Secondary | ICD-10-CM | POA: Diagnosis not present

## 2018-12-21 DIAGNOSIS — I872 Venous insufficiency (chronic) (peripheral): Secondary | ICD-10-CM | POA: Diagnosis not present

## 2018-12-21 DIAGNOSIS — Z4801 Encounter for change or removal of surgical wound dressing: Secondary | ICD-10-CM | POA: Diagnosis not present

## 2018-12-21 DIAGNOSIS — Z7901 Long term (current) use of anticoagulants: Secondary | ICD-10-CM | POA: Diagnosis not present

## 2018-12-21 DIAGNOSIS — L03116 Cellulitis of left lower limb: Secondary | ICD-10-CM | POA: Diagnosis not present

## 2018-12-21 DIAGNOSIS — L97929 Non-pressure chronic ulcer of unspecified part of left lower leg with unspecified severity: Secondary | ICD-10-CM | POA: Diagnosis not present

## 2018-12-22 DIAGNOSIS — L97929 Non-pressure chronic ulcer of unspecified part of left lower leg with unspecified severity: Secondary | ICD-10-CM | POA: Diagnosis not present

## 2018-12-22 DIAGNOSIS — T86821 Skin graft (allograft) (autograft) failure: Secondary | ICD-10-CM | POA: Diagnosis not present

## 2018-12-22 DIAGNOSIS — L03116 Cellulitis of left lower limb: Secondary | ICD-10-CM | POA: Diagnosis not present

## 2018-12-22 DIAGNOSIS — Z4801 Encounter for change or removal of surgical wound dressing: Secondary | ICD-10-CM | POA: Diagnosis not present

## 2018-12-22 DIAGNOSIS — I872 Venous insufficiency (chronic) (peripheral): Secondary | ICD-10-CM | POA: Diagnosis not present

## 2018-12-22 DIAGNOSIS — Z7901 Long term (current) use of anticoagulants: Secondary | ICD-10-CM | POA: Diagnosis not present

## 2018-12-24 DIAGNOSIS — Z7901 Long term (current) use of anticoagulants: Secondary | ICD-10-CM | POA: Diagnosis not present

## 2018-12-24 DIAGNOSIS — T86821 Skin graft (allograft) (autograft) failure: Secondary | ICD-10-CM | POA: Diagnosis not present

## 2018-12-24 DIAGNOSIS — L03116 Cellulitis of left lower limb: Secondary | ICD-10-CM | POA: Diagnosis not present

## 2018-12-24 DIAGNOSIS — I872 Venous insufficiency (chronic) (peripheral): Secondary | ICD-10-CM | POA: Diagnosis not present

## 2018-12-24 DIAGNOSIS — L97929 Non-pressure chronic ulcer of unspecified part of left lower leg with unspecified severity: Secondary | ICD-10-CM | POA: Diagnosis not present

## 2018-12-24 DIAGNOSIS — Z4801 Encounter for change or removal of surgical wound dressing: Secondary | ICD-10-CM | POA: Diagnosis not present

## 2018-12-26 DIAGNOSIS — L97929 Non-pressure chronic ulcer of unspecified part of left lower leg with unspecified severity: Secondary | ICD-10-CM | POA: Diagnosis not present

## 2018-12-26 DIAGNOSIS — T86821 Skin graft (allograft) (autograft) failure: Secondary | ICD-10-CM | POA: Diagnosis not present

## 2018-12-26 DIAGNOSIS — Z7901 Long term (current) use of anticoagulants: Secondary | ICD-10-CM | POA: Diagnosis not present

## 2018-12-26 DIAGNOSIS — Z4801 Encounter for change or removal of surgical wound dressing: Secondary | ICD-10-CM | POA: Diagnosis not present

## 2018-12-26 DIAGNOSIS — I872 Venous insufficiency (chronic) (peripheral): Secondary | ICD-10-CM | POA: Diagnosis not present

## 2018-12-26 DIAGNOSIS — L03116 Cellulitis of left lower limb: Secondary | ICD-10-CM | POA: Diagnosis not present

## 2018-12-28 DIAGNOSIS — I872 Venous insufficiency (chronic) (peripheral): Secondary | ICD-10-CM | POA: Diagnosis not present

## 2018-12-28 DIAGNOSIS — Z7901 Long term (current) use of anticoagulants: Secondary | ICD-10-CM | POA: Diagnosis not present

## 2018-12-28 DIAGNOSIS — L97929 Non-pressure chronic ulcer of unspecified part of left lower leg with unspecified severity: Secondary | ICD-10-CM | POA: Diagnosis not present

## 2018-12-28 DIAGNOSIS — Z4801 Encounter for change or removal of surgical wound dressing: Secondary | ICD-10-CM | POA: Diagnosis not present

## 2018-12-28 DIAGNOSIS — T86821 Skin graft (allograft) (autograft) failure: Secondary | ICD-10-CM | POA: Diagnosis not present

## 2018-12-28 DIAGNOSIS — L03116 Cellulitis of left lower limb: Secondary | ICD-10-CM | POA: Diagnosis not present

## 2018-12-31 DIAGNOSIS — I872 Venous insufficiency (chronic) (peripheral): Secondary | ICD-10-CM | POA: Diagnosis not present

## 2018-12-31 DIAGNOSIS — Z7901 Long term (current) use of anticoagulants: Secondary | ICD-10-CM | POA: Diagnosis not present

## 2018-12-31 DIAGNOSIS — L03116 Cellulitis of left lower limb: Secondary | ICD-10-CM | POA: Diagnosis not present

## 2018-12-31 DIAGNOSIS — Z4801 Encounter for change or removal of surgical wound dressing: Secondary | ICD-10-CM | POA: Diagnosis not present

## 2018-12-31 DIAGNOSIS — L97929 Non-pressure chronic ulcer of unspecified part of left lower leg with unspecified severity: Secondary | ICD-10-CM | POA: Diagnosis not present

## 2018-12-31 DIAGNOSIS — T86821 Skin graft (allograft) (autograft) failure: Secondary | ICD-10-CM | POA: Diagnosis not present

## 2019-01-02 ENCOUNTER — Encounter: Payer: Medicare Other | Admitting: Vascular Surgery

## 2019-01-02 DIAGNOSIS — L97929 Non-pressure chronic ulcer of unspecified part of left lower leg with unspecified severity: Secondary | ICD-10-CM | POA: Diagnosis not present

## 2019-01-02 DIAGNOSIS — Z4801 Encounter for change or removal of surgical wound dressing: Secondary | ICD-10-CM | POA: Diagnosis not present

## 2019-01-02 DIAGNOSIS — L03116 Cellulitis of left lower limb: Secondary | ICD-10-CM | POA: Diagnosis not present

## 2019-01-02 DIAGNOSIS — Z7901 Long term (current) use of anticoagulants: Secondary | ICD-10-CM | POA: Diagnosis not present

## 2019-01-02 DIAGNOSIS — T86821 Skin graft (allograft) (autograft) failure: Secondary | ICD-10-CM | POA: Diagnosis not present

## 2019-01-02 DIAGNOSIS — I872 Venous insufficiency (chronic) (peripheral): Secondary | ICD-10-CM | POA: Diagnosis not present

## 2019-01-04 DIAGNOSIS — I872 Venous insufficiency (chronic) (peripheral): Secondary | ICD-10-CM | POA: Diagnosis not present

## 2019-01-04 DIAGNOSIS — L03116 Cellulitis of left lower limb: Secondary | ICD-10-CM | POA: Diagnosis not present

## 2019-01-04 DIAGNOSIS — W19XXXA Unspecified fall, initial encounter: Secondary | ICD-10-CM | POA: Diagnosis not present

## 2019-01-04 DIAGNOSIS — S01311A Laceration without foreign body of right ear, initial encounter: Secondary | ICD-10-CM | POA: Diagnosis not present

## 2019-01-04 DIAGNOSIS — S199XXA Unspecified injury of neck, initial encounter: Secondary | ICD-10-CM | POA: Diagnosis not present

## 2019-01-04 DIAGNOSIS — F329 Major depressive disorder, single episode, unspecified: Secondary | ICD-10-CM | POA: Diagnosis not present

## 2019-01-04 DIAGNOSIS — Z87891 Personal history of nicotine dependence: Secondary | ICD-10-CM | POA: Diagnosis not present

## 2019-01-04 DIAGNOSIS — E785 Hyperlipidemia, unspecified: Secondary | ICD-10-CM | POA: Diagnosis not present

## 2019-01-04 DIAGNOSIS — I4891 Unspecified atrial fibrillation: Secondary | ICD-10-CM | POA: Diagnosis not present

## 2019-01-04 DIAGNOSIS — S61511A Laceration without foreign body of right wrist, initial encounter: Secondary | ICD-10-CM | POA: Diagnosis not present

## 2019-01-04 DIAGNOSIS — I6782 Cerebral ischemia: Secondary | ICD-10-CM | POA: Diagnosis not present

## 2019-01-04 DIAGNOSIS — T86821 Skin graft (allograft) (autograft) failure: Secondary | ICD-10-CM | POA: Diagnosis not present

## 2019-01-04 DIAGNOSIS — S0990XA Unspecified injury of head, initial encounter: Secondary | ICD-10-CM | POA: Diagnosis not present

## 2019-01-04 DIAGNOSIS — Z8673 Personal history of transient ischemic attack (TIA), and cerebral infarction without residual deficits: Secondary | ICD-10-CM | POA: Diagnosis not present

## 2019-01-04 DIAGNOSIS — Z86718 Personal history of other venous thrombosis and embolism: Secondary | ICD-10-CM | POA: Diagnosis not present

## 2019-01-04 DIAGNOSIS — Z7901 Long term (current) use of anticoagulants: Secondary | ICD-10-CM | POA: Diagnosis not present

## 2019-01-04 DIAGNOSIS — Z79899 Other long term (current) drug therapy: Secondary | ICD-10-CM | POA: Diagnosis not present

## 2019-01-04 DIAGNOSIS — Z4801 Encounter for change or removal of surgical wound dressing: Secondary | ICD-10-CM | POA: Diagnosis not present

## 2019-01-04 DIAGNOSIS — I10 Essential (primary) hypertension: Secondary | ICD-10-CM | POA: Diagnosis not present

## 2019-01-04 DIAGNOSIS — L97929 Non-pressure chronic ulcer of unspecified part of left lower leg with unspecified severity: Secondary | ICD-10-CM | POA: Diagnosis not present

## 2019-01-04 DIAGNOSIS — W01198A Fall on same level from slipping, tripping and stumbling with subsequent striking against other object, initial encounter: Secondary | ICD-10-CM | POA: Diagnosis not present

## 2019-01-04 DIAGNOSIS — K219 Gastro-esophageal reflux disease without esophagitis: Secondary | ICD-10-CM | POA: Diagnosis not present

## 2019-01-04 DIAGNOSIS — R7303 Prediabetes: Secondary | ICD-10-CM | POA: Diagnosis not present

## 2019-01-04 DIAGNOSIS — M47812 Spondylosis without myelopathy or radiculopathy, cervical region: Secondary | ICD-10-CM | POA: Diagnosis not present

## 2019-01-07 DIAGNOSIS — T86821 Skin graft (allograft) (autograft) failure: Secondary | ICD-10-CM | POA: Diagnosis not present

## 2019-01-07 DIAGNOSIS — Z4801 Encounter for change or removal of surgical wound dressing: Secondary | ICD-10-CM | POA: Diagnosis not present

## 2019-01-07 DIAGNOSIS — Z7901 Long term (current) use of anticoagulants: Secondary | ICD-10-CM | POA: Diagnosis not present

## 2019-01-07 DIAGNOSIS — L97929 Non-pressure chronic ulcer of unspecified part of left lower leg with unspecified severity: Secondary | ICD-10-CM | POA: Diagnosis not present

## 2019-01-07 DIAGNOSIS — I872 Venous insufficiency (chronic) (peripheral): Secondary | ICD-10-CM | POA: Diagnosis not present

## 2019-01-07 DIAGNOSIS — L03116 Cellulitis of left lower limb: Secondary | ICD-10-CM | POA: Diagnosis not present

## 2019-01-08 DIAGNOSIS — S01311D Laceration without foreign body of right ear, subsequent encounter: Secondary | ICD-10-CM | POA: Diagnosis not present

## 2019-01-08 DIAGNOSIS — I872 Venous insufficiency (chronic) (peripheral): Secondary | ICD-10-CM | POA: Diagnosis not present

## 2019-01-08 DIAGNOSIS — L97929 Non-pressure chronic ulcer of unspecified part of left lower leg with unspecified severity: Secondary | ICD-10-CM | POA: Diagnosis not present

## 2019-01-08 DIAGNOSIS — I890 Lymphedema, not elsewhere classified: Secondary | ICD-10-CM | POA: Diagnosis not present

## 2019-01-08 DIAGNOSIS — S61511D Laceration without foreign body of right wrist, subsequent encounter: Secondary | ICD-10-CM | POA: Diagnosis not present

## 2019-01-11 DIAGNOSIS — L03116 Cellulitis of left lower limb: Secondary | ICD-10-CM | POA: Diagnosis not present

## 2019-01-11 DIAGNOSIS — I872 Venous insufficiency (chronic) (peripheral): Secondary | ICD-10-CM | POA: Diagnosis not present

## 2019-01-11 DIAGNOSIS — T86821 Skin graft (allograft) (autograft) failure: Secondary | ICD-10-CM | POA: Diagnosis not present

## 2019-01-11 DIAGNOSIS — Z7901 Long term (current) use of anticoagulants: Secondary | ICD-10-CM | POA: Diagnosis not present

## 2019-01-11 DIAGNOSIS — L97929 Non-pressure chronic ulcer of unspecified part of left lower leg with unspecified severity: Secondary | ICD-10-CM | POA: Diagnosis not present

## 2019-01-11 DIAGNOSIS — Z4801 Encounter for change or removal of surgical wound dressing: Secondary | ICD-10-CM | POA: Diagnosis not present

## 2019-01-14 DIAGNOSIS — Z4801 Encounter for change or removal of surgical wound dressing: Secondary | ICD-10-CM | POA: Diagnosis not present

## 2019-01-14 DIAGNOSIS — T86821 Skin graft (allograft) (autograft) failure: Secondary | ICD-10-CM | POA: Diagnosis not present

## 2019-01-14 DIAGNOSIS — Z7901 Long term (current) use of anticoagulants: Secondary | ICD-10-CM | POA: Diagnosis not present

## 2019-01-14 DIAGNOSIS — L03116 Cellulitis of left lower limb: Secondary | ICD-10-CM | POA: Diagnosis not present

## 2019-01-14 DIAGNOSIS — I872 Venous insufficiency (chronic) (peripheral): Secondary | ICD-10-CM | POA: Diagnosis not present

## 2019-01-14 DIAGNOSIS — L97929 Non-pressure chronic ulcer of unspecified part of left lower leg with unspecified severity: Secondary | ICD-10-CM | POA: Diagnosis not present

## 2019-01-16 DIAGNOSIS — Z7901 Long term (current) use of anticoagulants: Secondary | ICD-10-CM | POA: Diagnosis not present

## 2019-01-16 DIAGNOSIS — T86821 Skin graft (allograft) (autograft) failure: Secondary | ICD-10-CM | POA: Diagnosis not present

## 2019-01-16 DIAGNOSIS — L03116 Cellulitis of left lower limb: Secondary | ICD-10-CM | POA: Diagnosis not present

## 2019-01-16 DIAGNOSIS — Z4801 Encounter for change or removal of surgical wound dressing: Secondary | ICD-10-CM | POA: Diagnosis not present

## 2019-01-16 DIAGNOSIS — L97929 Non-pressure chronic ulcer of unspecified part of left lower leg with unspecified severity: Secondary | ICD-10-CM | POA: Diagnosis not present

## 2019-01-16 DIAGNOSIS — I872 Venous insufficiency (chronic) (peripheral): Secondary | ICD-10-CM | POA: Diagnosis not present

## 2019-01-18 DIAGNOSIS — T86821 Skin graft (allograft) (autograft) failure: Secondary | ICD-10-CM | POA: Diagnosis not present

## 2019-01-18 DIAGNOSIS — I872 Venous insufficiency (chronic) (peripheral): Secondary | ICD-10-CM | POA: Diagnosis not present

## 2019-01-18 DIAGNOSIS — Z4801 Encounter for change or removal of surgical wound dressing: Secondary | ICD-10-CM | POA: Diagnosis not present

## 2019-01-18 DIAGNOSIS — L97929 Non-pressure chronic ulcer of unspecified part of left lower leg with unspecified severity: Secondary | ICD-10-CM | POA: Diagnosis not present

## 2019-01-18 DIAGNOSIS — L03116 Cellulitis of left lower limb: Secondary | ICD-10-CM | POA: Diagnosis not present

## 2019-01-18 DIAGNOSIS — Z7901 Long term (current) use of anticoagulants: Secondary | ICD-10-CM | POA: Diagnosis not present

## 2019-01-21 DIAGNOSIS — T86821 Skin graft (allograft) (autograft) failure: Secondary | ICD-10-CM | POA: Diagnosis not present

## 2019-01-21 DIAGNOSIS — I872 Venous insufficiency (chronic) (peripheral): Secondary | ICD-10-CM | POA: Diagnosis not present

## 2019-01-21 DIAGNOSIS — L97929 Non-pressure chronic ulcer of unspecified part of left lower leg with unspecified severity: Secondary | ICD-10-CM | POA: Diagnosis not present

## 2019-01-21 DIAGNOSIS — Z7901 Long term (current) use of anticoagulants: Secondary | ICD-10-CM | POA: Diagnosis not present

## 2019-01-21 DIAGNOSIS — Z4801 Encounter for change or removal of surgical wound dressing: Secondary | ICD-10-CM | POA: Diagnosis not present

## 2019-01-22 DIAGNOSIS — S61511D Laceration without foreign body of right wrist, subsequent encounter: Secondary | ICD-10-CM | POA: Diagnosis not present

## 2019-01-22 DIAGNOSIS — L97929 Non-pressure chronic ulcer of unspecified part of left lower leg with unspecified severity: Secondary | ICD-10-CM | POA: Diagnosis not present

## 2019-01-22 DIAGNOSIS — I890 Lymphedema, not elsewhere classified: Secondary | ICD-10-CM | POA: Diagnosis not present

## 2019-01-22 DIAGNOSIS — I872 Venous insufficiency (chronic) (peripheral): Secondary | ICD-10-CM | POA: Diagnosis not present

## 2019-01-22 DIAGNOSIS — S01311D Laceration without foreign body of right ear, subsequent encounter: Secondary | ICD-10-CM | POA: Diagnosis not present

## 2019-01-23 DIAGNOSIS — L97929 Non-pressure chronic ulcer of unspecified part of left lower leg with unspecified severity: Secondary | ICD-10-CM | POA: Diagnosis not present

## 2019-01-23 DIAGNOSIS — Z7901 Long term (current) use of anticoagulants: Secondary | ICD-10-CM | POA: Diagnosis not present

## 2019-01-23 DIAGNOSIS — Z4801 Encounter for change or removal of surgical wound dressing: Secondary | ICD-10-CM | POA: Diagnosis not present

## 2019-01-23 DIAGNOSIS — I872 Venous insufficiency (chronic) (peripheral): Secondary | ICD-10-CM | POA: Diagnosis not present

## 2019-01-23 DIAGNOSIS — T86821 Skin graft (allograft) (autograft) failure: Secondary | ICD-10-CM | POA: Diagnosis not present

## 2019-01-25 DIAGNOSIS — T86821 Skin graft (allograft) (autograft) failure: Secondary | ICD-10-CM | POA: Diagnosis not present

## 2019-01-25 DIAGNOSIS — Z4801 Encounter for change or removal of surgical wound dressing: Secondary | ICD-10-CM | POA: Diagnosis not present

## 2019-01-25 DIAGNOSIS — Z7901 Long term (current) use of anticoagulants: Secondary | ICD-10-CM | POA: Diagnosis not present

## 2019-01-25 DIAGNOSIS — I872 Venous insufficiency (chronic) (peripheral): Secondary | ICD-10-CM | POA: Diagnosis not present

## 2019-01-25 DIAGNOSIS — L97929 Non-pressure chronic ulcer of unspecified part of left lower leg with unspecified severity: Secondary | ICD-10-CM | POA: Diagnosis not present

## 2019-01-28 DIAGNOSIS — I872 Venous insufficiency (chronic) (peripheral): Secondary | ICD-10-CM | POA: Diagnosis not present

## 2019-01-28 DIAGNOSIS — Z7901 Long term (current) use of anticoagulants: Secondary | ICD-10-CM | POA: Diagnosis not present

## 2019-01-28 DIAGNOSIS — T86821 Skin graft (allograft) (autograft) failure: Secondary | ICD-10-CM | POA: Diagnosis not present

## 2019-01-28 DIAGNOSIS — L97929 Non-pressure chronic ulcer of unspecified part of left lower leg with unspecified severity: Secondary | ICD-10-CM | POA: Diagnosis not present

## 2019-01-28 DIAGNOSIS — Z4801 Encounter for change or removal of surgical wound dressing: Secondary | ICD-10-CM | POA: Diagnosis not present

## 2019-01-30 DIAGNOSIS — Z4801 Encounter for change or removal of surgical wound dressing: Secondary | ICD-10-CM | POA: Diagnosis not present

## 2019-01-30 DIAGNOSIS — T86821 Skin graft (allograft) (autograft) failure: Secondary | ICD-10-CM | POA: Diagnosis not present

## 2019-01-30 DIAGNOSIS — L97929 Non-pressure chronic ulcer of unspecified part of left lower leg with unspecified severity: Secondary | ICD-10-CM | POA: Diagnosis not present

## 2019-01-30 DIAGNOSIS — I872 Venous insufficiency (chronic) (peripheral): Secondary | ICD-10-CM | POA: Diagnosis not present

## 2019-01-30 DIAGNOSIS — Z7901 Long term (current) use of anticoagulants: Secondary | ICD-10-CM | POA: Diagnosis not present

## 2019-02-01 DIAGNOSIS — T86821 Skin graft (allograft) (autograft) failure: Secondary | ICD-10-CM | POA: Diagnosis not present

## 2019-02-01 DIAGNOSIS — Z4801 Encounter for change or removal of surgical wound dressing: Secondary | ICD-10-CM | POA: Diagnosis not present

## 2019-02-01 DIAGNOSIS — Z7901 Long term (current) use of anticoagulants: Secondary | ICD-10-CM | POA: Diagnosis not present

## 2019-02-01 DIAGNOSIS — I872 Venous insufficiency (chronic) (peripheral): Secondary | ICD-10-CM | POA: Diagnosis not present

## 2019-02-01 DIAGNOSIS — L97929 Non-pressure chronic ulcer of unspecified part of left lower leg with unspecified severity: Secondary | ICD-10-CM | POA: Diagnosis not present

## 2019-02-04 DIAGNOSIS — L97929 Non-pressure chronic ulcer of unspecified part of left lower leg with unspecified severity: Secondary | ICD-10-CM | POA: Diagnosis not present

## 2019-02-04 DIAGNOSIS — Z7901 Long term (current) use of anticoagulants: Secondary | ICD-10-CM | POA: Diagnosis not present

## 2019-02-04 DIAGNOSIS — I872 Venous insufficiency (chronic) (peripheral): Secondary | ICD-10-CM | POA: Diagnosis not present

## 2019-02-04 DIAGNOSIS — T86821 Skin graft (allograft) (autograft) failure: Secondary | ICD-10-CM | POA: Diagnosis not present

## 2019-02-04 DIAGNOSIS — Z4801 Encounter for change or removal of surgical wound dressing: Secondary | ICD-10-CM | POA: Diagnosis not present

## 2019-02-06 DIAGNOSIS — T86821 Skin graft (allograft) (autograft) failure: Secondary | ICD-10-CM | POA: Diagnosis not present

## 2019-02-06 DIAGNOSIS — Z4801 Encounter for change or removal of surgical wound dressing: Secondary | ICD-10-CM | POA: Diagnosis not present

## 2019-02-06 DIAGNOSIS — I872 Venous insufficiency (chronic) (peripheral): Secondary | ICD-10-CM | POA: Diagnosis not present

## 2019-02-06 DIAGNOSIS — L97929 Non-pressure chronic ulcer of unspecified part of left lower leg with unspecified severity: Secondary | ICD-10-CM | POA: Diagnosis not present

## 2019-02-06 DIAGNOSIS — Z7901 Long term (current) use of anticoagulants: Secondary | ICD-10-CM | POA: Diagnosis not present

## 2019-02-08 DIAGNOSIS — Z7901 Long term (current) use of anticoagulants: Secondary | ICD-10-CM | POA: Diagnosis not present

## 2019-02-08 DIAGNOSIS — Z4801 Encounter for change or removal of surgical wound dressing: Secondary | ICD-10-CM | POA: Diagnosis not present

## 2019-02-08 DIAGNOSIS — T86821 Skin graft (allograft) (autograft) failure: Secondary | ICD-10-CM | POA: Diagnosis not present

## 2019-02-08 DIAGNOSIS — I872 Venous insufficiency (chronic) (peripheral): Secondary | ICD-10-CM | POA: Diagnosis not present

## 2019-02-08 DIAGNOSIS — L97929 Non-pressure chronic ulcer of unspecified part of left lower leg with unspecified severity: Secondary | ICD-10-CM | POA: Diagnosis not present

## 2019-02-11 DIAGNOSIS — T86821 Skin graft (allograft) (autograft) failure: Secondary | ICD-10-CM | POA: Diagnosis not present

## 2019-02-11 DIAGNOSIS — Z7901 Long term (current) use of anticoagulants: Secondary | ICD-10-CM | POA: Diagnosis not present

## 2019-02-11 DIAGNOSIS — L97929 Non-pressure chronic ulcer of unspecified part of left lower leg with unspecified severity: Secondary | ICD-10-CM | POA: Diagnosis not present

## 2019-02-11 DIAGNOSIS — Z4801 Encounter for change or removal of surgical wound dressing: Secondary | ICD-10-CM | POA: Diagnosis not present

## 2019-02-11 DIAGNOSIS — I872 Venous insufficiency (chronic) (peripheral): Secondary | ICD-10-CM | POA: Diagnosis not present

## 2019-02-13 DIAGNOSIS — L97929 Non-pressure chronic ulcer of unspecified part of left lower leg with unspecified severity: Secondary | ICD-10-CM | POA: Diagnosis not present

## 2019-02-13 DIAGNOSIS — I872 Venous insufficiency (chronic) (peripheral): Secondary | ICD-10-CM | POA: Diagnosis not present

## 2019-02-13 DIAGNOSIS — T86821 Skin graft (allograft) (autograft) failure: Secondary | ICD-10-CM | POA: Diagnosis not present

## 2019-02-13 DIAGNOSIS — Z7901 Long term (current) use of anticoagulants: Secondary | ICD-10-CM | POA: Diagnosis not present

## 2019-02-13 DIAGNOSIS — Z4801 Encounter for change or removal of surgical wound dressing: Secondary | ICD-10-CM | POA: Diagnosis not present

## 2019-02-15 DIAGNOSIS — T86821 Skin graft (allograft) (autograft) failure: Secondary | ICD-10-CM | POA: Diagnosis not present

## 2019-02-15 DIAGNOSIS — Z7901 Long term (current) use of anticoagulants: Secondary | ICD-10-CM | POA: Diagnosis not present

## 2019-02-15 DIAGNOSIS — Z4801 Encounter for change or removal of surgical wound dressing: Secondary | ICD-10-CM | POA: Diagnosis not present

## 2019-02-15 DIAGNOSIS — I872 Venous insufficiency (chronic) (peripheral): Secondary | ICD-10-CM | POA: Diagnosis not present

## 2019-02-15 DIAGNOSIS — L97929 Non-pressure chronic ulcer of unspecified part of left lower leg with unspecified severity: Secondary | ICD-10-CM | POA: Diagnosis not present

## 2019-02-17 DIAGNOSIS — T86821 Skin graft (allograft) (autograft) failure: Secondary | ICD-10-CM | POA: Diagnosis not present

## 2019-02-17 DIAGNOSIS — Z4801 Encounter for change or removal of surgical wound dressing: Secondary | ICD-10-CM | POA: Diagnosis not present

## 2019-02-17 DIAGNOSIS — L97929 Non-pressure chronic ulcer of unspecified part of left lower leg with unspecified severity: Secondary | ICD-10-CM | POA: Diagnosis not present

## 2019-02-17 DIAGNOSIS — I872 Venous insufficiency (chronic) (peripheral): Secondary | ICD-10-CM | POA: Diagnosis not present

## 2019-02-17 DIAGNOSIS — Z7901 Long term (current) use of anticoagulants: Secondary | ICD-10-CM | POA: Diagnosis not present

## 2019-02-20 DIAGNOSIS — I872 Venous insufficiency (chronic) (peripheral): Secondary | ICD-10-CM | POA: Diagnosis not present

## 2019-02-20 DIAGNOSIS — Z4801 Encounter for change or removal of surgical wound dressing: Secondary | ICD-10-CM | POA: Diagnosis not present

## 2019-02-20 DIAGNOSIS — L97929 Non-pressure chronic ulcer of unspecified part of left lower leg with unspecified severity: Secondary | ICD-10-CM | POA: Diagnosis not present

## 2019-02-20 DIAGNOSIS — T86821 Skin graft (allograft) (autograft) failure: Secondary | ICD-10-CM | POA: Diagnosis not present

## 2019-02-20 DIAGNOSIS — Z7901 Long term (current) use of anticoagulants: Secondary | ICD-10-CM | POA: Diagnosis not present

## 2019-02-21 DIAGNOSIS — I872 Venous insufficiency (chronic) (peripheral): Secondary | ICD-10-CM | POA: Diagnosis not present

## 2019-02-21 DIAGNOSIS — Z4801 Encounter for change or removal of surgical wound dressing: Secondary | ICD-10-CM | POA: Diagnosis not present

## 2019-02-21 DIAGNOSIS — Z7901 Long term (current) use of anticoagulants: Secondary | ICD-10-CM | POA: Diagnosis not present

## 2019-02-21 DIAGNOSIS — L97929 Non-pressure chronic ulcer of unspecified part of left lower leg with unspecified severity: Secondary | ICD-10-CM | POA: Diagnosis not present

## 2019-02-21 DIAGNOSIS — T86821 Skin graft (allograft) (autograft) failure: Secondary | ICD-10-CM | POA: Diagnosis not present

## 2019-02-24 DIAGNOSIS — Z7901 Long term (current) use of anticoagulants: Secondary | ICD-10-CM | POA: Diagnosis not present

## 2019-02-24 DIAGNOSIS — L97929 Non-pressure chronic ulcer of unspecified part of left lower leg with unspecified severity: Secondary | ICD-10-CM | POA: Diagnosis not present

## 2019-02-24 DIAGNOSIS — Z4801 Encounter for change or removal of surgical wound dressing: Secondary | ICD-10-CM | POA: Diagnosis not present

## 2019-02-24 DIAGNOSIS — I872 Venous insufficiency (chronic) (peripheral): Secondary | ICD-10-CM | POA: Diagnosis not present

## 2019-02-24 DIAGNOSIS — T86821 Skin graft (allograft) (autograft) failure: Secondary | ICD-10-CM | POA: Diagnosis not present

## 2019-02-26 DIAGNOSIS — I872 Venous insufficiency (chronic) (peripheral): Secondary | ICD-10-CM | POA: Diagnosis not present

## 2019-02-26 DIAGNOSIS — T86821 Skin graft (allograft) (autograft) failure: Secondary | ICD-10-CM | POA: Diagnosis not present

## 2019-02-26 DIAGNOSIS — Z4801 Encounter for change or removal of surgical wound dressing: Secondary | ICD-10-CM | POA: Diagnosis not present

## 2019-02-26 DIAGNOSIS — L97929 Non-pressure chronic ulcer of unspecified part of left lower leg with unspecified severity: Secondary | ICD-10-CM | POA: Diagnosis not present

## 2019-02-26 DIAGNOSIS — Z7901 Long term (current) use of anticoagulants: Secondary | ICD-10-CM | POA: Diagnosis not present

## 2019-02-28 DIAGNOSIS — L97929 Non-pressure chronic ulcer of unspecified part of left lower leg with unspecified severity: Secondary | ICD-10-CM | POA: Diagnosis not present

## 2019-02-28 DIAGNOSIS — Z4801 Encounter for change or removal of surgical wound dressing: Secondary | ICD-10-CM | POA: Diagnosis not present

## 2019-02-28 DIAGNOSIS — T86821 Skin graft (allograft) (autograft) failure: Secondary | ICD-10-CM | POA: Diagnosis not present

## 2019-02-28 DIAGNOSIS — I872 Venous insufficiency (chronic) (peripheral): Secondary | ICD-10-CM | POA: Diagnosis not present

## 2019-02-28 DIAGNOSIS — Z7901 Long term (current) use of anticoagulants: Secondary | ICD-10-CM | POA: Diagnosis not present

## 2019-03-04 DIAGNOSIS — Z7901 Long term (current) use of anticoagulants: Secondary | ICD-10-CM | POA: Diagnosis not present

## 2019-03-04 DIAGNOSIS — Z4801 Encounter for change or removal of surgical wound dressing: Secondary | ICD-10-CM | POA: Diagnosis not present

## 2019-03-04 DIAGNOSIS — L97929 Non-pressure chronic ulcer of unspecified part of left lower leg with unspecified severity: Secondary | ICD-10-CM | POA: Diagnosis not present

## 2019-03-04 DIAGNOSIS — T86821 Skin graft (allograft) (autograft) failure: Secondary | ICD-10-CM | POA: Diagnosis not present

## 2019-03-04 DIAGNOSIS — I872 Venous insufficiency (chronic) (peripheral): Secondary | ICD-10-CM | POA: Diagnosis not present

## 2019-03-06 DIAGNOSIS — I872 Venous insufficiency (chronic) (peripheral): Secondary | ICD-10-CM | POA: Diagnosis not present

## 2019-03-06 DIAGNOSIS — Z7901 Long term (current) use of anticoagulants: Secondary | ICD-10-CM | POA: Diagnosis not present

## 2019-03-06 DIAGNOSIS — Z4801 Encounter for change or removal of surgical wound dressing: Secondary | ICD-10-CM | POA: Diagnosis not present

## 2019-03-06 DIAGNOSIS — L97929 Non-pressure chronic ulcer of unspecified part of left lower leg with unspecified severity: Secondary | ICD-10-CM | POA: Diagnosis not present

## 2019-03-06 DIAGNOSIS — T86821 Skin graft (allograft) (autograft) failure: Secondary | ICD-10-CM | POA: Diagnosis not present

## 2019-03-08 DIAGNOSIS — T86821 Skin graft (allograft) (autograft) failure: Secondary | ICD-10-CM | POA: Diagnosis not present

## 2019-03-08 DIAGNOSIS — I872 Venous insufficiency (chronic) (peripheral): Secondary | ICD-10-CM | POA: Diagnosis not present

## 2019-03-08 DIAGNOSIS — L97929 Non-pressure chronic ulcer of unspecified part of left lower leg with unspecified severity: Secondary | ICD-10-CM | POA: Diagnosis not present

## 2019-03-08 DIAGNOSIS — Z7901 Long term (current) use of anticoagulants: Secondary | ICD-10-CM | POA: Diagnosis not present

## 2019-03-08 DIAGNOSIS — Z4801 Encounter for change or removal of surgical wound dressing: Secondary | ICD-10-CM | POA: Diagnosis not present

## 2019-03-11 DIAGNOSIS — I872 Venous insufficiency (chronic) (peripheral): Secondary | ICD-10-CM | POA: Diagnosis not present

## 2019-03-11 DIAGNOSIS — Z7901 Long term (current) use of anticoagulants: Secondary | ICD-10-CM | POA: Diagnosis not present

## 2019-03-11 DIAGNOSIS — Z4801 Encounter for change or removal of surgical wound dressing: Secondary | ICD-10-CM | POA: Diagnosis not present

## 2019-03-11 DIAGNOSIS — T86821 Skin graft (allograft) (autograft) failure: Secondary | ICD-10-CM | POA: Diagnosis not present

## 2019-03-11 DIAGNOSIS — L97929 Non-pressure chronic ulcer of unspecified part of left lower leg with unspecified severity: Secondary | ICD-10-CM | POA: Diagnosis not present

## 2019-03-13 DIAGNOSIS — Z7901 Long term (current) use of anticoagulants: Secondary | ICD-10-CM | POA: Diagnosis not present

## 2019-03-13 DIAGNOSIS — Z4801 Encounter for change or removal of surgical wound dressing: Secondary | ICD-10-CM | POA: Diagnosis not present

## 2019-03-13 DIAGNOSIS — L97929 Non-pressure chronic ulcer of unspecified part of left lower leg with unspecified severity: Secondary | ICD-10-CM | POA: Diagnosis not present

## 2019-03-13 DIAGNOSIS — T86821 Skin graft (allograft) (autograft) failure: Secondary | ICD-10-CM | POA: Diagnosis not present

## 2019-03-13 DIAGNOSIS — I872 Venous insufficiency (chronic) (peripheral): Secondary | ICD-10-CM | POA: Diagnosis not present

## 2019-03-15 DIAGNOSIS — L97929 Non-pressure chronic ulcer of unspecified part of left lower leg with unspecified severity: Secondary | ICD-10-CM | POA: Diagnosis not present

## 2019-03-15 DIAGNOSIS — Z4801 Encounter for change or removal of surgical wound dressing: Secondary | ICD-10-CM | POA: Diagnosis not present

## 2019-03-15 DIAGNOSIS — T86821 Skin graft (allograft) (autograft) failure: Secondary | ICD-10-CM | POA: Diagnosis not present

## 2019-03-15 DIAGNOSIS — I872 Venous insufficiency (chronic) (peripheral): Secondary | ICD-10-CM | POA: Diagnosis not present

## 2019-03-15 DIAGNOSIS — Z7901 Long term (current) use of anticoagulants: Secondary | ICD-10-CM | POA: Diagnosis not present

## 2019-03-18 DIAGNOSIS — L97929 Non-pressure chronic ulcer of unspecified part of left lower leg with unspecified severity: Secondary | ICD-10-CM | POA: Diagnosis not present

## 2019-03-18 DIAGNOSIS — Z4801 Encounter for change or removal of surgical wound dressing: Secondary | ICD-10-CM | POA: Diagnosis not present

## 2019-03-18 DIAGNOSIS — T86821 Skin graft (allograft) (autograft) failure: Secondary | ICD-10-CM | POA: Diagnosis not present

## 2019-03-18 DIAGNOSIS — I872 Venous insufficiency (chronic) (peripheral): Secondary | ICD-10-CM | POA: Diagnosis not present

## 2019-03-18 DIAGNOSIS — Z7901 Long term (current) use of anticoagulants: Secondary | ICD-10-CM | POA: Diagnosis not present

## 2019-03-19 DIAGNOSIS — I890 Lymphedema, not elsewhere classified: Secondary | ICD-10-CM | POA: Diagnosis not present

## 2019-03-19 DIAGNOSIS — L97928 Non-pressure chronic ulcer of unspecified part of left lower leg with other specified severity: Secondary | ICD-10-CM | POA: Diagnosis not present

## 2019-03-19 DIAGNOSIS — I739 Peripheral vascular disease, unspecified: Secondary | ICD-10-CM | POA: Diagnosis not present

## 2019-03-19 DIAGNOSIS — L03116 Cellulitis of left lower limb: Secondary | ICD-10-CM | POA: Diagnosis not present

## 2019-03-20 DIAGNOSIS — T86821 Skin graft (allograft) (autograft) failure: Secondary | ICD-10-CM | POA: Diagnosis not present

## 2019-03-20 DIAGNOSIS — Z4801 Encounter for change or removal of surgical wound dressing: Secondary | ICD-10-CM | POA: Diagnosis not present

## 2019-03-20 DIAGNOSIS — I872 Venous insufficiency (chronic) (peripheral): Secondary | ICD-10-CM | POA: Diagnosis not present

## 2019-03-20 DIAGNOSIS — L97929 Non-pressure chronic ulcer of unspecified part of left lower leg with unspecified severity: Secondary | ICD-10-CM | POA: Diagnosis not present

## 2019-03-20 DIAGNOSIS — Z7901 Long term (current) use of anticoagulants: Secondary | ICD-10-CM | POA: Diagnosis not present

## 2019-03-22 DIAGNOSIS — L97929 Non-pressure chronic ulcer of unspecified part of left lower leg with unspecified severity: Secondary | ICD-10-CM | POA: Diagnosis not present

## 2019-03-22 DIAGNOSIS — Z7901 Long term (current) use of anticoagulants: Secondary | ICD-10-CM | POA: Diagnosis not present

## 2019-03-22 DIAGNOSIS — Z4801 Encounter for change or removal of surgical wound dressing: Secondary | ICD-10-CM | POA: Diagnosis not present

## 2019-03-22 DIAGNOSIS — T86821 Skin graft (allograft) (autograft) failure: Secondary | ICD-10-CM | POA: Diagnosis not present

## 2019-03-22 DIAGNOSIS — I872 Venous insufficiency (chronic) (peripheral): Secondary | ICD-10-CM | POA: Diagnosis not present

## 2019-03-25 DIAGNOSIS — L97929 Non-pressure chronic ulcer of unspecified part of left lower leg with unspecified severity: Secondary | ICD-10-CM | POA: Diagnosis not present

## 2019-03-25 DIAGNOSIS — I872 Venous insufficiency (chronic) (peripheral): Secondary | ICD-10-CM | POA: Diagnosis not present

## 2019-03-25 DIAGNOSIS — Z7901 Long term (current) use of anticoagulants: Secondary | ICD-10-CM | POA: Diagnosis not present

## 2019-03-25 DIAGNOSIS — Z4801 Encounter for change or removal of surgical wound dressing: Secondary | ICD-10-CM | POA: Diagnosis not present

## 2019-03-25 DIAGNOSIS — T86821 Skin graft (allograft) (autograft) failure: Secondary | ICD-10-CM | POA: Diagnosis not present

## 2019-03-27 DIAGNOSIS — Z4801 Encounter for change or removal of surgical wound dressing: Secondary | ICD-10-CM | POA: Diagnosis not present

## 2019-03-27 DIAGNOSIS — Z7901 Long term (current) use of anticoagulants: Secondary | ICD-10-CM | POA: Diagnosis not present

## 2019-03-27 DIAGNOSIS — L97929 Non-pressure chronic ulcer of unspecified part of left lower leg with unspecified severity: Secondary | ICD-10-CM | POA: Diagnosis not present

## 2019-03-27 DIAGNOSIS — T86821 Skin graft (allograft) (autograft) failure: Secondary | ICD-10-CM | POA: Diagnosis not present

## 2019-03-27 DIAGNOSIS — I872 Venous insufficiency (chronic) (peripheral): Secondary | ICD-10-CM | POA: Diagnosis not present

## 2019-03-29 DIAGNOSIS — T86821 Skin graft (allograft) (autograft) failure: Secondary | ICD-10-CM | POA: Diagnosis not present

## 2019-03-29 DIAGNOSIS — Z7901 Long term (current) use of anticoagulants: Secondary | ICD-10-CM | POA: Diagnosis not present

## 2019-03-29 DIAGNOSIS — L97929 Non-pressure chronic ulcer of unspecified part of left lower leg with unspecified severity: Secondary | ICD-10-CM | POA: Diagnosis not present

## 2019-03-29 DIAGNOSIS — Z4801 Encounter for change or removal of surgical wound dressing: Secondary | ICD-10-CM | POA: Diagnosis not present

## 2019-03-29 DIAGNOSIS — I872 Venous insufficiency (chronic) (peripheral): Secondary | ICD-10-CM | POA: Diagnosis not present

## 2019-04-01 DIAGNOSIS — T86821 Skin graft (allograft) (autograft) failure: Secondary | ICD-10-CM | POA: Diagnosis not present

## 2019-04-01 DIAGNOSIS — L97929 Non-pressure chronic ulcer of unspecified part of left lower leg with unspecified severity: Secondary | ICD-10-CM | POA: Diagnosis not present

## 2019-04-01 DIAGNOSIS — I872 Venous insufficiency (chronic) (peripheral): Secondary | ICD-10-CM | POA: Diagnosis not present

## 2019-04-01 DIAGNOSIS — Z4801 Encounter for change or removal of surgical wound dressing: Secondary | ICD-10-CM | POA: Diagnosis not present

## 2019-04-01 DIAGNOSIS — Z7901 Long term (current) use of anticoagulants: Secondary | ICD-10-CM | POA: Diagnosis not present

## 2019-04-03 DIAGNOSIS — Z4801 Encounter for change or removal of surgical wound dressing: Secondary | ICD-10-CM | POA: Diagnosis not present

## 2019-04-03 DIAGNOSIS — T86821 Skin graft (allograft) (autograft) failure: Secondary | ICD-10-CM | POA: Diagnosis not present

## 2019-04-03 DIAGNOSIS — Z7901 Long term (current) use of anticoagulants: Secondary | ICD-10-CM | POA: Diagnosis not present

## 2019-04-03 DIAGNOSIS — I872 Venous insufficiency (chronic) (peripheral): Secondary | ICD-10-CM | POA: Diagnosis not present

## 2019-04-03 DIAGNOSIS — L97929 Non-pressure chronic ulcer of unspecified part of left lower leg with unspecified severity: Secondary | ICD-10-CM | POA: Diagnosis not present

## 2019-04-04 DIAGNOSIS — T86821 Skin graft (allograft) (autograft) failure: Secondary | ICD-10-CM | POA: Diagnosis not present

## 2019-04-04 DIAGNOSIS — I872 Venous insufficiency (chronic) (peripheral): Secondary | ICD-10-CM | POA: Diagnosis not present

## 2019-04-04 DIAGNOSIS — L97929 Non-pressure chronic ulcer of unspecified part of left lower leg with unspecified severity: Secondary | ICD-10-CM | POA: Diagnosis not present

## 2019-04-04 DIAGNOSIS — Z4801 Encounter for change or removal of surgical wound dressing: Secondary | ICD-10-CM | POA: Diagnosis not present

## 2019-04-04 DIAGNOSIS — Z7901 Long term (current) use of anticoagulants: Secondary | ICD-10-CM | POA: Diagnosis not present

## 2019-04-05 DIAGNOSIS — I739 Peripheral vascular disease, unspecified: Secondary | ICD-10-CM | POA: Diagnosis not present

## 2019-04-05 DIAGNOSIS — Z8614 Personal history of Methicillin resistant Staphylococcus aureus infection: Secondary | ICD-10-CM | POA: Diagnosis not present

## 2019-04-05 DIAGNOSIS — L97928 Non-pressure chronic ulcer of unspecified part of left lower leg with other specified severity: Secondary | ICD-10-CM | POA: Diagnosis not present

## 2019-04-05 DIAGNOSIS — Z4800 Encounter for change or removal of nonsurgical wound dressing: Secondary | ICD-10-CM | POA: Diagnosis not present

## 2019-04-05 DIAGNOSIS — L97929 Non-pressure chronic ulcer of unspecified part of left lower leg with unspecified severity: Secondary | ICD-10-CM | POA: Diagnosis not present

## 2019-04-05 DIAGNOSIS — L03116 Cellulitis of left lower limb: Secondary | ICD-10-CM | POA: Diagnosis not present

## 2019-04-08 DIAGNOSIS — T86821 Skin graft (allograft) (autograft) failure: Secondary | ICD-10-CM | POA: Diagnosis not present

## 2019-04-08 DIAGNOSIS — I872 Venous insufficiency (chronic) (peripheral): Secondary | ICD-10-CM | POA: Diagnosis not present

## 2019-04-08 DIAGNOSIS — Z4801 Encounter for change or removal of surgical wound dressing: Secondary | ICD-10-CM | POA: Diagnosis not present

## 2019-04-08 DIAGNOSIS — Z7901 Long term (current) use of anticoagulants: Secondary | ICD-10-CM | POA: Diagnosis not present

## 2019-04-08 DIAGNOSIS — L97929 Non-pressure chronic ulcer of unspecified part of left lower leg with unspecified severity: Secondary | ICD-10-CM | POA: Diagnosis not present

## 2019-04-10 DIAGNOSIS — I872 Venous insufficiency (chronic) (peripheral): Secondary | ICD-10-CM | POA: Diagnosis not present

## 2019-04-10 DIAGNOSIS — Z7901 Long term (current) use of anticoagulants: Secondary | ICD-10-CM | POA: Diagnosis not present

## 2019-04-10 DIAGNOSIS — L97929 Non-pressure chronic ulcer of unspecified part of left lower leg with unspecified severity: Secondary | ICD-10-CM | POA: Diagnosis not present

## 2019-04-10 DIAGNOSIS — Z4801 Encounter for change or removal of surgical wound dressing: Secondary | ICD-10-CM | POA: Diagnosis not present

## 2019-04-10 DIAGNOSIS — T86821 Skin graft (allograft) (autograft) failure: Secondary | ICD-10-CM | POA: Diagnosis not present

## 2019-04-12 DIAGNOSIS — T86821 Skin graft (allograft) (autograft) failure: Secondary | ICD-10-CM | POA: Diagnosis not present

## 2019-04-12 DIAGNOSIS — Z7901 Long term (current) use of anticoagulants: Secondary | ICD-10-CM | POA: Diagnosis not present

## 2019-04-12 DIAGNOSIS — I872 Venous insufficiency (chronic) (peripheral): Secondary | ICD-10-CM | POA: Diagnosis not present

## 2019-04-12 DIAGNOSIS — L97929 Non-pressure chronic ulcer of unspecified part of left lower leg with unspecified severity: Secondary | ICD-10-CM | POA: Diagnosis not present

## 2019-04-12 DIAGNOSIS — Z4801 Encounter for change or removal of surgical wound dressing: Secondary | ICD-10-CM | POA: Diagnosis not present

## 2019-04-15 DIAGNOSIS — I872 Venous insufficiency (chronic) (peripheral): Secondary | ICD-10-CM | POA: Diagnosis not present

## 2019-04-15 DIAGNOSIS — L97929 Non-pressure chronic ulcer of unspecified part of left lower leg with unspecified severity: Secondary | ICD-10-CM | POA: Diagnosis not present

## 2019-04-15 DIAGNOSIS — Z7901 Long term (current) use of anticoagulants: Secondary | ICD-10-CM | POA: Diagnosis not present

## 2019-04-15 DIAGNOSIS — Z4801 Encounter for change or removal of surgical wound dressing: Secondary | ICD-10-CM | POA: Diagnosis not present

## 2019-04-15 DIAGNOSIS — T86821 Skin graft (allograft) (autograft) failure: Secondary | ICD-10-CM | POA: Diagnosis not present

## 2019-04-17 DIAGNOSIS — I872 Venous insufficiency (chronic) (peripheral): Secondary | ICD-10-CM | POA: Diagnosis not present

## 2019-04-17 DIAGNOSIS — Z7901 Long term (current) use of anticoagulants: Secondary | ICD-10-CM | POA: Diagnosis not present

## 2019-04-17 DIAGNOSIS — L97929 Non-pressure chronic ulcer of unspecified part of left lower leg with unspecified severity: Secondary | ICD-10-CM | POA: Diagnosis not present

## 2019-04-17 DIAGNOSIS — T86821 Skin graft (allograft) (autograft) failure: Secondary | ICD-10-CM | POA: Diagnosis not present

## 2019-04-17 DIAGNOSIS — Z4801 Encounter for change or removal of surgical wound dressing: Secondary | ICD-10-CM | POA: Diagnosis not present

## 2019-04-19 DIAGNOSIS — Z4801 Encounter for change or removal of surgical wound dressing: Secondary | ICD-10-CM | POA: Diagnosis not present

## 2019-04-19 DIAGNOSIS — T86821 Skin graft (allograft) (autograft) failure: Secondary | ICD-10-CM | POA: Diagnosis not present

## 2019-04-19 DIAGNOSIS — Z7901 Long term (current) use of anticoagulants: Secondary | ICD-10-CM | POA: Diagnosis not present

## 2019-04-19 DIAGNOSIS — L97929 Non-pressure chronic ulcer of unspecified part of left lower leg with unspecified severity: Secondary | ICD-10-CM | POA: Diagnosis not present

## 2019-04-19 DIAGNOSIS — I872 Venous insufficiency (chronic) (peripheral): Secondary | ICD-10-CM | POA: Diagnosis not present

## 2019-04-21 DIAGNOSIS — I872 Venous insufficiency (chronic) (peripheral): Secondary | ICD-10-CM | POA: Diagnosis not present

## 2019-04-21 DIAGNOSIS — L97929 Non-pressure chronic ulcer of unspecified part of left lower leg with unspecified severity: Secondary | ICD-10-CM | POA: Diagnosis not present

## 2019-04-21 DIAGNOSIS — Z4801 Encounter for change or removal of surgical wound dressing: Secondary | ICD-10-CM | POA: Diagnosis not present

## 2019-04-21 DIAGNOSIS — T86821 Skin graft (allograft) (autograft) failure: Secondary | ICD-10-CM | POA: Diagnosis not present

## 2019-04-21 DIAGNOSIS — Z7901 Long term (current) use of anticoagulants: Secondary | ICD-10-CM | POA: Diagnosis not present

## 2019-04-22 DIAGNOSIS — Z4801 Encounter for change or removal of surgical wound dressing: Secondary | ICD-10-CM | POA: Diagnosis not present

## 2019-04-22 DIAGNOSIS — T86821 Skin graft (allograft) (autograft) failure: Secondary | ICD-10-CM | POA: Diagnosis not present

## 2019-04-22 DIAGNOSIS — Z7901 Long term (current) use of anticoagulants: Secondary | ICD-10-CM | POA: Diagnosis not present

## 2019-04-22 DIAGNOSIS — I872 Venous insufficiency (chronic) (peripheral): Secondary | ICD-10-CM | POA: Diagnosis not present

## 2019-04-22 DIAGNOSIS — L97929 Non-pressure chronic ulcer of unspecified part of left lower leg with unspecified severity: Secondary | ICD-10-CM | POA: Diagnosis not present

## 2019-04-24 DIAGNOSIS — L97929 Non-pressure chronic ulcer of unspecified part of left lower leg with unspecified severity: Secondary | ICD-10-CM | POA: Diagnosis not present

## 2019-04-24 DIAGNOSIS — Z7901 Long term (current) use of anticoagulants: Secondary | ICD-10-CM | POA: Diagnosis not present

## 2019-04-24 DIAGNOSIS — I872 Venous insufficiency (chronic) (peripheral): Secondary | ICD-10-CM | POA: Diagnosis not present

## 2019-04-24 DIAGNOSIS — Z4801 Encounter for change or removal of surgical wound dressing: Secondary | ICD-10-CM | POA: Diagnosis not present

## 2019-04-24 DIAGNOSIS — T86821 Skin graft (allograft) (autograft) failure: Secondary | ICD-10-CM | POA: Diagnosis not present

## 2019-04-26 DIAGNOSIS — Z7901 Long term (current) use of anticoagulants: Secondary | ICD-10-CM | POA: Diagnosis not present

## 2019-04-26 DIAGNOSIS — I872 Venous insufficiency (chronic) (peripheral): Secondary | ICD-10-CM | POA: Diagnosis not present

## 2019-04-26 DIAGNOSIS — T86821 Skin graft (allograft) (autograft) failure: Secondary | ICD-10-CM | POA: Diagnosis not present

## 2019-04-26 DIAGNOSIS — Z4801 Encounter for change or removal of surgical wound dressing: Secondary | ICD-10-CM | POA: Diagnosis not present

## 2019-04-26 DIAGNOSIS — L97929 Non-pressure chronic ulcer of unspecified part of left lower leg with unspecified severity: Secondary | ICD-10-CM | POA: Diagnosis not present

## 2019-04-29 DIAGNOSIS — Z4801 Encounter for change or removal of surgical wound dressing: Secondary | ICD-10-CM | POA: Diagnosis not present

## 2019-04-29 DIAGNOSIS — Z7901 Long term (current) use of anticoagulants: Secondary | ICD-10-CM | POA: Diagnosis not present

## 2019-04-29 DIAGNOSIS — T86821 Skin graft (allograft) (autograft) failure: Secondary | ICD-10-CM | POA: Diagnosis not present

## 2019-04-29 DIAGNOSIS — L97929 Non-pressure chronic ulcer of unspecified part of left lower leg with unspecified severity: Secondary | ICD-10-CM | POA: Diagnosis not present

## 2019-04-29 DIAGNOSIS — I872 Venous insufficiency (chronic) (peripheral): Secondary | ICD-10-CM | POA: Diagnosis not present

## 2019-05-01 DIAGNOSIS — I872 Venous insufficiency (chronic) (peripheral): Secondary | ICD-10-CM | POA: Diagnosis not present

## 2019-05-01 DIAGNOSIS — T86821 Skin graft (allograft) (autograft) failure: Secondary | ICD-10-CM | POA: Diagnosis not present

## 2019-05-01 DIAGNOSIS — Z4801 Encounter for change or removal of surgical wound dressing: Secondary | ICD-10-CM | POA: Diagnosis not present

## 2019-05-01 DIAGNOSIS — Z7901 Long term (current) use of anticoagulants: Secondary | ICD-10-CM | POA: Diagnosis not present

## 2019-05-01 DIAGNOSIS — L97929 Non-pressure chronic ulcer of unspecified part of left lower leg with unspecified severity: Secondary | ICD-10-CM | POA: Diagnosis not present

## 2019-05-03 DIAGNOSIS — L97928 Non-pressure chronic ulcer of unspecified part of left lower leg with other specified severity: Secondary | ICD-10-CM | POA: Diagnosis not present

## 2019-05-03 DIAGNOSIS — S81802A Unspecified open wound, left lower leg, initial encounter: Secondary | ICD-10-CM | POA: Diagnosis not present

## 2019-05-03 DIAGNOSIS — I2510 Atherosclerotic heart disease of native coronary artery without angina pectoris: Secondary | ICD-10-CM | POA: Diagnosis not present

## 2019-05-03 DIAGNOSIS — Z8614 Personal history of Methicillin resistant Staphylococcus aureus infection: Secondary | ICD-10-CM | POA: Diagnosis not present

## 2019-05-03 DIAGNOSIS — L03116 Cellulitis of left lower limb: Secondary | ICD-10-CM | POA: Diagnosis not present

## 2019-05-03 DIAGNOSIS — Z4800 Encounter for change or removal of nonsurgical wound dressing: Secondary | ICD-10-CM | POA: Diagnosis not present

## 2019-05-03 DIAGNOSIS — I739 Peripheral vascular disease, unspecified: Secondary | ICD-10-CM | POA: Diagnosis not present

## 2019-05-06 DIAGNOSIS — Z7901 Long term (current) use of anticoagulants: Secondary | ICD-10-CM | POA: Diagnosis not present

## 2019-05-06 DIAGNOSIS — L97929 Non-pressure chronic ulcer of unspecified part of left lower leg with unspecified severity: Secondary | ICD-10-CM | POA: Diagnosis not present

## 2019-05-06 DIAGNOSIS — Z4801 Encounter for change or removal of surgical wound dressing: Secondary | ICD-10-CM | POA: Diagnosis not present

## 2019-05-06 DIAGNOSIS — I872 Venous insufficiency (chronic) (peripheral): Secondary | ICD-10-CM | POA: Diagnosis not present

## 2019-05-06 DIAGNOSIS — T86821 Skin graft (allograft) (autograft) failure: Secondary | ICD-10-CM | POA: Diagnosis not present

## 2019-05-08 DIAGNOSIS — Z4801 Encounter for change or removal of surgical wound dressing: Secondary | ICD-10-CM | POA: Diagnosis not present

## 2019-05-08 DIAGNOSIS — L97929 Non-pressure chronic ulcer of unspecified part of left lower leg with unspecified severity: Secondary | ICD-10-CM | POA: Diagnosis not present

## 2019-05-08 DIAGNOSIS — I872 Venous insufficiency (chronic) (peripheral): Secondary | ICD-10-CM | POA: Diagnosis not present

## 2019-05-08 DIAGNOSIS — T86821 Skin graft (allograft) (autograft) failure: Secondary | ICD-10-CM | POA: Diagnosis not present

## 2019-05-08 DIAGNOSIS — Z7901 Long term (current) use of anticoagulants: Secondary | ICD-10-CM | POA: Diagnosis not present

## 2019-05-10 DIAGNOSIS — L97929 Non-pressure chronic ulcer of unspecified part of left lower leg with unspecified severity: Secondary | ICD-10-CM | POA: Diagnosis not present

## 2019-05-10 DIAGNOSIS — Z7901 Long term (current) use of anticoagulants: Secondary | ICD-10-CM | POA: Diagnosis not present

## 2019-05-10 DIAGNOSIS — Z4801 Encounter for change or removal of surgical wound dressing: Secondary | ICD-10-CM | POA: Diagnosis not present

## 2019-05-10 DIAGNOSIS — I872 Venous insufficiency (chronic) (peripheral): Secondary | ICD-10-CM | POA: Diagnosis not present

## 2019-05-10 DIAGNOSIS — T86821 Skin graft (allograft) (autograft) failure: Secondary | ICD-10-CM | POA: Diagnosis not present

## 2019-05-13 DIAGNOSIS — Z7901 Long term (current) use of anticoagulants: Secondary | ICD-10-CM | POA: Diagnosis not present

## 2019-05-13 DIAGNOSIS — I872 Venous insufficiency (chronic) (peripheral): Secondary | ICD-10-CM | POA: Diagnosis not present

## 2019-05-13 DIAGNOSIS — Z4801 Encounter for change or removal of surgical wound dressing: Secondary | ICD-10-CM | POA: Diagnosis not present

## 2019-05-13 DIAGNOSIS — L97929 Non-pressure chronic ulcer of unspecified part of left lower leg with unspecified severity: Secondary | ICD-10-CM | POA: Diagnosis not present

## 2019-05-13 DIAGNOSIS — T86821 Skin graft (allograft) (autograft) failure: Secondary | ICD-10-CM | POA: Diagnosis not present

## 2019-05-17 DIAGNOSIS — I872 Venous insufficiency (chronic) (peripheral): Secondary | ICD-10-CM | POA: Diagnosis not present

## 2019-05-17 DIAGNOSIS — Z7901 Long term (current) use of anticoagulants: Secondary | ICD-10-CM | POA: Diagnosis not present

## 2019-05-17 DIAGNOSIS — Z4801 Encounter for change or removal of surgical wound dressing: Secondary | ICD-10-CM | POA: Diagnosis not present

## 2019-05-17 DIAGNOSIS — T86821 Skin graft (allograft) (autograft) failure: Secondary | ICD-10-CM | POA: Diagnosis not present

## 2019-05-17 DIAGNOSIS — L97929 Non-pressure chronic ulcer of unspecified part of left lower leg with unspecified severity: Secondary | ICD-10-CM | POA: Diagnosis not present

## 2019-05-20 DIAGNOSIS — Z7901 Long term (current) use of anticoagulants: Secondary | ICD-10-CM | POA: Diagnosis not present

## 2019-05-20 DIAGNOSIS — L97929 Non-pressure chronic ulcer of unspecified part of left lower leg with unspecified severity: Secondary | ICD-10-CM | POA: Diagnosis not present

## 2019-05-20 DIAGNOSIS — Z4801 Encounter for change or removal of surgical wound dressing: Secondary | ICD-10-CM | POA: Diagnosis not present

## 2019-05-20 DIAGNOSIS — T86821 Skin graft (allograft) (autograft) failure: Secondary | ICD-10-CM | POA: Diagnosis not present

## 2019-05-20 DIAGNOSIS — I872 Venous insufficiency (chronic) (peripheral): Secondary | ICD-10-CM | POA: Diagnosis not present

## 2019-05-21 DIAGNOSIS — T86821 Skin graft (allograft) (autograft) failure: Secondary | ICD-10-CM | POA: Diagnosis not present

## 2019-05-23 DIAGNOSIS — L03119 Cellulitis of unspecified part of limb: Secondary | ICD-10-CM | POA: Diagnosis not present

## 2019-05-23 DIAGNOSIS — I739 Peripheral vascular disease, unspecified: Secondary | ICD-10-CM | POA: Diagnosis not present

## 2019-05-24 DIAGNOSIS — Z4800 Encounter for change or removal of nonsurgical wound dressing: Secondary | ICD-10-CM | POA: Diagnosis not present

## 2019-05-24 DIAGNOSIS — L03116 Cellulitis of left lower limb: Secondary | ICD-10-CM | POA: Diagnosis not present

## 2019-05-24 DIAGNOSIS — L97928 Non-pressure chronic ulcer of unspecified part of left lower leg with other specified severity: Secondary | ICD-10-CM | POA: Diagnosis not present

## 2019-05-24 DIAGNOSIS — I739 Peripheral vascular disease, unspecified: Secondary | ICD-10-CM | POA: Diagnosis not present

## 2019-05-24 DIAGNOSIS — Z8614 Personal history of Methicillin resistant Staphylococcus aureus infection: Secondary | ICD-10-CM | POA: Diagnosis not present

## 2019-06-05 DIAGNOSIS — E1122 Type 2 diabetes mellitus with diabetic chronic kidney disease: Secondary | ICD-10-CM | POA: Diagnosis not present

## 2019-06-05 DIAGNOSIS — E1142 Type 2 diabetes mellitus with diabetic polyneuropathy: Secondary | ICD-10-CM | POA: Diagnosis not present

## 2019-06-05 DIAGNOSIS — I10 Essential (primary) hypertension: Secondary | ICD-10-CM | POA: Diagnosis not present

## 2019-06-05 DIAGNOSIS — Z6829 Body mass index (BMI) 29.0-29.9, adult: Secondary | ICD-10-CM | POA: Diagnosis not present

## 2019-06-05 DIAGNOSIS — Z0001 Encounter for general adult medical examination with abnormal findings: Secondary | ICD-10-CM | POA: Diagnosis not present

## 2019-06-05 DIAGNOSIS — M353 Polymyalgia rheumatica: Secondary | ICD-10-CM | POA: Diagnosis not present

## 2019-06-05 DIAGNOSIS — D692 Other nonthrombocytopenic purpura: Secondary | ICD-10-CM | POA: Diagnosis not present

## 2019-06-07 DIAGNOSIS — L03116 Cellulitis of left lower limb: Secondary | ICD-10-CM | POA: Diagnosis not present

## 2019-06-07 DIAGNOSIS — L97919 Non-pressure chronic ulcer of unspecified part of right lower leg with unspecified severity: Secondary | ICD-10-CM | POA: Diagnosis not present

## 2019-06-07 DIAGNOSIS — I890 Lymphedema, not elsewhere classified: Secondary | ICD-10-CM | POA: Diagnosis not present

## 2019-06-07 DIAGNOSIS — Z4800 Encounter for change or removal of nonsurgical wound dressing: Secondary | ICD-10-CM | POA: Diagnosis not present

## 2019-06-07 DIAGNOSIS — L97928 Non-pressure chronic ulcer of unspecified part of left lower leg with other specified severity: Secondary | ICD-10-CM | POA: Diagnosis not present

## 2019-06-07 DIAGNOSIS — I739 Peripheral vascular disease, unspecified: Secondary | ICD-10-CM | POA: Diagnosis not present

## 2019-06-08 DIAGNOSIS — Z86718 Personal history of other venous thrombosis and embolism: Secondary | ICD-10-CM | POA: Diagnosis not present

## 2019-06-08 DIAGNOSIS — I4891 Unspecified atrial fibrillation: Secondary | ICD-10-CM | POA: Diagnosis not present

## 2019-06-08 DIAGNOSIS — E1122 Type 2 diabetes mellitus with diabetic chronic kidney disease: Secondary | ICD-10-CM | POA: Diagnosis not present

## 2019-06-08 DIAGNOSIS — Z87891 Personal history of nicotine dependence: Secondary | ICD-10-CM | POA: Diagnosis not present

## 2019-06-08 DIAGNOSIS — N1832 Chronic kidney disease, stage 3b: Secondary | ICD-10-CM | POA: Diagnosis not present

## 2019-06-08 DIAGNOSIS — L98499 Non-pressure chronic ulcer of skin of other sites with unspecified severity: Secondary | ICD-10-CM | POA: Diagnosis not present

## 2019-06-08 DIAGNOSIS — L97828 Non-pressure chronic ulcer of other part of left lower leg with other specified severity: Secondary | ICD-10-CM | POA: Diagnosis not present

## 2019-06-08 DIAGNOSIS — I739 Peripheral vascular disease, unspecified: Secondary | ICD-10-CM | POA: Diagnosis not present

## 2019-06-10 DIAGNOSIS — M7989 Other specified soft tissue disorders: Secondary | ICD-10-CM | POA: Diagnosis not present

## 2019-06-10 DIAGNOSIS — M79662 Pain in left lower leg: Secondary | ICD-10-CM | POA: Diagnosis not present

## 2019-06-10 DIAGNOSIS — R600 Localized edema: Secondary | ICD-10-CM | POA: Diagnosis not present

## 2019-06-20 DIAGNOSIS — T86821 Skin graft (allograft) (autograft) failure: Secondary | ICD-10-CM | POA: Diagnosis not present

## 2019-06-28 DIAGNOSIS — I739 Peripheral vascular disease, unspecified: Secondary | ICD-10-CM | POA: Diagnosis not present

## 2019-06-28 DIAGNOSIS — I890 Lymphedema, not elsewhere classified: Secondary | ICD-10-CM | POA: Diagnosis not present

## 2019-06-28 DIAGNOSIS — Z4800 Encounter for change or removal of nonsurgical wound dressing: Secondary | ICD-10-CM | POA: Diagnosis not present

## 2019-06-28 DIAGNOSIS — L03116 Cellulitis of left lower limb: Secondary | ICD-10-CM | POA: Diagnosis not present

## 2019-06-28 DIAGNOSIS — L97928 Non-pressure chronic ulcer of unspecified part of left lower leg with other specified severity: Secondary | ICD-10-CM | POA: Diagnosis not present

## 2019-07-15 DIAGNOSIS — C490 Malignant neoplasm of connective and soft tissue of head, face and neck: Secondary | ICD-10-CM | POA: Diagnosis not present

## 2019-07-20 DIAGNOSIS — T86821 Skin graft (allograft) (autograft) failure: Secondary | ICD-10-CM | POA: Diagnosis not present

## 2019-07-20 DIAGNOSIS — Z7901 Long term (current) use of anticoagulants: Secondary | ICD-10-CM | POA: Diagnosis not present

## 2019-07-20 DIAGNOSIS — L97229 Non-pressure chronic ulcer of left calf with unspecified severity: Secondary | ICD-10-CM | POA: Diagnosis not present

## 2019-07-20 DIAGNOSIS — I872 Venous insufficiency (chronic) (peripheral): Secondary | ICD-10-CM | POA: Diagnosis not present

## 2019-07-20 DIAGNOSIS — L97929 Non-pressure chronic ulcer of unspecified part of left lower leg with unspecified severity: Secondary | ICD-10-CM | POA: Diagnosis not present

## 2019-07-20 DIAGNOSIS — Z4801 Encounter for change or removal of surgical wound dressing: Secondary | ICD-10-CM | POA: Diagnosis not present

## 2019-07-21 DIAGNOSIS — L97229 Non-pressure chronic ulcer of left calf with unspecified severity: Secondary | ICD-10-CM | POA: Diagnosis not present

## 2019-07-21 DIAGNOSIS — T86821 Skin graft (allograft) (autograft) failure: Secondary | ICD-10-CM | POA: Diagnosis not present

## 2019-07-21 DIAGNOSIS — Z7901 Long term (current) use of anticoagulants: Secondary | ICD-10-CM | POA: Diagnosis not present

## 2019-07-21 DIAGNOSIS — I872 Venous insufficiency (chronic) (peripheral): Secondary | ICD-10-CM | POA: Diagnosis not present

## 2019-07-21 DIAGNOSIS — L97929 Non-pressure chronic ulcer of unspecified part of left lower leg with unspecified severity: Secondary | ICD-10-CM | POA: Diagnosis not present

## 2019-07-21 DIAGNOSIS — Z4801 Encounter for change or removal of surgical wound dressing: Secondary | ICD-10-CM | POA: Diagnosis not present

## 2019-07-23 DIAGNOSIS — L97929 Non-pressure chronic ulcer of unspecified part of left lower leg with unspecified severity: Secondary | ICD-10-CM | POA: Diagnosis not present

## 2019-07-23 DIAGNOSIS — Z7901 Long term (current) use of anticoagulants: Secondary | ICD-10-CM | POA: Diagnosis not present

## 2019-07-23 DIAGNOSIS — I872 Venous insufficiency (chronic) (peripheral): Secondary | ICD-10-CM | POA: Diagnosis not present

## 2019-07-23 DIAGNOSIS — T86821 Skin graft (allograft) (autograft) failure: Secondary | ICD-10-CM | POA: Diagnosis not present

## 2019-07-23 DIAGNOSIS — L97229 Non-pressure chronic ulcer of left calf with unspecified severity: Secondary | ICD-10-CM | POA: Diagnosis not present

## 2019-07-23 DIAGNOSIS — Z4801 Encounter for change or removal of surgical wound dressing: Secondary | ICD-10-CM | POA: Diagnosis not present

## 2019-07-25 DIAGNOSIS — I872 Venous insufficiency (chronic) (peripheral): Secondary | ICD-10-CM | POA: Diagnosis not present

## 2019-07-25 DIAGNOSIS — L97229 Non-pressure chronic ulcer of left calf with unspecified severity: Secondary | ICD-10-CM | POA: Diagnosis not present

## 2019-07-25 DIAGNOSIS — Z4801 Encounter for change or removal of surgical wound dressing: Secondary | ICD-10-CM | POA: Diagnosis not present

## 2019-07-25 DIAGNOSIS — T86821 Skin graft (allograft) (autograft) failure: Secondary | ICD-10-CM | POA: Diagnosis not present

## 2019-07-25 DIAGNOSIS — Z7901 Long term (current) use of anticoagulants: Secondary | ICD-10-CM | POA: Diagnosis not present

## 2019-07-25 DIAGNOSIS — L97929 Non-pressure chronic ulcer of unspecified part of left lower leg with unspecified severity: Secondary | ICD-10-CM | POA: Diagnosis not present

## 2019-07-30 DIAGNOSIS — Z4801 Encounter for change or removal of surgical wound dressing: Secondary | ICD-10-CM | POA: Diagnosis not present

## 2019-07-30 DIAGNOSIS — I872 Venous insufficiency (chronic) (peripheral): Secondary | ICD-10-CM | POA: Diagnosis not present

## 2019-07-30 DIAGNOSIS — Z7901 Long term (current) use of anticoagulants: Secondary | ICD-10-CM | POA: Diagnosis not present

## 2019-07-30 DIAGNOSIS — L97229 Non-pressure chronic ulcer of left calf with unspecified severity: Secondary | ICD-10-CM | POA: Diagnosis not present

## 2019-07-30 DIAGNOSIS — L97929 Non-pressure chronic ulcer of unspecified part of left lower leg with unspecified severity: Secondary | ICD-10-CM | POA: Diagnosis not present

## 2019-07-30 DIAGNOSIS — T86821 Skin graft (allograft) (autograft) failure: Secondary | ICD-10-CM | POA: Diagnosis not present

## 2019-08-02 DIAGNOSIS — T86821 Skin graft (allograft) (autograft) failure: Secondary | ICD-10-CM | POA: Diagnosis not present

## 2019-08-02 DIAGNOSIS — L97229 Non-pressure chronic ulcer of left calf with unspecified severity: Secondary | ICD-10-CM | POA: Diagnosis not present

## 2019-08-02 DIAGNOSIS — Z7901 Long term (current) use of anticoagulants: Secondary | ICD-10-CM | POA: Diagnosis not present

## 2019-08-02 DIAGNOSIS — L97929 Non-pressure chronic ulcer of unspecified part of left lower leg with unspecified severity: Secondary | ICD-10-CM | POA: Diagnosis not present

## 2019-08-02 DIAGNOSIS — I872 Venous insufficiency (chronic) (peripheral): Secondary | ICD-10-CM | POA: Diagnosis not present

## 2019-08-02 DIAGNOSIS — Z4801 Encounter for change or removal of surgical wound dressing: Secondary | ICD-10-CM | POA: Diagnosis not present

## 2019-08-05 DIAGNOSIS — Z4801 Encounter for change or removal of surgical wound dressing: Secondary | ICD-10-CM | POA: Diagnosis not present

## 2019-08-05 DIAGNOSIS — I872 Venous insufficiency (chronic) (peripheral): Secondary | ICD-10-CM | POA: Diagnosis not present

## 2019-08-05 DIAGNOSIS — L97929 Non-pressure chronic ulcer of unspecified part of left lower leg with unspecified severity: Secondary | ICD-10-CM | POA: Diagnosis not present

## 2019-08-05 DIAGNOSIS — L97229 Non-pressure chronic ulcer of left calf with unspecified severity: Secondary | ICD-10-CM | POA: Diagnosis not present

## 2019-08-05 DIAGNOSIS — Z7901 Long term (current) use of anticoagulants: Secondary | ICD-10-CM | POA: Diagnosis not present

## 2019-08-05 DIAGNOSIS — T86821 Skin graft (allograft) (autograft) failure: Secondary | ICD-10-CM | POA: Diagnosis not present

## 2019-08-07 DIAGNOSIS — L97929 Non-pressure chronic ulcer of unspecified part of left lower leg with unspecified severity: Secondary | ICD-10-CM | POA: Diagnosis not present

## 2019-08-07 DIAGNOSIS — Z4801 Encounter for change or removal of surgical wound dressing: Secondary | ICD-10-CM | POA: Diagnosis not present

## 2019-08-07 DIAGNOSIS — Z7901 Long term (current) use of anticoagulants: Secondary | ICD-10-CM | POA: Diagnosis not present

## 2019-08-07 DIAGNOSIS — I872 Venous insufficiency (chronic) (peripheral): Secondary | ICD-10-CM | POA: Diagnosis not present

## 2019-08-07 DIAGNOSIS — T86821 Skin graft (allograft) (autograft) failure: Secondary | ICD-10-CM | POA: Diagnosis not present

## 2019-08-07 DIAGNOSIS — L97229 Non-pressure chronic ulcer of left calf with unspecified severity: Secondary | ICD-10-CM | POA: Diagnosis not present

## 2019-08-09 DIAGNOSIS — I83022 Varicose veins of left lower extremity with ulcer of calf: Secondary | ICD-10-CM | POA: Diagnosis not present

## 2019-08-09 DIAGNOSIS — L97909 Non-pressure chronic ulcer of unspecified part of unspecified lower leg with unspecified severity: Secondary | ICD-10-CM | POA: Diagnosis not present

## 2019-08-09 DIAGNOSIS — L97222 Non-pressure chronic ulcer of left calf with fat layer exposed: Secondary | ICD-10-CM | POA: Diagnosis not present

## 2019-08-09 DIAGNOSIS — I70299 Other atherosclerosis of native arteries of extremities, unspecified extremity: Secondary | ICD-10-CM | POA: Diagnosis not present

## 2019-08-12 DIAGNOSIS — T86821 Skin graft (allograft) (autograft) failure: Secondary | ICD-10-CM | POA: Diagnosis not present

## 2019-08-12 DIAGNOSIS — L97229 Non-pressure chronic ulcer of left calf with unspecified severity: Secondary | ICD-10-CM | POA: Diagnosis not present

## 2019-08-12 DIAGNOSIS — Z4801 Encounter for change or removal of surgical wound dressing: Secondary | ICD-10-CM | POA: Diagnosis not present

## 2019-08-12 DIAGNOSIS — Z7901 Long term (current) use of anticoagulants: Secondary | ICD-10-CM | POA: Diagnosis not present

## 2019-08-12 DIAGNOSIS — I872 Venous insufficiency (chronic) (peripheral): Secondary | ICD-10-CM | POA: Diagnosis not present

## 2019-08-12 DIAGNOSIS — L97929 Non-pressure chronic ulcer of unspecified part of left lower leg with unspecified severity: Secondary | ICD-10-CM | POA: Diagnosis not present

## 2019-08-14 DIAGNOSIS — T86821 Skin graft (allograft) (autograft) failure: Secondary | ICD-10-CM | POA: Diagnosis not present

## 2019-08-14 DIAGNOSIS — I872 Venous insufficiency (chronic) (peripheral): Secondary | ICD-10-CM | POA: Diagnosis not present

## 2019-08-14 DIAGNOSIS — L97929 Non-pressure chronic ulcer of unspecified part of left lower leg with unspecified severity: Secondary | ICD-10-CM | POA: Diagnosis not present

## 2019-08-14 DIAGNOSIS — Z4801 Encounter for change or removal of surgical wound dressing: Secondary | ICD-10-CM | POA: Diagnosis not present

## 2019-08-14 DIAGNOSIS — Z7901 Long term (current) use of anticoagulants: Secondary | ICD-10-CM | POA: Diagnosis not present

## 2019-08-14 DIAGNOSIS — L97229 Non-pressure chronic ulcer of left calf with unspecified severity: Secondary | ICD-10-CM | POA: Diagnosis not present

## 2019-08-16 DIAGNOSIS — L97229 Non-pressure chronic ulcer of left calf with unspecified severity: Secondary | ICD-10-CM | POA: Diagnosis not present

## 2019-08-16 DIAGNOSIS — T86821 Skin graft (allograft) (autograft) failure: Secondary | ICD-10-CM | POA: Diagnosis not present

## 2019-08-16 DIAGNOSIS — I872 Venous insufficiency (chronic) (peripheral): Secondary | ICD-10-CM | POA: Diagnosis not present

## 2019-08-16 DIAGNOSIS — L97929 Non-pressure chronic ulcer of unspecified part of left lower leg with unspecified severity: Secondary | ICD-10-CM | POA: Diagnosis not present

## 2019-08-16 DIAGNOSIS — Z4801 Encounter for change or removal of surgical wound dressing: Secondary | ICD-10-CM | POA: Diagnosis not present

## 2019-08-16 DIAGNOSIS — Z7901 Long term (current) use of anticoagulants: Secondary | ICD-10-CM | POA: Diagnosis not present

## 2019-08-19 DIAGNOSIS — Z4801 Encounter for change or removal of surgical wound dressing: Secondary | ICD-10-CM | POA: Diagnosis not present

## 2019-08-19 DIAGNOSIS — L97229 Non-pressure chronic ulcer of left calf with unspecified severity: Secondary | ICD-10-CM | POA: Diagnosis not present

## 2019-08-19 DIAGNOSIS — Z7901 Long term (current) use of anticoagulants: Secondary | ICD-10-CM | POA: Diagnosis not present

## 2019-08-19 DIAGNOSIS — L97929 Non-pressure chronic ulcer of unspecified part of left lower leg with unspecified severity: Secondary | ICD-10-CM | POA: Diagnosis not present

## 2019-08-19 DIAGNOSIS — T86821 Skin graft (allograft) (autograft) failure: Secondary | ICD-10-CM | POA: Diagnosis not present

## 2019-08-19 DIAGNOSIS — I872 Venous insufficiency (chronic) (peripheral): Secondary | ICD-10-CM | POA: Diagnosis not present

## 2019-08-21 DIAGNOSIS — L97929 Non-pressure chronic ulcer of unspecified part of left lower leg with unspecified severity: Secondary | ICD-10-CM | POA: Diagnosis not present

## 2019-08-21 DIAGNOSIS — Z7901 Long term (current) use of anticoagulants: Secondary | ICD-10-CM | POA: Diagnosis not present

## 2019-08-21 DIAGNOSIS — I872 Venous insufficiency (chronic) (peripheral): Secondary | ICD-10-CM | POA: Diagnosis not present

## 2019-08-21 DIAGNOSIS — T86821 Skin graft (allograft) (autograft) failure: Secondary | ICD-10-CM | POA: Diagnosis not present

## 2019-08-21 DIAGNOSIS — L97229 Non-pressure chronic ulcer of left calf with unspecified severity: Secondary | ICD-10-CM | POA: Diagnosis not present

## 2019-08-21 DIAGNOSIS — Z4801 Encounter for change or removal of surgical wound dressing: Secondary | ICD-10-CM | POA: Diagnosis not present

## 2019-08-23 DIAGNOSIS — T86821 Skin graft (allograft) (autograft) failure: Secondary | ICD-10-CM | POA: Diagnosis not present

## 2019-08-23 DIAGNOSIS — I872 Venous insufficiency (chronic) (peripheral): Secondary | ICD-10-CM | POA: Diagnosis not present

## 2019-08-23 DIAGNOSIS — L97229 Non-pressure chronic ulcer of left calf with unspecified severity: Secondary | ICD-10-CM | POA: Diagnosis not present

## 2019-08-23 DIAGNOSIS — L97929 Non-pressure chronic ulcer of unspecified part of left lower leg with unspecified severity: Secondary | ICD-10-CM | POA: Diagnosis not present

## 2019-08-23 DIAGNOSIS — Z7901 Long term (current) use of anticoagulants: Secondary | ICD-10-CM | POA: Diagnosis not present

## 2019-08-23 DIAGNOSIS — Z4801 Encounter for change or removal of surgical wound dressing: Secondary | ICD-10-CM | POA: Diagnosis not present

## 2019-08-26 DIAGNOSIS — Z4801 Encounter for change or removal of surgical wound dressing: Secondary | ICD-10-CM | POA: Diagnosis not present

## 2019-08-26 DIAGNOSIS — L97929 Non-pressure chronic ulcer of unspecified part of left lower leg with unspecified severity: Secondary | ICD-10-CM | POA: Diagnosis not present

## 2019-08-26 DIAGNOSIS — L97229 Non-pressure chronic ulcer of left calf with unspecified severity: Secondary | ICD-10-CM | POA: Diagnosis not present

## 2019-08-26 DIAGNOSIS — I872 Venous insufficiency (chronic) (peripheral): Secondary | ICD-10-CM | POA: Diagnosis not present

## 2019-08-26 DIAGNOSIS — T86821 Skin graft (allograft) (autograft) failure: Secondary | ICD-10-CM | POA: Diagnosis not present

## 2019-08-26 DIAGNOSIS — Z7901 Long term (current) use of anticoagulants: Secondary | ICD-10-CM | POA: Diagnosis not present

## 2019-08-28 DIAGNOSIS — Z4801 Encounter for change or removal of surgical wound dressing: Secondary | ICD-10-CM | POA: Diagnosis not present

## 2019-08-28 DIAGNOSIS — Z7901 Long term (current) use of anticoagulants: Secondary | ICD-10-CM | POA: Diagnosis not present

## 2019-08-28 DIAGNOSIS — I872 Venous insufficiency (chronic) (peripheral): Secondary | ICD-10-CM | POA: Diagnosis not present

## 2019-08-28 DIAGNOSIS — L97929 Non-pressure chronic ulcer of unspecified part of left lower leg with unspecified severity: Secondary | ICD-10-CM | POA: Diagnosis not present

## 2019-08-28 DIAGNOSIS — T86821 Skin graft (allograft) (autograft) failure: Secondary | ICD-10-CM | POA: Diagnosis not present

## 2019-08-28 DIAGNOSIS — L97229 Non-pressure chronic ulcer of left calf with unspecified severity: Secondary | ICD-10-CM | POA: Diagnosis not present

## 2019-08-30 DIAGNOSIS — Z86718 Personal history of other venous thrombosis and embolism: Secondary | ICD-10-CM | POA: Diagnosis not present

## 2019-08-30 DIAGNOSIS — L97909 Non-pressure chronic ulcer of unspecified part of unspecified lower leg with unspecified severity: Secondary | ICD-10-CM | POA: Diagnosis not present

## 2019-08-30 DIAGNOSIS — I2510 Atherosclerotic heart disease of native coronary artery without angina pectoris: Secondary | ICD-10-CM | POA: Diagnosis not present

## 2019-08-30 DIAGNOSIS — Z79899 Other long term (current) drug therapy: Secondary | ICD-10-CM | POA: Diagnosis not present

## 2019-08-30 DIAGNOSIS — Z87891 Personal history of nicotine dependence: Secondary | ICD-10-CM | POA: Diagnosis not present

## 2019-08-30 DIAGNOSIS — E11622 Type 2 diabetes mellitus with other skin ulcer: Secondary | ICD-10-CM | POA: Diagnosis not present

## 2019-08-30 DIAGNOSIS — E785 Hyperlipidemia, unspecified: Secondary | ICD-10-CM | POA: Diagnosis not present

## 2019-08-30 DIAGNOSIS — N183 Chronic kidney disease, stage 3 unspecified: Secondary | ICD-10-CM | POA: Diagnosis not present

## 2019-08-30 DIAGNOSIS — E1122 Type 2 diabetes mellitus with diabetic chronic kidney disease: Secondary | ICD-10-CM | POA: Diagnosis not present

## 2019-08-30 DIAGNOSIS — I480 Paroxysmal atrial fibrillation: Secondary | ICD-10-CM | POA: Diagnosis not present

## 2019-08-30 DIAGNOSIS — F329 Major depressive disorder, single episode, unspecified: Secondary | ICD-10-CM | POA: Diagnosis not present

## 2019-08-30 DIAGNOSIS — E1151 Type 2 diabetes mellitus with diabetic peripheral angiopathy without gangrene: Secondary | ICD-10-CM | POA: Diagnosis not present

## 2019-08-30 DIAGNOSIS — Z9049 Acquired absence of other specified parts of digestive tract: Secondary | ICD-10-CM | POA: Diagnosis not present

## 2019-08-30 DIAGNOSIS — I129 Hypertensive chronic kidney disease with stage 1 through stage 4 chronic kidney disease, or unspecified chronic kidney disease: Secondary | ICD-10-CM | POA: Diagnosis not present

## 2019-08-30 DIAGNOSIS — Z8673 Personal history of transient ischemic attack (TIA), and cerebral infarction without residual deficits: Secondary | ICD-10-CM | POA: Diagnosis not present

## 2019-08-30 DIAGNOSIS — I878 Other specified disorders of veins: Secondary | ICD-10-CM | POA: Diagnosis not present

## 2019-08-30 DIAGNOSIS — Z7901 Long term (current) use of anticoagulants: Secondary | ICD-10-CM | POA: Diagnosis not present

## 2019-08-30 DIAGNOSIS — I70299 Other atherosclerosis of native arteries of extremities, unspecified extremity: Secondary | ICD-10-CM | POA: Diagnosis not present

## 2019-08-30 DIAGNOSIS — L97821 Non-pressure chronic ulcer of other part of left lower leg limited to breakdown of skin: Secondary | ICD-10-CM | POA: Diagnosis not present

## 2019-09-03 DIAGNOSIS — L97229 Non-pressure chronic ulcer of left calf with unspecified severity: Secondary | ICD-10-CM | POA: Diagnosis not present

## 2019-09-03 DIAGNOSIS — I872 Venous insufficiency (chronic) (peripheral): Secondary | ICD-10-CM | POA: Diagnosis not present

## 2019-09-03 DIAGNOSIS — T86821 Skin graft (allograft) (autograft) failure: Secondary | ICD-10-CM | POA: Diagnosis not present

## 2019-09-03 DIAGNOSIS — L97929 Non-pressure chronic ulcer of unspecified part of left lower leg with unspecified severity: Secondary | ICD-10-CM | POA: Diagnosis not present

## 2019-09-03 DIAGNOSIS — Z4801 Encounter for change or removal of surgical wound dressing: Secondary | ICD-10-CM | POA: Diagnosis not present

## 2019-09-03 DIAGNOSIS — Z7901 Long term (current) use of anticoagulants: Secondary | ICD-10-CM | POA: Diagnosis not present

## 2019-09-04 DIAGNOSIS — Z7901 Long term (current) use of anticoagulants: Secondary | ICD-10-CM | POA: Diagnosis not present

## 2019-09-04 DIAGNOSIS — L97229 Non-pressure chronic ulcer of left calf with unspecified severity: Secondary | ICD-10-CM | POA: Diagnosis not present

## 2019-09-04 DIAGNOSIS — I872 Venous insufficiency (chronic) (peripheral): Secondary | ICD-10-CM | POA: Diagnosis not present

## 2019-09-04 DIAGNOSIS — T86821 Skin graft (allograft) (autograft) failure: Secondary | ICD-10-CM | POA: Diagnosis not present

## 2019-09-04 DIAGNOSIS — Z4801 Encounter for change or removal of surgical wound dressing: Secondary | ICD-10-CM | POA: Diagnosis not present

## 2019-09-04 DIAGNOSIS — L97929 Non-pressure chronic ulcer of unspecified part of left lower leg with unspecified severity: Secondary | ICD-10-CM | POA: Diagnosis not present

## 2019-09-06 DIAGNOSIS — L97229 Non-pressure chronic ulcer of left calf with unspecified severity: Secondary | ICD-10-CM | POA: Diagnosis not present

## 2019-09-06 DIAGNOSIS — Z7901 Long term (current) use of anticoagulants: Secondary | ICD-10-CM | POA: Diagnosis not present

## 2019-09-06 DIAGNOSIS — I872 Venous insufficiency (chronic) (peripheral): Secondary | ICD-10-CM | POA: Diagnosis not present

## 2019-09-06 DIAGNOSIS — Z4801 Encounter for change or removal of surgical wound dressing: Secondary | ICD-10-CM | POA: Diagnosis not present

## 2019-09-06 DIAGNOSIS — L97929 Non-pressure chronic ulcer of unspecified part of left lower leg with unspecified severity: Secondary | ICD-10-CM | POA: Diagnosis not present

## 2019-09-06 DIAGNOSIS — T86821 Skin graft (allograft) (autograft) failure: Secondary | ICD-10-CM | POA: Diagnosis not present

## 2019-09-09 DIAGNOSIS — Z4801 Encounter for change or removal of surgical wound dressing: Secondary | ICD-10-CM | POA: Diagnosis not present

## 2019-09-09 DIAGNOSIS — T86821 Skin graft (allograft) (autograft) failure: Secondary | ICD-10-CM | POA: Diagnosis not present

## 2019-09-09 DIAGNOSIS — L97229 Non-pressure chronic ulcer of left calf with unspecified severity: Secondary | ICD-10-CM | POA: Diagnosis not present

## 2019-09-09 DIAGNOSIS — I872 Venous insufficiency (chronic) (peripheral): Secondary | ICD-10-CM | POA: Diagnosis not present

## 2019-09-09 DIAGNOSIS — Z7901 Long term (current) use of anticoagulants: Secondary | ICD-10-CM | POA: Diagnosis not present

## 2019-09-09 DIAGNOSIS — L97929 Non-pressure chronic ulcer of unspecified part of left lower leg with unspecified severity: Secondary | ICD-10-CM | POA: Diagnosis not present

## 2019-09-11 DIAGNOSIS — I872 Venous insufficiency (chronic) (peripheral): Secondary | ICD-10-CM | POA: Diagnosis not present

## 2019-09-11 DIAGNOSIS — Z4801 Encounter for change or removal of surgical wound dressing: Secondary | ICD-10-CM | POA: Diagnosis not present

## 2019-09-11 DIAGNOSIS — T86821 Skin graft (allograft) (autograft) failure: Secondary | ICD-10-CM | POA: Diagnosis not present

## 2019-09-11 DIAGNOSIS — L97929 Non-pressure chronic ulcer of unspecified part of left lower leg with unspecified severity: Secondary | ICD-10-CM | POA: Diagnosis not present

## 2019-09-11 DIAGNOSIS — Z7901 Long term (current) use of anticoagulants: Secondary | ICD-10-CM | POA: Diagnosis not present

## 2019-09-11 DIAGNOSIS — L97229 Non-pressure chronic ulcer of left calf with unspecified severity: Secondary | ICD-10-CM | POA: Diagnosis not present

## 2019-09-12 DIAGNOSIS — L97229 Non-pressure chronic ulcer of left calf with unspecified severity: Secondary | ICD-10-CM | POA: Diagnosis not present

## 2019-09-12 DIAGNOSIS — I872 Venous insufficiency (chronic) (peripheral): Secondary | ICD-10-CM | POA: Diagnosis not present

## 2019-09-12 DIAGNOSIS — T86821 Skin graft (allograft) (autograft) failure: Secondary | ICD-10-CM | POA: Diagnosis not present

## 2019-09-12 DIAGNOSIS — L97929 Non-pressure chronic ulcer of unspecified part of left lower leg with unspecified severity: Secondary | ICD-10-CM | POA: Diagnosis not present

## 2019-09-12 DIAGNOSIS — Z7901 Long term (current) use of anticoagulants: Secondary | ICD-10-CM | POA: Diagnosis not present

## 2019-09-12 DIAGNOSIS — Z4801 Encounter for change or removal of surgical wound dressing: Secondary | ICD-10-CM | POA: Diagnosis not present

## 2019-09-16 DIAGNOSIS — L97229 Non-pressure chronic ulcer of left calf with unspecified severity: Secondary | ICD-10-CM | POA: Diagnosis not present

## 2019-09-16 DIAGNOSIS — T86821 Skin graft (allograft) (autograft) failure: Secondary | ICD-10-CM | POA: Diagnosis not present

## 2019-09-16 DIAGNOSIS — L97929 Non-pressure chronic ulcer of unspecified part of left lower leg with unspecified severity: Secondary | ICD-10-CM | POA: Diagnosis not present

## 2019-09-16 DIAGNOSIS — I872 Venous insufficiency (chronic) (peripheral): Secondary | ICD-10-CM | POA: Diagnosis not present

## 2019-09-16 DIAGNOSIS — Z4801 Encounter for change or removal of surgical wound dressing: Secondary | ICD-10-CM | POA: Diagnosis not present

## 2019-09-16 DIAGNOSIS — Z7901 Long term (current) use of anticoagulants: Secondary | ICD-10-CM | POA: Diagnosis not present

## 2019-09-18 DIAGNOSIS — Z7901 Long term (current) use of anticoagulants: Secondary | ICD-10-CM | POA: Diagnosis not present

## 2019-09-18 DIAGNOSIS — T86821 Skin graft (allograft) (autograft) failure: Secondary | ICD-10-CM | POA: Diagnosis not present

## 2019-09-18 DIAGNOSIS — I872 Venous insufficiency (chronic) (peripheral): Secondary | ICD-10-CM | POA: Diagnosis not present

## 2019-09-18 DIAGNOSIS — L97229 Non-pressure chronic ulcer of left calf with unspecified severity: Secondary | ICD-10-CM | POA: Diagnosis not present

## 2019-09-18 DIAGNOSIS — Z4801 Encounter for change or removal of surgical wound dressing: Secondary | ICD-10-CM | POA: Diagnosis not present

## 2019-09-18 DIAGNOSIS — L97929 Non-pressure chronic ulcer of unspecified part of left lower leg with unspecified severity: Secondary | ICD-10-CM | POA: Diagnosis not present

## 2019-09-24 DIAGNOSIS — Z7901 Long term (current) use of anticoagulants: Secondary | ICD-10-CM | POA: Diagnosis not present

## 2019-09-24 DIAGNOSIS — E785 Hyperlipidemia, unspecified: Secondary | ICD-10-CM | POA: Diagnosis not present

## 2019-09-24 DIAGNOSIS — E1122 Type 2 diabetes mellitus with diabetic chronic kidney disease: Secondary | ICD-10-CM | POA: Diagnosis not present

## 2019-09-24 DIAGNOSIS — Z87442 Personal history of urinary calculi: Secondary | ICD-10-CM | POA: Diagnosis not present

## 2019-09-24 DIAGNOSIS — Z87891 Personal history of nicotine dependence: Secondary | ICD-10-CM | POA: Diagnosis not present

## 2019-09-24 DIAGNOSIS — Z8673 Personal history of transient ischemic attack (TIA), and cerebral infarction without residual deficits: Secondary | ICD-10-CM | POA: Diagnosis not present

## 2019-09-24 DIAGNOSIS — Z4801 Encounter for change or removal of surgical wound dressing: Secondary | ICD-10-CM | POA: Diagnosis not present

## 2019-09-24 DIAGNOSIS — N183 Chronic kidney disease, stage 3 unspecified: Secondary | ICD-10-CM | POA: Diagnosis not present

## 2019-09-24 DIAGNOSIS — T86821 Skin graft (allograft) (autograft) failure: Secondary | ICD-10-CM | POA: Diagnosis not present

## 2019-09-24 DIAGNOSIS — F329 Major depressive disorder, single episode, unspecified: Secondary | ICD-10-CM | POA: Diagnosis not present

## 2019-09-24 DIAGNOSIS — I83022 Varicose veins of left lower extremity with ulcer of calf: Secondary | ICD-10-CM | POA: Diagnosis not present

## 2019-09-24 DIAGNOSIS — I480 Paroxysmal atrial fibrillation: Secondary | ICD-10-CM | POA: Diagnosis not present

## 2019-09-24 DIAGNOSIS — L97222 Non-pressure chronic ulcer of left calf with fat layer exposed: Secondary | ICD-10-CM | POA: Diagnosis not present

## 2019-09-24 DIAGNOSIS — E1151 Type 2 diabetes mellitus with diabetic peripheral angiopathy without gangrene: Secondary | ICD-10-CM | POA: Diagnosis not present

## 2019-09-24 DIAGNOSIS — I129 Hypertensive chronic kidney disease with stage 1 through stage 4 chronic kidney disease, or unspecified chronic kidney disease: Secondary | ICD-10-CM | POA: Diagnosis not present

## 2019-09-24 DIAGNOSIS — Z79899 Other long term (current) drug therapy: Secondary | ICD-10-CM | POA: Diagnosis not present

## 2019-09-24 DIAGNOSIS — Z9660 Presence of unspecified orthopedic joint implant: Secondary | ICD-10-CM | POA: Diagnosis not present

## 2019-09-24 DIAGNOSIS — I872 Venous insufficiency (chronic) (peripheral): Secondary | ICD-10-CM | POA: Diagnosis not present

## 2019-09-24 DIAGNOSIS — Z9049 Acquired absence of other specified parts of digestive tract: Secondary | ICD-10-CM | POA: Diagnosis not present

## 2019-09-24 DIAGNOSIS — L97929 Non-pressure chronic ulcer of unspecified part of left lower leg with unspecified severity: Secondary | ICD-10-CM | POA: Diagnosis not present

## 2019-09-24 DIAGNOSIS — Z86718 Personal history of other venous thrombosis and embolism: Secondary | ICD-10-CM | POA: Diagnosis not present

## 2019-09-24 DIAGNOSIS — I83029 Varicose veins of left lower extremity with ulcer of unspecified site: Secondary | ICD-10-CM | POA: Diagnosis not present

## 2019-09-24 DIAGNOSIS — I2510 Atherosclerotic heart disease of native coronary artery without angina pectoris: Secondary | ICD-10-CM | POA: Diagnosis not present

## 2019-09-24 DIAGNOSIS — L97229 Non-pressure chronic ulcer of left calf with unspecified severity: Secondary | ICD-10-CM | POA: Diagnosis not present

## 2019-09-27 DIAGNOSIS — L97929 Non-pressure chronic ulcer of unspecified part of left lower leg with unspecified severity: Secondary | ICD-10-CM | POA: Diagnosis not present

## 2019-09-27 DIAGNOSIS — T86821 Skin graft (allograft) (autograft) failure: Secondary | ICD-10-CM | POA: Diagnosis not present

## 2019-09-27 DIAGNOSIS — L97229 Non-pressure chronic ulcer of left calf with unspecified severity: Secondary | ICD-10-CM | POA: Diagnosis not present

## 2019-09-27 DIAGNOSIS — I872 Venous insufficiency (chronic) (peripheral): Secondary | ICD-10-CM | POA: Diagnosis not present

## 2019-09-27 DIAGNOSIS — Z4801 Encounter for change or removal of surgical wound dressing: Secondary | ICD-10-CM | POA: Diagnosis not present

## 2019-09-27 DIAGNOSIS — Z7901 Long term (current) use of anticoagulants: Secondary | ICD-10-CM | POA: Diagnosis not present

## 2019-09-28 DIAGNOSIS — L03039 Cellulitis of unspecified toe: Secondary | ICD-10-CM | POA: Diagnosis not present

## 2019-09-28 DIAGNOSIS — Z6828 Body mass index (BMI) 28.0-28.9, adult: Secondary | ICD-10-CM | POA: Diagnosis not present

## 2019-09-30 DIAGNOSIS — L97929 Non-pressure chronic ulcer of unspecified part of left lower leg with unspecified severity: Secondary | ICD-10-CM | POA: Diagnosis not present

## 2019-09-30 DIAGNOSIS — I872 Venous insufficiency (chronic) (peripheral): Secondary | ICD-10-CM | POA: Diagnosis not present

## 2019-09-30 DIAGNOSIS — T86821 Skin graft (allograft) (autograft) failure: Secondary | ICD-10-CM | POA: Diagnosis not present

## 2019-09-30 DIAGNOSIS — Z7901 Long term (current) use of anticoagulants: Secondary | ICD-10-CM | POA: Diagnosis not present

## 2019-09-30 DIAGNOSIS — Z4801 Encounter for change or removal of surgical wound dressing: Secondary | ICD-10-CM | POA: Diagnosis not present

## 2019-09-30 DIAGNOSIS — L97229 Non-pressure chronic ulcer of left calf with unspecified severity: Secondary | ICD-10-CM | POA: Diagnosis not present

## 2019-10-04 DIAGNOSIS — L97929 Non-pressure chronic ulcer of unspecified part of left lower leg with unspecified severity: Secondary | ICD-10-CM | POA: Diagnosis not present

## 2019-10-04 DIAGNOSIS — T86821 Skin graft (allograft) (autograft) failure: Secondary | ICD-10-CM | POA: Diagnosis not present

## 2019-10-04 DIAGNOSIS — Z7901 Long term (current) use of anticoagulants: Secondary | ICD-10-CM | POA: Diagnosis not present

## 2019-10-04 DIAGNOSIS — Z4801 Encounter for change or removal of surgical wound dressing: Secondary | ICD-10-CM | POA: Diagnosis not present

## 2019-10-04 DIAGNOSIS — I872 Venous insufficiency (chronic) (peripheral): Secondary | ICD-10-CM | POA: Diagnosis not present

## 2019-10-04 DIAGNOSIS — L97229 Non-pressure chronic ulcer of left calf with unspecified severity: Secondary | ICD-10-CM | POA: Diagnosis not present

## 2019-10-08 DIAGNOSIS — E785 Hyperlipidemia, unspecified: Secondary | ICD-10-CM | POA: Diagnosis not present

## 2019-10-08 DIAGNOSIS — N183 Chronic kidney disease, stage 3 unspecified: Secondary | ICD-10-CM | POA: Diagnosis not present

## 2019-10-08 DIAGNOSIS — L97222 Non-pressure chronic ulcer of left calf with fat layer exposed: Secondary | ICD-10-CM | POA: Diagnosis not present

## 2019-10-08 DIAGNOSIS — E1122 Type 2 diabetes mellitus with diabetic chronic kidney disease: Secondary | ICD-10-CM | POA: Diagnosis not present

## 2019-10-08 DIAGNOSIS — I129 Hypertensive chronic kidney disease with stage 1 through stage 4 chronic kidney disease, or unspecified chronic kidney disease: Secondary | ICD-10-CM | POA: Diagnosis not present

## 2019-10-08 DIAGNOSIS — L97909 Non-pressure chronic ulcer of unspecified part of unspecified lower leg with unspecified severity: Secondary | ICD-10-CM | POA: Diagnosis not present

## 2019-10-08 DIAGNOSIS — I83022 Varicose veins of left lower extremity with ulcer of calf: Secondary | ICD-10-CM | POA: Diagnosis not present

## 2019-10-08 DIAGNOSIS — L97229 Non-pressure chronic ulcer of left calf with unspecified severity: Secondary | ICD-10-CM | POA: Diagnosis not present

## 2019-10-08 DIAGNOSIS — I70299 Other atherosclerosis of native arteries of extremities, unspecified extremity: Secondary | ICD-10-CM | POA: Diagnosis not present

## 2019-10-09 DIAGNOSIS — Z7901 Long term (current) use of anticoagulants: Secondary | ICD-10-CM | POA: Diagnosis not present

## 2019-10-09 DIAGNOSIS — L97929 Non-pressure chronic ulcer of unspecified part of left lower leg with unspecified severity: Secondary | ICD-10-CM | POA: Diagnosis not present

## 2019-10-09 DIAGNOSIS — I872 Venous insufficiency (chronic) (peripheral): Secondary | ICD-10-CM | POA: Diagnosis not present

## 2019-10-09 DIAGNOSIS — Z4801 Encounter for change or removal of surgical wound dressing: Secondary | ICD-10-CM | POA: Diagnosis not present

## 2019-10-09 DIAGNOSIS — L97229 Non-pressure chronic ulcer of left calf with unspecified severity: Secondary | ICD-10-CM | POA: Diagnosis not present

## 2019-10-09 DIAGNOSIS — T86821 Skin graft (allograft) (autograft) failure: Secondary | ICD-10-CM | POA: Diagnosis not present

## 2019-10-11 DIAGNOSIS — Z7901 Long term (current) use of anticoagulants: Secondary | ICD-10-CM | POA: Diagnosis not present

## 2019-10-11 DIAGNOSIS — T86821 Skin graft (allograft) (autograft) failure: Secondary | ICD-10-CM | POA: Diagnosis not present

## 2019-10-11 DIAGNOSIS — L97229 Non-pressure chronic ulcer of left calf with unspecified severity: Secondary | ICD-10-CM | POA: Diagnosis not present

## 2019-10-11 DIAGNOSIS — I872 Venous insufficiency (chronic) (peripheral): Secondary | ICD-10-CM | POA: Diagnosis not present

## 2019-10-11 DIAGNOSIS — Z4801 Encounter for change or removal of surgical wound dressing: Secondary | ICD-10-CM | POA: Diagnosis not present

## 2019-10-11 DIAGNOSIS — L97929 Non-pressure chronic ulcer of unspecified part of left lower leg with unspecified severity: Secondary | ICD-10-CM | POA: Diagnosis not present

## 2019-10-14 DIAGNOSIS — I872 Venous insufficiency (chronic) (peripheral): Secondary | ICD-10-CM | POA: Diagnosis not present

## 2019-10-14 DIAGNOSIS — Z4801 Encounter for change or removal of surgical wound dressing: Secondary | ICD-10-CM | POA: Diagnosis not present

## 2019-10-14 DIAGNOSIS — Z7901 Long term (current) use of anticoagulants: Secondary | ICD-10-CM | POA: Diagnosis not present

## 2019-10-14 DIAGNOSIS — T86821 Skin graft (allograft) (autograft) failure: Secondary | ICD-10-CM | POA: Diagnosis not present

## 2019-10-14 DIAGNOSIS — L97229 Non-pressure chronic ulcer of left calf with unspecified severity: Secondary | ICD-10-CM | POA: Diagnosis not present

## 2019-10-14 DIAGNOSIS — L97929 Non-pressure chronic ulcer of unspecified part of left lower leg with unspecified severity: Secondary | ICD-10-CM | POA: Diagnosis not present

## 2019-10-18 DIAGNOSIS — Z4801 Encounter for change or removal of surgical wound dressing: Secondary | ICD-10-CM | POA: Diagnosis not present

## 2019-10-18 DIAGNOSIS — Z7901 Long term (current) use of anticoagulants: Secondary | ICD-10-CM | POA: Diagnosis not present

## 2019-10-18 DIAGNOSIS — I872 Venous insufficiency (chronic) (peripheral): Secondary | ICD-10-CM | POA: Diagnosis not present

## 2019-10-18 DIAGNOSIS — L97929 Non-pressure chronic ulcer of unspecified part of left lower leg with unspecified severity: Secondary | ICD-10-CM | POA: Diagnosis not present

## 2019-10-18 DIAGNOSIS — T86821 Skin graft (allograft) (autograft) failure: Secondary | ICD-10-CM | POA: Diagnosis not present

## 2019-10-18 DIAGNOSIS — L97229 Non-pressure chronic ulcer of left calf with unspecified severity: Secondary | ICD-10-CM | POA: Diagnosis not present

## 2019-10-21 DIAGNOSIS — Z7901 Long term (current) use of anticoagulants: Secondary | ICD-10-CM | POA: Diagnosis not present

## 2019-10-21 DIAGNOSIS — Z4801 Encounter for change or removal of surgical wound dressing: Secondary | ICD-10-CM | POA: Diagnosis not present

## 2019-10-21 DIAGNOSIS — I872 Venous insufficiency (chronic) (peripheral): Secondary | ICD-10-CM | POA: Diagnosis not present

## 2019-10-21 DIAGNOSIS — L97229 Non-pressure chronic ulcer of left calf with unspecified severity: Secondary | ICD-10-CM | POA: Diagnosis not present

## 2019-10-21 DIAGNOSIS — L97929 Non-pressure chronic ulcer of unspecified part of left lower leg with unspecified severity: Secondary | ICD-10-CM | POA: Diagnosis not present

## 2019-10-21 DIAGNOSIS — T86821 Skin graft (allograft) (autograft) failure: Secondary | ICD-10-CM | POA: Diagnosis not present

## 2019-10-22 DIAGNOSIS — I129 Hypertensive chronic kidney disease with stage 1 through stage 4 chronic kidney disease, or unspecified chronic kidney disease: Secondary | ICD-10-CM | POA: Diagnosis not present

## 2019-10-22 DIAGNOSIS — E785 Hyperlipidemia, unspecified: Secondary | ICD-10-CM | POA: Diagnosis not present

## 2019-10-22 DIAGNOSIS — I83022 Varicose veins of left lower extremity with ulcer of calf: Secondary | ICD-10-CM | POA: Diagnosis not present

## 2019-10-22 DIAGNOSIS — L97929 Non-pressure chronic ulcer of unspecified part of left lower leg with unspecified severity: Secondary | ICD-10-CM | POA: Diagnosis not present

## 2019-10-22 DIAGNOSIS — N183 Chronic kidney disease, stage 3 unspecified: Secondary | ICD-10-CM | POA: Diagnosis not present

## 2019-10-22 DIAGNOSIS — I872 Venous insufficiency (chronic) (peripheral): Secondary | ICD-10-CM | POA: Diagnosis not present

## 2019-10-22 DIAGNOSIS — L97229 Non-pressure chronic ulcer of left calf with unspecified severity: Secondary | ICD-10-CM | POA: Diagnosis not present

## 2019-10-22 DIAGNOSIS — E1122 Type 2 diabetes mellitus with diabetic chronic kidney disease: Secondary | ICD-10-CM | POA: Diagnosis not present

## 2019-10-25 DIAGNOSIS — L97929 Non-pressure chronic ulcer of unspecified part of left lower leg with unspecified severity: Secondary | ICD-10-CM | POA: Diagnosis not present

## 2019-10-25 DIAGNOSIS — L97229 Non-pressure chronic ulcer of left calf with unspecified severity: Secondary | ICD-10-CM | POA: Diagnosis not present

## 2019-10-25 DIAGNOSIS — T86821 Skin graft (allograft) (autograft) failure: Secondary | ICD-10-CM | POA: Diagnosis not present

## 2019-10-25 DIAGNOSIS — I872 Venous insufficiency (chronic) (peripheral): Secondary | ICD-10-CM | POA: Diagnosis not present

## 2019-10-25 DIAGNOSIS — Z7901 Long term (current) use of anticoagulants: Secondary | ICD-10-CM | POA: Diagnosis not present

## 2019-10-25 DIAGNOSIS — Z4801 Encounter for change or removal of surgical wound dressing: Secondary | ICD-10-CM | POA: Diagnosis not present

## 2019-10-28 DIAGNOSIS — Z4801 Encounter for change or removal of surgical wound dressing: Secondary | ICD-10-CM | POA: Diagnosis not present

## 2019-10-28 DIAGNOSIS — I872 Venous insufficiency (chronic) (peripheral): Secondary | ICD-10-CM | POA: Diagnosis not present

## 2019-10-28 DIAGNOSIS — Z7901 Long term (current) use of anticoagulants: Secondary | ICD-10-CM | POA: Diagnosis not present

## 2019-10-28 DIAGNOSIS — L97929 Non-pressure chronic ulcer of unspecified part of left lower leg with unspecified severity: Secondary | ICD-10-CM | POA: Diagnosis not present

## 2019-10-28 DIAGNOSIS — T86821 Skin graft (allograft) (autograft) failure: Secondary | ICD-10-CM | POA: Diagnosis not present

## 2019-10-28 DIAGNOSIS — L97229 Non-pressure chronic ulcer of left calf with unspecified severity: Secondary | ICD-10-CM | POA: Diagnosis not present

## 2019-11-01 DIAGNOSIS — I872 Venous insufficiency (chronic) (peripheral): Secondary | ICD-10-CM | POA: Diagnosis not present

## 2019-11-01 DIAGNOSIS — T86821 Skin graft (allograft) (autograft) failure: Secondary | ICD-10-CM | POA: Diagnosis not present

## 2019-11-01 DIAGNOSIS — Z4801 Encounter for change or removal of surgical wound dressing: Secondary | ICD-10-CM | POA: Diagnosis not present

## 2019-11-01 DIAGNOSIS — L97929 Non-pressure chronic ulcer of unspecified part of left lower leg with unspecified severity: Secondary | ICD-10-CM | POA: Diagnosis not present

## 2019-11-01 DIAGNOSIS — L97229 Non-pressure chronic ulcer of left calf with unspecified severity: Secondary | ICD-10-CM | POA: Diagnosis not present

## 2019-11-01 DIAGNOSIS — Z7901 Long term (current) use of anticoagulants: Secondary | ICD-10-CM | POA: Diagnosis not present

## 2019-11-05 DIAGNOSIS — T86821 Skin graft (allograft) (autograft) failure: Secondary | ICD-10-CM | POA: Diagnosis not present

## 2019-11-05 DIAGNOSIS — L97929 Non-pressure chronic ulcer of unspecified part of left lower leg with unspecified severity: Secondary | ICD-10-CM | POA: Diagnosis not present

## 2019-11-05 DIAGNOSIS — L97229 Non-pressure chronic ulcer of left calf with unspecified severity: Secondary | ICD-10-CM | POA: Diagnosis not present

## 2019-11-05 DIAGNOSIS — Z4801 Encounter for change or removal of surgical wound dressing: Secondary | ICD-10-CM | POA: Diagnosis not present

## 2019-11-05 DIAGNOSIS — I872 Venous insufficiency (chronic) (peripheral): Secondary | ICD-10-CM | POA: Diagnosis not present

## 2019-11-05 DIAGNOSIS — Z7901 Long term (current) use of anticoagulants: Secondary | ICD-10-CM | POA: Diagnosis not present

## 2019-11-08 DIAGNOSIS — L97229 Non-pressure chronic ulcer of left calf with unspecified severity: Secondary | ICD-10-CM | POA: Diagnosis not present

## 2019-11-08 DIAGNOSIS — T86821 Skin graft (allograft) (autograft) failure: Secondary | ICD-10-CM | POA: Diagnosis not present

## 2019-11-08 DIAGNOSIS — I872 Venous insufficiency (chronic) (peripheral): Secondary | ICD-10-CM | POA: Diagnosis not present

## 2019-11-08 DIAGNOSIS — Z4801 Encounter for change or removal of surgical wound dressing: Secondary | ICD-10-CM | POA: Diagnosis not present

## 2019-11-08 DIAGNOSIS — L97929 Non-pressure chronic ulcer of unspecified part of left lower leg with unspecified severity: Secondary | ICD-10-CM | POA: Diagnosis not present

## 2019-11-08 DIAGNOSIS — Z7901 Long term (current) use of anticoagulants: Secondary | ICD-10-CM | POA: Diagnosis not present

## 2019-11-11 DIAGNOSIS — T86821 Skin graft (allograft) (autograft) failure: Secondary | ICD-10-CM | POA: Diagnosis not present

## 2019-11-11 DIAGNOSIS — Z7901 Long term (current) use of anticoagulants: Secondary | ICD-10-CM | POA: Diagnosis not present

## 2019-11-11 DIAGNOSIS — Z4801 Encounter for change or removal of surgical wound dressing: Secondary | ICD-10-CM | POA: Diagnosis not present

## 2019-11-11 DIAGNOSIS — L97929 Non-pressure chronic ulcer of unspecified part of left lower leg with unspecified severity: Secondary | ICD-10-CM | POA: Diagnosis not present

## 2019-11-11 DIAGNOSIS — L97229 Non-pressure chronic ulcer of left calf with unspecified severity: Secondary | ICD-10-CM | POA: Diagnosis not present

## 2019-11-11 DIAGNOSIS — I872 Venous insufficiency (chronic) (peripheral): Secondary | ICD-10-CM | POA: Diagnosis not present

## 2019-11-12 DIAGNOSIS — Z8673 Personal history of transient ischemic attack (TIA), and cerebral infarction without residual deficits: Secondary | ICD-10-CM | POA: Diagnosis not present

## 2019-11-12 DIAGNOSIS — Z9049 Acquired absence of other specified parts of digestive tract: Secondary | ICD-10-CM | POA: Diagnosis not present

## 2019-11-12 DIAGNOSIS — Z79899 Other long term (current) drug therapy: Secondary | ICD-10-CM | POA: Diagnosis not present

## 2019-11-12 DIAGNOSIS — Z7901 Long term (current) use of anticoagulants: Secondary | ICD-10-CM | POA: Diagnosis not present

## 2019-11-12 DIAGNOSIS — Z86718 Personal history of other venous thrombosis and embolism: Secondary | ICD-10-CM | POA: Diagnosis not present

## 2019-11-12 DIAGNOSIS — I83029 Varicose veins of left lower extremity with ulcer of unspecified site: Secondary | ICD-10-CM | POA: Diagnosis not present

## 2019-11-12 DIAGNOSIS — I480 Paroxysmal atrial fibrillation: Secondary | ICD-10-CM | POA: Diagnosis not present

## 2019-11-12 DIAGNOSIS — I129 Hypertensive chronic kidney disease with stage 1 through stage 4 chronic kidney disease, or unspecified chronic kidney disease: Secondary | ICD-10-CM | POA: Diagnosis not present

## 2019-11-12 DIAGNOSIS — N183 Chronic kidney disease, stage 3 unspecified: Secondary | ICD-10-CM | POA: Diagnosis not present

## 2019-11-12 DIAGNOSIS — I83022 Varicose veins of left lower extremity with ulcer of calf: Secondary | ICD-10-CM | POA: Diagnosis not present

## 2019-11-12 DIAGNOSIS — E1151 Type 2 diabetes mellitus with diabetic peripheral angiopathy without gangrene: Secondary | ICD-10-CM | POA: Diagnosis not present

## 2019-11-12 DIAGNOSIS — E11622 Type 2 diabetes mellitus with other skin ulcer: Secondary | ICD-10-CM | POA: Diagnosis not present

## 2019-11-12 DIAGNOSIS — L97929 Non-pressure chronic ulcer of unspecified part of left lower leg with unspecified severity: Secondary | ICD-10-CM | POA: Diagnosis not present

## 2019-11-12 DIAGNOSIS — E1122 Type 2 diabetes mellitus with diabetic chronic kidney disease: Secondary | ICD-10-CM | POA: Diagnosis not present

## 2019-11-12 DIAGNOSIS — I2510 Atherosclerotic heart disease of native coronary artery without angina pectoris: Secondary | ICD-10-CM | POA: Diagnosis not present

## 2019-11-12 DIAGNOSIS — Z87891 Personal history of nicotine dependence: Secondary | ICD-10-CM | POA: Diagnosis not present

## 2019-11-12 DIAGNOSIS — Z8614 Personal history of Methicillin resistant Staphylococcus aureus infection: Secondary | ICD-10-CM | POA: Diagnosis not present

## 2019-11-12 DIAGNOSIS — L97222 Non-pressure chronic ulcer of left calf with fat layer exposed: Secondary | ICD-10-CM | POA: Diagnosis not present

## 2019-11-15 DIAGNOSIS — T86821 Skin graft (allograft) (autograft) failure: Secondary | ICD-10-CM | POA: Diagnosis not present

## 2019-11-15 DIAGNOSIS — Z7901 Long term (current) use of anticoagulants: Secondary | ICD-10-CM | POA: Diagnosis not present

## 2019-11-15 DIAGNOSIS — Z4801 Encounter for change or removal of surgical wound dressing: Secondary | ICD-10-CM | POA: Diagnosis not present

## 2019-11-15 DIAGNOSIS — L97229 Non-pressure chronic ulcer of left calf with unspecified severity: Secondary | ICD-10-CM | POA: Diagnosis not present

## 2019-11-15 DIAGNOSIS — I872 Venous insufficiency (chronic) (peripheral): Secondary | ICD-10-CM | POA: Diagnosis not present

## 2019-11-15 DIAGNOSIS — L97929 Non-pressure chronic ulcer of unspecified part of left lower leg with unspecified severity: Secondary | ICD-10-CM | POA: Diagnosis not present

## 2019-11-17 DIAGNOSIS — I872 Venous insufficiency (chronic) (peripheral): Secondary | ICD-10-CM | POA: Diagnosis not present

## 2019-11-17 DIAGNOSIS — Z4801 Encounter for change or removal of surgical wound dressing: Secondary | ICD-10-CM | POA: Diagnosis not present

## 2019-11-17 DIAGNOSIS — L97929 Non-pressure chronic ulcer of unspecified part of left lower leg with unspecified severity: Secondary | ICD-10-CM | POA: Diagnosis not present

## 2019-11-17 DIAGNOSIS — L97229 Non-pressure chronic ulcer of left calf with unspecified severity: Secondary | ICD-10-CM | POA: Diagnosis not present

## 2019-11-17 DIAGNOSIS — Z7901 Long term (current) use of anticoagulants: Secondary | ICD-10-CM | POA: Diagnosis not present

## 2019-11-17 DIAGNOSIS — T86821 Skin graft (allograft) (autograft) failure: Secondary | ICD-10-CM | POA: Diagnosis not present

## 2019-11-18 DIAGNOSIS — L97929 Non-pressure chronic ulcer of unspecified part of left lower leg with unspecified severity: Secondary | ICD-10-CM | POA: Diagnosis not present

## 2019-11-18 DIAGNOSIS — T86821 Skin graft (allograft) (autograft) failure: Secondary | ICD-10-CM | POA: Diagnosis not present

## 2019-11-18 DIAGNOSIS — L97229 Non-pressure chronic ulcer of left calf with unspecified severity: Secondary | ICD-10-CM | POA: Diagnosis not present

## 2019-11-18 DIAGNOSIS — I872 Venous insufficiency (chronic) (peripheral): Secondary | ICD-10-CM | POA: Diagnosis not present

## 2019-11-18 DIAGNOSIS — Z7901 Long term (current) use of anticoagulants: Secondary | ICD-10-CM | POA: Diagnosis not present

## 2019-11-18 DIAGNOSIS — Z4801 Encounter for change or removal of surgical wound dressing: Secondary | ICD-10-CM | POA: Diagnosis not present

## 2019-11-21 DIAGNOSIS — Z4801 Encounter for change or removal of surgical wound dressing: Secondary | ICD-10-CM | POA: Diagnosis not present

## 2019-11-21 DIAGNOSIS — I872 Venous insufficiency (chronic) (peripheral): Secondary | ICD-10-CM | POA: Diagnosis not present

## 2019-11-21 DIAGNOSIS — Z7901 Long term (current) use of anticoagulants: Secondary | ICD-10-CM | POA: Diagnosis not present

## 2019-11-21 DIAGNOSIS — L97229 Non-pressure chronic ulcer of left calf with unspecified severity: Secondary | ICD-10-CM | POA: Diagnosis not present

## 2019-11-21 DIAGNOSIS — T86821 Skin graft (allograft) (autograft) failure: Secondary | ICD-10-CM | POA: Diagnosis not present

## 2019-11-21 DIAGNOSIS — L97929 Non-pressure chronic ulcer of unspecified part of left lower leg with unspecified severity: Secondary | ICD-10-CM | POA: Diagnosis not present

## 2019-11-25 DIAGNOSIS — L97929 Non-pressure chronic ulcer of unspecified part of left lower leg with unspecified severity: Secondary | ICD-10-CM | POA: Diagnosis not present

## 2019-11-25 DIAGNOSIS — L97229 Non-pressure chronic ulcer of left calf with unspecified severity: Secondary | ICD-10-CM | POA: Diagnosis not present

## 2019-11-25 DIAGNOSIS — Z4801 Encounter for change or removal of surgical wound dressing: Secondary | ICD-10-CM | POA: Diagnosis not present

## 2019-11-25 DIAGNOSIS — T86821 Skin graft (allograft) (autograft) failure: Secondary | ICD-10-CM | POA: Diagnosis not present

## 2019-11-25 DIAGNOSIS — Z7901 Long term (current) use of anticoagulants: Secondary | ICD-10-CM | POA: Diagnosis not present

## 2019-11-25 DIAGNOSIS — I872 Venous insufficiency (chronic) (peripheral): Secondary | ICD-10-CM | POA: Diagnosis not present

## 2019-11-26 DIAGNOSIS — E1122 Type 2 diabetes mellitus with diabetic chronic kidney disease: Secondary | ICD-10-CM | POA: Diagnosis not present

## 2019-11-26 DIAGNOSIS — N183 Chronic kidney disease, stage 3 unspecified: Secondary | ICD-10-CM | POA: Diagnosis not present

## 2019-11-26 DIAGNOSIS — Z87891 Personal history of nicotine dependence: Secondary | ICD-10-CM | POA: Diagnosis not present

## 2019-11-26 DIAGNOSIS — E1151 Type 2 diabetes mellitus with diabetic peripheral angiopathy without gangrene: Secondary | ICD-10-CM | POA: Diagnosis not present

## 2019-11-26 DIAGNOSIS — E11622 Type 2 diabetes mellitus with other skin ulcer: Secondary | ICD-10-CM | POA: Diagnosis not present

## 2019-11-26 DIAGNOSIS — Z792 Long term (current) use of antibiotics: Secondary | ICD-10-CM | POA: Diagnosis not present

## 2019-11-26 DIAGNOSIS — Z9049 Acquired absence of other specified parts of digestive tract: Secondary | ICD-10-CM | POA: Diagnosis not present

## 2019-11-26 DIAGNOSIS — Z79899 Other long term (current) drug therapy: Secondary | ICD-10-CM | POA: Diagnosis not present

## 2019-11-26 DIAGNOSIS — Z8673 Personal history of transient ischemic attack (TIA), and cerebral infarction without residual deficits: Secondary | ICD-10-CM | POA: Diagnosis not present

## 2019-11-26 DIAGNOSIS — I129 Hypertensive chronic kidney disease with stage 1 through stage 4 chronic kidney disease, or unspecified chronic kidney disease: Secondary | ICD-10-CM | POA: Diagnosis not present

## 2019-11-26 DIAGNOSIS — Z86718 Personal history of other venous thrombosis and embolism: Secondary | ICD-10-CM | POA: Diagnosis not present

## 2019-11-26 DIAGNOSIS — I83022 Varicose veins of left lower extremity with ulcer of calf: Secondary | ICD-10-CM | POA: Diagnosis not present

## 2019-11-26 DIAGNOSIS — L97222 Non-pressure chronic ulcer of left calf with fat layer exposed: Secondary | ICD-10-CM | POA: Diagnosis not present

## 2019-11-26 DIAGNOSIS — L97229 Non-pressure chronic ulcer of left calf with unspecified severity: Secondary | ICD-10-CM | POA: Diagnosis not present

## 2019-11-26 DIAGNOSIS — I2510 Atherosclerotic heart disease of native coronary artery without angina pectoris: Secondary | ICD-10-CM | POA: Diagnosis not present

## 2019-11-26 DIAGNOSIS — E785 Hyperlipidemia, unspecified: Secondary | ICD-10-CM | POA: Diagnosis not present

## 2019-11-26 DIAGNOSIS — F329 Major depressive disorder, single episode, unspecified: Secondary | ICD-10-CM | POA: Diagnosis not present

## 2019-11-29 DIAGNOSIS — Z7901 Long term (current) use of anticoagulants: Secondary | ICD-10-CM | POA: Diagnosis not present

## 2019-11-29 DIAGNOSIS — I872 Venous insufficiency (chronic) (peripheral): Secondary | ICD-10-CM | POA: Diagnosis not present

## 2019-11-29 DIAGNOSIS — L97229 Non-pressure chronic ulcer of left calf with unspecified severity: Secondary | ICD-10-CM | POA: Diagnosis not present

## 2019-11-29 DIAGNOSIS — T86821 Skin graft (allograft) (autograft) failure: Secondary | ICD-10-CM | POA: Diagnosis not present

## 2019-11-29 DIAGNOSIS — L97929 Non-pressure chronic ulcer of unspecified part of left lower leg with unspecified severity: Secondary | ICD-10-CM | POA: Diagnosis not present

## 2019-11-29 DIAGNOSIS — Z4801 Encounter for change or removal of surgical wound dressing: Secondary | ICD-10-CM | POA: Diagnosis not present

## 2019-12-02 DIAGNOSIS — Z4801 Encounter for change or removal of surgical wound dressing: Secondary | ICD-10-CM | POA: Diagnosis not present

## 2019-12-02 DIAGNOSIS — Z7901 Long term (current) use of anticoagulants: Secondary | ICD-10-CM | POA: Diagnosis not present

## 2019-12-02 DIAGNOSIS — I872 Venous insufficiency (chronic) (peripheral): Secondary | ICD-10-CM | POA: Diagnosis not present

## 2019-12-02 DIAGNOSIS — L97929 Non-pressure chronic ulcer of unspecified part of left lower leg with unspecified severity: Secondary | ICD-10-CM | POA: Diagnosis not present

## 2019-12-02 DIAGNOSIS — T86821 Skin graft (allograft) (autograft) failure: Secondary | ICD-10-CM | POA: Diagnosis not present

## 2019-12-02 DIAGNOSIS — L97229 Non-pressure chronic ulcer of left calf with unspecified severity: Secondary | ICD-10-CM | POA: Diagnosis not present

## 2019-12-05 DIAGNOSIS — L97929 Non-pressure chronic ulcer of unspecified part of left lower leg with unspecified severity: Secondary | ICD-10-CM | POA: Diagnosis not present

## 2019-12-05 DIAGNOSIS — Z4801 Encounter for change or removal of surgical wound dressing: Secondary | ICD-10-CM | POA: Diagnosis not present

## 2019-12-05 DIAGNOSIS — T86821 Skin graft (allograft) (autograft) failure: Secondary | ICD-10-CM | POA: Diagnosis not present

## 2019-12-05 DIAGNOSIS — I872 Venous insufficiency (chronic) (peripheral): Secondary | ICD-10-CM | POA: Diagnosis not present

## 2019-12-05 DIAGNOSIS — L97229 Non-pressure chronic ulcer of left calf with unspecified severity: Secondary | ICD-10-CM | POA: Diagnosis not present

## 2019-12-05 DIAGNOSIS — Z7901 Long term (current) use of anticoagulants: Secondary | ICD-10-CM | POA: Diagnosis not present

## 2019-12-10 DIAGNOSIS — T86821 Skin graft (allograft) (autograft) failure: Secondary | ICD-10-CM | POA: Diagnosis not present

## 2019-12-10 DIAGNOSIS — L97929 Non-pressure chronic ulcer of unspecified part of left lower leg with unspecified severity: Secondary | ICD-10-CM | POA: Diagnosis not present

## 2019-12-10 DIAGNOSIS — I872 Venous insufficiency (chronic) (peripheral): Secondary | ICD-10-CM | POA: Diagnosis not present

## 2019-12-10 DIAGNOSIS — Z4801 Encounter for change or removal of surgical wound dressing: Secondary | ICD-10-CM | POA: Diagnosis not present

## 2019-12-10 DIAGNOSIS — L97229 Non-pressure chronic ulcer of left calf with unspecified severity: Secondary | ICD-10-CM | POA: Diagnosis not present

## 2019-12-10 DIAGNOSIS — Z7901 Long term (current) use of anticoagulants: Secondary | ICD-10-CM | POA: Diagnosis not present

## 2019-12-12 DIAGNOSIS — Z23 Encounter for immunization: Secondary | ICD-10-CM | POA: Diagnosis not present

## 2019-12-12 DIAGNOSIS — T86821 Skin graft (allograft) (autograft) failure: Secondary | ICD-10-CM | POA: Diagnosis not present

## 2019-12-12 DIAGNOSIS — Z4801 Encounter for change or removal of surgical wound dressing: Secondary | ICD-10-CM | POA: Diagnosis not present

## 2019-12-12 DIAGNOSIS — Z7901 Long term (current) use of anticoagulants: Secondary | ICD-10-CM | POA: Diagnosis not present

## 2019-12-12 DIAGNOSIS — I872 Venous insufficiency (chronic) (peripheral): Secondary | ICD-10-CM | POA: Diagnosis not present

## 2019-12-12 DIAGNOSIS — L97229 Non-pressure chronic ulcer of left calf with unspecified severity: Secondary | ICD-10-CM | POA: Diagnosis not present

## 2019-12-12 DIAGNOSIS — L97929 Non-pressure chronic ulcer of unspecified part of left lower leg with unspecified severity: Secondary | ICD-10-CM | POA: Diagnosis not present

## 2019-12-17 DIAGNOSIS — I872 Venous insufficiency (chronic) (peripheral): Secondary | ICD-10-CM | POA: Diagnosis not present

## 2019-12-17 DIAGNOSIS — I480 Paroxysmal atrial fibrillation: Secondary | ICD-10-CM | POA: Diagnosis not present

## 2019-12-17 DIAGNOSIS — I739 Peripheral vascular disease, unspecified: Secondary | ICD-10-CM | POA: Diagnosis not present

## 2019-12-17 DIAGNOSIS — L97929 Non-pressure chronic ulcer of unspecified part of left lower leg with unspecified severity: Secondary | ICD-10-CM | POA: Diagnosis not present

## 2019-12-17 DIAGNOSIS — Z7901 Long term (current) use of anticoagulants: Secondary | ICD-10-CM | POA: Diagnosis not present

## 2019-12-17 DIAGNOSIS — L97229 Non-pressure chronic ulcer of left calf with unspecified severity: Secondary | ICD-10-CM | POA: Diagnosis not present

## 2019-12-17 DIAGNOSIS — I83022 Varicose veins of left lower extremity with ulcer of calf: Secondary | ICD-10-CM | POA: Diagnosis not present

## 2019-12-17 DIAGNOSIS — Z4801 Encounter for change or removal of surgical wound dressing: Secondary | ICD-10-CM | POA: Diagnosis not present

## 2019-12-17 DIAGNOSIS — T86821 Skin graft (allograft) (autograft) failure: Secondary | ICD-10-CM | POA: Diagnosis not present

## 2019-12-19 DIAGNOSIS — Z7901 Long term (current) use of anticoagulants: Secondary | ICD-10-CM | POA: Diagnosis not present

## 2019-12-19 DIAGNOSIS — Z4801 Encounter for change or removal of surgical wound dressing: Secondary | ICD-10-CM | POA: Diagnosis not present

## 2019-12-19 DIAGNOSIS — L97229 Non-pressure chronic ulcer of left calf with unspecified severity: Secondary | ICD-10-CM | POA: Diagnosis not present

## 2019-12-19 DIAGNOSIS — L97929 Non-pressure chronic ulcer of unspecified part of left lower leg with unspecified severity: Secondary | ICD-10-CM | POA: Diagnosis not present

## 2019-12-19 DIAGNOSIS — T86821 Skin graft (allograft) (autograft) failure: Secondary | ICD-10-CM | POA: Diagnosis not present

## 2019-12-19 DIAGNOSIS — I872 Venous insufficiency (chronic) (peripheral): Secondary | ICD-10-CM | POA: Diagnosis not present

## 2019-12-20 DIAGNOSIS — I739 Peripheral vascular disease, unspecified: Secondary | ICD-10-CM | POA: Diagnosis not present

## 2019-12-24 DIAGNOSIS — Z86718 Personal history of other venous thrombosis and embolism: Secondary | ICD-10-CM | POA: Diagnosis not present

## 2019-12-24 DIAGNOSIS — Z7901 Long term (current) use of anticoagulants: Secondary | ICD-10-CM | POA: Diagnosis not present

## 2019-12-24 DIAGNOSIS — I2510 Atherosclerotic heart disease of native coronary artery without angina pectoris: Secondary | ICD-10-CM | POA: Diagnosis not present

## 2019-12-24 DIAGNOSIS — Z9049 Acquired absence of other specified parts of digestive tract: Secondary | ICD-10-CM | POA: Diagnosis not present

## 2019-12-24 DIAGNOSIS — Z8673 Personal history of transient ischemic attack (TIA), and cerebral infarction without residual deficits: Secondary | ICD-10-CM | POA: Diagnosis not present

## 2019-12-24 DIAGNOSIS — I872 Venous insufficiency (chronic) (peripheral): Secondary | ICD-10-CM | POA: Diagnosis not present

## 2019-12-24 DIAGNOSIS — E1122 Type 2 diabetes mellitus with diabetic chronic kidney disease: Secondary | ICD-10-CM | POA: Diagnosis not present

## 2019-12-24 DIAGNOSIS — I129 Hypertensive chronic kidney disease with stage 1 through stage 4 chronic kidney disease, or unspecified chronic kidney disease: Secondary | ICD-10-CM | POA: Diagnosis not present

## 2019-12-24 DIAGNOSIS — L97221 Non-pressure chronic ulcer of left calf limited to breakdown of skin: Secondary | ICD-10-CM | POA: Diagnosis not present

## 2019-12-24 DIAGNOSIS — N183 Chronic kidney disease, stage 3 unspecified: Secondary | ICD-10-CM | POA: Diagnosis not present

## 2019-12-24 DIAGNOSIS — T86821 Skin graft (allograft) (autograft) failure: Secondary | ICD-10-CM | POA: Diagnosis not present

## 2019-12-24 DIAGNOSIS — Z79899 Other long term (current) drug therapy: Secondary | ICD-10-CM | POA: Diagnosis not present

## 2019-12-24 DIAGNOSIS — L97929 Non-pressure chronic ulcer of unspecified part of left lower leg with unspecified severity: Secondary | ICD-10-CM | POA: Diagnosis not present

## 2019-12-24 DIAGNOSIS — I83022 Varicose veins of left lower extremity with ulcer of calf: Secondary | ICD-10-CM | POA: Diagnosis not present

## 2019-12-24 DIAGNOSIS — L97229 Non-pressure chronic ulcer of left calf with unspecified severity: Secondary | ICD-10-CM | POA: Diagnosis not present

## 2019-12-24 DIAGNOSIS — S51011A Laceration without foreign body of right elbow, initial encounter: Secondary | ICD-10-CM | POA: Diagnosis not present

## 2019-12-24 DIAGNOSIS — E1151 Type 2 diabetes mellitus with diabetic peripheral angiopathy without gangrene: Secondary | ICD-10-CM | POA: Diagnosis not present

## 2019-12-24 DIAGNOSIS — Z87891 Personal history of nicotine dependence: Secondary | ICD-10-CM | POA: Diagnosis not present

## 2019-12-24 DIAGNOSIS — Z4801 Encounter for change or removal of surgical wound dressing: Secondary | ICD-10-CM | POA: Diagnosis not present

## 2019-12-27 DIAGNOSIS — L97929 Non-pressure chronic ulcer of unspecified part of left lower leg with unspecified severity: Secondary | ICD-10-CM | POA: Diagnosis not present

## 2019-12-27 DIAGNOSIS — I872 Venous insufficiency (chronic) (peripheral): Secondary | ICD-10-CM | POA: Diagnosis not present

## 2019-12-27 DIAGNOSIS — Z7901 Long term (current) use of anticoagulants: Secondary | ICD-10-CM | POA: Diagnosis not present

## 2019-12-27 DIAGNOSIS — Z4801 Encounter for change or removal of surgical wound dressing: Secondary | ICD-10-CM | POA: Diagnosis not present

## 2019-12-27 DIAGNOSIS — L97229 Non-pressure chronic ulcer of left calf with unspecified severity: Secondary | ICD-10-CM | POA: Diagnosis not present

## 2019-12-27 DIAGNOSIS — T86821 Skin graft (allograft) (autograft) failure: Secondary | ICD-10-CM | POA: Diagnosis not present

## 2019-12-31 DIAGNOSIS — Z7901 Long term (current) use of anticoagulants: Secondary | ICD-10-CM | POA: Diagnosis not present

## 2019-12-31 DIAGNOSIS — Z4801 Encounter for change or removal of surgical wound dressing: Secondary | ICD-10-CM | POA: Diagnosis not present

## 2019-12-31 DIAGNOSIS — L97929 Non-pressure chronic ulcer of unspecified part of left lower leg with unspecified severity: Secondary | ICD-10-CM | POA: Diagnosis not present

## 2019-12-31 DIAGNOSIS — L97229 Non-pressure chronic ulcer of left calf with unspecified severity: Secondary | ICD-10-CM | POA: Diagnosis not present

## 2019-12-31 DIAGNOSIS — T86821 Skin graft (allograft) (autograft) failure: Secondary | ICD-10-CM | POA: Diagnosis not present

## 2019-12-31 DIAGNOSIS — I872 Venous insufficiency (chronic) (peripheral): Secondary | ICD-10-CM | POA: Diagnosis not present

## 2020-01-02 DIAGNOSIS — Z7901 Long term (current) use of anticoagulants: Secondary | ICD-10-CM | POA: Diagnosis not present

## 2020-01-02 DIAGNOSIS — L97929 Non-pressure chronic ulcer of unspecified part of left lower leg with unspecified severity: Secondary | ICD-10-CM | POA: Diagnosis not present

## 2020-01-02 DIAGNOSIS — T86821 Skin graft (allograft) (autograft) failure: Secondary | ICD-10-CM | POA: Diagnosis not present

## 2020-01-02 DIAGNOSIS — L97229 Non-pressure chronic ulcer of left calf with unspecified severity: Secondary | ICD-10-CM | POA: Diagnosis not present

## 2020-01-02 DIAGNOSIS — Z4801 Encounter for change or removal of surgical wound dressing: Secondary | ICD-10-CM | POA: Diagnosis not present

## 2020-01-02 DIAGNOSIS — I872 Venous insufficiency (chronic) (peripheral): Secondary | ICD-10-CM | POA: Diagnosis not present

## 2020-01-07 DIAGNOSIS — L97229 Non-pressure chronic ulcer of left calf with unspecified severity: Secondary | ICD-10-CM | POA: Diagnosis not present

## 2020-01-07 DIAGNOSIS — Z7901 Long term (current) use of anticoagulants: Secondary | ICD-10-CM | POA: Diagnosis not present

## 2020-01-07 DIAGNOSIS — L97929 Non-pressure chronic ulcer of unspecified part of left lower leg with unspecified severity: Secondary | ICD-10-CM | POA: Diagnosis not present

## 2020-01-07 DIAGNOSIS — Z4801 Encounter for change or removal of surgical wound dressing: Secondary | ICD-10-CM | POA: Diagnosis not present

## 2020-01-07 DIAGNOSIS — T86821 Skin graft (allograft) (autograft) failure: Secondary | ICD-10-CM | POA: Diagnosis not present

## 2020-01-07 DIAGNOSIS — I872 Venous insufficiency (chronic) (peripheral): Secondary | ICD-10-CM | POA: Diagnosis not present

## 2020-01-09 DIAGNOSIS — T86821 Skin graft (allograft) (autograft) failure: Secondary | ICD-10-CM | POA: Diagnosis not present

## 2020-01-09 DIAGNOSIS — L97929 Non-pressure chronic ulcer of unspecified part of left lower leg with unspecified severity: Secondary | ICD-10-CM | POA: Diagnosis not present

## 2020-01-09 DIAGNOSIS — I872 Venous insufficiency (chronic) (peripheral): Secondary | ICD-10-CM | POA: Diagnosis not present

## 2020-01-09 DIAGNOSIS — L97229 Non-pressure chronic ulcer of left calf with unspecified severity: Secondary | ICD-10-CM | POA: Diagnosis not present

## 2020-01-09 DIAGNOSIS — Z7901 Long term (current) use of anticoagulants: Secondary | ICD-10-CM | POA: Diagnosis not present

## 2020-01-09 DIAGNOSIS — Z4801 Encounter for change or removal of surgical wound dressing: Secondary | ICD-10-CM | POA: Diagnosis not present

## 2020-01-14 DIAGNOSIS — L97229 Non-pressure chronic ulcer of left calf with unspecified severity: Secondary | ICD-10-CM | POA: Diagnosis not present

## 2020-01-14 DIAGNOSIS — Z4801 Encounter for change or removal of surgical wound dressing: Secondary | ICD-10-CM | POA: Diagnosis not present

## 2020-01-14 DIAGNOSIS — Z7901 Long term (current) use of anticoagulants: Secondary | ICD-10-CM | POA: Diagnosis not present

## 2020-01-14 DIAGNOSIS — I872 Venous insufficiency (chronic) (peripheral): Secondary | ICD-10-CM | POA: Diagnosis not present

## 2020-01-14 DIAGNOSIS — T86821 Skin graft (allograft) (autograft) failure: Secondary | ICD-10-CM | POA: Diagnosis not present

## 2020-01-14 DIAGNOSIS — L97929 Non-pressure chronic ulcer of unspecified part of left lower leg with unspecified severity: Secondary | ICD-10-CM | POA: Diagnosis not present

## 2020-01-16 DIAGNOSIS — I872 Venous insufficiency (chronic) (peripheral): Secondary | ICD-10-CM | POA: Diagnosis not present

## 2020-01-16 DIAGNOSIS — Z7901 Long term (current) use of anticoagulants: Secondary | ICD-10-CM | POA: Diagnosis not present

## 2020-01-16 DIAGNOSIS — Z4801 Encounter for change or removal of surgical wound dressing: Secondary | ICD-10-CM | POA: Diagnosis not present

## 2020-01-16 DIAGNOSIS — L97929 Non-pressure chronic ulcer of unspecified part of left lower leg with unspecified severity: Secondary | ICD-10-CM | POA: Diagnosis not present

## 2020-01-16 DIAGNOSIS — L97229 Non-pressure chronic ulcer of left calf with unspecified severity: Secondary | ICD-10-CM | POA: Diagnosis not present

## 2020-01-16 DIAGNOSIS — T86821 Skin graft (allograft) (autograft) failure: Secondary | ICD-10-CM | POA: Diagnosis not present

## 2020-01-17 DIAGNOSIS — L97229 Non-pressure chronic ulcer of left calf with unspecified severity: Secondary | ICD-10-CM | POA: Diagnosis not present

## 2020-01-17 DIAGNOSIS — I872 Venous insufficiency (chronic) (peripheral): Secondary | ICD-10-CM | POA: Diagnosis not present

## 2020-01-17 DIAGNOSIS — T86821 Skin graft (allograft) (autograft) failure: Secondary | ICD-10-CM | POA: Diagnosis not present

## 2020-01-17 DIAGNOSIS — Z7901 Long term (current) use of anticoagulants: Secondary | ICD-10-CM | POA: Diagnosis not present

## 2020-01-17 DIAGNOSIS — L97929 Non-pressure chronic ulcer of unspecified part of left lower leg with unspecified severity: Secondary | ICD-10-CM | POA: Diagnosis not present

## 2020-01-17 DIAGNOSIS — Z4801 Encounter for change or removal of surgical wound dressing: Secondary | ICD-10-CM | POA: Diagnosis not present

## 2020-01-20 DIAGNOSIS — L97929 Non-pressure chronic ulcer of unspecified part of left lower leg with unspecified severity: Secondary | ICD-10-CM | POA: Diagnosis not present

## 2020-01-20 DIAGNOSIS — I872 Venous insufficiency (chronic) (peripheral): Secondary | ICD-10-CM | POA: Diagnosis not present

## 2020-01-20 DIAGNOSIS — T86821 Skin graft (allograft) (autograft) failure: Secondary | ICD-10-CM | POA: Diagnosis not present

## 2020-01-20 DIAGNOSIS — L97229 Non-pressure chronic ulcer of left calf with unspecified severity: Secondary | ICD-10-CM | POA: Diagnosis not present

## 2020-01-20 DIAGNOSIS — Z7901 Long term (current) use of anticoagulants: Secondary | ICD-10-CM | POA: Diagnosis not present

## 2020-01-20 DIAGNOSIS — Z4801 Encounter for change or removal of surgical wound dressing: Secondary | ICD-10-CM | POA: Diagnosis not present

## 2020-01-22 DIAGNOSIS — Z4801 Encounter for change or removal of surgical wound dressing: Secondary | ICD-10-CM | POA: Diagnosis not present

## 2020-01-22 DIAGNOSIS — L97229 Non-pressure chronic ulcer of left calf with unspecified severity: Secondary | ICD-10-CM | POA: Diagnosis not present

## 2020-01-22 DIAGNOSIS — Z7901 Long term (current) use of anticoagulants: Secondary | ICD-10-CM | POA: Diagnosis not present

## 2020-01-22 DIAGNOSIS — I872 Venous insufficiency (chronic) (peripheral): Secondary | ICD-10-CM | POA: Diagnosis not present

## 2020-01-22 DIAGNOSIS — T86821 Skin graft (allograft) (autograft) failure: Secondary | ICD-10-CM | POA: Diagnosis not present

## 2020-01-22 DIAGNOSIS — L97929 Non-pressure chronic ulcer of unspecified part of left lower leg with unspecified severity: Secondary | ICD-10-CM | POA: Diagnosis not present

## 2020-01-27 DIAGNOSIS — Z7901 Long term (current) use of anticoagulants: Secondary | ICD-10-CM | POA: Diagnosis not present

## 2020-01-27 DIAGNOSIS — T86821 Skin graft (allograft) (autograft) failure: Secondary | ICD-10-CM | POA: Diagnosis not present

## 2020-01-27 DIAGNOSIS — L97229 Non-pressure chronic ulcer of left calf with unspecified severity: Secondary | ICD-10-CM | POA: Diagnosis not present

## 2020-01-27 DIAGNOSIS — L97929 Non-pressure chronic ulcer of unspecified part of left lower leg with unspecified severity: Secondary | ICD-10-CM | POA: Diagnosis not present

## 2020-01-27 DIAGNOSIS — I872 Venous insufficiency (chronic) (peripheral): Secondary | ICD-10-CM | POA: Diagnosis not present

## 2020-01-27 DIAGNOSIS — Z4801 Encounter for change or removal of surgical wound dressing: Secondary | ICD-10-CM | POA: Diagnosis not present

## 2020-01-29 DIAGNOSIS — L97929 Non-pressure chronic ulcer of unspecified part of left lower leg with unspecified severity: Secondary | ICD-10-CM | POA: Diagnosis not present

## 2020-01-29 DIAGNOSIS — L97229 Non-pressure chronic ulcer of left calf with unspecified severity: Secondary | ICD-10-CM | POA: Diagnosis not present

## 2020-01-29 DIAGNOSIS — Z7901 Long term (current) use of anticoagulants: Secondary | ICD-10-CM | POA: Diagnosis not present

## 2020-01-29 DIAGNOSIS — I872 Venous insufficiency (chronic) (peripheral): Secondary | ICD-10-CM | POA: Diagnosis not present

## 2020-01-29 DIAGNOSIS — Z4801 Encounter for change or removal of surgical wound dressing: Secondary | ICD-10-CM | POA: Diagnosis not present

## 2020-01-29 DIAGNOSIS — T86821 Skin graft (allograft) (autograft) failure: Secondary | ICD-10-CM | POA: Diagnosis not present

## 2020-01-31 DIAGNOSIS — I70299 Other atherosclerosis of native arteries of extremities, unspecified extremity: Secondary | ICD-10-CM | POA: Diagnosis not present

## 2020-01-31 DIAGNOSIS — L97229 Non-pressure chronic ulcer of left calf with unspecified severity: Secondary | ICD-10-CM | POA: Diagnosis not present

## 2020-01-31 DIAGNOSIS — Z8673 Personal history of transient ischemic attack (TIA), and cerebral infarction without residual deficits: Secondary | ICD-10-CM | POA: Diagnosis not present

## 2020-01-31 DIAGNOSIS — I872 Venous insufficiency (chronic) (peripheral): Secondary | ICD-10-CM | POA: Diagnosis not present

## 2020-01-31 DIAGNOSIS — N183 Chronic kidney disease, stage 3 unspecified: Secondary | ICD-10-CM | POA: Diagnosis not present

## 2020-01-31 DIAGNOSIS — I2510 Atherosclerotic heart disease of native coronary artery without angina pectoris: Secondary | ICD-10-CM | POA: Diagnosis not present

## 2020-01-31 DIAGNOSIS — Z86718 Personal history of other venous thrombosis and embolism: Secondary | ICD-10-CM | POA: Diagnosis not present

## 2020-01-31 DIAGNOSIS — L97221 Non-pressure chronic ulcer of left calf limited to breakdown of skin: Secondary | ICD-10-CM | POA: Diagnosis not present

## 2020-01-31 DIAGNOSIS — E1151 Type 2 diabetes mellitus with diabetic peripheral angiopathy without gangrene: Secondary | ICD-10-CM | POA: Diagnosis not present

## 2020-01-31 DIAGNOSIS — Z87891 Personal history of nicotine dependence: Secondary | ICD-10-CM | POA: Diagnosis not present

## 2020-01-31 DIAGNOSIS — Z9049 Acquired absence of other specified parts of digestive tract: Secondary | ICD-10-CM | POA: Diagnosis not present

## 2020-01-31 DIAGNOSIS — I83022 Varicose veins of left lower extremity with ulcer of calf: Secondary | ICD-10-CM | POA: Diagnosis not present

## 2020-01-31 DIAGNOSIS — Z8614 Personal history of Methicillin resistant Staphylococcus aureus infection: Secondary | ICD-10-CM | POA: Diagnosis not present

## 2020-01-31 DIAGNOSIS — I129 Hypertensive chronic kidney disease with stage 1 through stage 4 chronic kidney disease, or unspecified chronic kidney disease: Secondary | ICD-10-CM | POA: Diagnosis not present

## 2020-01-31 DIAGNOSIS — Z79899 Other long term (current) drug therapy: Secondary | ICD-10-CM | POA: Diagnosis not present

## 2020-01-31 DIAGNOSIS — E1122 Type 2 diabetes mellitus with diabetic chronic kidney disease: Secondary | ICD-10-CM | POA: Diagnosis not present

## 2020-02-04 DIAGNOSIS — T86821 Skin graft (allograft) (autograft) failure: Secondary | ICD-10-CM | POA: Diagnosis not present

## 2020-02-04 DIAGNOSIS — I872 Venous insufficiency (chronic) (peripheral): Secondary | ICD-10-CM | POA: Diagnosis not present

## 2020-02-04 DIAGNOSIS — Z4801 Encounter for change or removal of surgical wound dressing: Secondary | ICD-10-CM | POA: Diagnosis not present

## 2020-02-04 DIAGNOSIS — Z7901 Long term (current) use of anticoagulants: Secondary | ICD-10-CM | POA: Diagnosis not present

## 2020-02-04 DIAGNOSIS — L97929 Non-pressure chronic ulcer of unspecified part of left lower leg with unspecified severity: Secondary | ICD-10-CM | POA: Diagnosis not present

## 2020-02-04 DIAGNOSIS — L97229 Non-pressure chronic ulcer of left calf with unspecified severity: Secondary | ICD-10-CM | POA: Diagnosis not present

## 2020-02-07 DIAGNOSIS — I872 Venous insufficiency (chronic) (peripheral): Secondary | ICD-10-CM | POA: Diagnosis not present

## 2020-02-07 DIAGNOSIS — T86821 Skin graft (allograft) (autograft) failure: Secondary | ICD-10-CM | POA: Diagnosis not present

## 2020-02-07 DIAGNOSIS — Z7901 Long term (current) use of anticoagulants: Secondary | ICD-10-CM | POA: Diagnosis not present

## 2020-02-07 DIAGNOSIS — L97229 Non-pressure chronic ulcer of left calf with unspecified severity: Secondary | ICD-10-CM | POA: Diagnosis not present

## 2020-02-07 DIAGNOSIS — L97929 Non-pressure chronic ulcer of unspecified part of left lower leg with unspecified severity: Secondary | ICD-10-CM | POA: Diagnosis not present

## 2020-02-07 DIAGNOSIS — Z4801 Encounter for change or removal of surgical wound dressing: Secondary | ICD-10-CM | POA: Diagnosis not present

## 2020-02-10 DIAGNOSIS — T86821 Skin graft (allograft) (autograft) failure: Secondary | ICD-10-CM | POA: Diagnosis not present

## 2020-02-10 DIAGNOSIS — Z7901 Long term (current) use of anticoagulants: Secondary | ICD-10-CM | POA: Diagnosis not present

## 2020-02-10 DIAGNOSIS — I872 Venous insufficiency (chronic) (peripheral): Secondary | ICD-10-CM | POA: Diagnosis not present

## 2020-02-10 DIAGNOSIS — L97929 Non-pressure chronic ulcer of unspecified part of left lower leg with unspecified severity: Secondary | ICD-10-CM | POA: Diagnosis not present

## 2020-02-10 DIAGNOSIS — Z4801 Encounter for change or removal of surgical wound dressing: Secondary | ICD-10-CM | POA: Diagnosis not present

## 2020-02-10 DIAGNOSIS — L97229 Non-pressure chronic ulcer of left calf with unspecified severity: Secondary | ICD-10-CM | POA: Diagnosis not present

## 2020-02-13 DIAGNOSIS — L97229 Non-pressure chronic ulcer of left calf with unspecified severity: Secondary | ICD-10-CM | POA: Diagnosis not present

## 2020-02-13 DIAGNOSIS — Z4801 Encounter for change or removal of surgical wound dressing: Secondary | ICD-10-CM | POA: Diagnosis not present

## 2020-02-13 DIAGNOSIS — Z7901 Long term (current) use of anticoagulants: Secondary | ICD-10-CM | POA: Diagnosis not present

## 2020-02-13 DIAGNOSIS — L97929 Non-pressure chronic ulcer of unspecified part of left lower leg with unspecified severity: Secondary | ICD-10-CM | POA: Diagnosis not present

## 2020-02-13 DIAGNOSIS — T86821 Skin graft (allograft) (autograft) failure: Secondary | ICD-10-CM | POA: Diagnosis not present

## 2020-02-13 DIAGNOSIS — I872 Venous insufficiency (chronic) (peripheral): Secondary | ICD-10-CM | POA: Diagnosis not present

## 2020-02-15 DIAGNOSIS — T86821 Skin graft (allograft) (autograft) failure: Secondary | ICD-10-CM | POA: Diagnosis not present

## 2020-02-15 DIAGNOSIS — Z4801 Encounter for change or removal of surgical wound dressing: Secondary | ICD-10-CM | POA: Diagnosis not present

## 2020-02-15 DIAGNOSIS — Z7901 Long term (current) use of anticoagulants: Secondary | ICD-10-CM | POA: Diagnosis not present

## 2020-02-15 DIAGNOSIS — L97229 Non-pressure chronic ulcer of left calf with unspecified severity: Secondary | ICD-10-CM | POA: Diagnosis not present

## 2020-02-15 DIAGNOSIS — I872 Venous insufficiency (chronic) (peripheral): Secondary | ICD-10-CM | POA: Diagnosis not present

## 2020-02-15 DIAGNOSIS — L97929 Non-pressure chronic ulcer of unspecified part of left lower leg with unspecified severity: Secondary | ICD-10-CM | POA: Diagnosis not present

## 2020-02-20 ENCOUNTER — Emergency Department (HOSPITAL_COMMUNITY): Payer: Medicare Other

## 2020-02-20 ENCOUNTER — Observation Stay (HOSPITAL_COMMUNITY)
Admission: EM | Admit: 2020-02-20 | Discharge: 2020-02-21 | Disposition: A | Discharge status: Home / Self Care | Payer: Medicare Other | Attending: Internal Medicine | Admitting: Internal Medicine

## 2020-02-20 ENCOUNTER — Encounter (HOSPITAL_COMMUNITY): Payer: Self-pay | Admitting: Emergency Medicine

## 2020-02-20 ENCOUNTER — Other Ambulatory Visit: Payer: Self-pay

## 2020-02-20 DIAGNOSIS — Z79899 Other long term (current) drug therapy: Secondary | ICD-10-CM | POA: Insufficient documentation

## 2020-02-20 DIAGNOSIS — Z7901 Long term (current) use of anticoagulants: Secondary | ICD-10-CM

## 2020-02-20 DIAGNOSIS — Z87891 Personal history of nicotine dependence: Secondary | ICD-10-CM | POA: Insufficient documentation

## 2020-02-20 DIAGNOSIS — N184 Chronic kidney disease, stage 4 (severe): Secondary | ICD-10-CM | POA: Diagnosis not present

## 2020-02-20 DIAGNOSIS — U071 COVID-19: Principal | ICD-10-CM | POA: Insufficient documentation

## 2020-02-20 DIAGNOSIS — I480 Paroxysmal atrial fibrillation: Secondary | ICD-10-CM | POA: Diagnosis present

## 2020-02-20 DIAGNOSIS — J1282 Pneumonia due to coronavirus disease 2019: Secondary | ICD-10-CM | POA: Diagnosis not present

## 2020-02-20 DIAGNOSIS — I10 Essential (primary) hypertension: Secondary | ICD-10-CM | POA: Diagnosis present

## 2020-02-20 DIAGNOSIS — R531 Weakness: Secondary | ICD-10-CM | POA: Diagnosis present

## 2020-02-20 DIAGNOSIS — J90 Pleural effusion, not elsewhere classified: Secondary | ICD-10-CM | POA: Diagnosis not present

## 2020-02-20 DIAGNOSIS — R059 Cough, unspecified: Secondary | ICD-10-CM | POA: Diagnosis not present

## 2020-02-20 DIAGNOSIS — J069 Acute upper respiratory infection, unspecified: Secondary | ICD-10-CM | POA: Diagnosis not present

## 2020-02-20 DIAGNOSIS — R509 Fever, unspecified: Secondary | ICD-10-CM | POA: Diagnosis not present

## 2020-02-20 DIAGNOSIS — R0602 Shortness of breath: Secondary | ICD-10-CM | POA: Diagnosis not present

## 2020-02-20 DIAGNOSIS — D638 Anemia in other chronic diseases classified elsewhere: Secondary | ICD-10-CM | POA: Diagnosis present

## 2020-02-20 LAB — CBC WITH DIFFERENTIAL/PLATELET
Abs Immature Granulocytes: 0.02 10*3/uL (ref 0.00–0.07)
Basophils Absolute: 0 10*3/uL (ref 0.0–0.1)
Basophils Relative: 0 %
Eosinophils Absolute: 0 10*3/uL (ref 0.0–0.5)
Eosinophils Relative: 1 %
HCT: 34.3 % — ABNORMAL LOW (ref 39.0–52.0)
Hemoglobin: 11.1 g/dL — ABNORMAL LOW (ref 13.0–17.0)
Immature Granulocytes: 0 %
Lymphocytes Relative: 9 %
Lymphs Abs: 0.4 10*3/uL — ABNORMAL LOW (ref 0.7–4.0)
MCH: 29.4 pg (ref 26.0–34.0)
MCHC: 32.4 g/dL (ref 30.0–36.0)
MCV: 91 fL (ref 80.0–100.0)
Monocytes Absolute: 0.4 10*3/uL (ref 0.1–1.0)
Monocytes Relative: 9 %
Neutro Abs: 3.7 10*3/uL (ref 1.7–7.7)
Neutrophils Relative %: 81 %
Platelets: 143 10*3/uL — ABNORMAL LOW (ref 150–400)
RBC: 3.77 MIL/uL — ABNORMAL LOW (ref 4.22–5.81)
RDW: 14.8 % (ref 11.5–15.5)
WBC: 4.6 10*3/uL (ref 4.0–10.5)
nRBC: 0 % (ref 0.0–0.2)

## 2020-02-20 LAB — COMPREHENSIVE METABOLIC PANEL
ALT: 16 U/L (ref 0–44)
AST: 18 U/L (ref 15–41)
Albumin: 2.9 g/dL — ABNORMAL LOW (ref 3.5–5.0)
Alkaline Phosphatase: 74 U/L (ref 38–126)
Anion gap: 8 (ref 5–15)
BUN: 38 mg/dL — ABNORMAL HIGH (ref 8–23)
CO2: 21 mmol/L — ABNORMAL LOW (ref 22–32)
Calcium: 7.5 mg/dL — ABNORMAL LOW (ref 8.9–10.3)
Chloride: 104 mmol/L (ref 98–111)
Creatinine, Ser: 2.16 mg/dL — ABNORMAL HIGH (ref 0.61–1.24)
GFR, Estimated: 28 mL/min — ABNORMAL LOW (ref >60)
Glucose, Bld: 146 mg/dL — ABNORMAL HIGH (ref 70–99)
Potassium: 3.8 mmol/L (ref 3.5–5.1)
Sodium: 133 mmol/L — ABNORMAL LOW (ref 135–145)
Total Bilirubin: 0.6 mg/dL (ref 0.3–1.2)
Total Protein: 6.4 g/dL — ABNORMAL LOW (ref 6.5–8.1)

## 2020-02-20 LAB — URINALYSIS, ROUTINE W REFLEX MICROSCOPIC
Bacteria, UA: NONE SEEN
Bilirubin Urine: NEGATIVE
Glucose, UA: 50 mg/dL — AB
Ketones, ur: NEGATIVE mg/dL
Leukocytes,Ua: NEGATIVE
Nitrite: NEGATIVE
Protein, ur: >=300 mg/dL — AB
Specific Gravity, Urine: 1.017 (ref 1.005–1.030)
pH: 5 (ref 5.0–8.0)

## 2020-02-20 LAB — RESP PANEL BY RT-PCR (FLU A&B, COVID) ARPGX2
Influenza A by PCR: NEGATIVE
Influenza B by PCR: NEGATIVE
SARS Coronavirus 2 by RT PCR: POSITIVE — AB

## 2020-02-20 LAB — D-DIMER, QUANTITATIVE: D-Dimer, Quant: 2.21 ug/mL-FEU — ABNORMAL HIGH (ref 0.00–0.50)

## 2020-02-20 LAB — TROPONIN I (HIGH SENSITIVITY)
Troponin I (High Sensitivity): 26 ng/L — ABNORMAL HIGH (ref <18)
Troponin I (High Sensitivity): 23 ng/L — ABNORMAL HIGH (ref <18)

## 2020-02-20 LAB — C-REACTIVE PROTEIN: CRP: 8.4 mg/dL — ABNORMAL HIGH (ref <1.0)

## 2020-02-20 LAB — GLUCOSE, CAPILLARY: Glucose-Capillary: 300 mg/dL — ABNORMAL HIGH (ref 70–99)

## 2020-02-20 LAB — FIBRINOGEN: Fibrinogen: 679 mg/dL — ABNORMAL HIGH (ref 210–475)

## 2020-02-20 LAB — CBG MONITORING, ED: Glucose-Capillary: 159 mg/dL — ABNORMAL HIGH (ref 70–99)

## 2020-02-20 LAB — PROTIME-INR
INR: 1.4 — ABNORMAL HIGH (ref 0.8–1.2)
Prothrombin Time: 16.5 seconds — ABNORMAL HIGH (ref 11.4–15.2)

## 2020-02-20 LAB — APTT: aPTT: 35 seconds (ref 24–36)

## 2020-02-20 LAB — LACTIC ACID, PLASMA: Lactic Acid, Venous: 0.9 mmol/L (ref 0.5–1.9)

## 2020-02-20 LAB — FERRITIN: Ferritin: 188 ng/mL (ref 24–336)

## 2020-02-20 LAB — LACTATE DEHYDROGENASE: LDH: 160 U/L (ref 98–192)

## 2020-02-20 LAB — TRIGLYCERIDES: Triglycerides: 43 mg/dL (ref <150)

## 2020-02-20 LAB — PROCALCITONIN: Procalcitonin: 0.11 ng/mL

## 2020-02-20 MED ORDER — SODIUM CHLORIDE 0.9 % IV SOLN
1200.0000 mg | Freq: Once | INTRAVENOUS | Status: AC
  Administered 2020-02-20: 12:00:00 1200 mg via INTRAVENOUS
  Filled 2020-02-20: qty 10

## 2020-02-20 MED ORDER — SODIUM CHLORIDE 0.9 % IV SOLN: 100.0000 mg | Freq: Every day | INTRAVENOUS | Status: DC

## 2020-02-20 MED ORDER — ASCORBIC ACID 500 MG PO TABS
500.0000 mg | ORAL_TABLET | Freq: Every day | ORAL | Status: DC
  Administered 2020-02-20 – 2020-02-21 (×2): 500 mg via ORAL
  Filled 2020-02-20 (×2): qty 1

## 2020-02-20 MED ORDER — SODIUM CHLORIDE 0.9 % IV SOLN
100.0000 mg | Freq: Every day | INTRAVENOUS | Status: DC
  Administered 2020-02-21: 09:00:00 100 mg via INTRAVENOUS
  Filled 2020-02-20: qty 20

## 2020-02-20 MED ORDER — IPRATROPIUM-ALBUTEROL 20-100 MCG/ACT IN AERS
1.0000 | INHALATION_SPRAY | Freq: Four times a day (QID) | RESPIRATORY_TRACT | Status: DC
  Administered 2020-02-20 – 2020-02-21 (×2): 1 via RESPIRATORY_TRACT
  Filled 2020-02-20: qty 4

## 2020-02-20 MED ORDER — SODIUM CHLORIDE 0.9 % IV SOLN: Freq: Once | INTRAVENOUS | Status: DC

## 2020-02-20 MED ORDER — SODIUM CHLORIDE 0.9% FLUSH: 3.0000 mL | INTRAVENOUS | Status: DC | PRN

## 2020-02-20 MED ORDER — METHYLPREDNISOLONE SODIUM SUCC 40 MG IJ SOLR
40.0000 mg | Freq: Two times a day (BID) | INTRAMUSCULAR | Status: DC
  Administered 2020-02-21: 08:00:00 40 mg via INTRAVENOUS
  Filled 2020-02-20: qty 1

## 2020-02-20 MED ORDER — INSULIN ASPART 100 UNIT/ML ~~LOC~~ SOLN
0.0000 [IU] | Freq: Every day | SUBCUTANEOUS | Status: DC
  Administered 2020-02-20: 21:00:00 3 [IU] via SUBCUTANEOUS

## 2020-02-20 MED ORDER — ACETAMINOPHEN 325 MG PO TABS: 650.0000 mg | ORAL_TABLET | Freq: Four times a day (QID) | ORAL | Status: DC | PRN

## 2020-02-20 MED ORDER — EPINEPHRINE 0.3 MG/0.3ML IJ SOAJ: 0.3000 mg | Freq: Once | INTRAMUSCULAR | Status: DC | PRN

## 2020-02-20 MED ORDER — METHYLPREDNISOLONE SODIUM SUCC 125 MG IJ SOLR: 125.0000 mg | Freq: Once | INTRAMUSCULAR | Status: DC | PRN

## 2020-02-20 MED ORDER — DIPHENHYDRAMINE HCL 50 MG/ML IJ SOLN: 50.0000 mg | Freq: Once | INTRAMUSCULAR | Status: DC | PRN

## 2020-02-20 MED ORDER — SODIUM CHLORIDE 0.9% FLUSH
3.0000 mL | Freq: Two times a day (BID) | INTRAVENOUS | Status: DC
  Administered 2020-02-21: 09:00:00 3 mL via INTRAVENOUS

## 2020-02-20 MED ORDER — TRAZODONE HCL 50 MG PO TABS: 50.0000 mg | ORAL_TABLET | Freq: Every evening | ORAL | Status: DC | PRN

## 2020-02-20 MED ORDER — SODIUM CHLORIDE 0.9 % IV SOLN: INTRAVENOUS | Status: DC | PRN

## 2020-02-20 MED ORDER — SODIUM CHLORIDE 0.9 % IV BOLUS
1000.0000 mL | Freq: Once | INTRAVENOUS | Status: AC
  Administered 2020-02-20: 10:00:00 1000 mL via INTRAVENOUS

## 2020-02-20 MED ORDER — SODIUM CHLORIDE 0.9 % IV SOLN
500.0000 mg | Freq: Once | INTRAVENOUS | Status: AC
  Administered 2020-02-20: 10:00:00 500 mg via INTRAVENOUS
  Filled 2020-02-20: qty 500

## 2020-02-20 MED ORDER — ACETAMINOPHEN 650 MG RE SUPP: 650.0000 mg | Freq: Four times a day (QID) | RECTAL | Status: DC | PRN

## 2020-02-20 MED ORDER — LABETALOL HCL 5 MG/ML IV SOLN: 10.0000 mg | INTRAVENOUS | Status: DC | PRN

## 2020-02-20 MED ORDER — SODIUM CHLORIDE 0.9 % IV SOLN: INTRAVENOUS | Status: AC

## 2020-02-20 MED ORDER — SODIUM CHLORIDE 0.9 % IV SOLN: 100.0000 mg | INTRAVENOUS | Status: DC

## 2020-02-20 MED ORDER — FAMOTIDINE IN NACL 20-0.9 MG/50ML-% IV SOLN: 20.0000 mg | Freq: Once | INTRAVENOUS | Status: DC | PRN

## 2020-02-20 MED ORDER — SODIUM CHLORIDE 0.9 % IV SOLN: 200.0000 mg | Freq: Once | INTRAVENOUS | Status: DC

## 2020-02-20 MED ORDER — SODIUM CHLORIDE 0.9 % IV SOLN: 250.0000 mL | INTRAVENOUS | Status: DC | PRN

## 2020-02-20 MED ORDER — ALBUTEROL SULFATE HFA 108 (90 BASE) MCG/ACT IN AERS
2.0000 | INHALATION_SPRAY | Freq: Once | RESPIRATORY_TRACT | Status: AC | PRN
  Administered 2020-02-20: 14:00:00 2 via RESPIRATORY_TRACT
  Filled 2020-02-20: qty 6.7

## 2020-02-20 MED ORDER — POLYETHYLENE GLYCOL 3350 17 G PO PACK: 17.0000 g | PACK | Freq: Every day | ORAL | Status: DC | PRN

## 2020-02-20 MED ORDER — GUAIFENESIN-DM 100-10 MG/5ML PO SYRP: 10.0000 mL | ORAL_SOLUTION | ORAL | Status: DC | PRN

## 2020-02-20 MED ORDER — INSULIN ASPART 100 UNIT/ML ~~LOC~~ SOLN
0.0000 [IU] | Freq: Three times a day (TID) | SUBCUTANEOUS | Status: DC
  Administered 2020-02-20: 18:00:00 2 [IU] via SUBCUTANEOUS
  Administered 2020-02-21: 08:00:00 3 [IU] via SUBCUTANEOUS
  Filled 2020-02-20: qty 1

## 2020-02-20 MED ORDER — METHYLPREDNISOLONE SODIUM SUCC 125 MG IJ SOLR
125.0000 mg | Freq: Once | INTRAMUSCULAR | Status: AC
  Administered 2020-02-20: 12:00:00 125 mg via INTRAVENOUS
  Filled 2020-02-20: qty 2

## 2020-02-20 MED ORDER — BISACODYL 10 MG RE SUPP: 10.0000 mg | Freq: Every day | RECTAL | Status: DC | PRN

## 2020-02-20 MED ORDER — ONDANSETRON HCL 4 MG PO TABS: 4.0000 mg | ORAL_TABLET | Freq: Four times a day (QID) | ORAL | Status: DC | PRN

## 2020-02-20 MED ORDER — SODIUM CHLORIDE 0.9 % IV SOLN
1.0000 g | Freq: Once | INTRAVENOUS | Status: AC
  Administered 2020-02-20: 10:00:00 1 g via INTRAVENOUS
  Filled 2020-02-20: qty 10

## 2020-02-20 MED ORDER — HYDROCOD POLST-CPM POLST ER 10-8 MG/5ML PO SUER: 5.0000 mL | Freq: Two times a day (BID) | ORAL | Status: DC | PRN

## 2020-02-20 MED ORDER — REMDESIVIR 100 MG IV SOLR
100.0000 mg | INTRAVENOUS | Status: AC
Start: 2020-02-20 — End: 2020-02-20
  Administered 2020-02-20 (×2): 100 mg via INTRAVENOUS
  Filled 2020-02-20 (×2): qty 20

## 2020-02-20 MED ORDER — ONDANSETRON HCL 4 MG/2ML IJ SOLN: 4.0000 mg | Freq: Four times a day (QID) | INTRAMUSCULAR | Status: DC | PRN

## 2020-02-20 MED ORDER — ALBUTEROL SULFATE HFA 108 (90 BASE) MCG/ACT IN AERS: 2.0000 | INHALATION_SPRAY | Freq: Once | RESPIRATORY_TRACT | Status: DC | PRN

## 2020-02-20 MED ORDER — APIXABAN 2.5 MG PO TABS
2.5000 mg | ORAL_TABLET | Freq: Two times a day (BID) | ORAL | Status: DC
  Administered 2020-02-20 – 2020-02-21 (×2): 2.5 mg via ORAL
  Filled 2020-02-20 (×2): qty 1

## 2020-02-20 MED ORDER — ZINC SULFATE 220 (50 ZN) MG PO CAPS
220.0000 mg | ORAL_CAPSULE | Freq: Every day | ORAL | Status: DC
  Administered 2020-02-20 – 2020-02-21 (×2): 220 mg via ORAL
  Filled 2020-02-20 (×2): qty 1

## 2020-02-20 MED ORDER — ADULT MULTIVITAMIN W/MINERALS CH
1.0000 | ORAL_TABLET | Freq: Every day | ORAL | Status: DC
  Administered 2020-02-20 – 2020-02-21 (×2): 1 via ORAL
  Filled 2020-02-20 (×2): qty 1

## 2020-02-20 NOTE — ED Notes (Signed)
Pt not able to ambulate.

## 2020-02-20 NOTE — ED Notes (Signed)
Pt continuously remove BP cuff.  Informed pt that we needed VS frequently but pt refuses, says it hurts.
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
## 2020-02-20 NOTE — ED Notes (Signed)
Pt not able to void at this time.

## 2020-02-20 NOTE — ED Notes (Addendum)
Update given to daughter Izola Price [phone]

## 2020-02-20 NOTE — ED Notes (Signed)
Attending to bedside
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
## 2020-02-20 NOTE — ED Triage Notes (Signed)
Pt from home. C/o of fever, weakness, nausea, and cough x 1 week

## 2020-02-21 DIAGNOSIS — J069 Acute upper respiratory infection, unspecified: Secondary | ICD-10-CM

## 2020-02-21 DIAGNOSIS — U071 COVID-19: Principal | ICD-10-CM

## 2020-02-21 LAB — CBC WITH DIFFERENTIAL/PLATELET
Abs Immature Granulocytes: 0.02 10*3/uL (ref 0.00–0.07)
Basophils Absolute: 0 10*3/uL (ref 0.0–0.1)
Basophils Relative: 0 %
Eosinophils Absolute: 0 10*3/uL (ref 0.0–0.5)
Eosinophils Relative: 0 %
HCT: 37.1 % — ABNORMAL LOW (ref 39.0–52.0)
Hemoglobin: 12.1 g/dL — ABNORMAL LOW (ref 13.0–17.0)
Immature Granulocytes: 1 %
Lymphocytes Relative: 10 %
Lymphs Abs: 0.4 10*3/uL — ABNORMAL LOW (ref 0.7–4.0)
MCH: 29.7 pg (ref 26.0–34.0)
MCHC: 32.6 g/dL (ref 30.0–36.0)
MCV: 90.9 fL (ref 80.0–100.0)
Monocytes Absolute: 0.2 10*3/uL (ref 0.1–1.0)
Monocytes Relative: 5 %
Neutro Abs: 3.4 10*3/uL (ref 1.7–7.7)
Neutrophils Relative %: 84 %
Platelets: 141 10*3/uL — ABNORMAL LOW (ref 150–400)
RBC: 4.08 MIL/uL — ABNORMAL LOW (ref 4.22–5.81)
RDW: 14.6 % (ref 11.5–15.5)
WBC: 4 10*3/uL (ref 4.0–10.5)
nRBC: 0 % (ref 0.0–0.2)

## 2020-02-21 LAB — C-REACTIVE PROTEIN: CRP: 12 mg/dL — ABNORMAL HIGH (ref <1.0)

## 2020-02-21 LAB — COMPREHENSIVE METABOLIC PANEL
ALT: 16 U/L (ref 0–44)
AST: 18 U/L (ref 15–41)
Albumin: 2.7 g/dL — ABNORMAL LOW (ref 3.5–5.0)
Alkaline Phosphatase: 72 U/L (ref 38–126)
Anion gap: 11 (ref 5–15)
BUN: 41 mg/dL — ABNORMAL HIGH (ref 8–23)
CO2: 18 mmol/L — ABNORMAL LOW (ref 22–32)
Calcium: 8 mg/dL — ABNORMAL LOW (ref 8.9–10.3)
Chloride: 107 mmol/L (ref 98–111)
Creatinine, Ser: 2.01 mg/dL — ABNORMAL HIGH (ref 0.61–1.24)
GFR, Estimated: 31 mL/min — ABNORMAL LOW (ref >60)
Glucose, Bld: 228 mg/dL — ABNORMAL HIGH (ref 70–99)
Potassium: 3.9 mmol/L (ref 3.5–5.1)
Sodium: 136 mmol/L (ref 135–145)
Total Bilirubin: 0.5 mg/dL (ref 0.3–1.2)
Total Protein: 6.2 g/dL — ABNORMAL LOW (ref 6.5–8.1)

## 2020-02-21 LAB — D-DIMER, QUANTITATIVE: D-Dimer, Quant: 0.96 ug/mL-FEU — ABNORMAL HIGH (ref 0.00–0.50)

## 2020-02-21 LAB — FERRITIN: Ferritin: 216 ng/mL (ref 24–336)

## 2020-02-21 LAB — GLUCOSE, CAPILLARY
Glucose-Capillary: 213 mg/dL — ABNORMAL HIGH (ref 70–99)
Glucose-Capillary: 223 mg/dL — ABNORMAL HIGH (ref 70–99)

## 2020-02-21 LAB — MAGNESIUM: Magnesium: 1.8 mg/dL (ref 1.7–2.4)

## 2020-02-21 MED ORDER — PREDNISONE 20 MG PO TABS: 40.0000 mg | ORAL_TABLET | Freq: Every day | ORAL | 0 refills | Status: AC

## 2020-02-21 MED ORDER — ASCORBIC ACID 500 MG PO TABS: 500.0000 mg | ORAL_TABLET | Freq: Every day | ORAL | 0 refills | Status: AC

## 2020-02-21 MED ORDER — IPRATROPIUM-ALBUTEROL 20-100 MCG/ACT IN AERS
1.0000 | INHALATION_SPRAY | Freq: Three times a day (TID) | RESPIRATORY_TRACT | Status: DC
  Administered 2020-02-21: 1 via RESPIRATORY_TRACT

## 2020-02-21 MED ORDER — IPRATROPIUM-ALBUTEROL 20-100 MCG/ACT IN AERS: 1.0000 | INHALATION_SPRAY | Freq: Four times a day (QID) | RESPIRATORY_TRACT | 0 refills | Status: AC | PRN

## 2020-02-21 MED ORDER — GUAIFENESIN-DM 100-10 MG/5ML PO SYRP: 10.0000 mL | ORAL_SOLUTION | ORAL | 0 refills | Status: AC | PRN

## 2020-02-21 MED ORDER — ZINC SULFATE 220 (50 ZN) MG PO CAPS: 220.0000 mg | ORAL_CAPSULE | Freq: Every day | ORAL | 0 refills | Status: AC

## 2020-02-21 NOTE — Care Management CC44 (Signed)
Condition Code 44 Documentation Completed  Patient Details  Name: Don Hall. MRN: [id number] Date of Birth: 1927-12-14   Condition Code 44 given:  Yes Patient signature on Condition Code 44 notice:  Yes Documentation of 2 MD's agreement:  Yes Code 44 added to claim:  Yes    Sherie Don, LCSW 02/21/2020, 11:58 AM

## 2020-02-21 NOTE — Discharge Summary (Signed)
Physician Discharge Summary  Don Hall. KXF:[id number] DOB: 1928/01/18 DOA: 02/20/2020  PCP: Don Bis, MD  Admit date: 02/20/2020  Discharge date: 02/21/2020  Admitted From:Home  Disposition:  Home  Recommendations for Outpatient Follow-up:  1. Follow up with PCP in 1-2 weeks 2. Continue on steroids as ordered for 5 more days 3. Continue on inhalers and cough meds prn  Home Health:None  Equipment/Devices:None  Discharge Condition:Stable  CODE STATUS: DNR  Diet recommendation: Heart Healthy  Brief/Interim Summary: Per HPI: Don Hall  is a 86 y.o. male history of diabetes, atrial fibrillation and DVT on Eliquis, CKDIV, hypertension, hard of hearing presenting from home with a 2-day history of generalized weakness, nausea, fever and chills and cough.  Patient and his daughter states he has been ill for 2 days.   --Patient had nausea and vomiting x1 this a.m. without blood, no diarrhea Don Hall of pt's 28 yo wife was on Tuesday 02/18/20--- family does not think the patient's wife had Covid, she was under hospice care after being discharged from the hospital status post hip fracture replacement, with severe aortic stenosis  Pt completed Moderna last week of March 2021, did Not receive Booster yet --Pt received MAB in ED on 02/20/20 -Pt's symptoms mostly cough and weakness since 02/18/20--  D/w Patient and Daughter-POA- Don Hall daughter [phone]---FB is a DNR - -Denies chest pains or palpitations -At baseline patient gets around with a walker for long distances he uses a wheelchair In ED--hemoglobin 11.1 which is close to patient's baseline, creatinine is 2.1-which is close to patient's baseline -Fibrinogen is elevated at 679, CRP elevated at 8.4, D-dimer elevated at 2.2 -Chest x-ray consistent with pneumonia presumably secondary to Covid infection EDP requested admission to the hospital due to advanced age comorbid conditions and risk for rapid  decompensation  -Patient was admitted with acute COVID-19 infection with pneumonia, but no significant hypoxemia noted.  He had received monoclonal antibodies in the ED on 12/23 and he is overall doing quite well this morning without any significant symptoms.  He remains comfortable on room air with no respiratory distress or cough.  He was started on some steroids and remdesivir overnight since it was felt that the patient may decompensate.  He is however doing quite well and appears stable for discharge today with no further need for remdesivir and he will remain on 5 more days of steroids as prescribed.  He is inflammatory markers are stable and he can follow-up with his PCP in the very near future.  Discharge Diagnoses:  Principal Problem:   Acute respiratory disease due to COVID-19 virus Active Problems:   Essential hypertension, benign   Paroxysmal atrial fibrillation (HCC)   Long term (current) use of anticoagulants   Anemia of chronic disease   CKD (chronic kidney disease), stage IV ([place])  Principal discharge diagnosis: Acute COVID-19 infection/pneumonia.  Discharge Instructions  Discharge Instructions    Diet - low sodium heart healthy   Complete by: As directed    Increase activity slowly   Complete by: As directed      Allergies as of 02/21/2020      Reactions   Simvastatin Other (See Comments)   Myalgias Myalgias      Medication List    TAKE these medications   acetaminophen 325 MG tablet Commonly known as: TYLENOL Take 650 mg by mouth every 6 (six) hours as needed for mild pain.   apixaban 2.5 MG Tabs tablet Commonly known as: ELIQUIS Take 1 tablet (  2.5 mg total) by mouth 2 (two) times daily.   ascorbic acid 500 MG tablet Commonly known as: VITAMIN C Take 1 tablet (500 mg total) by mouth daily for 15 days. Start taking on: February 22, 2020   collagenase ointment Commonly known as: SANTYL Apply topically daily. Apply Santyl to right heel wound Q day,  then cover with moist gauze 2X2 and foam dressing.  (Change foam dressing Q 3 days or PRN soiling.)   fludrocortisone 0.1 MG tablet Commonly known as: FLORINEF Take 1 tablet (0.1 mg total) by mouth daily.   guaiFENesin-dextromethorphan 100-10 MG/5ML syrup Commonly known as: ROBITUSSIN DM Take 10 mLs by mouth every 4 (four) hours as needed for cough.   Ipratropium-Albuterol 20-100 MCG/ACT Aers respimat Commonly known as: COMBIVENT Inhale 1 puff into the lungs every 6 (six) hours as needed for wheezing or shortness of breath.   lactose free nutrition Liqd Take 237 mLs by mouth 2 (two) times daily between meals.   neomycin-bacitracin-polymyxin Oint Commonly known as: NEOSPORIN Apply 1 application topically daily. Apply Neosporin to edges of left leg wound Q day, then cover with nonadherent dressing and kerlex   predniSONE 20 MG tablet Commonly known as: Deltasone Take 2 tablets (40 mg total) by mouth daily for 5 days.   zinc sulfate 220 (50 Zn) MG capsule Take 1 capsule (220 mg total) by mouth daily for 15 days. Start taking on: February 22, 2020       Follow-up Information    Don Bis, MD Follow up in 2 week(s).   Specialty: Family Medicine Contact information: [address] [place] [address] [phone]              Allergies  Allergen Reactions  . Simvastatin Other (See Comments)    Myalgias Myalgias     Consultations:  None   Procedures/Studies: DG Chest Port 1 View  Result Date: 02/20/2020 CLINICAL DATA:  Fever and cough EXAM: PORTABLE CHEST 1 VIEW COMPARISON:  May 21, 2017 FINDINGS: There is airspace opacity in the left base with small left pleural effusion. There is apparent scarring in the medial left upper lobe. There is interstitial thickening throughout the lungs, stable. Heart is upper normal in size with pulmonary vascularity normal. No adenopathy. There is degenerative change in each shoulder. IMPRESSION: Airspace opacity consistent  with pneumonia left base with small left pleural effusion. Scarring medial left upper lobe, stable. Interstitial thickening likely represents a degree of chronic bronchitis. Stable cardiac silhouette. Aortic Atherosclerosis (ICD10-I70.0). Electronically Signed   By: Lowella Grip III M.D.   On: 02/20/2020 09:08      Discharge Exam: Vitals:   02/21/20 0544 02/21/20 0938  BP: (!) 162/84   Pulse: 80   Resp: (!) 22   Temp: 97.9 F (36.6 C)   SpO2: 95% 95%   Vitals:   02/20/20 2119 02/21/20 0048 02/21/20 0544 02/21/20 0938  BP: (!) 180/82  (!) 162/84   Pulse: 79  80   Resp: (!) 22  (!) 22   Temp: 97.9 F (36.6 C)  97.9 F (36.6 C)   TempSrc: Oral  Oral   SpO2: 97% 94% 95% 95%  Weight:      Height:        General: Pt is alert, awake, not in acute distress Cardiovascular: RRR, S1/S2 +, no rubs, no gallops Respiratory: CTA bilaterally, no wheezing, no rhonchi Abdominal: Soft, NT, ND, bowel sounds + Extremities: no edema, no cyanosis    The results of  significant diagnostics from this hospitalization (including imaging, microbiology, ancillary and laboratory) are listed below for reference.     Microbiology: Recent Results (from the past 240 hour(s))  Resp Panel by RT-PCR (Flu A&B, Covid) Nasopharyngeal Swab     Status: Abnormal   Collection Time: 02/20/20  8:41 AM   Specimen: Nasopharyngeal Swab; Nasopharyngeal(NP) swabs in vial transport medium  Result Value Ref Range Status   SARS Coronavirus 2 by RT PCR POSITIVE (A) NEGATIVE Final    Comment: CRITICAL RESULT CALLED TO, READ BACK BY AND VERIFIED WITH: DOSS, M 12/23 @ 0950 BY BEARD, S. (NOTE) SARS-CoV-2 target nucleic acids are DETECTED.  The SARS-CoV-2 RNA is generally detectable in upper respiratory specimens during the acute phase of infection. Positive results are indicative of the presence of the identified virus, but do not rule out bacterial infection or co-infection with other pathogens not detected by the  test. Clinical correlation with patient history and other diagnostic information is necessary to determine patient infection status. The expected result is Negative.  Fact Sheet for Patients: EntrepreneurPulse.com.au  Fact Sheet for Healthcare Providers: IncredibleEmployment.be  This test is not yet approved or cleared by the [place] FDA and  has been authorized for detection and/or diagnosis of SARS-CoV-2 by FDA under an Emergency Use Authorization (EUA).  This EUA will remain in effect (meaning this te st can be used) for the duration of  the COVID-19 declaration under Section 564(b)(1) of the Act, 21 U.S.C. section 360bbb-3(b)(1), unless the authorization is terminated or revoked sooner.     Influenza A by PCR NEGATIVE NEGATIVE Final   Influenza B by PCR NEGATIVE NEGATIVE Final    Comment: (NOTE) The Xpert Xpress SARS-CoV-2/FLU/RSV plus assay is intended as an aid in the diagnosis of influenza from Nasopharyngeal swab specimens and should not be used as a sole basis for treatment. Nasal washings and aspirates are unacceptable for Xpert Xpress SARS-CoV-2/FLU/RSV testing.  Fact Sheet for Patients: EntrepreneurPulse.com.au  Fact Sheet for Healthcare Providers: IncredibleEmployment.be  This test is not yet approved or cleared by the [place] FDA and has been authorized for detection and/or diagnosis of SARS-CoV-2 by FDA under an Emergency Use Authorization (EUA). This EUA will remain in effect (meaning this test can be used) for the duration of the COVID-19 declaration under Section 564(b)(1) of the Act, 21 U.S.C. section 360bbb-3(b)(1), unless the authorization is terminated or revoked.  Performed at [facility], [address], [place], [place] [address]   Blood Culture (routine x 2)     Status: None (Preliminary result)   Collection Time: 02/20/20  9:29 AM   Specimen: BLOOD  Result Value  Ref Range Status   Specimen Description BLOOD BLOOD LEFT FOREARM  Final   Special Requests   Final    BOTTLES DRAWN AEROBIC AND ANAEROBIC Blood Culture adequate volume   Culture   Final    NO GROWTH < 24 HOURS Performed at [facility], [address], [place], [place] [address]    Report Status PENDING  Incomplete  Blood Culture (routine x 2)     Status: None (Preliminary result)   Collection Time: 02/20/20  9:35 AM   Specimen: BLOOD  Result Value Ref Range Status   Specimen Description BLOOD BLOOD LEFT HAND  Final   Special Requests   Final    BOTTLES DRAWN AEROBIC AND ANAEROBIC Blood Culture adequate volume   Culture   Final    NO GROWTH < 24 HOURS Performed at [facility], [address], [place], [place] [address]    Report Status PENDING  Incomplete     Labs: BNP (last 3 results) No results for input(s): BNP in the last 8760 hours. Basic Metabolic Panel: Recent Labs  Lab 02/20/20 0926 02/21/20 0629  NA 133* 136  K 3.8 3.9  CL 104 107  CO2 21* 18*  GLUCOSE 146* 228*  BUN 38* 41*  CREATININE 2.16* 2.01*  CALCIUM 7.5* 8.0*  MG  --  1.8   Liver Function Tests: Recent Labs  Lab 02/20/20 0926 02/21/20 0629  AST 18 18  ALT 16 16  ALKPHOS 74 72  BILITOT 0.6 0.5  PROT 6.4* 6.2*  ALBUMIN 2.9* 2.7*   No results for input(s): LIPASE, AMYLASE in the last 168 hours. No results for input(s): AMMONIA in the last 168 hours. CBC: Recent Labs  Lab 02/20/20 0926 02/21/20 0629  WBC 4.6 4.0  NEUTROABS 3.7 3.4  HGB 11.1* 12.1*  HCT 34.3* 37.1*  MCV 91.0 90.9  PLT 143* 141*   Cardiac Enzymes: No results for input(s): CKTOTAL, CKMB, CKMBINDEX, TROPONINI in the last 168 hours. BNP: Invalid input(s): POCBNP CBG: Recent Labs  Lab 02/20/20 1728 02/20/20 2116 02/21/20 0750  GLUCAP 159* 300* 213*   D-Dimer Recent Labs    02/20/20 1010 02/21/20 0629  DDIMER 2.21* 0.96*   Hgb A1c No results for input(s): HGBA1C in the last 72 hours. Lipid Profile Recent  Labs    02/20/20 1010  TRIG 43   Thyroid function studies No results for input(s): TSH, T4TOTAL, T3FREE, THYROIDAB in the last 72 hours.  Invalid input(s): FREET3 Anemia work up Recent Labs    02/20/20 1010 02/21/20 0629  FERRITIN 188 216   Urinalysis    Component Value Date/Time   COLORURINE YELLOW 02/20/2020 1333   APPEARANCEUR CLEAR 02/20/2020 1333   LABSPEC 1.017 02/20/2020 1333   PHURINE 5.0 02/20/2020 1333   GLUCOSEU 50 (A) 02/20/2020 1333   HGBUR SMALL (A) 02/20/2020 1333   BILIRUBINUR NEGATIVE 02/20/2020 1333   KETONESUR NEGATIVE 02/20/2020 1333   PROTEINUR >=300 (A) 02/20/2020 1333   UROBILINOGEN 0.2 02/08/2011 1428   NITRITE NEGATIVE 02/20/2020 1333   LEUKOCYTESUR NEGATIVE 02/20/2020 1333   Sepsis Labs Invalid input(s): PROCALCITONIN,  WBC,  LACTICIDVEN Microbiology Recent Results (from the past 240 hour(s))  Resp Panel by RT-PCR (Flu A&B, Covid) Nasopharyngeal Swab     Status: Abnormal   Collection Time: 02/20/20  8:41 AM   Specimen: Nasopharyngeal Swab; Nasopharyngeal(NP) swabs in vial transport medium  Result Value Ref Range Status   SARS Coronavirus 2 by RT PCR POSITIVE (A) NEGATIVE Final    Comment: CRITICAL RESULT CALLED TO, READ BACK BY AND VERIFIED WITH: DOSS, M 12/23 @ 0950 BY BEARD, S. (NOTE) SARS-CoV-2 target nucleic acids are DETECTED.  The SARS-CoV-2 RNA is generally detectable in upper respiratory specimens during the acute phase of infection. Positive results are indicative of the presence of the identified virus, but do not rule out bacterial infection or co-infection with other pathogens not detected by the test. Clinical correlation with patient history and other diagnostic information is necessary to determine patient infection status. The expected result is Negative.  Fact Sheet for Patients: EntrepreneurPulse.com.au  Fact Sheet for Healthcare Providers: IncredibleEmployment.be  This test is  not yet approved or cleared by the [place] FDA and  has been authorized for detection and/or diagnosis of SARS-CoV-2 by FDA under an Emergency Use Authorization (EUA).  This EUA will remain in effect (meaning this te  st can be used) for the duration of  the COVID-19 declaration under Section 564(b)(1) of the Act, 21 U.S.C. section 360bbb-3(b)(1), unless the authorization is terminated or revoked sooner.     Influenza A by PCR NEGATIVE NEGATIVE Final   Influenza B by PCR NEGATIVE NEGATIVE Final    Comment: (NOTE) The Xpert Xpress SARS-CoV-2/FLU/RSV plus assay is intended as an aid in the diagnosis of influenza from Nasopharyngeal swab specimens and should not be used as a sole basis for treatment. Nasal washings and aspirates are unacceptable for Xpert Xpress SARS-CoV-2/FLU/RSV testing.  Fact Sheet for Patients: EntrepreneurPulse.com.au  Fact Sheet for Healthcare Providers: IncredibleEmployment.be  This test is not yet approved or cleared by the [place] FDA and has been authorized for detection and/or diagnosis of SARS-CoV-2 by FDA under an Emergency Use Authorization (EUA). This EUA will remain in effect (meaning this test can be used) for the duration of the COVID-19 declaration under Section 564(b)(1) of the Act, 21 U.S.C. section 360bbb-3(b)(1), unless the authorization is terminated or revoked.  Performed at [facility], [address], [place], [place] [address]   Blood Culture (routine x 2)     Status: None (Preliminary result)   Collection Time: 02/20/20  9:29 AM   Specimen: BLOOD  Result Value Ref Range Status   Specimen Description BLOOD BLOOD LEFT FOREARM  Final   Special Requests   Final    BOTTLES DRAWN AEROBIC AND ANAEROBIC Blood Culture adequate volume   Culture   Final    NO GROWTH < 24 HOURS Performed at [facility], [address], [place], [place] [address]    Report Status PENDING  Incomplete  Blood  Culture (routine x 2)     Status: None (Preliminary result)   Collection Time: 02/20/20  9:35 AM   Specimen: BLOOD  Result Value Ref Range Status   Specimen Description BLOOD BLOOD LEFT HAND  Final   Special Requests   Final    BOTTLES DRAWN AEROBIC AND ANAEROBIC Blood Culture adequate volume   Culture   Final    NO GROWTH < 24 HOURS Performed at [facility], [address] [place], [place] [address]    Report Status PENDING  Incomplete     Time coordinating discharge: 35 minutes  SIGNED:   Rodena Goldmann, DO [facility] 02/21/2020, 10:48 AM  If 7PM-7AM, please contact night-coverage [email or web address]

## 2020-02-21 NOTE — Progress Notes (Signed)
Nsg Discharge Note  Admit Date:  02/20/2020 Discharge date: 02/21/2020   Don Hall. to be D/C'd Home per MD order.  AVS completed.  Patient's daughter Sheran Luz able to verbalize understanding.  Discharge Medication: Allergies as of 02/21/2020      Reactions   Simvastatin Other (See Comments)   Myalgias Myalgias      Medication List    TAKE these medications   acetaminophen 325 MG tablet Commonly known as: TYLENOL Take 650 mg by mouth every 6 (six) hours as needed for mild pain.   apixaban 2.5 MG Tabs tablet Commonly known as: ELIQUIS Take 1 tablet (2.5 mg total) by mouth 2 (two) times daily.   ascorbic acid 500 MG tablet Commonly known as: VITAMIN C Take 1 tablet (500 mg total) by mouth daily for 15 days. Start taking on: February 22, 2020   collagenase ointment Commonly known as: SANTYL Apply topically daily. Apply Santyl to right heel wound Q day, then cover with moist gauze 2X2 and foam dressing.  (Change foam dressing Q 3 days or PRN soiling.)   fludrocortisone 0.1 MG tablet Commonly known as: FLORINEF Take 1 tablet (0.1 mg total) by mouth daily.   guaiFENesin-dextromethorphan 100-10 MG/5ML syrup Commonly known as: ROBITUSSIN DM Take 10 mLs by mouth every 4 (four) hours as needed for cough.   Ipratropium-Albuterol 20-100 MCG/ACT Aers respimat Commonly known as: COMBIVENT Inhale 1 puff into the lungs every 6 (six) hours as needed for wheezing or shortness of breath.   lactose free nutrition Liqd Take 237 mLs by mouth 2 (two) times daily between meals.   neomycin-bacitracin-polymyxin Oint Commonly known as: NEOSPORIN Apply 1 application topically daily. Apply Neosporin to edges of left leg wound Q day, then cover with nonadherent dressing and kerlex   predniSONE 20 MG tablet Commonly known as: Deltasone Take 2 tablets (40 mg total) by mouth daily for 5 days.   zinc sulfate 220 (50 Zn) MG capsule Take 1 capsule (220 mg total) by mouth daily  for 15 days. Start taking on: February 22, 2020       Discharge Assessment: Vitals:   02/21/20 0544 02/21/20 0938  BP: (!) 162/84   Pulse: 80   Resp: (!) 22   Temp: 97.9 F (36.6 C)   SpO2: 95% 95%   Skin clean, dry and intact without evidence of skin break down, no evidence of skin tears noted. IV catheter discontinued intact. Site without signs and symptoms of complications - no redness or edema noted at insertion site, patient denies c/o pain - only slight tenderness at site.  Dressing with slight pressure applied.  D/c Instructions-Education: Discharge instructions given to patient's daughter Sheran Luz with verbalized understanding. D/c education completed with patient's daughter  including follow up instructions, medication list, d/c activities limitations if indicated, with other d/c instructions as indicated by MD - patient able to verbalize understanding, all questions fully answered. Patient instructed to return to ED, call 911, or call MD for any changes in condition.  Patient escorted via [place], and D/C home via private auto.  Kathleen Likins C, RN 02/21/2020 1:16 PM

## 2020-02-21 NOTE — Care Management Obs Status (Signed)
[place] NOTIFICATION   Patient Details  Name: Don Hall. MRN: [id number] Date of Birth: 17-May-1927   Medicare Observation Status Notification Given:  Yes    Sherie Don, LCSW 02/21/2020, 11:58 AM

## 2020-02-21 NOTE — Care Management Important Message (Signed)
Important Message  Patient Details  Name: Don Hall. MRN: [id number] Date of Birth: [birth year]   Medicare Important Message Given:  Yes     Sherie Don, LCSW 02/21/2020, 11:58 AM

## 2020-02-22 LAB — URINE CULTURE: Culture: <10000 — AB

## 2020-02-25 LAB — CULTURE, BLOOD (ROUTINE X 2)
Culture: NO GROWTH
Culture: NO GROWTH
Special Requests: ADEQUATE
Special Requests: ADEQUATE

## 2020-02-26 DIAGNOSIS — L97229 Non-pressure chronic ulcer of left calf with unspecified severity: Secondary | ICD-10-CM | POA: Diagnosis not present

## 2020-02-26 DIAGNOSIS — I872 Venous insufficiency (chronic) (peripheral): Secondary | ICD-10-CM | POA: Diagnosis not present

## 2020-02-26 DIAGNOSIS — T86821 Skin graft (allograft) (autograft) failure: Secondary | ICD-10-CM | POA: Diagnosis not present

## 2020-02-26 DIAGNOSIS — Z4801 Encounter for change or removal of surgical wound dressing: Secondary | ICD-10-CM | POA: Diagnosis not present

## 2020-02-26 DIAGNOSIS — L97929 Non-pressure chronic ulcer of unspecified part of left lower leg with unspecified severity: Secondary | ICD-10-CM | POA: Diagnosis not present

## 2020-02-26 DIAGNOSIS — Z7901 Long term (current) use of anticoagulants: Secondary | ICD-10-CM | POA: Diagnosis not present

## 2020-02-28 DIAGNOSIS — I872 Venous insufficiency (chronic) (peripheral): Secondary | ICD-10-CM | POA: Diagnosis not present

## 2020-02-28 DIAGNOSIS — L97929 Non-pressure chronic ulcer of unspecified part of left lower leg with unspecified severity: Secondary | ICD-10-CM | POA: Diagnosis not present

## 2020-02-28 DIAGNOSIS — Z7901 Long term (current) use of anticoagulants: Secondary | ICD-10-CM | POA: Diagnosis not present

## 2020-02-28 DIAGNOSIS — Z4801 Encounter for change or removal of surgical wound dressing: Secondary | ICD-10-CM | POA: Diagnosis not present

## 2020-02-28 DIAGNOSIS — T86821 Skin graft (allograft) (autograft) failure: Secondary | ICD-10-CM | POA: Diagnosis not present

## 2020-02-28 DIAGNOSIS — L97229 Non-pressure chronic ulcer of left calf with unspecified severity: Secondary | ICD-10-CM | POA: Diagnosis not present

## 2020-03-04 DIAGNOSIS — L97229 Non-pressure chronic ulcer of left calf with unspecified severity: Secondary | ICD-10-CM | POA: Diagnosis not present

## 2020-03-04 DIAGNOSIS — Z7901 Long term (current) use of anticoagulants: Secondary | ICD-10-CM | POA: Diagnosis not present

## 2020-03-04 DIAGNOSIS — L97929 Non-pressure chronic ulcer of unspecified part of left lower leg with unspecified severity: Secondary | ICD-10-CM | POA: Diagnosis not present

## 2020-03-04 DIAGNOSIS — T86821 Skin graft (allograft) (autograft) failure: Secondary | ICD-10-CM | POA: Diagnosis not present

## 2020-03-04 DIAGNOSIS — Z4801 Encounter for change or removal of surgical wound dressing: Secondary | ICD-10-CM | POA: Diagnosis not present

## 2020-03-04 DIAGNOSIS — I872 Venous insufficiency (chronic) (peripheral): Secondary | ICD-10-CM | POA: Diagnosis not present

## 2020-03-06 DIAGNOSIS — I872 Venous insufficiency (chronic) (peripheral): Secondary | ICD-10-CM | POA: Diagnosis not present

## 2020-03-06 DIAGNOSIS — L97229 Non-pressure chronic ulcer of left calf with unspecified severity: Secondary | ICD-10-CM | POA: Diagnosis not present

## 2020-03-06 DIAGNOSIS — Z7901 Long term (current) use of anticoagulants: Secondary | ICD-10-CM | POA: Diagnosis not present

## 2020-03-06 DIAGNOSIS — T86821 Skin graft (allograft) (autograft) failure: Secondary | ICD-10-CM | POA: Diagnosis not present

## 2020-03-06 DIAGNOSIS — L97929 Non-pressure chronic ulcer of unspecified part of left lower leg with unspecified severity: Secondary | ICD-10-CM | POA: Diagnosis not present

## 2020-03-06 DIAGNOSIS — Z4801 Encounter for change or removal of surgical wound dressing: Secondary | ICD-10-CM | POA: Diagnosis not present

## 2020-03-10 DIAGNOSIS — Z7901 Long term (current) use of anticoagulants: Secondary | ICD-10-CM | POA: Diagnosis not present

## 2020-03-10 DIAGNOSIS — L97929 Non-pressure chronic ulcer of unspecified part of left lower leg with unspecified severity: Secondary | ICD-10-CM | POA: Diagnosis not present

## 2020-03-10 DIAGNOSIS — Z4801 Encounter for change or removal of surgical wound dressing: Secondary | ICD-10-CM | POA: Diagnosis not present

## 2020-03-10 DIAGNOSIS — I872 Venous insufficiency (chronic) (peripheral): Secondary | ICD-10-CM | POA: Diagnosis not present

## 2020-03-10 DIAGNOSIS — L97229 Non-pressure chronic ulcer of left calf with unspecified severity: Secondary | ICD-10-CM | POA: Diagnosis not present

## 2020-03-10 DIAGNOSIS — T86821 Skin graft (allograft) (autograft) failure: Secondary | ICD-10-CM | POA: Diagnosis not present

## 2020-03-13 DIAGNOSIS — I872 Venous insufficiency (chronic) (peripheral): Secondary | ICD-10-CM | POA: Diagnosis not present

## 2020-03-13 DIAGNOSIS — Z7901 Long term (current) use of anticoagulants: Secondary | ICD-10-CM | POA: Diagnosis not present

## 2020-03-13 DIAGNOSIS — Z4801 Encounter for change or removal of surgical wound dressing: Secondary | ICD-10-CM | POA: Diagnosis not present

## 2020-03-13 DIAGNOSIS — T86821 Skin graft (allograft) (autograft) failure: Secondary | ICD-10-CM | POA: Diagnosis not present

## 2020-03-13 DIAGNOSIS — L97929 Non-pressure chronic ulcer of unspecified part of left lower leg with unspecified severity: Secondary | ICD-10-CM | POA: Diagnosis not present

## 2020-03-13 DIAGNOSIS — L97229 Non-pressure chronic ulcer of left calf with unspecified severity: Secondary | ICD-10-CM | POA: Diagnosis not present

## 2020-03-15 IMAGING — CT CT HEAD W/O CM
3 series · 15 of 47 positions shown, 18 images · non-contrast
Comparison: Head CT without contrast 04/23/2017 and earlier.

CLINICAL DATA: 89-year-old male with syncope, hypotension. Vomiting
and abdominal pain.

EXAM:
CT HEAD WITHOUT CONTRAST
TECHNIQUE: Contiguous axial images were obtained from the base of the skull
through the vertex without intravenous contrast.

[Series 2: head wo · axial · 0.45mm/px · z∈[-210,-80]mm · 9 of 32 slices shown, 12 images]
[im 3/32  brain]
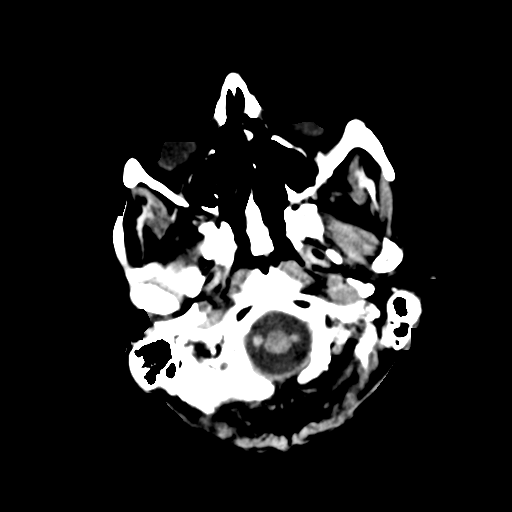
[im 3/32  bone]
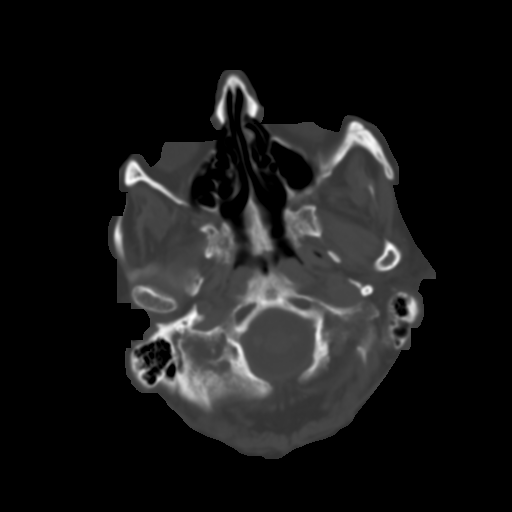
[im 6/32  brain]
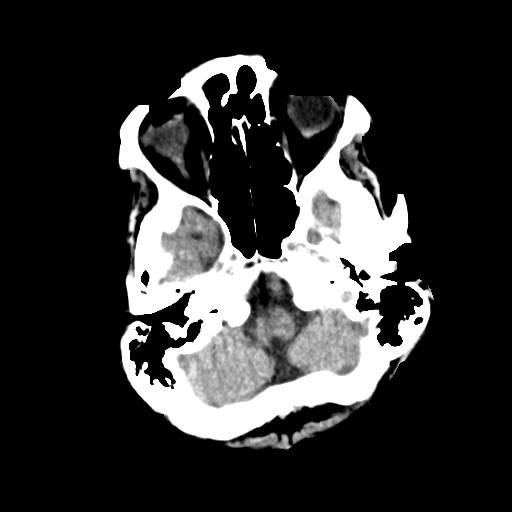
[im 9/32  brain]
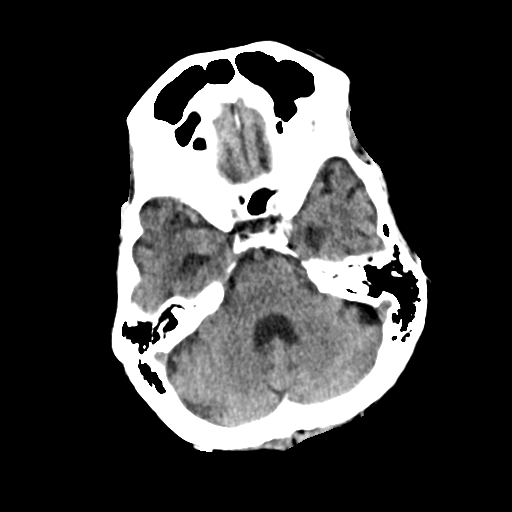
[im 12/32  brain]
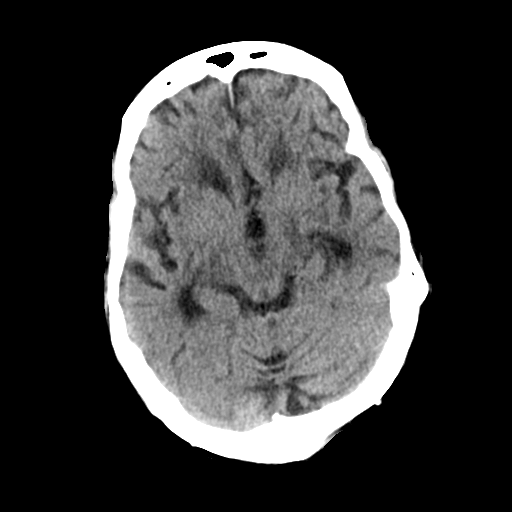
[im 17/32  brain]
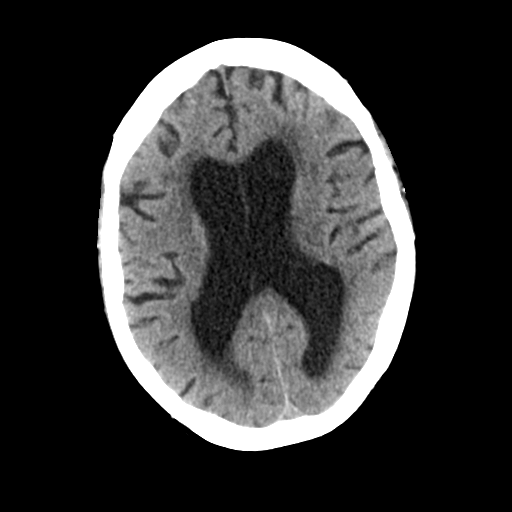
[im 17/32  bone]
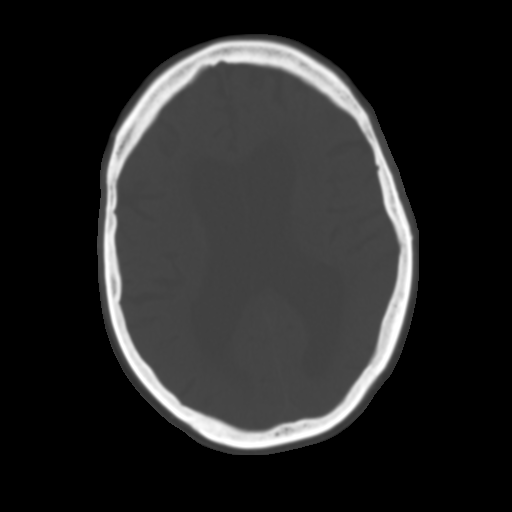
[im 20/32  brain]
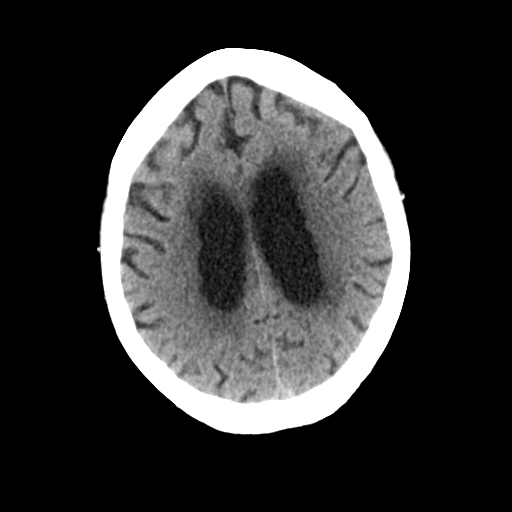
[im 23/32  brain]
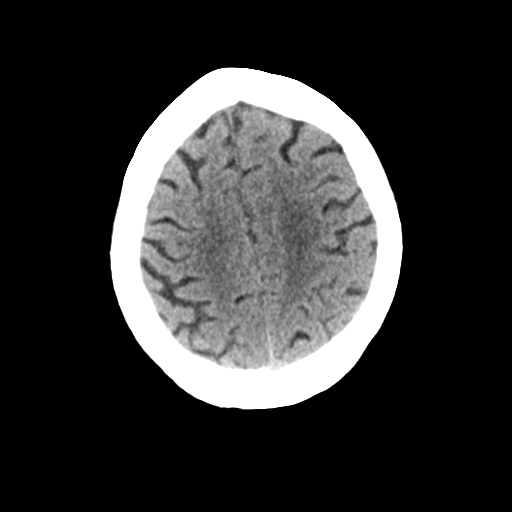
[im 26/32  brain]
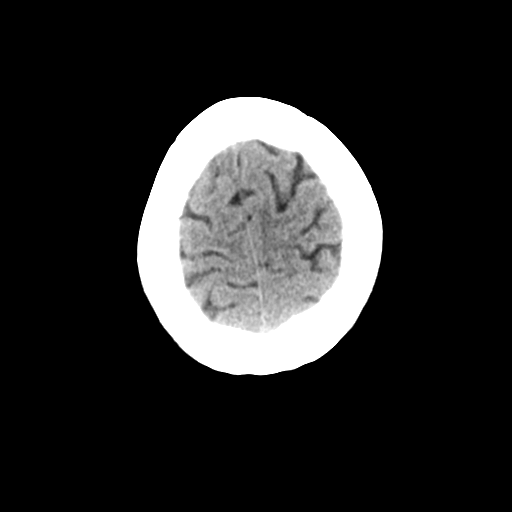
[im 29/32  brain]
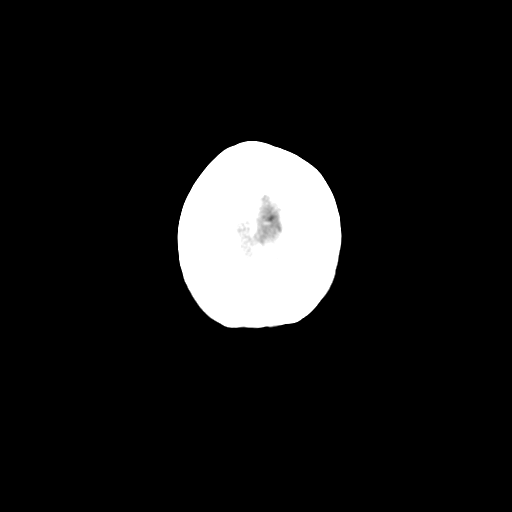
[im 29/32  bone]
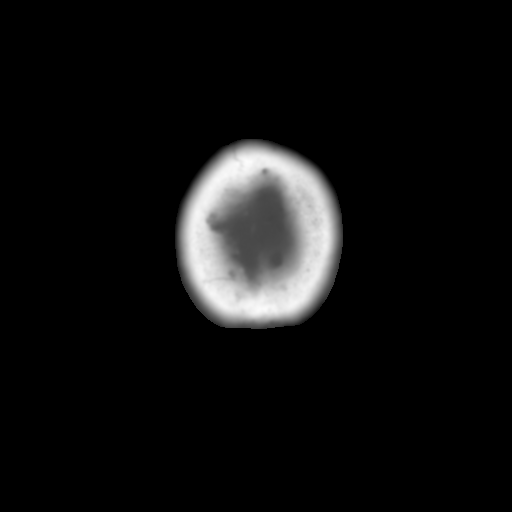

[Series 4: coronal soft tissue · coronal · 0.29mm/px · 3 of 67 slices shown]
[im 23/67  brain]
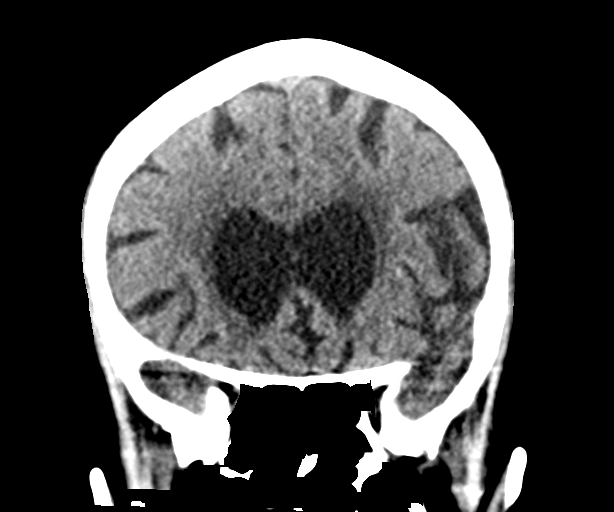
[im 30/67  brain]
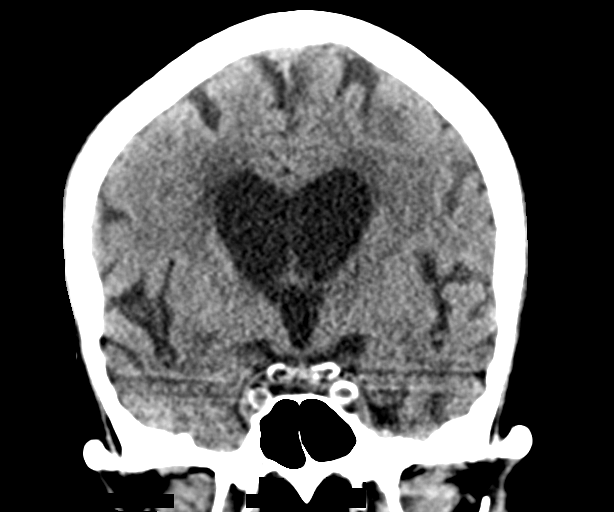
[im 37/67  brain]
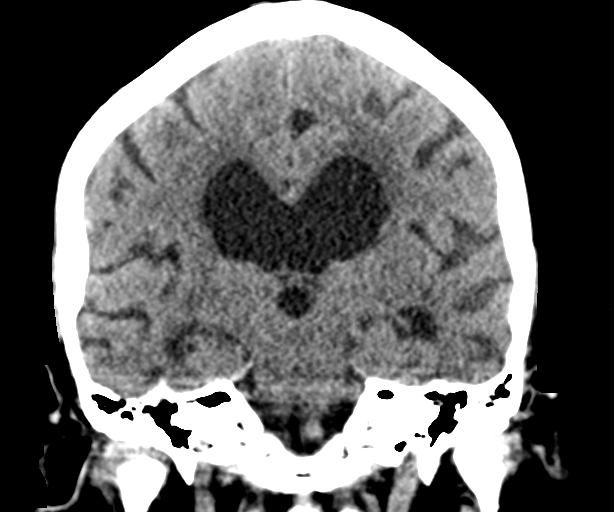

[Series 5: sagittal soft tissue · sagittal · 0.36mm/px · 3 of 59 slices shown]
[im 20/59  brain]
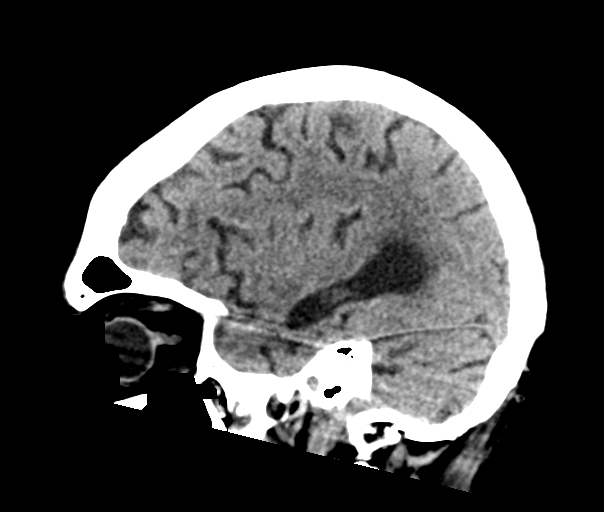
[im 30/59  brain]
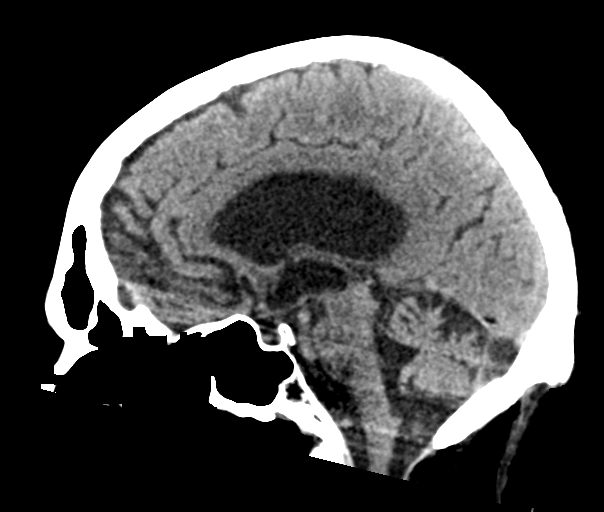
[im 39/59  brain]
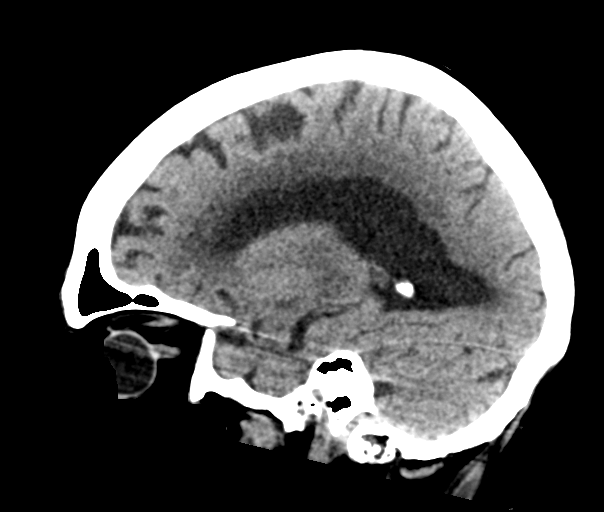

[15 of 47 positions shown; findings below may reference images not displayed]

FINDINGS: Brain: Chronic ventriculomegaly and cerebral volume appears stable.
Mild patchy bilateral periventricular white matter hypodensity
appears stable.

No midline shift, ventriculomegaly, mass effect, evidence of mass
lesion, intracranial hemorrhage or evidence of cortically based
acute infarction.

No cortical encephalomalacia identified.

Vascular: Mild Calcified atherosclerosis at the skull base. No
suspicious intracranial vascular hyperdensity.

Skull: Stable and negative. No acute osseous abnormality identified.

Sinuses/Orbits: Visualized paranasal sinuses and mastoids are stable
and well pneumatized.

Other: No acute orbit or scalp soft tissue finding.
IMPRESSION: 1.  No acute intracranial abnormality.
2.  Stable non contrast CT appearance of the brain since [DATE].

## 2020-03-16 DIAGNOSIS — L97229 Non-pressure chronic ulcer of left calf with unspecified severity: Secondary | ICD-10-CM | POA: Diagnosis not present

## 2020-03-16 DIAGNOSIS — L97929 Non-pressure chronic ulcer of unspecified part of left lower leg with unspecified severity: Secondary | ICD-10-CM | POA: Diagnosis not present

## 2020-03-16 DIAGNOSIS — Z4801 Encounter for change or removal of surgical wound dressing: Secondary | ICD-10-CM | POA: Diagnosis not present

## 2020-03-16 DIAGNOSIS — T86821 Skin graft (allograft) (autograft) failure: Secondary | ICD-10-CM | POA: Diagnosis not present

## 2020-03-16 DIAGNOSIS — I872 Venous insufficiency (chronic) (peripheral): Secondary | ICD-10-CM | POA: Diagnosis not present

## 2020-03-16 DIAGNOSIS — Z7901 Long term (current) use of anticoagulants: Secondary | ICD-10-CM | POA: Diagnosis not present

## 2020-03-17 DIAGNOSIS — L97229 Non-pressure chronic ulcer of left calf with unspecified severity: Secondary | ICD-10-CM | POA: Diagnosis not present

## 2020-03-17 DIAGNOSIS — T86821 Skin graft (allograft) (autograft) failure: Secondary | ICD-10-CM | POA: Diagnosis not present

## 2020-03-17 DIAGNOSIS — I872 Venous insufficiency (chronic) (peripheral): Secondary | ICD-10-CM | POA: Diagnosis not present

## 2020-03-17 DIAGNOSIS — Z4801 Encounter for change or removal of surgical wound dressing: Secondary | ICD-10-CM | POA: Diagnosis not present

## 2020-03-17 DIAGNOSIS — L97929 Non-pressure chronic ulcer of unspecified part of left lower leg with unspecified severity: Secondary | ICD-10-CM | POA: Diagnosis not present

## 2020-03-17 DIAGNOSIS — Z7901 Long term (current) use of anticoagulants: Secondary | ICD-10-CM | POA: Diagnosis not present

## 2020-03-20 DIAGNOSIS — L97929 Non-pressure chronic ulcer of unspecified part of left lower leg with unspecified severity: Secondary | ICD-10-CM | POA: Diagnosis not present

## 2020-03-20 DIAGNOSIS — Z7901 Long term (current) use of anticoagulants: Secondary | ICD-10-CM | POA: Diagnosis not present

## 2020-03-20 DIAGNOSIS — L97229 Non-pressure chronic ulcer of left calf with unspecified severity: Secondary | ICD-10-CM | POA: Diagnosis not present

## 2020-03-20 DIAGNOSIS — I872 Venous insufficiency (chronic) (peripheral): Secondary | ICD-10-CM | POA: Diagnosis not present

## 2020-03-20 DIAGNOSIS — Z4801 Encounter for change or removal of surgical wound dressing: Secondary | ICD-10-CM | POA: Diagnosis not present

## 2020-03-20 DIAGNOSIS — T86821 Skin graft (allograft) (autograft) failure: Secondary | ICD-10-CM | POA: Diagnosis not present

## 2020-03-22 DIAGNOSIS — Z20828 Contact with and (suspected) exposure to other viral communicable diseases: Secondary | ICD-10-CM | POA: Diagnosis not present

## 2020-03-22 DIAGNOSIS — I482 Chronic atrial fibrillation, unspecified: Secondary | ICD-10-CM | POA: Diagnosis not present

## 2020-03-24 DIAGNOSIS — T86821 Skin graft (allograft) (autograft) failure: Secondary | ICD-10-CM | POA: Diagnosis not present

## 2020-03-24 DIAGNOSIS — L97229 Non-pressure chronic ulcer of left calf with unspecified severity: Secondary | ICD-10-CM | POA: Diagnosis not present

## 2020-03-24 DIAGNOSIS — I872 Venous insufficiency (chronic) (peripheral): Secondary | ICD-10-CM | POA: Diagnosis not present

## 2020-03-24 DIAGNOSIS — Z4801 Encounter for change or removal of surgical wound dressing: Secondary | ICD-10-CM | POA: Diagnosis not present

## 2020-03-24 DIAGNOSIS — Z7901 Long term (current) use of anticoagulants: Secondary | ICD-10-CM | POA: Diagnosis not present

## 2020-03-24 DIAGNOSIS — L97929 Non-pressure chronic ulcer of unspecified part of left lower leg with unspecified severity: Secondary | ICD-10-CM | POA: Diagnosis not present

## 2020-03-27 DIAGNOSIS — L97229 Non-pressure chronic ulcer of left calf with unspecified severity: Secondary | ICD-10-CM | POA: Diagnosis not present

## 2020-03-27 DIAGNOSIS — I872 Venous insufficiency (chronic) (peripheral): Secondary | ICD-10-CM | POA: Diagnosis not present

## 2020-03-27 DIAGNOSIS — L97929 Non-pressure chronic ulcer of unspecified part of left lower leg with unspecified severity: Secondary | ICD-10-CM | POA: Diagnosis not present

## 2020-03-27 DIAGNOSIS — Z7901 Long term (current) use of anticoagulants: Secondary | ICD-10-CM | POA: Diagnosis not present

## 2020-03-27 DIAGNOSIS — Z4801 Encounter for change or removal of surgical wound dressing: Secondary | ICD-10-CM | POA: Diagnosis not present

## 2020-03-27 DIAGNOSIS — T86821 Skin graft (allograft) (autograft) failure: Secondary | ICD-10-CM | POA: Diagnosis not present

## 2020-03-31 DIAGNOSIS — T86821 Skin graft (allograft) (autograft) failure: Secondary | ICD-10-CM | POA: Diagnosis not present

## 2020-03-31 DIAGNOSIS — L97229 Non-pressure chronic ulcer of left calf with unspecified severity: Secondary | ICD-10-CM | POA: Diagnosis not present

## 2020-03-31 DIAGNOSIS — Z4801 Encounter for change or removal of surgical wound dressing: Secondary | ICD-10-CM | POA: Diagnosis not present

## 2020-03-31 DIAGNOSIS — L97929 Non-pressure chronic ulcer of unspecified part of left lower leg with unspecified severity: Secondary | ICD-10-CM | POA: Diagnosis not present

## 2020-03-31 DIAGNOSIS — Z7901 Long term (current) use of anticoagulants: Secondary | ICD-10-CM | POA: Diagnosis not present

## 2020-03-31 DIAGNOSIS — I872 Venous insufficiency (chronic) (peripheral): Secondary | ICD-10-CM | POA: Diagnosis not present

## 2020-04-03 DIAGNOSIS — Z87891 Personal history of nicotine dependence: Secondary | ICD-10-CM | POA: Diagnosis not present

## 2020-04-03 DIAGNOSIS — E1151 Type 2 diabetes mellitus with diabetic peripheral angiopathy without gangrene: Secondary | ICD-10-CM | POA: Diagnosis not present

## 2020-04-03 DIAGNOSIS — L97221 Non-pressure chronic ulcer of left calf limited to breakdown of skin: Secondary | ICD-10-CM | POA: Diagnosis not present

## 2020-04-03 DIAGNOSIS — I83022 Varicose veins of left lower extremity with ulcer of calf: Secondary | ICD-10-CM | POA: Diagnosis not present

## 2020-04-03 DIAGNOSIS — E1122 Type 2 diabetes mellitus with diabetic chronic kidney disease: Secondary | ICD-10-CM | POA: Diagnosis not present

## 2020-04-03 DIAGNOSIS — I129 Hypertensive chronic kidney disease with stage 1 through stage 4 chronic kidney disease, or unspecified chronic kidney disease: Secondary | ICD-10-CM | POA: Diagnosis not present

## 2020-04-03 DIAGNOSIS — I70299 Other atherosclerosis of native arteries of extremities, unspecified extremity: Secondary | ICD-10-CM | POA: Diagnosis not present

## 2020-04-03 DIAGNOSIS — E785 Hyperlipidemia, unspecified: Secondary | ICD-10-CM | POA: Diagnosis not present

## 2020-04-03 DIAGNOSIS — N183 Chronic kidney disease, stage 3 unspecified: Secondary | ICD-10-CM | POA: Diagnosis not present

## 2020-04-03 DIAGNOSIS — Z9049 Acquired absence of other specified parts of digestive tract: Secondary | ICD-10-CM | POA: Diagnosis not present

## 2020-04-03 DIAGNOSIS — I890 Lymphedema, not elsewhere classified: Secondary | ICD-10-CM | POA: Diagnosis not present

## 2020-04-03 DIAGNOSIS — E11622 Type 2 diabetes mellitus with other skin ulcer: Secondary | ICD-10-CM | POA: Diagnosis not present

## 2020-04-03 DIAGNOSIS — I872 Venous insufficiency (chronic) (peripheral): Secondary | ICD-10-CM | POA: Diagnosis not present

## 2020-04-03 DIAGNOSIS — Z79899 Other long term (current) drug therapy: Secondary | ICD-10-CM | POA: Diagnosis not present

## 2020-04-03 DIAGNOSIS — L97229 Non-pressure chronic ulcer of left calf with unspecified severity: Secondary | ICD-10-CM | POA: Diagnosis not present

## 2020-04-07 DIAGNOSIS — Z7901 Long term (current) use of anticoagulants: Secondary | ICD-10-CM | POA: Diagnosis not present

## 2020-04-07 DIAGNOSIS — Z4801 Encounter for change or removal of surgical wound dressing: Secondary | ICD-10-CM | POA: Diagnosis not present

## 2020-04-07 DIAGNOSIS — L97929 Non-pressure chronic ulcer of unspecified part of left lower leg with unspecified severity: Secondary | ICD-10-CM | POA: Diagnosis not present

## 2020-04-07 DIAGNOSIS — L97229 Non-pressure chronic ulcer of left calf with unspecified severity: Secondary | ICD-10-CM | POA: Diagnosis not present

## 2020-04-07 DIAGNOSIS — I872 Venous insufficiency (chronic) (peripheral): Secondary | ICD-10-CM | POA: Diagnosis not present

## 2020-04-07 DIAGNOSIS — T86821 Skin graft (allograft) (autograft) failure: Secondary | ICD-10-CM | POA: Diagnosis not present

## 2020-04-10 DIAGNOSIS — I872 Venous insufficiency (chronic) (peripheral): Secondary | ICD-10-CM | POA: Diagnosis not present

## 2020-04-10 DIAGNOSIS — L97929 Non-pressure chronic ulcer of unspecified part of left lower leg with unspecified severity: Secondary | ICD-10-CM | POA: Diagnosis not present

## 2020-04-10 DIAGNOSIS — Z7901 Long term (current) use of anticoagulants: Secondary | ICD-10-CM | POA: Diagnosis not present

## 2020-04-10 DIAGNOSIS — L97229 Non-pressure chronic ulcer of left calf with unspecified severity: Secondary | ICD-10-CM | POA: Diagnosis not present

## 2020-04-10 DIAGNOSIS — Z4801 Encounter for change or removal of surgical wound dressing: Secondary | ICD-10-CM | POA: Diagnosis not present

## 2020-04-10 DIAGNOSIS — T86821 Skin graft (allograft) (autograft) failure: Secondary | ICD-10-CM | POA: Diagnosis not present

## 2020-04-14 DIAGNOSIS — Z7901 Long term (current) use of anticoagulants: Secondary | ICD-10-CM | POA: Diagnosis not present

## 2020-04-14 DIAGNOSIS — I872 Venous insufficiency (chronic) (peripheral): Secondary | ICD-10-CM | POA: Diagnosis not present

## 2020-04-14 DIAGNOSIS — Z4801 Encounter for change or removal of surgical wound dressing: Secondary | ICD-10-CM | POA: Diagnosis not present

## 2020-04-14 DIAGNOSIS — T86821 Skin graft (allograft) (autograft) failure: Secondary | ICD-10-CM | POA: Diagnosis not present

## 2020-04-14 DIAGNOSIS — L97229 Non-pressure chronic ulcer of left calf with unspecified severity: Secondary | ICD-10-CM | POA: Diagnosis not present

## 2020-04-14 DIAGNOSIS — L97929 Non-pressure chronic ulcer of unspecified part of left lower leg with unspecified severity: Secondary | ICD-10-CM | POA: Diagnosis not present

## 2020-04-15 ENCOUNTER — Emergency Department (HOSPITAL_COMMUNITY): Payer: Medicare Other

## 2020-04-15 ENCOUNTER — Emergency Department (HOSPITAL_COMMUNITY)
Admission: EM | Admit: 2020-04-15 | Discharge: 2020-04-16 | Disposition: A | Discharge status: Home / Self Care | Payer: Medicare Other | Attending: Emergency Medicine | Admitting: Emergency Medicine

## 2020-04-15 ENCOUNTER — Encounter (HOSPITAL_COMMUNITY): Payer: Self-pay | Admitting: Emergency Medicine

## 2020-04-15 DIAGNOSIS — S8012XA Contusion of left lower leg, initial encounter: Secondary | ICD-10-CM | POA: Diagnosis not present

## 2020-04-15 DIAGNOSIS — E785 Hyperlipidemia, unspecified: Secondary | ICD-10-CM | POA: Insufficient documentation

## 2020-04-15 DIAGNOSIS — I4891 Unspecified atrial fibrillation: Secondary | ICD-10-CM | POA: Diagnosis not present

## 2020-04-15 DIAGNOSIS — E1169 Type 2 diabetes mellitus with other specified complication: Secondary | ICD-10-CM | POA: Diagnosis not present

## 2020-04-15 DIAGNOSIS — J189 Pneumonia, unspecified organism: Secondary | ICD-10-CM

## 2020-04-15 DIAGNOSIS — W07XXXA Fall from chair, initial encounter: Secondary | ICD-10-CM | POA: Insufficient documentation

## 2020-04-15 DIAGNOSIS — S0990XA Unspecified injury of head, initial encounter: Secondary | ICD-10-CM | POA: Diagnosis present

## 2020-04-15 DIAGNOSIS — S8992XA Unspecified injury of left lower leg, initial encounter: Secondary | ICD-10-CM | POA: Diagnosis present

## 2020-04-15 DIAGNOSIS — I517 Cardiomegaly: Secondary | ICD-10-CM | POA: Diagnosis not present

## 2020-04-15 DIAGNOSIS — Z87891 Personal history of nicotine dependence: Secondary | ICD-10-CM | POA: Insufficient documentation

## 2020-04-15 DIAGNOSIS — Z96651 Presence of right artificial knee joint: Secondary | ICD-10-CM | POA: Diagnosis not present

## 2020-04-15 DIAGNOSIS — J181 Lobar pneumonia, unspecified organism: Secondary | ICD-10-CM | POA: Diagnosis not present

## 2020-04-15 DIAGNOSIS — J811 Chronic pulmonary edema: Secondary | ICD-10-CM | POA: Diagnosis not present

## 2020-04-15 DIAGNOSIS — Y92019 Unspecified place in single-family (private) house as the place of occurrence of the external cause: Secondary | ICD-10-CM | POA: Insufficient documentation

## 2020-04-15 DIAGNOSIS — E1122 Type 2 diabetes mellitus with diabetic chronic kidney disease: Secondary | ICD-10-CM | POA: Insufficient documentation

## 2020-04-15 DIAGNOSIS — Z043 Encounter for examination and observation following other accident: Secondary | ICD-10-CM | POA: Diagnosis not present

## 2020-04-15 DIAGNOSIS — I129 Hypertensive chronic kidney disease with stage 1 through stage 4 chronic kidney disease, or unspecified chronic kidney disease: Secondary | ICD-10-CM | POA: Diagnosis not present

## 2020-04-15 DIAGNOSIS — N184 Chronic kidney disease, stage 4 (severe): Secondary | ICD-10-CM | POA: Insufficient documentation

## 2020-04-15 DIAGNOSIS — T148XXA Other injury of unspecified body region, initial encounter: Secondary | ICD-10-CM

## 2020-04-15 DIAGNOSIS — Z85828 Personal history of other malignant neoplasm of skin: Secondary | ICD-10-CM | POA: Diagnosis not present

## 2020-04-15 DIAGNOSIS — R059 Cough, unspecified: Secondary | ICD-10-CM

## 2020-04-15 DIAGNOSIS — I10 Essential (primary) hypertension: Secondary | ICD-10-CM | POA: Diagnosis not present

## 2020-04-15 DIAGNOSIS — I480 Paroxysmal atrial fibrillation: Secondary | ICD-10-CM | POA: Diagnosis not present

## 2020-04-15 DIAGNOSIS — W19XXXA Unspecified fall, initial encounter: Secondary | ICD-10-CM

## 2020-04-15 LAB — CBC WITH DIFFERENTIAL/PLATELET
Abs Immature Granulocytes: 0.02 10*3/uL (ref 0.00–0.07)
Basophils Absolute: 0 10*3/uL (ref 0.0–0.1)
Basophils Relative: 0 %
Eosinophils Absolute: 0 10*3/uL (ref 0.0–0.5)
Eosinophils Relative: 0 %
HCT: 42.5 % (ref 39.0–52.0)
Hemoglobin: 13.3 g/dL (ref 13.0–17.0)
Immature Granulocytes: 0 %
Lymphocytes Relative: 5 %
Lymphs Abs: 0.5 10*3/uL — ABNORMAL LOW (ref 0.7–4.0)
MCH: 29.4 pg (ref 26.0–34.0)
MCHC: 31.3 g/dL (ref 30.0–36.0)
MCV: 94 fL (ref 80.0–100.0)
Monocytes Absolute: 0.7 10*3/uL (ref 0.1–1.0)
Monocytes Relative: 7 %
Neutro Abs: 9.4 10*3/uL — ABNORMAL HIGH (ref 1.7–7.7)
Neutrophils Relative %: 88 %
Platelets: 204 10*3/uL (ref 150–400)
RBC: 4.52 MIL/uL (ref 4.22–5.81)
RDW: 15.4 % (ref 11.5–15.5)
WBC: 10.7 10*3/uL — ABNORMAL HIGH (ref 4.0–10.5)
nRBC: 0 % (ref 0.0–0.2)

## 2020-04-15 LAB — COMPREHENSIVE METABOLIC PANEL
ALT: 17 U/L (ref 0–44)
AST: 20 U/L (ref 15–41)
Albumin: 3.4 g/dL — ABNORMAL LOW (ref 3.5–5.0)
Alkaline Phosphatase: 97 U/L (ref 38–126)
Anion gap: 10 (ref 5–15)
BUN: 37 mg/dL — ABNORMAL HIGH (ref 8–23)
CO2: 23 mmol/L (ref 22–32)
Calcium: 9.1 mg/dL (ref 8.9–10.3)
Chloride: 106 mmol/L (ref 98–111)
Creatinine, Ser: 2.06 mg/dL — ABNORMAL HIGH (ref 0.61–1.24)
GFR, Estimated: 30 mL/min — ABNORMAL LOW (ref >60)
Glucose, Bld: 184 mg/dL — ABNORMAL HIGH (ref 70–99)
Potassium: 4 mmol/L (ref 3.5–5.1)
Sodium: 139 mmol/L (ref 135–145)
Total Bilirubin: 0.9 mg/dL (ref 0.3–1.2)
Total Protein: 6.7 g/dL (ref 6.5–8.1)

## 2020-04-15 LAB — URINALYSIS, ROUTINE W REFLEX MICROSCOPIC
Bilirubin Urine: NEGATIVE
Glucose, UA: 150 mg/dL — AB
Ketones, ur: NEGATIVE mg/dL
Leukocytes,Ua: NEGATIVE
Nitrite: NEGATIVE
Protein, ur: >=300 mg/dL — AB
Specific Gravity, Urine: 1.013 (ref 1.005–1.030)
pH: 5 (ref 5.0–8.0)

## 2020-04-15 LAB — CK: Total CK: 179 U/L (ref 49–397)

## 2020-04-15 MED ORDER — DOXYCYCLINE HYCLATE 100 MG PO CAPS: 100.0000 mg | ORAL_CAPSULE | Freq: Two times a day (BID) | ORAL | 0 refills | Status: AC

## 2020-04-15 MED ORDER — SODIUM CHLORIDE 0.9 % IV BOLUS
500.0000 mL | Freq: Once | INTRAVENOUS | Status: AC
  Administered 2020-04-15: 500 mL via INTRAVENOUS

## 2020-04-15 MED ORDER — DOXYCYCLINE HYCLATE 100 MG PO TABS
100.0000 mg | ORAL_TABLET | Freq: Two times a day (BID) | ORAL | Status: DC
  Administered 2020-04-15 – 2020-04-16 (×2): 100 mg via ORAL
  Filled 2020-04-15 (×2): qty 1

## 2020-04-15 MED ORDER — ACETAMINOPHEN 325 MG PO TABS: 650.0000 mg | ORAL_TABLET | Freq: Four times a day (QID) | ORAL | Status: DC | PRN

## 2020-04-15 MED ORDER — APIXABAN 2.5 MG PO TABS
2.5000 mg | ORAL_TABLET | Freq: Two times a day (BID) | ORAL | Status: DC
  Administered 2020-04-16: 2.5 mg via ORAL
  Filled 2020-04-15: qty 1

## 2020-04-15 NOTE — ED Notes (Signed)
Pt not able to stand or sit on side of bed.  PA and family aware.  Family wants to seek placement for rehab.

## 2020-04-15 NOTE — ED Notes (Signed)
Meagan (SW) in to speak to pt.

## 2020-04-15 NOTE — ED Notes (Signed)
Pt was unable to sit at bedside and unable to walk.
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
## 2020-04-15 NOTE — Clinical Social Work Note (Signed)
Transition of Care [facility]) - Emergency Department Mini Assessment  Patient Details  Name: Don Hall. MRN: [id number] Date of Birth: 1927-09-14  Transition of Care [facility]) CM/SW Contact:    Sherie Don, LCSW Phone Number: 04/15/2020, 3:14 PM  Clinical Narrative: Patient is a 87 year old male who presented to the ED for a fall. TOC received consult for HH/DME.  CSW spoke with patient's daughter, Izola Price. Per daughter, patient resides at home alone, but family checks on the patient 3x's/day at meal times and daughter stays with him on Saturdays and Sundays. Daughter reported this is patient's first fall in a long time. Patient has a wheelchair, walker, and lift chair at home. He also has a PERS (Personal Emergency Response System), but he did not press the button when he fell. Patient requires some assistance with ADLs at baseline.  CSW met with patient and son in ED to discuss [facility]. Patient agreeable, but wanted CSW to discuss an agency with his daughter. Daughter requested [facility]. CSW made referral to [facility] with [facility]. Orders have been placed. TOC signing off.  ED Mini Assessment: What brought you to the Emergency Department? : Fall at home Barriers to Discharge: ED Barriers Resolved Barrier interventions: HH referral Means of departure: Car Interventions which prevented an admission or readmission: [place] or Services  Patient Contact and Communications Key Contact 1: [facility] with: Claudette Head Date: 04/15/20     Call outcome: Referral accepted Patient states their goals for this hospitalization and ongoing recovery are:: Return home CMS Medicare.gov Compare Post Acute Care list provided to:: Patient Choice offered to / list presented to : [place]  Admission diagnosis:  Fall Patient Active Problem List   Diagnosis Date Noted   Acute respiratory disease due to COVID-19 virus 02/20/2020   CKD (chronic kidney disease), stage  IV ([place]) 02/20/2020   Syncope 06/02/2017   Pancreatitis, acute 05/21/2017   Hypokalemia 05/21/2017   Nausea & vomiting 05/21/2017   Acute kidney injury superimposed on CKD ([place]) 05/21/2017   Lactic acidosis 05/21/2017   Malnutrition of moderate degree 04/26/2017   Anemia of chronic disease 04/24/2017   H/o DVT (deep venous thrombosis) ([place]) 04/24/2017   Sepsis ([place]) 04/23/2017   Myoclonus 04/23/2017   Generalized weakness 04/23/2017   Pre-syncope 04/23/2017   Heel ulcer ([place]) 04/23/2017   Orthostatic hypotension 04/23/2017   Varicose veins of left lower extremity with ulcer of calf ([place]) 01/27/2015   Atherosclerosis of artery of extremity with ulceration ([place]) 10/24/2014   Diarrhea 06/16/2014   Shortness of breath 10/09/2013   GERD (gastroesophageal reflux disease) 04/25/2011   Long term (current) use of anticoagulants 06/07/2010   DEEP VENOUS THROMBOPHLEBITIS, RECURRENT 03/08/2010   Hyperlipemia 12/15/2008   Essential hypertension, benign 12/15/2008   Paroxysmal atrial fibrillation ([place]) 12/15/2008   PCP:  Caryl Bis, MD Pharmacy:   [facility] [place] [address] [place] [address] Phone: [phone] Fax: [phone]  [place] [address], [place] [address] [place] [address] Phone: [phone] Fax: [phone]

## 2020-04-15 NOTE — Discharge Instructions (Addendum)
  Take Doxycycline as prescribed and complete the full course.  Follow-up with the doctor at the facility for further care and treatment as needed.

## 2020-04-15 NOTE — ED Triage Notes (Signed)
Pt brought in by [facility] for home.  Pt found by family member on the floor this am.  Pt reports sliding onto the floor about 2 am, denies hitting head.  Pt is on blood thinner.  Large purple bruising noted to buttocks and both posterior upper  thighs, greater on the left posterior upper thigh. Pt smelt of foul urine, skin very dry and scaly.  Complete bath done.  Dressing noted to left lower leg and foot.  Non-slid socks applied.  Denies any pain at this time.  Pt is congested.

## 2020-04-15 NOTE — ED Notes (Signed)
Patient transported to CT and XR 

## 2020-04-15 NOTE — ED Notes (Signed)
Pt given peanut butter crackers, water and ginger-ale.

## 2020-04-16 DIAGNOSIS — I4891 Unspecified atrial fibrillation: Secondary | ICD-10-CM | POA: Diagnosis not present

## 2020-04-16 DIAGNOSIS — M1990 Unspecified osteoarthritis, unspecified site: Secondary | ICD-10-CM | POA: Diagnosis not present

## 2020-04-16 DIAGNOSIS — L97229 Non-pressure chronic ulcer of left calf with unspecified severity: Secondary | ICD-10-CM | POA: Diagnosis not present

## 2020-04-16 DIAGNOSIS — I10 Essential (primary) hypertension: Secondary | ICD-10-CM | POA: Diagnosis not present

## 2020-04-16 DIAGNOSIS — T148XXD Other injury of unspecified body region, subsequent encounter: Secondary | ICD-10-CM | POA: Diagnosis not present

## 2020-04-16 DIAGNOSIS — S8012XA Contusion of left lower leg, initial encounter: Secondary | ICD-10-CM | POA: Diagnosis not present

## 2020-04-16 DIAGNOSIS — I2510 Atherosclerotic heart disease of native coronary artery without angina pectoris: Secondary | ICD-10-CM | POA: Diagnosis not present

## 2020-04-16 DIAGNOSIS — N179 Acute kidney failure, unspecified: Secondary | ICD-10-CM | POA: Diagnosis not present

## 2020-04-16 DIAGNOSIS — N183 Chronic kidney disease, stage 3 unspecified: Secondary | ICD-10-CM | POA: Diagnosis not present

## 2020-04-16 DIAGNOSIS — J189 Pneumonia, unspecified organism: Secondary | ICD-10-CM | POA: Diagnosis not present

## 2020-04-16 DIAGNOSIS — T07XXXA Unspecified multiple injuries, initial encounter: Secondary | ICD-10-CM | POA: Diagnosis not present

## 2020-04-16 DIAGNOSIS — M6281 Muscle weakness (generalized): Secondary | ICD-10-CM | POA: Diagnosis not present

## 2020-04-16 DIAGNOSIS — I83022 Varicose veins of left lower extremity with ulcer of calf: Secondary | ICD-10-CM | POA: Diagnosis not present

## 2020-04-16 DIAGNOSIS — I5021 Acute systolic (congestive) heart failure: Secondary | ICD-10-CM | POA: Diagnosis not present

## 2020-04-16 DIAGNOSIS — Z23 Encounter for immunization: Secondary | ICD-10-CM | POA: Diagnosis not present

## 2020-04-16 DIAGNOSIS — I70299 Other atherosclerosis of native arteries of extremities, unspecified extremity: Secondary | ICD-10-CM | POA: Diagnosis not present

## 2020-04-16 DIAGNOSIS — Z7401 Bed confinement status: Secondary | ICD-10-CM | POA: Diagnosis not present

## 2020-04-16 DIAGNOSIS — R41841 Cognitive communication deficit: Secondary | ICD-10-CM | POA: Diagnosis not present

## 2020-04-16 DIAGNOSIS — Z87891 Personal history of nicotine dependence: Secondary | ICD-10-CM | POA: Diagnosis not present

## 2020-04-16 DIAGNOSIS — E1151 Type 2 diabetes mellitus with diabetic peripheral angiopathy without gangrene: Secondary | ICD-10-CM | POA: Diagnosis not present

## 2020-04-16 DIAGNOSIS — R2689 Other abnormalities of gait and mobility: Secondary | ICD-10-CM | POA: Diagnosis not present

## 2020-04-16 DIAGNOSIS — R0902 Hypoxemia: Secondary | ICD-10-CM | POA: Diagnosis not present

## 2020-04-16 DIAGNOSIS — Z9181 History of falling: Secondary | ICD-10-CM | POA: Diagnosis not present

## 2020-04-16 DIAGNOSIS — Z8673 Personal history of transient ischemic attack (TIA), and cerebral infarction without residual deficits: Secondary | ICD-10-CM | POA: Diagnosis not present

## 2020-04-16 DIAGNOSIS — E785 Hyperlipidemia, unspecified: Secondary | ICD-10-CM | POA: Diagnosis not present

## 2020-04-16 DIAGNOSIS — Z79899 Other long term (current) drug therapy: Secondary | ICD-10-CM | POA: Diagnosis not present

## 2020-04-16 DIAGNOSIS — Z85828 Personal history of other malignant neoplasm of skin: Secondary | ICD-10-CM | POA: Diagnosis not present

## 2020-04-16 DIAGNOSIS — I129 Hypertensive chronic kidney disease with stage 1 through stage 4 chronic kidney disease, or unspecified chronic kidney disease: Secondary | ICD-10-CM | POA: Diagnosis not present

## 2020-04-16 DIAGNOSIS — I890 Lymphedema, not elsewhere classified: Secondary | ICD-10-CM | POA: Diagnosis not present

## 2020-04-16 DIAGNOSIS — E1169 Type 2 diabetes mellitus with other specified complication: Secondary | ICD-10-CM | POA: Diagnosis not present

## 2020-04-16 DIAGNOSIS — L97909 Non-pressure chronic ulcer of unspecified part of unspecified lower leg with unspecified severity: Secondary | ICD-10-CM | POA: Diagnosis not present

## 2020-04-16 DIAGNOSIS — X58XXXD Exposure to other specified factors, subsequent encounter: Secondary | ICD-10-CM | POA: Diagnosis not present

## 2020-04-16 DIAGNOSIS — Z7901 Long term (current) use of anticoagulants: Secondary | ICD-10-CM | POA: Diagnosis not present

## 2020-04-16 DIAGNOSIS — E1122 Type 2 diabetes mellitus with diabetic chronic kidney disease: Secondary | ICD-10-CM | POA: Diagnosis not present

## 2020-04-16 DIAGNOSIS — L97221 Non-pressure chronic ulcer of left calf limited to breakdown of skin: Secondary | ICD-10-CM | POA: Diagnosis not present

## 2020-04-16 DIAGNOSIS — R29898 Other symptoms and signs involving the musculoskeletal system: Secondary | ICD-10-CM | POA: Diagnosis not present

## 2020-04-16 DIAGNOSIS — N184 Chronic kidney disease, stage 4 (severe): Secondary | ICD-10-CM | POA: Diagnosis not present

## 2020-04-16 MED ORDER — DOXYCYCLINE HYCLATE 100 MG PO CAPS: 100.0000 mg | ORAL_CAPSULE | Freq: Two times a day (BID) | ORAL | 0 refills | Status: AC

## 2020-04-16 NOTE — ED Notes (Signed)
Report called to Don Hall pt to 145 at [facility].

## 2020-04-16 NOTE — ED Notes (Signed)
Patient had slide down in the middle of the bed and was playing with brief. Patient had incontinence of bowel and had partial pulled off the male pure wick. Patient cleaned and changed at this time. Two person assist.

## 2020-04-16 NOTE — ED Notes (Signed)
pts room picked up breakfast tray cleaned up and pt repositioned in bed.

## 2020-04-16 NOTE — Plan of Care (Signed)
  Problem: Acute Rehab PT Goals(only PT should resolve) Goal: Pt Will Go Supine/Side To Sit Outcome: Progressing Flowsheets (Taken 04/16/2020 1010) Pt will go Supine/Side to Sit:  with minimal assist  with moderate assist Goal: Patient Will Transfer Sit To/From Stand Outcome: Progressing Flowsheets (Taken 04/16/2020 1010) Patient will transfer sit to/from stand: with minimal assist Goal: Pt Will Transfer Bed To Chair/Chair To Bed Outcome: Progressing Flowsheets (Taken 04/16/2020 1010) Pt will Transfer Bed to Chair/Chair to Bed:  with min assist  with mod assist Goal: Pt Will Ambulate Outcome: Progressing Flowsheets (Taken 04/16/2020 1010) Pt will Ambulate:  25 feet  with minimal assist  with moderate assist  with rolling walker   10:11 AM, 04/16/20 Lonell Grandchild, MPT Physical Therapist with [facility] 336 [phone] office [phone] mobile phone

## 2020-04-16 NOTE — NC FL2 (Signed)
[place] LEVEL OF CARE SCREENING TOOL     IDENTIFICATION  Patient Name: Don Hall. Birthdate: May 04, 1927 Sex: male Admission Date (Current Location): 04/15/2020  [place] and [place] Number:  [employer] and Address:  [facility] [address] [place]      Provider Number: [id number]  Attending Physician Name and Address:  Default, Provider, MD  Relative Name and Phone Number:  Izola Price (daughter) Ph: [phone]    Current Level of Care: Hospital Recommended Level of Care: [place] Prior Approval Number:    Date Approved/Denied:   PASRR Number: [id number]  Discharge Plan: SNF    Current Diagnoses: Patient Active Problem List   Diagnosis Date Noted  . Acute respiratory disease due to COVID-19 virus 02/20/2020  . CKD (chronic kidney disease), stage IV ([place]) 02/20/2020  . Syncope 06/02/2017  . Pancreatitis, acute 05/21/2017  . Hypokalemia 05/21/2017  . Nausea & vomiting 05/21/2017  . Acute kidney injury superimposed on CKD ([place]) 05/21/2017  . Lactic acidosis 05/21/2017  . Malnutrition of moderate degree 04/26/2017  . Anemia of chronic disease 04/24/2017  . H/o DVT (deep venous thrombosis) ([place]) 04/24/2017  . Sepsis ([place]) 04/23/2017  . Myoclonus 04/23/2017  . Generalized weakness 04/23/2017  . Pre-syncope 04/23/2017  . Heel ulcer ([place]) 04/23/2017  . Orthostatic hypotension 04/23/2017  . Varicose veins of left lower extremity with ulcer of calf ([place]) 01/27/2015  . Atherosclerosis of artery of extremity with ulceration ([place]) 10/24/2014  . Diarrhea 06/16/2014  . Shortness of breath 10/09/2013  . GERD (gastroesophageal reflux disease) 04/25/2011  . Long term (current) use of anticoagulants 06/07/2010  . DEEP VENOUS THROMBOPHLEBITIS, RECURRENT 03/08/2010  . Hyperlipemia 12/15/2008  . Essential hypertension, benign 12/15/2008  . Paroxysmal atrial fibrillation ([place]) 12/15/2008     Orientation RESPIRATION BLADDER Height & Weight     Self,Time,Situation,Place  Normal Incontinent Weight: 167 lb 1.7 oz (75.8 kg) Height:  5\' 10"  (177.8 cm)  BEHAVIORAL SYMPTOMS/MOOD NEUROLOGICAL BOWEL NUTRITION STATUS      Incontinent Diet ([place] diet)  AMBULATORY STATUS COMMUNICATION OF NEEDS Skin   Extensive Assist Verbally Other (Comment) (Patient long-term sore on leg.)                       Personal Care Assistance Level of Assistance  Bathing,Feeding,Dressing Bathing Assistance: Limited assistance Feeding assistance: Independent Dressing Assistance: Limited assistance     Functional Limitations Info  Sight,Hearing,Speech Sight Info: Impaired Hearing Info: Impaired Speech Info: Adequate    SPECIAL CARE FACTORS FREQUENCY  PT (By licensed PT)     PT Frequency: 5x's/week              Contractures Contractures Info: Not present    Additional Factors Info  Allergies   Allergies Info: Simvastatin           Current Medications (04/16/2020):  This is the current hospital active medication list Current Facility-Administered Medications  Medication Dose Route Frequency Provider Last Rate Last Admin  . acetaminophen (TYLENOL) tablet 650 mg  650 mg Oral Q6H PRN Tacy Learn, PA-C      . apixaban Arne Cleveland) tablet 2.5 mg  2.5 mg Oral BID Suella Broad A, PA-C   2.5 mg at 04/16/20 1006  . doxycycline (VIBRA-TABS) tablet 100 mg  100 mg Oral Q12H Suella Broad A, PA-C   100 mg at 04/16/20 1006   Current Outpatient Medications  Medication Sig Dispense Refill  . acetaminophen (  TYLENOL) 325 MG tablet Take 650 mg by mouth every 6 (six) hours as needed for mild pain.    Marland Kitchen apixaban (ELIQUIS) 2.5 MG TABS tablet Take 1 tablet (2.5 mg total) by mouth 2 (two) times daily. (Patient taking differently: Take 1.25 mg by mouth daily.) 60 tablet 0  . doxycycline (VIBRAMYCIN) 100 MG capsule Take 1 capsule (100 mg total) by mouth 2 (two) times daily. 20 capsule 0  .  collagenase (SANTYL) ointment Apply topically daily. Apply Santyl to right heel wound Q day, then cover with moist gauze 2X2 and foam dressing.  (Change foam dressing Q 3 days or PRN soiling.) (Patient not taking: No sig reported) 15 g 0  . fludrocortisone (FLORINEF) 0.1 MG tablet Take 1 tablet (0.1 mg total) by mouth daily. (Patient not taking: Reported on 04/15/2020) 30 tablet 2  . guaiFENesin-dextromethorphan (ROBITUSSIN DM) 100-10 MG/5ML syrup Take 10 mLs by mouth every 4 (four) hours as needed for cough. (Patient not taking: Reported on 04/15/2020) 118 mL 0  . Ipratropium-Albuterol (COMBIVENT) 20-100 MCG/ACT AERS respimat Inhale 1 puff into the lungs every 6 (six) hours as needed for wheezing or shortness of breath. (Patient not taking: Reported on 04/15/2020) 4 g 0  . lactose free nutrition (BOOST) LIQD Take 237 mLs by mouth 2 (two) times daily between meals. (Patient not taking: No sig reported) 60 Can 0  . neomycin-bacitracin-polymyxin (NEOSPORIN) OINT Apply 1 application topically daily. Apply Neosporin to edges of left leg wound Q day, then cover with nonadherent dressing and kerlex (Patient not taking: No sig reported) 3.5 g 0     Discharge Medications: Please see discharge summary for a list of discharge medications.  Relevant Imaging Results:  Relevant Lab Results:   Additional Information SSN: [id number]  Sherie Don, LCSW

## 2020-04-16 NOTE — ED Provider Notes (Signed)
2:35 PM-I was contacted by social work who states that the patient can be placed in a facility today, assuming an AVS can be printed and a note placed in his chart regarding his course in the ED.  Patient arrived yesterday, at 10:06 AM.  He had a fall, when he slipped out of his chair onto the floor and could not get up.  He apparently was on the floor overnight.  He has been debilitated from a recent Covid infection, 2 months ago.  The patient arrived yesterday alert and cooperative and not in any distress.  He had some bruising of the superior buttocks region/lower back.  As part of the work-up he had imaging of the head, with CT, and the chest, and pelvis, with plain radiography.  Head CT did not show anything acute, such as injury.  Chest x-ray was consistent with recent Covid infection causing ongoing abnormality which is visualized.  Pelvis x-ray was normal.  Patient did not have respiratory distress, however with mildly elevated white count and neutrophils, the provider elected to start treatment with doxycycline for possible bacterial pneumonia.  He did not require radiography of the lower back or pelvis.  Patient had screening labs, to assess his overall status.  CK was normal.  This indicates that he does not have rhabdomyolysis.  CBC was normal except for mild elevation of white count at 10.7, with elevated neutrophil count.  C-Met was normal except the glucose is high at 184, BUN high at 37, creatinine high at 2.06, albumin low 3.4 and GFR low 30.  These findings indicate stability as compared with similar labs obtained 1 month ago.  The patient's usual medications were initiated.  At this time I saw the patient for the first visit by me.  He was alert and comfortable.  He states he did not have any pain unless he was moving then he had pain in his buttock.  His daughter is with him and helps to give history.  The patient is not in any distress.  Vital signs taken at 1400 hrs., normal except slight  elevation of blood pressure at 146/76.  The patient is currently stable for transfer, by EMS, to a nursing care facility.   Daleen Bo, MD 04/16/20 [phone]

## 2020-04-16 NOTE — TOC Progression Note (Signed)
Transition of Care (TOC) - Progression Note    Patient Details  Name: Don Hall. MRN: [id number] Date of Birth: 07-31-1927  Transition [facility]) CM/SW Contact  Roda Shutters Margretta Sidle, RN Phone Number: 04/16/2020, 2:36 PM  The Appropriate Use Committee has met to discuss this patient's plan of care to review recommendations for post-acute treatment options.  The options were reviewed with the attending MD, TOC, rehab services and the physician advisor.  All available documentation has been reviewed and the determination has been made that this patient does meet criteria for placement in a [place] for short-term rehab. A consult to the Transitions of Care Team has been made to discuss alternative options and discharge planning with the patient/family.

## 2020-04-16 NOTE — TOC Initial Note (Signed)
Transition of Care (TOC) - Initial/Assessment Note   Patient Details  Name: Don Hall. MRN: [id number] Date of Birth: 03/15/1927  Transition of Care [facility]) CM/SW Contact:    Sherie Don, LCSW Phone Number: 04/16/2020, 11:58 AM  Clinical Narrative: Patient refused to discharge from ED yesterday as he is now requesting SNF. PT evaluation recommended SNF. CSW received approval from the Appropriate Use Committee to fax patient out for SNF. Daughter requested [facility] for first choice. FL2 completed; PASRR verified. Initial referral faxed out. TOC awaiting bed offers.  Expected Discharge Plan: Skilled Nursing Facility Barriers to Discharge: SNF Pending bed offer  Patient Goals and CMS Choice Patient states their goals for this hospitalization and ongoing recovery are:: Go to SNF CMS Medicare.gov Compare Post Acute Care list provided to:: Patient Choice offered to / list presented to : [place]  Expected Discharge Plan and Services Expected Discharge Plan: [place] In-house Referral: Clinical Social Work Discharge Planning Services: NA Post Acute Care Choice: [place] Living arrangements for the past 2 months: Single Family Home           DME Arranged: N/A DME Agency: NA HH Arranged: NA HH Agency: NA  Prior Living Arrangements/Services Living arrangements for the past 2 months: [place] with:: Self Patient language and need for interpreter reviewed:: Yes Do you feel safe going back to the place where you live?: Yes      Need for Family Participation in Patient Care: Yes (Comment) Care giver support system in place?: Yes (comment) Current home services: DME [place] Rile, wheelchair, lift chair) Criminal Activity/Legal Involvement Pertinent to Current Situation/Hospitalization: No - Comment as needed  Permission Sought/Granted Permission sought to share information with : Facility Event organiser granted to share information with : Yes, Verbal Permission Granted Permission granted to share info w AGENCY: SNFs  Emotional Assessment Appearance:: Disheveled,Appears stated age Attitude/Demeanor/Rapport: Engaged Affect (typically observed): Accepting Orientation: : Oriented to Self,Oriented to  Time,Oriented to Place,Oriented to Situation Alcohol / Substance Use: Not Applicable Psych Involvement: No (comment)  Admission diagnosis:  Fall Patient Active Problem List   Diagnosis Date Noted  . Acute respiratory disease due to COVID-19 virus 02/20/2020  . CKD (chronic kidney disease), stage IV ([place]) 02/20/2020  . Syncope 06/02/2017  . Pancreatitis, acute 05/21/2017  . Hypokalemia 05/21/2017  . Nausea & vomiting 05/21/2017  . Acute kidney injury superimposed on CKD ([place]) 05/21/2017  . Lactic acidosis 05/21/2017  . Malnutrition of moderate degree 04/26/2017  . Anemia of chronic disease 04/24/2017  . H/o DVT (deep venous thrombosis) ([place]) 04/24/2017  . Sepsis ([place]) 04/23/2017  . Myoclonus 04/23/2017  . Generalized weakness 04/23/2017  . Pre-syncope 04/23/2017  . Heel ulcer ([place]) 04/23/2017  . Orthostatic hypotension 04/23/2017  . Varicose veins of left lower extremity with ulcer of calf ([place]) 01/27/2015  . Atherosclerosis of artery of extremity with ulceration ([place]) 10/24/2014  . Diarrhea 06/16/2014  . Shortness of breath 10/09/2013  . GERD (gastroesophageal reflux disease) 04/25/2011  . Long term (current) use of anticoagulants 06/07/2010  . DEEP VENOUS THROMBOPHLEBITIS, RECURRENT 03/08/2010  . Hyperlipemia 12/15/2008  . Essential hypertension, benign 12/15/2008  . Paroxysmal atrial fibrillation ([place]) 12/15/2008   PCP:  Caryl Bis, MD Pharmacy:   [facility] [place] [address] [place] [address] Phone: [phone] Fax: [phone]  [facility] [address] [place]  [place] [address] Phone: [phone] Fax: [phone]  Readmission Risk Interventions No flowsheet data found.

## 2020-04-16 NOTE — TOC Transition Note (Signed)
Transition of Care [facility]) - CM/SW Discharge Note  Patient Details  Name: Jye Fariss. MRN: [id number] Date of Birth: 21-Feb-1928  Transition of Care [facility]) CM/SW Contact:  Sherie Don, LCSW Phone Number: 04/16/2020, 3:06 PM  Clinical Narrative: Threasa Beards with [facility] agreeable to accepting the patient for SNF. 3 midnight stay is still waived. EDP progress note and AVS faxed to [facility] ([phone]). CSW confirmed patient will not not need a new COVID test as he has been vaccinated and boosted for COVID and patient was COVID+ December 23-24, 2021. Patient will go to room 145 and the number to call for report is [phone], RN is Ronnie Derby. Daughter aware he has been accepted. TOC signing off.  Final next level of care: Skilled Nursing Facility Barriers to Discharge: Barriers Resolved  Patient Goals and CMS Choice Patient states their goals for this hospitalization and ongoing recovery are:: Go to [facility] CMS Medicare.gov Compare Post Acute Care list provided to:: Patient Choice offered to / list presented to : [place]  Discharge Placement Existing PASRR number confirmed : 04/16/20          Patient [facility] bed at: [facility] Patient to be transferred to facility by: [place] Name of family member notified: Izola Price (daughter) Patient and family notified of of transfer: 04/16/20  Discharge Plan and Services In-house Referral: Clinical Social Work Discharge Planning Services: NA Post Acute Care Choice: [place]          DME Arranged: N/A DME Agency: NA HH Arranged: NA [place] Agency: NA  Readmission Risk Interventions No flowsheet data found.

## 2020-04-16 NOTE — Evaluation (Signed)
Physical Therapy Evaluation Patient Details Name: Don Hall. MRN: [id number] DOB: [birth year] Today's Date: 04/16/2020   History of Present Illness  87 year old male brought in by EMS from home, on Eliquis, history of CKD, paroxysmal a fib, DM. Patient reports he slid out of a chair last night and was unable to get back up. Patient's granddaughter went by the house to check on the patient this morning and heard him calling for help, EMS was called to open the door and get to the patient. Patient lives alone, had a home health worker but this person no longer works in the home. Patient reports pain in his bottom, denies hitting his head or LOC.   Patient was admitted to the hospital with COVID at the end of December. States he is having some difficulty breathing today, has a cough during exam.    Clinical Impression  Patient demonstrates slow labored movement for sitting up at bedside with c/o severe pain with pressure to left lower leg due to skin grafts, slightly impulsive requiring frequent verbal cueing for safety, limited to a few unsteady labored side steps at bedside with trunk flexed, had to sit due to fatigue and poor standing balance.  Patient put back to bed after therapy with Mod assist to reposition.  Patient will benefit from continued physical therapy in hospital and recommended venue below to increase strength, balance, endurance for safe ADLs and gait.     Follow Up Recommendations SNF;Supervision for mobility/OOB;Supervision/Assistance - 24 hour    Equipment Recommendations  None recommended by PT    Recommendations for Other Services       Precautions / Restrictions Precautions Precautions: Fall Restrictions Weight Bearing Restrictions: No      Mobility  Bed Mobility Overal bed mobility: Needs Assistance Bed Mobility: Supine to Sit;Sit to Supine     Supine to sit: Mod assist;Max assist Sit to supine: Mod assist   General bed mobility comments: slow  labored movement, increased time    Transfers Overall transfer level: Needs assistance Equipment used: Rolling walker (2 wheeled) Transfers: Sit to/from [facility] Sit to Stand: Min assist;Mod assist Stand pivot transfers: Mod assist       General transfer comment: unsteady on feet, has to lean over RW due to weakness  Ambulation/Gait Ambulation/Gait assistance: Mod assist Gait Distance (Feet): 5 Feet Assistive device: Rolling walker (2 wheeled) Gait Pattern/deviations: Decreased step length - right;Decreased step length - left;Decreased stride length;Trunk flexed;Wide base of support Gait velocity: decreased   General Gait Details: limited to a few side steps before having to sit due to fatigue/poor standing balance  Stairs            Wheelchair Mobility    Modified Rankin (Stroke Patients Only)       Balance Overall balance assessment: Needs assistance Sitting-balance support: Feet supported;No upper extremity supported Sitting balance-Leahy Scale: Fair Sitting balance - Comments: fair/good seated at EOB   Standing balance support: During functional activity;Bilateral upper extremity supported Standing balance-Leahy Scale: Poor Standing balance comment: fair/poor using RW                             Pertinent Vitals/Pain Pain Assessment: Faces Faces Pain Scale: Hurts even more Pain Location: LLE with pressure due to skin grafts Pain Descriptors / Indicators: Sore;Sharp;Grimacing;Guarding Pain Intervention(s): Limited activity within patient's tolerance;Monitored during session;Repositioned    Home Living Family/patient expects to be discharged to:: Private residence  Living Arrangements: Alone Available Help at Discharge: Family;Available PRN/intermittently Type of Home: House Home Access: Ramped entrance     Home Layout: Two level Home Equipment: Walker - 2 wheels;Wheelchair - manual;Grab bars - tub/shower;Shower  seat;Bedside commode      Prior Function Level of Independence: Independent with assistive device(s)         Comments: household ambulator using RW     Hand Dominance        Extremity/Trunk Assessment   Upper Extremity Assessment Upper Extremity Assessment: Generalized weakness    Lower Extremity Assessment Lower Extremity Assessment: Generalized weakness    Cervical / Trunk Assessment Cervical / Trunk Assessment: Kyphotic  Communication   Communication: No difficulties  Cognition Arousal/Alertness: Awake/alert Behavior During Therapy: WFL for tasks assessed/performed Overall Cognitive Status: Within Functional Limits for tasks assessed                                        General Comments      Exercises     Assessment/Plan    PT Assessment Patient needs continued PT services  PT Problem List Decreased strength;Decreased activity tolerance;Decreased balance;Decreased mobility       PT Treatment Interventions DME instruction;Gait training;Stair training;Functional mobility training;Therapeutic activities;Therapeutic exercise;Balance training;Patient/family education    PT Goals (Current goals can be found in the Care Plan section)  Acute Rehab PT Goals Patient Stated Goal: return home after rehab PT Goal Formulation: With patient Time For Goal Achievement: 04/30/20 Potential to Achieve Goals: Good    Frequency Min 2X/week   Barriers to discharge        Co-evaluation               AM-PAC PT "6 Clicks" Mobility  Outcome Measure Help needed turning from your back to your side while in a flat bed without using bedrails?: A Lot Help needed moving from lying on your back to sitting on the side of a flat bed without using bedrails?: A Lot Help needed moving to and from a bed to a chair (including a wheelchair)?: A Lot Help needed standing up from a chair using your arms (e.g., wheelchair or bedside chair)?: A Lot Help needed to  walk in hospital room?: A Lot Help needed climbing 3-5 steps with a railing? : Total 6 Click Score: 11    End of Session   Activity Tolerance: Patient tolerated treatment well;Patient limited by fatigue;Patient limited by pain Patient left: in bed;with call bell/phone within reach Nurse Communication: Mobility status PT Visit Diagnosis: Unsteadiness on feet (R26.81);History of falling (Z91.81);Muscle weakness (generalized) (M62.81);Other abnormalities of gait and mobility (R26.89)    Time: 2836-6294 PT Time Calculation (min) (ACUTE ONLY): 27 min   Charges:   PT Evaluation $PT Eval Moderate Complexity: 1 Mod PT Treatments $Therapeutic Activity: 23-37 mins        10:08 AM, 04/16/20 Lonell Grandchild, MPT Physical Therapist with [facility] [phone] office [phone] mobile phone w

## 2020-04-17 DIAGNOSIS — T148XXD Other injury of unspecified body region, subsequent encounter: Secondary | ICD-10-CM | POA: Diagnosis not present

## 2020-04-17 DIAGNOSIS — J189 Pneumonia, unspecified organism: Secondary | ICD-10-CM | POA: Diagnosis not present

## 2020-04-17 DIAGNOSIS — I4891 Unspecified atrial fibrillation: Secondary | ICD-10-CM | POA: Diagnosis not present

## 2020-04-24 DIAGNOSIS — I890 Lymphedema, not elsewhere classified: Secondary | ICD-10-CM | POA: Diagnosis not present

## 2020-04-24 DIAGNOSIS — N183 Chronic kidney disease, stage 3 unspecified: Secondary | ICD-10-CM | POA: Diagnosis not present

## 2020-04-24 DIAGNOSIS — E1151 Type 2 diabetes mellitus with diabetic peripheral angiopathy without gangrene: Secondary | ICD-10-CM | POA: Diagnosis not present

## 2020-04-24 DIAGNOSIS — T07XXXA Unspecified multiple injuries, initial encounter: Secondary | ICD-10-CM | POA: Diagnosis not present

## 2020-04-24 DIAGNOSIS — I83022 Varicose veins of left lower extremity with ulcer of calf: Secondary | ICD-10-CM | POA: Diagnosis not present

## 2020-04-24 DIAGNOSIS — L97229 Non-pressure chronic ulcer of left calf with unspecified severity: Secondary | ICD-10-CM | POA: Diagnosis not present

## 2020-04-24 DIAGNOSIS — E1122 Type 2 diabetes mellitus with diabetic chronic kidney disease: Secondary | ICD-10-CM | POA: Diagnosis not present

## 2020-04-25 DIAGNOSIS — T148XXD Other injury of unspecified body region, subsequent encounter: Secondary | ICD-10-CM | POA: Diagnosis not present

## 2020-04-25 DIAGNOSIS — J189 Pneumonia, unspecified organism: Secondary | ICD-10-CM | POA: Diagnosis not present

## 2020-04-25 DIAGNOSIS — I4891 Unspecified atrial fibrillation: Secondary | ICD-10-CM | POA: Diagnosis not present

## 2020-05-08 DIAGNOSIS — E1122 Type 2 diabetes mellitus with diabetic chronic kidney disease: Secondary | ICD-10-CM | POA: Diagnosis not present

## 2020-05-08 DIAGNOSIS — I83022 Varicose veins of left lower extremity with ulcer of calf: Secondary | ICD-10-CM | POA: Diagnosis not present

## 2020-05-08 DIAGNOSIS — L97229 Non-pressure chronic ulcer of left calf with unspecified severity: Secondary | ICD-10-CM | POA: Diagnosis not present

## 2020-05-08 DIAGNOSIS — T07XXXA Unspecified multiple injuries, initial encounter: Secondary | ICD-10-CM | POA: Diagnosis not present

## 2020-05-08 DIAGNOSIS — L97221 Non-pressure chronic ulcer of left calf limited to breakdown of skin: Secondary | ICD-10-CM | POA: Diagnosis not present

## 2020-05-08 DIAGNOSIS — N183 Chronic kidney disease, stage 3 unspecified: Secondary | ICD-10-CM | POA: Diagnosis not present

## 2020-05-08 DIAGNOSIS — E1151 Type 2 diabetes mellitus with diabetic peripheral angiopathy without gangrene: Secondary | ICD-10-CM | POA: Diagnosis not present

## 2020-05-08 DIAGNOSIS — I890 Lymphedema, not elsewhere classified: Secondary | ICD-10-CM | POA: Diagnosis not present

## 2020-05-24 DIAGNOSIS — E1151 Type 2 diabetes mellitus with diabetic peripheral angiopathy without gangrene: Secondary | ICD-10-CM | POA: Diagnosis not present

## 2020-05-24 DIAGNOSIS — Z602 Problems related to living alone: Secondary | ICD-10-CM | POA: Diagnosis not present

## 2020-05-24 DIAGNOSIS — Z8616 Personal history of COVID-19: Secondary | ICD-10-CM | POA: Diagnosis not present

## 2020-05-24 DIAGNOSIS — I130 Hypertensive heart and chronic kidney disease with heart failure and stage 1 through stage 4 chronic kidney disease, or unspecified chronic kidney disease: Secondary | ICD-10-CM | POA: Diagnosis not present

## 2020-05-24 DIAGNOSIS — E1122 Type 2 diabetes mellitus with diabetic chronic kidney disease: Secondary | ICD-10-CM | POA: Diagnosis not present

## 2020-05-24 DIAGNOSIS — Z9181 History of falling: Secondary | ICD-10-CM | POA: Diagnosis not present

## 2020-05-24 DIAGNOSIS — I872 Venous insufficiency (chronic) (peripheral): Secondary | ICD-10-CM | POA: Diagnosis not present

## 2020-05-24 DIAGNOSIS — N184 Chronic kidney disease, stage 4 (severe): Secondary | ICD-10-CM | POA: Diagnosis not present

## 2020-05-24 DIAGNOSIS — S8012XA Contusion of left lower leg, initial encounter: Secondary | ICD-10-CM | POA: Diagnosis not present

## 2020-05-24 DIAGNOSIS — I5032 Chronic diastolic (congestive) heart failure: Secondary | ICD-10-CM | POA: Diagnosis not present

## 2020-05-24 DIAGNOSIS — Z7901 Long term (current) use of anticoagulants: Secondary | ICD-10-CM | POA: Diagnosis not present

## 2020-05-24 DIAGNOSIS — I4821 Permanent atrial fibrillation: Secondary | ICD-10-CM | POA: Diagnosis not present

## 2020-05-24 DIAGNOSIS — L97829 Non-pressure chronic ulcer of other part of left lower leg with unspecified severity: Secondary | ICD-10-CM | POA: Diagnosis not present

## 2020-05-25 DIAGNOSIS — N184 Chronic kidney disease, stage 4 (severe): Secondary | ICD-10-CM | POA: Diagnosis not present

## 2020-05-25 DIAGNOSIS — E1122 Type 2 diabetes mellitus with diabetic chronic kidney disease: Secondary | ICD-10-CM | POA: Diagnosis not present

## 2020-05-25 DIAGNOSIS — I130 Hypertensive heart and chronic kidney disease with heart failure and stage 1 through stage 4 chronic kidney disease, or unspecified chronic kidney disease: Secondary | ICD-10-CM | POA: Diagnosis not present

## 2020-05-25 DIAGNOSIS — I4821 Permanent atrial fibrillation: Secondary | ICD-10-CM | POA: Diagnosis not present

## 2020-05-25 DIAGNOSIS — E1151 Type 2 diabetes mellitus with diabetic peripheral angiopathy without gangrene: Secondary | ICD-10-CM | POA: Diagnosis not present

## 2020-05-25 DIAGNOSIS — I5032 Chronic diastolic (congestive) heart failure: Secondary | ICD-10-CM | POA: Diagnosis not present

## 2020-05-27 DIAGNOSIS — I4821 Permanent atrial fibrillation: Secondary | ICD-10-CM | POA: Diagnosis not present

## 2020-05-27 DIAGNOSIS — N184 Chronic kidney disease, stage 4 (severe): Secondary | ICD-10-CM | POA: Diagnosis not present

## 2020-05-27 DIAGNOSIS — I130 Hypertensive heart and chronic kidney disease with heart failure and stage 1 through stage 4 chronic kidney disease, or unspecified chronic kidney disease: Secondary | ICD-10-CM | POA: Diagnosis not present

## 2020-05-27 DIAGNOSIS — E1151 Type 2 diabetes mellitus with diabetic peripheral angiopathy without gangrene: Secondary | ICD-10-CM | POA: Diagnosis not present

## 2020-05-27 DIAGNOSIS — I5032 Chronic diastolic (congestive) heart failure: Secondary | ICD-10-CM | POA: Diagnosis not present

## 2020-05-27 DIAGNOSIS — E1122 Type 2 diabetes mellitus with diabetic chronic kidney disease: Secondary | ICD-10-CM | POA: Diagnosis not present

## 2020-05-28 DIAGNOSIS — N184 Chronic kidney disease, stage 4 (severe): Secondary | ICD-10-CM | POA: Diagnosis not present

## 2020-05-28 DIAGNOSIS — I5032 Chronic diastolic (congestive) heart failure: Secondary | ICD-10-CM | POA: Diagnosis not present

## 2020-05-28 DIAGNOSIS — E1151 Type 2 diabetes mellitus with diabetic peripheral angiopathy without gangrene: Secondary | ICD-10-CM | POA: Diagnosis not present

## 2020-05-28 DIAGNOSIS — I4821 Permanent atrial fibrillation: Secondary | ICD-10-CM | POA: Diagnosis not present

## 2020-05-28 DIAGNOSIS — I130 Hypertensive heart and chronic kidney disease with heart failure and stage 1 through stage 4 chronic kidney disease, or unspecified chronic kidney disease: Secondary | ICD-10-CM | POA: Diagnosis not present

## 2020-05-28 DIAGNOSIS — E1122 Type 2 diabetes mellitus with diabetic chronic kidney disease: Secondary | ICD-10-CM | POA: Diagnosis not present

## 2020-06-02 DIAGNOSIS — I130 Hypertensive heart and chronic kidney disease with heart failure and stage 1 through stage 4 chronic kidney disease, or unspecified chronic kidney disease: Secondary | ICD-10-CM | POA: Diagnosis not present

## 2020-06-02 DIAGNOSIS — N184 Chronic kidney disease, stage 4 (severe): Secondary | ICD-10-CM | POA: Diagnosis not present

## 2020-06-02 DIAGNOSIS — E1122 Type 2 diabetes mellitus with diabetic chronic kidney disease: Secondary | ICD-10-CM | POA: Diagnosis not present

## 2020-06-02 DIAGNOSIS — I5032 Chronic diastolic (congestive) heart failure: Secondary | ICD-10-CM | POA: Diagnosis not present

## 2020-06-02 DIAGNOSIS — I4821 Permanent atrial fibrillation: Secondary | ICD-10-CM | POA: Diagnosis not present

## 2020-06-02 DIAGNOSIS — E1151 Type 2 diabetes mellitus with diabetic peripheral angiopathy without gangrene: Secondary | ICD-10-CM | POA: Diagnosis not present

## 2020-06-05 DIAGNOSIS — Z87891 Personal history of nicotine dependence: Secondary | ICD-10-CM | POA: Diagnosis not present

## 2020-06-05 DIAGNOSIS — I130 Hypertensive heart and chronic kidney disease with heart failure and stage 1 through stage 4 chronic kidney disease, or unspecified chronic kidney disease: Secondary | ICD-10-CM | POA: Diagnosis not present

## 2020-06-05 DIAGNOSIS — I4821 Permanent atrial fibrillation: Secondary | ICD-10-CM | POA: Diagnosis not present

## 2020-06-05 DIAGNOSIS — E1122 Type 2 diabetes mellitus with diabetic chronic kidney disease: Secondary | ICD-10-CM | POA: Diagnosis not present

## 2020-06-05 DIAGNOSIS — Z79899 Other long term (current) drug therapy: Secondary | ICD-10-CM | POA: Diagnosis not present

## 2020-06-05 DIAGNOSIS — I5032 Chronic diastolic (congestive) heart failure: Secondary | ICD-10-CM | POA: Diagnosis not present

## 2020-06-05 DIAGNOSIS — I2510 Atherosclerotic heart disease of native coronary artery without angina pectoris: Secondary | ICD-10-CM | POA: Diagnosis not present

## 2020-06-05 DIAGNOSIS — I878 Other specified disorders of veins: Secondary | ICD-10-CM | POA: Diagnosis not present

## 2020-06-05 DIAGNOSIS — N184 Chronic kidney disease, stage 4 (severe): Secondary | ICD-10-CM | POA: Diagnosis not present

## 2020-06-05 DIAGNOSIS — I129 Hypertensive chronic kidney disease with stage 1 through stage 4 chronic kidney disease, or unspecified chronic kidney disease: Secondary | ICD-10-CM | POA: Diagnosis not present

## 2020-06-05 DIAGNOSIS — N183 Chronic kidney disease, stage 3 unspecified: Secondary | ICD-10-CM | POA: Diagnosis not present

## 2020-06-05 DIAGNOSIS — E1151 Type 2 diabetes mellitus with diabetic peripheral angiopathy without gangrene: Secondary | ICD-10-CM | POA: Diagnosis not present

## 2020-06-05 DIAGNOSIS — I890 Lymphedema, not elsewhere classified: Secondary | ICD-10-CM | POA: Diagnosis not present

## 2020-06-05 DIAGNOSIS — E785 Hyperlipidemia, unspecified: Secondary | ICD-10-CM | POA: Diagnosis not present

## 2020-06-05 DIAGNOSIS — Z87442 Personal history of urinary calculi: Secondary | ICD-10-CM | POA: Diagnosis not present

## 2020-06-09 DIAGNOSIS — E1122 Type 2 diabetes mellitus with diabetic chronic kidney disease: Secondary | ICD-10-CM | POA: Diagnosis not present

## 2020-06-09 DIAGNOSIS — E1151 Type 2 diabetes mellitus with diabetic peripheral angiopathy without gangrene: Secondary | ICD-10-CM | POA: Diagnosis not present

## 2020-06-09 DIAGNOSIS — I5032 Chronic diastolic (congestive) heart failure: Secondary | ICD-10-CM | POA: Diagnosis not present

## 2020-06-09 DIAGNOSIS — I130 Hypertensive heart and chronic kidney disease with heart failure and stage 1 through stage 4 chronic kidney disease, or unspecified chronic kidney disease: Secondary | ICD-10-CM | POA: Diagnosis not present

## 2020-06-09 DIAGNOSIS — I4821 Permanent atrial fibrillation: Secondary | ICD-10-CM | POA: Diagnosis not present

## 2020-06-09 DIAGNOSIS — N184 Chronic kidney disease, stage 4 (severe): Secondary | ICD-10-CM | POA: Diagnosis not present

## 2020-06-12 DIAGNOSIS — I4821 Permanent atrial fibrillation: Secondary | ICD-10-CM | POA: Diagnosis not present

## 2020-06-12 DIAGNOSIS — E1122 Type 2 diabetes mellitus with diabetic chronic kidney disease: Secondary | ICD-10-CM | POA: Diagnosis not present

## 2020-06-12 DIAGNOSIS — I5032 Chronic diastolic (congestive) heart failure: Secondary | ICD-10-CM | POA: Diagnosis not present

## 2020-06-12 DIAGNOSIS — N184 Chronic kidney disease, stage 4 (severe): Secondary | ICD-10-CM | POA: Diagnosis not present

## 2020-06-12 DIAGNOSIS — E1151 Type 2 diabetes mellitus with diabetic peripheral angiopathy without gangrene: Secondary | ICD-10-CM | POA: Diagnosis not present

## 2020-06-12 DIAGNOSIS — I130 Hypertensive heart and chronic kidney disease with heart failure and stage 1 through stage 4 chronic kidney disease, or unspecified chronic kidney disease: Secondary | ICD-10-CM | POA: Diagnosis not present

## 2020-06-16 DIAGNOSIS — I4821 Permanent atrial fibrillation: Secondary | ICD-10-CM | POA: Diagnosis not present

## 2020-06-16 DIAGNOSIS — E1151 Type 2 diabetes mellitus with diabetic peripheral angiopathy without gangrene: Secondary | ICD-10-CM | POA: Diagnosis not present

## 2020-06-16 DIAGNOSIS — N184 Chronic kidney disease, stage 4 (severe): Secondary | ICD-10-CM | POA: Diagnosis not present

## 2020-06-16 DIAGNOSIS — E1122 Type 2 diabetes mellitus with diabetic chronic kidney disease: Secondary | ICD-10-CM | POA: Diagnosis not present

## 2020-06-16 DIAGNOSIS — I130 Hypertensive heart and chronic kidney disease with heart failure and stage 1 through stage 4 chronic kidney disease, or unspecified chronic kidney disease: Secondary | ICD-10-CM | POA: Diagnosis not present

## 2020-06-16 DIAGNOSIS — I5032 Chronic diastolic (congestive) heart failure: Secondary | ICD-10-CM | POA: Diagnosis not present

## 2020-06-21 DIAGNOSIS — I482 Chronic atrial fibrillation, unspecified: Secondary | ICD-10-CM | POA: Diagnosis not present

## 2020-06-21 DIAGNOSIS — I700 Atherosclerosis of aorta: Secondary | ICD-10-CM | POA: Diagnosis not present

## 2020-06-21 DIAGNOSIS — K2100 Gastro-esophageal reflux disease with esophagitis, without bleeding: Secondary | ICD-10-CM | POA: Diagnosis not present

## 2020-06-23 DIAGNOSIS — Z8616 Personal history of COVID-19: Secondary | ICD-10-CM | POA: Diagnosis not present

## 2020-06-23 DIAGNOSIS — N184 Chronic kidney disease, stage 4 (severe): Secondary | ICD-10-CM | POA: Diagnosis not present

## 2020-06-23 DIAGNOSIS — Z602 Problems related to living alone: Secondary | ICD-10-CM | POA: Diagnosis not present

## 2020-06-23 DIAGNOSIS — I130 Hypertensive heart and chronic kidney disease with heart failure and stage 1 through stage 4 chronic kidney disease, or unspecified chronic kidney disease: Secondary | ICD-10-CM | POA: Diagnosis not present

## 2020-06-23 DIAGNOSIS — S8012XA Contusion of left lower leg, initial encounter: Secondary | ICD-10-CM | POA: Diagnosis not present

## 2020-06-23 DIAGNOSIS — E1122 Type 2 diabetes mellitus with diabetic chronic kidney disease: Secondary | ICD-10-CM | POA: Diagnosis not present

## 2020-06-23 DIAGNOSIS — L97829 Non-pressure chronic ulcer of other part of left lower leg with unspecified severity: Secondary | ICD-10-CM | POA: Diagnosis not present

## 2020-06-23 DIAGNOSIS — Z7901 Long term (current) use of anticoagulants: Secondary | ICD-10-CM | POA: Diagnosis not present

## 2020-06-23 DIAGNOSIS — E1151 Type 2 diabetes mellitus with diabetic peripheral angiopathy without gangrene: Secondary | ICD-10-CM | POA: Diagnosis not present

## 2020-06-23 DIAGNOSIS — Z9181 History of falling: Secondary | ICD-10-CM | POA: Diagnosis not present

## 2020-06-23 DIAGNOSIS — I4821 Permanent atrial fibrillation: Secondary | ICD-10-CM | POA: Diagnosis not present

## 2020-06-23 DIAGNOSIS — I872 Venous insufficiency (chronic) (peripheral): Secondary | ICD-10-CM | POA: Diagnosis not present

## 2020-06-23 DIAGNOSIS — I5032 Chronic diastolic (congestive) heart failure: Secondary | ICD-10-CM | POA: Diagnosis not present

## 2020-06-24 DIAGNOSIS — I5032 Chronic diastolic (congestive) heart failure: Secondary | ICD-10-CM | POA: Diagnosis not present

## 2020-06-24 DIAGNOSIS — E1122 Type 2 diabetes mellitus with diabetic chronic kidney disease: Secondary | ICD-10-CM | POA: Diagnosis not present

## 2020-06-24 DIAGNOSIS — I4821 Permanent atrial fibrillation: Secondary | ICD-10-CM | POA: Diagnosis not present

## 2020-06-24 DIAGNOSIS — E1151 Type 2 diabetes mellitus with diabetic peripheral angiopathy without gangrene: Secondary | ICD-10-CM | POA: Diagnosis not present

## 2020-06-24 DIAGNOSIS — I130 Hypertensive heart and chronic kidney disease with heart failure and stage 1 through stage 4 chronic kidney disease, or unspecified chronic kidney disease: Secondary | ICD-10-CM | POA: Diagnosis not present

## 2020-06-24 DIAGNOSIS — N184 Chronic kidney disease, stage 4 (severe): Secondary | ICD-10-CM | POA: Diagnosis not present

## 2020-06-26 DIAGNOSIS — E11622 Type 2 diabetes mellitus with other skin ulcer: Secondary | ICD-10-CM | POA: Diagnosis not present

## 2020-06-26 DIAGNOSIS — I2510 Atherosclerotic heart disease of native coronary artery without angina pectoris: Secondary | ICD-10-CM | POA: Diagnosis not present

## 2020-06-26 DIAGNOSIS — Z87891 Personal history of nicotine dependence: Secondary | ICD-10-CM | POA: Diagnosis not present

## 2020-06-26 DIAGNOSIS — Z9049 Acquired absence of other specified parts of digestive tract: Secondary | ICD-10-CM | POA: Diagnosis not present

## 2020-06-26 DIAGNOSIS — I480 Paroxysmal atrial fibrillation: Secondary | ICD-10-CM | POA: Diagnosis not present

## 2020-06-26 DIAGNOSIS — I890 Lymphedema, not elsewhere classified: Secondary | ICD-10-CM | POA: Diagnosis not present

## 2020-06-26 DIAGNOSIS — K219 Gastro-esophageal reflux disease without esophagitis: Secondary | ICD-10-CM | POA: Diagnosis not present

## 2020-06-26 DIAGNOSIS — Z79899 Other long term (current) drug therapy: Secondary | ICD-10-CM | POA: Diagnosis not present

## 2020-06-26 DIAGNOSIS — N183 Chronic kidney disease, stage 3 unspecified: Secondary | ICD-10-CM | POA: Diagnosis not present

## 2020-06-26 DIAGNOSIS — I129 Hypertensive chronic kidney disease with stage 1 through stage 4 chronic kidney disease, or unspecified chronic kidney disease: Secondary | ICD-10-CM | POA: Diagnosis not present

## 2020-06-26 DIAGNOSIS — I83022 Varicose veins of left lower extremity with ulcer of calf: Secondary | ICD-10-CM | POA: Diagnosis not present

## 2020-06-26 DIAGNOSIS — Z7901 Long term (current) use of anticoagulants: Secondary | ICD-10-CM | POA: Diagnosis not present

## 2020-06-26 DIAGNOSIS — Z4800 Encounter for change or removal of nonsurgical wound dressing: Secondary | ICD-10-CM | POA: Diagnosis not present

## 2020-06-26 DIAGNOSIS — Z86718 Personal history of other venous thrombosis and embolism: Secondary | ICD-10-CM | POA: Diagnosis not present

## 2020-06-26 DIAGNOSIS — E1122 Type 2 diabetes mellitus with diabetic chronic kidney disease: Secondary | ICD-10-CM | POA: Diagnosis not present

## 2020-06-26 DIAGNOSIS — Z8673 Personal history of transient ischemic attack (TIA), and cerebral infarction without residual deficits: Secondary | ICD-10-CM | POA: Diagnosis not present

## 2020-06-26 DIAGNOSIS — E1151 Type 2 diabetes mellitus with diabetic peripheral angiopathy without gangrene: Secondary | ICD-10-CM | POA: Diagnosis not present

## 2020-06-26 DIAGNOSIS — L97221 Non-pressure chronic ulcer of left calf limited to breakdown of skin: Secondary | ICD-10-CM | POA: Diagnosis not present

## 2020-06-26 DIAGNOSIS — E785 Hyperlipidemia, unspecified: Secondary | ICD-10-CM | POA: Diagnosis not present

## 2020-06-26 DIAGNOSIS — T07XXXA Unspecified multiple injuries, initial encounter: Secondary | ICD-10-CM | POA: Diagnosis not present

## 2020-06-29 DIAGNOSIS — E1122 Type 2 diabetes mellitus with diabetic chronic kidney disease: Secondary | ICD-10-CM | POA: Diagnosis not present

## 2020-06-29 DIAGNOSIS — N184 Chronic kidney disease, stage 4 (severe): Secondary | ICD-10-CM | POA: Diagnosis not present

## 2020-06-29 DIAGNOSIS — I5032 Chronic diastolic (congestive) heart failure: Secondary | ICD-10-CM | POA: Diagnosis not present

## 2020-06-29 DIAGNOSIS — I4821 Permanent atrial fibrillation: Secondary | ICD-10-CM | POA: Diagnosis not present

## 2020-06-29 DIAGNOSIS — E1151 Type 2 diabetes mellitus with diabetic peripheral angiopathy without gangrene: Secondary | ICD-10-CM | POA: Diagnosis not present

## 2020-06-29 DIAGNOSIS — I130 Hypertensive heart and chronic kidney disease with heart failure and stage 1 through stage 4 chronic kidney disease, or unspecified chronic kidney disease: Secondary | ICD-10-CM | POA: Diagnosis not present

## 2020-07-01 DIAGNOSIS — N183 Chronic kidney disease, stage 3 unspecified: Secondary | ICD-10-CM | POA: Diagnosis not present

## 2020-07-01 DIAGNOSIS — I10 Essential (primary) hypertension: Secondary | ICD-10-CM | POA: Diagnosis not present

## 2020-07-01 DIAGNOSIS — E875 Hyperkalemia: Secondary | ICD-10-CM | POA: Diagnosis not present

## 2020-07-01 DIAGNOSIS — D649 Anemia, unspecified: Secondary | ICD-10-CM | POA: Diagnosis not present

## 2020-07-01 DIAGNOSIS — E7849 Other hyperlipidemia: Secondary | ICD-10-CM | POA: Diagnosis not present

## 2020-07-01 DIAGNOSIS — E782 Mixed hyperlipidemia: Secondary | ICD-10-CM | POA: Diagnosis not present

## 2020-07-01 DIAGNOSIS — E1122 Type 2 diabetes mellitus with diabetic chronic kidney disease: Secondary | ICD-10-CM | POA: Diagnosis not present

## 2020-07-02 DIAGNOSIS — E1151 Type 2 diabetes mellitus with diabetic peripheral angiopathy without gangrene: Secondary | ICD-10-CM | POA: Diagnosis not present

## 2020-07-02 DIAGNOSIS — I4821 Permanent atrial fibrillation: Secondary | ICD-10-CM | POA: Diagnosis not present

## 2020-07-02 DIAGNOSIS — I5032 Chronic diastolic (congestive) heart failure: Secondary | ICD-10-CM | POA: Diagnosis not present

## 2020-07-02 DIAGNOSIS — N184 Chronic kidney disease, stage 4 (severe): Secondary | ICD-10-CM | POA: Diagnosis not present

## 2020-07-02 DIAGNOSIS — E1122 Type 2 diabetes mellitus with diabetic chronic kidney disease: Secondary | ICD-10-CM | POA: Diagnosis not present

## 2020-07-02 DIAGNOSIS — I130 Hypertensive heart and chronic kidney disease with heart failure and stage 1 through stage 4 chronic kidney disease, or unspecified chronic kidney disease: Secondary | ICD-10-CM | POA: Diagnosis not present

## 2020-07-07 DIAGNOSIS — M353 Polymyalgia rheumatica: Secondary | ICD-10-CM | POA: Diagnosis not present

## 2020-07-07 DIAGNOSIS — I4821 Permanent atrial fibrillation: Secondary | ICD-10-CM | POA: Diagnosis not present

## 2020-07-07 DIAGNOSIS — I130 Hypertensive heart and chronic kidney disease with heart failure and stage 1 through stage 4 chronic kidney disease, or unspecified chronic kidney disease: Secondary | ICD-10-CM | POA: Diagnosis not present

## 2020-07-07 DIAGNOSIS — I10 Essential (primary) hypertension: Secondary | ICD-10-CM | POA: Diagnosis not present

## 2020-07-07 DIAGNOSIS — E1151 Type 2 diabetes mellitus with diabetic peripheral angiopathy without gangrene: Secondary | ICD-10-CM | POA: Diagnosis not present

## 2020-07-07 DIAGNOSIS — N184 Chronic kidney disease, stage 4 (severe): Secondary | ICD-10-CM | POA: Diagnosis not present

## 2020-07-07 DIAGNOSIS — E1122 Type 2 diabetes mellitus with diabetic chronic kidney disease: Secondary | ICD-10-CM | POA: Diagnosis not present

## 2020-07-07 DIAGNOSIS — E7849 Other hyperlipidemia: Secondary | ICD-10-CM | POA: Diagnosis not present

## 2020-07-07 DIAGNOSIS — Z0001 Encounter for general adult medical examination with abnormal findings: Secondary | ICD-10-CM | POA: Diagnosis not present

## 2020-07-07 DIAGNOSIS — I5032 Chronic diastolic (congestive) heart failure: Secondary | ICD-10-CM | POA: Diagnosis not present

## 2020-07-07 DIAGNOSIS — I700 Atherosclerosis of aorta: Secondary | ICD-10-CM | POA: Diagnosis not present

## 2020-07-07 DIAGNOSIS — E1142 Type 2 diabetes mellitus with diabetic polyneuropathy: Secondary | ICD-10-CM | POA: Diagnosis not present

## 2020-07-09 DIAGNOSIS — I5032 Chronic diastolic (congestive) heart failure: Secondary | ICD-10-CM | POA: Diagnosis not present

## 2020-07-09 DIAGNOSIS — E1122 Type 2 diabetes mellitus with diabetic chronic kidney disease: Secondary | ICD-10-CM | POA: Diagnosis not present

## 2020-07-09 DIAGNOSIS — I130 Hypertensive heart and chronic kidney disease with heart failure and stage 1 through stage 4 chronic kidney disease, or unspecified chronic kidney disease: Secondary | ICD-10-CM | POA: Diagnosis not present

## 2020-07-09 DIAGNOSIS — E1151 Type 2 diabetes mellitus with diabetic peripheral angiopathy without gangrene: Secondary | ICD-10-CM | POA: Diagnosis not present

## 2020-07-09 DIAGNOSIS — I4821 Permanent atrial fibrillation: Secondary | ICD-10-CM | POA: Diagnosis not present

## 2020-07-09 DIAGNOSIS — N184 Chronic kidney disease, stage 4 (severe): Secondary | ICD-10-CM | POA: Diagnosis not present

## 2020-07-10 DIAGNOSIS — I130 Hypertensive heart and chronic kidney disease with heart failure and stage 1 through stage 4 chronic kidney disease, or unspecified chronic kidney disease: Secondary | ICD-10-CM | POA: Diagnosis not present

## 2020-07-10 DIAGNOSIS — E1122 Type 2 diabetes mellitus with diabetic chronic kidney disease: Secondary | ICD-10-CM | POA: Diagnosis not present

## 2020-07-10 DIAGNOSIS — I5032 Chronic diastolic (congestive) heart failure: Secondary | ICD-10-CM | POA: Diagnosis not present

## 2020-07-10 DIAGNOSIS — E1151 Type 2 diabetes mellitus with diabetic peripheral angiopathy without gangrene: Secondary | ICD-10-CM | POA: Diagnosis not present

## 2020-07-10 DIAGNOSIS — I4821 Permanent atrial fibrillation: Secondary | ICD-10-CM | POA: Diagnosis not present

## 2020-07-10 DIAGNOSIS — N184 Chronic kidney disease, stage 4 (severe): Secondary | ICD-10-CM | POA: Diagnosis not present

## 2020-07-14 DIAGNOSIS — I130 Hypertensive heart and chronic kidney disease with heart failure and stage 1 through stage 4 chronic kidney disease, or unspecified chronic kidney disease: Secondary | ICD-10-CM | POA: Diagnosis not present

## 2020-07-14 DIAGNOSIS — E1122 Type 2 diabetes mellitus with diabetic chronic kidney disease: Secondary | ICD-10-CM | POA: Diagnosis not present

## 2020-07-14 DIAGNOSIS — N184 Chronic kidney disease, stage 4 (severe): Secondary | ICD-10-CM | POA: Diagnosis not present

## 2020-07-14 DIAGNOSIS — E1151 Type 2 diabetes mellitus with diabetic peripheral angiopathy without gangrene: Secondary | ICD-10-CM | POA: Diagnosis not present

## 2020-07-14 DIAGNOSIS — I5032 Chronic diastolic (congestive) heart failure: Secondary | ICD-10-CM | POA: Diagnosis not present

## 2020-07-14 DIAGNOSIS — I4821 Permanent atrial fibrillation: Secondary | ICD-10-CM | POA: Diagnosis not present

## 2020-07-16 DIAGNOSIS — I130 Hypertensive heart and chronic kidney disease with heart failure and stage 1 through stage 4 chronic kidney disease, or unspecified chronic kidney disease: Secondary | ICD-10-CM | POA: Diagnosis not present

## 2020-07-16 DIAGNOSIS — N184 Chronic kidney disease, stage 4 (severe): Secondary | ICD-10-CM | POA: Diagnosis not present

## 2020-07-16 DIAGNOSIS — E1151 Type 2 diabetes mellitus with diabetic peripheral angiopathy without gangrene: Secondary | ICD-10-CM | POA: Diagnosis not present

## 2020-07-16 DIAGNOSIS — I4821 Permanent atrial fibrillation: Secondary | ICD-10-CM | POA: Diagnosis not present

## 2020-07-16 DIAGNOSIS — E1122 Type 2 diabetes mellitus with diabetic chronic kidney disease: Secondary | ICD-10-CM | POA: Diagnosis not present

## 2020-07-16 DIAGNOSIS — I5032 Chronic diastolic (congestive) heart failure: Secondary | ICD-10-CM | POA: Diagnosis not present

## 2020-07-17 DIAGNOSIS — E11622 Type 2 diabetes mellitus with other skin ulcer: Secondary | ICD-10-CM | POA: Diagnosis not present

## 2020-07-17 DIAGNOSIS — E1122 Type 2 diabetes mellitus with diabetic chronic kidney disease: Secondary | ICD-10-CM | POA: Diagnosis not present

## 2020-07-17 DIAGNOSIS — Z4800 Encounter for change or removal of nonsurgical wound dressing: Secondary | ICD-10-CM | POA: Diagnosis not present

## 2020-07-17 DIAGNOSIS — I890 Lymphedema, not elsewhere classified: Secondary | ICD-10-CM | POA: Diagnosis not present

## 2020-07-17 DIAGNOSIS — L97221 Non-pressure chronic ulcer of left calf limited to breakdown of skin: Secondary | ICD-10-CM | POA: Diagnosis not present

## 2020-07-17 DIAGNOSIS — I83022 Varicose veins of left lower extremity with ulcer of calf: Secondary | ICD-10-CM | POA: Diagnosis not present

## 2020-07-17 DIAGNOSIS — E1151 Type 2 diabetes mellitus with diabetic peripheral angiopathy without gangrene: Secondary | ICD-10-CM | POA: Diagnosis not present

## 2020-07-21 DIAGNOSIS — N184 Chronic kidney disease, stage 4 (severe): Secondary | ICD-10-CM | POA: Diagnosis not present

## 2020-07-21 DIAGNOSIS — E1151 Type 2 diabetes mellitus with diabetic peripheral angiopathy without gangrene: Secondary | ICD-10-CM | POA: Diagnosis not present

## 2020-07-21 DIAGNOSIS — I5032 Chronic diastolic (congestive) heart failure: Secondary | ICD-10-CM | POA: Diagnosis not present

## 2020-07-21 DIAGNOSIS — E1122 Type 2 diabetes mellitus with diabetic chronic kidney disease: Secondary | ICD-10-CM | POA: Diagnosis not present

## 2020-07-21 DIAGNOSIS — I4821 Permanent atrial fibrillation: Secondary | ICD-10-CM | POA: Diagnosis not present

## 2020-07-21 DIAGNOSIS — I130 Hypertensive heart and chronic kidney disease with heart failure and stage 1 through stage 4 chronic kidney disease, or unspecified chronic kidney disease: Secondary | ICD-10-CM | POA: Diagnosis not present

## 2020-07-23 DIAGNOSIS — I5032 Chronic diastolic (congestive) heart failure: Secondary | ICD-10-CM | POA: Diagnosis not present

## 2020-07-23 DIAGNOSIS — I4821 Permanent atrial fibrillation: Secondary | ICD-10-CM | POA: Diagnosis not present

## 2020-07-23 DIAGNOSIS — Z9181 History of falling: Secondary | ICD-10-CM | POA: Diagnosis not present

## 2020-07-23 DIAGNOSIS — Z7901 Long term (current) use of anticoagulants: Secondary | ICD-10-CM | POA: Diagnosis not present

## 2020-07-23 DIAGNOSIS — I83228 Varicose veins of left lower extremity with both ulcer of other part of lower extremity and inflammation: Secondary | ICD-10-CM | POA: Diagnosis not present

## 2020-07-23 DIAGNOSIS — Z8616 Personal history of COVID-19: Secondary | ICD-10-CM | POA: Diagnosis not present

## 2020-07-23 DIAGNOSIS — N184 Chronic kidney disease, stage 4 (severe): Secondary | ICD-10-CM | POA: Diagnosis not present

## 2020-07-23 DIAGNOSIS — E1122 Type 2 diabetes mellitus with diabetic chronic kidney disease: Secondary | ICD-10-CM | POA: Diagnosis not present

## 2020-07-23 DIAGNOSIS — E1151 Type 2 diabetes mellitus with diabetic peripheral angiopathy without gangrene: Secondary | ICD-10-CM | POA: Diagnosis not present

## 2020-07-23 DIAGNOSIS — L97821 Non-pressure chronic ulcer of other part of left lower leg limited to breakdown of skin: Secondary | ICD-10-CM | POA: Diagnosis not present

## 2020-07-23 DIAGNOSIS — I130 Hypertensive heart and chronic kidney disease with heart failure and stage 1 through stage 4 chronic kidney disease, or unspecified chronic kidney disease: Secondary | ICD-10-CM | POA: Diagnosis not present

## 2020-07-23 DIAGNOSIS — Z602 Problems related to living alone: Secondary | ICD-10-CM | POA: Diagnosis not present

## 2020-07-28 DIAGNOSIS — I130 Hypertensive heart and chronic kidney disease with heart failure and stage 1 through stage 4 chronic kidney disease, or unspecified chronic kidney disease: Secondary | ICD-10-CM | POA: Diagnosis not present

## 2020-07-28 DIAGNOSIS — E1151 Type 2 diabetes mellitus with diabetic peripheral angiopathy without gangrene: Secondary | ICD-10-CM | POA: Diagnosis not present

## 2020-07-28 DIAGNOSIS — I83228 Varicose veins of left lower extremity with both ulcer of other part of lower extremity and inflammation: Secondary | ICD-10-CM | POA: Diagnosis not present

## 2020-07-28 DIAGNOSIS — I5032 Chronic diastolic (congestive) heart failure: Secondary | ICD-10-CM | POA: Diagnosis not present

## 2020-07-28 DIAGNOSIS — I4821 Permanent atrial fibrillation: Secondary | ICD-10-CM | POA: Diagnosis not present

## 2020-07-28 DIAGNOSIS — L97821 Non-pressure chronic ulcer of other part of left lower leg limited to breakdown of skin: Secondary | ICD-10-CM | POA: Diagnosis not present

## 2020-07-31 ENCOUNTER — Other Ambulatory Visit: Payer: Self-pay

## 2020-07-31 ENCOUNTER — Emergency Department (HOSPITAL_COMMUNITY)
Admission: EM | Admit: 2020-07-31 | Discharge: 2020-07-31 | Disposition: A | Discharge status: Home / Self Care | Payer: Medicare Other | Attending: Emergency Medicine | Admitting: Emergency Medicine

## 2020-07-31 ENCOUNTER — Emergency Department (HOSPITAL_COMMUNITY): Payer: Medicare Other

## 2020-07-31 ENCOUNTER — Encounter (HOSPITAL_COMMUNITY): Payer: Self-pay | Admitting: *Deleted

## 2020-07-31 DIAGNOSIS — E119 Type 2 diabetes mellitus without complications: Secondary | ICD-10-CM | POA: Insufficient documentation

## 2020-07-31 DIAGNOSIS — N179 Acute kidney failure, unspecified: Secondary | ICD-10-CM | POA: Diagnosis not present

## 2020-07-31 DIAGNOSIS — N184 Chronic kidney disease, stage 4 (severe): Secondary | ICD-10-CM | POA: Insufficient documentation

## 2020-07-31 DIAGNOSIS — Z96653 Presence of artificial knee joint, bilateral: Secondary | ICD-10-CM | POA: Insufficient documentation

## 2020-07-31 DIAGNOSIS — I129 Hypertensive chronic kidney disease with stage 1 through stage 4 chronic kidney disease, or unspecified chronic kidney disease: Secondary | ICD-10-CM | POA: Insufficient documentation

## 2020-07-31 DIAGNOSIS — N289 Disorder of kidney and ureter, unspecified: Secondary | ICD-10-CM | POA: Diagnosis not present

## 2020-07-31 DIAGNOSIS — I890 Lymphedema, not elsewhere classified: Secondary | ICD-10-CM | POA: Diagnosis not present

## 2020-07-31 DIAGNOSIS — R443 Hallucinations, unspecified: Secondary | ICD-10-CM | POA: Diagnosis present

## 2020-07-31 DIAGNOSIS — Z87891 Personal history of nicotine dependence: Secondary | ICD-10-CM | POA: Diagnosis not present

## 2020-07-31 DIAGNOSIS — R4182 Altered mental status, unspecified: Secondary | ICD-10-CM | POA: Diagnosis not present

## 2020-07-31 DIAGNOSIS — I10 Essential (primary) hypertension: Secondary | ICD-10-CM | POA: Diagnosis not present

## 2020-07-31 DIAGNOSIS — J90 Pleural effusion, not elsewhere classified: Secondary | ICD-10-CM | POA: Diagnosis not present

## 2020-07-31 DIAGNOSIS — R531 Weakness: Secondary | ICD-10-CM | POA: Diagnosis not present

## 2020-07-31 DIAGNOSIS — J811 Chronic pulmonary edema: Secondary | ICD-10-CM | POA: Diagnosis not present

## 2020-07-31 DIAGNOSIS — I672 Cerebral atherosclerosis: Secondary | ICD-10-CM | POA: Diagnosis not present

## 2020-07-31 DIAGNOSIS — I83022 Varicose veins of left lower extremity with ulcer of calf: Secondary | ICD-10-CM | POA: Diagnosis not present

## 2020-07-31 DIAGNOSIS — Z9049 Acquired absence of other specified parts of digestive tract: Secondary | ICD-10-CM | POA: Diagnosis not present

## 2020-07-31 DIAGNOSIS — E1151 Type 2 diabetes mellitus with diabetic peripheral angiopathy without gangrene: Secondary | ICD-10-CM | POA: Diagnosis not present

## 2020-07-31 DIAGNOSIS — Z85828 Personal history of other malignant neoplasm of skin: Secondary | ICD-10-CM | POA: Insufficient documentation

## 2020-07-31 DIAGNOSIS — E785 Hyperlipidemia, unspecified: Secondary | ICD-10-CM | POA: Diagnosis not present

## 2020-07-31 DIAGNOSIS — E11622 Type 2 diabetes mellitus with other skin ulcer: Secondary | ICD-10-CM | POA: Diagnosis not present

## 2020-07-31 DIAGNOSIS — I2510 Atherosclerotic heart disease of native coronary artery without angina pectoris: Secondary | ICD-10-CM | POA: Diagnosis not present

## 2020-07-31 DIAGNOSIS — L97221 Non-pressure chronic ulcer of left calf limited to breakdown of skin: Secondary | ICD-10-CM | POA: Diagnosis not present

## 2020-07-31 DIAGNOSIS — I878 Other specified disorders of veins: Secondary | ICD-10-CM | POA: Diagnosis not present

## 2020-07-31 DIAGNOSIS — Z7901 Long term (current) use of anticoagulants: Secondary | ICD-10-CM | POA: Diagnosis not present

## 2020-07-31 DIAGNOSIS — K219 Gastro-esophageal reflux disease without esophagitis: Secondary | ICD-10-CM | POA: Diagnosis not present

## 2020-07-31 DIAGNOSIS — Z79899 Other long term (current) drug therapy: Secondary | ICD-10-CM | POA: Diagnosis not present

## 2020-07-31 DIAGNOSIS — Z86718 Personal history of other venous thrombosis and embolism: Secondary | ICD-10-CM | POA: Diagnosis not present

## 2020-07-31 DIAGNOSIS — N183 Chronic kidney disease, stage 3 unspecified: Secondary | ICD-10-CM | POA: Diagnosis not present

## 2020-07-31 DIAGNOSIS — R410 Disorientation, unspecified: Secondary | ICD-10-CM | POA: Diagnosis not present

## 2020-07-31 DIAGNOSIS — Z8616 Personal history of COVID-19: Secondary | ICD-10-CM | POA: Insufficient documentation

## 2020-07-31 DIAGNOSIS — E1122 Type 2 diabetes mellitus with diabetic chronic kidney disease: Secondary | ICD-10-CM | POA: Diagnosis not present

## 2020-07-31 DIAGNOSIS — Z4800 Encounter for change or removal of nonsurgical wound dressing: Secondary | ICD-10-CM | POA: Diagnosis not present

## 2020-07-31 DIAGNOSIS — I480 Paroxysmal atrial fibrillation: Secondary | ICD-10-CM | POA: Diagnosis not present

## 2020-07-31 LAB — CBC WITH DIFFERENTIAL/PLATELET
Abs Immature Granulocytes: 0.02 10*3/uL (ref 0.00–0.07)
Basophils Absolute: 0 10*3/uL (ref 0.0–0.1)
Basophils Relative: 0 %
Eosinophils Absolute: 0.2 10*3/uL (ref 0.0–0.5)
Eosinophils Relative: 2 %
HCT: 36.9 % — ABNORMAL LOW (ref 39.0–52.0)
Hemoglobin: 11.8 g/dL — ABNORMAL LOW (ref 13.0–17.0)
Immature Granulocytes: 0 %
Lymphocytes Relative: 18 %
Lymphs Abs: 1.4 10*3/uL (ref 0.7–4.0)
MCH: 29.9 pg (ref 26.0–34.0)
MCHC: 32 g/dL (ref 30.0–36.0)
MCV: 93.4 fL (ref 80.0–100.0)
Monocytes Absolute: 0.8 10*3/uL (ref 0.1–1.0)
Monocytes Relative: 10 %
Neutro Abs: 5.6 10*3/uL (ref 1.7–7.7)
Neutrophils Relative %: 70 %
Platelets: 194 10*3/uL (ref 150–400)
RBC: 3.95 MIL/uL — ABNORMAL LOW (ref 4.22–5.81)
RDW: 15 % (ref 11.5–15.5)
WBC: 8 10*3/uL (ref 4.0–10.5)
nRBC: 0 % (ref 0.0–0.2)

## 2020-07-31 LAB — URINALYSIS, ROUTINE W REFLEX MICROSCOPIC
Bacteria, UA: NONE SEEN
Bilirubin Urine: NEGATIVE
Glucose, UA: 50 mg/dL — AB
Ketones, ur: NEGATIVE mg/dL
Leukocytes,Ua: NEGATIVE
Nitrite: NEGATIVE
Protein, ur: >=300 mg/dL — AB
Specific Gravity, Urine: 1.011 (ref 1.005–1.030)
pH: 6 (ref 5.0–8.0)

## 2020-07-31 LAB — COMPREHENSIVE METABOLIC PANEL
ALT: 10 U/L (ref 0–44)
AST: 13 U/L — ABNORMAL LOW (ref 15–41)
Albumin: 3.7 g/dL (ref 3.5–5.0)
Alkaline Phosphatase: 114 U/L (ref 38–126)
Anion gap: 12 (ref 5–15)
BUN: 39 mg/dL — ABNORMAL HIGH (ref 8–23)
CO2: 20 mmol/L — ABNORMAL LOW (ref 22–32)
Calcium: 7.7 mg/dL — ABNORMAL LOW (ref 8.9–10.3)
Chloride: 107 mmol/L (ref 98–111)
Creatinine, Ser: 3.17 mg/dL — ABNORMAL HIGH (ref 0.61–1.24)
GFR, Estimated: 18 mL/min — ABNORMAL LOW (ref >60)
Glucose, Bld: 117 mg/dL — ABNORMAL HIGH (ref 70–99)
Potassium: 3.6 mmol/L (ref 3.5–5.1)
Sodium: 139 mmol/L (ref 135–145)
Total Bilirubin: 0.7 mg/dL (ref 0.3–1.2)
Total Protein: 7.1 g/dL (ref 6.5–8.1)

## 2020-07-31 LAB — CBG MONITORING, ED: Glucose-Capillary: 120 mg/dL — ABNORMAL HIGH (ref 70–99)

## 2020-07-31 MED ORDER — SODIUM CHLORIDE 0.9 % IV BOLUS
500.0000 mL | Freq: Once | INTRAVENOUS | Status: AC
  Administered 2020-07-31: 500 mL via INTRAVENOUS

## 2020-07-31 NOTE — ED Notes (Signed)
Clothes removed and bagged, daughter took home and will bring clean clothes.  Pt with watch still on.

## 2020-07-31 NOTE — ED Notes (Signed)
Pt returned from CT °

## 2020-07-31 NOTE — ED Notes (Signed)
Daughter notified pt is ready for discharge. States she will be here as soon as she can to pick him up.

## 2020-07-31 NOTE — ED Triage Notes (Signed)
Per family member, today patient is weak and is hearing things, and seeing things.

## 2020-07-31 NOTE — ED Notes (Signed)
Patient transported to CT 
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
## 2020-07-31 NOTE — Discharge Instructions (Addendum)
It is not clear why he is having the hallucinations.  He is mildly dehydrated with an elevated BUN and creatinine.  Encouraged him to eat and drink regularly, to improve his hydration status.  Try to assist him with walking, to prevent him from falling.  Continue his regular medications.  Talk to his doctor about further treatments when you see him next week.

## 2020-07-31 NOTE — ED Notes (Signed)
Bladder scan done post void and 175 cc noted in bladder via bladder scan

## 2020-08-01 DIAGNOSIS — E7849 Other hyperlipidemia: Secondary | ICD-10-CM | POA: Diagnosis not present

## 2020-08-01 DIAGNOSIS — I5030 Unspecified diastolic (congestive) heart failure: Secondary | ICD-10-CM | POA: Diagnosis not present

## 2020-08-01 DIAGNOSIS — R5383 Other fatigue: Secondary | ICD-10-CM | POA: Diagnosis not present

## 2020-08-01 DIAGNOSIS — R443 Hallucinations, unspecified: Secondary | ICD-10-CM | POA: Diagnosis not present

## 2020-08-04 DIAGNOSIS — E1151 Type 2 diabetes mellitus with diabetic peripheral angiopathy without gangrene: Secondary | ICD-10-CM | POA: Diagnosis not present

## 2020-08-04 DIAGNOSIS — I83228 Varicose veins of left lower extremity with both ulcer of other part of lower extremity and inflammation: Secondary | ICD-10-CM | POA: Diagnosis not present

## 2020-08-04 DIAGNOSIS — I130 Hypertensive heart and chronic kidney disease with heart failure and stage 1 through stage 4 chronic kidney disease, or unspecified chronic kidney disease: Secondary | ICD-10-CM | POA: Diagnosis not present

## 2020-08-04 DIAGNOSIS — L97821 Non-pressure chronic ulcer of other part of left lower leg limited to breakdown of skin: Secondary | ICD-10-CM | POA: Diagnosis not present

## 2020-08-04 DIAGNOSIS — I5032 Chronic diastolic (congestive) heart failure: Secondary | ICD-10-CM | POA: Diagnosis not present

## 2020-08-04 DIAGNOSIS — I4821 Permanent atrial fibrillation: Secondary | ICD-10-CM | POA: Diagnosis not present

## 2020-08-10 DIAGNOSIS — E1122 Type 2 diabetes mellitus with diabetic chronic kidney disease: Secondary | ICD-10-CM | POA: Diagnosis not present

## 2020-08-10 DIAGNOSIS — L97821 Non-pressure chronic ulcer of other part of left lower leg limited to breakdown of skin: Secondary | ICD-10-CM | POA: Diagnosis not present

## 2020-08-10 DIAGNOSIS — E1151 Type 2 diabetes mellitus with diabetic peripheral angiopathy without gangrene: Secondary | ICD-10-CM | POA: Diagnosis not present

## 2020-08-10 DIAGNOSIS — I4821 Permanent atrial fibrillation: Secondary | ICD-10-CM | POA: Diagnosis not present

## 2020-08-10 DIAGNOSIS — N184 Chronic kidney disease, stage 4 (severe): Secondary | ICD-10-CM | POA: Diagnosis not present

## 2020-08-10 DIAGNOSIS — I130 Hypertensive heart and chronic kidney disease with heart failure and stage 1 through stage 4 chronic kidney disease, or unspecified chronic kidney disease: Secondary | ICD-10-CM | POA: Diagnosis not present

## 2020-08-10 DIAGNOSIS — I5032 Chronic diastolic (congestive) heart failure: Secondary | ICD-10-CM | POA: Diagnosis not present

## 2020-08-10 DIAGNOSIS — I83228 Varicose veins of left lower extremity with both ulcer of other part of lower extremity and inflammation: Secondary | ICD-10-CM | POA: Diagnosis not present

## 2020-08-11 DIAGNOSIS — I130 Hypertensive heart and chronic kidney disease with heart failure and stage 1 through stage 4 chronic kidney disease, or unspecified chronic kidney disease: Secondary | ICD-10-CM | POA: Diagnosis not present

## 2020-08-11 DIAGNOSIS — I4821 Permanent atrial fibrillation: Secondary | ICD-10-CM | POA: Diagnosis not present

## 2020-08-11 DIAGNOSIS — I5032 Chronic diastolic (congestive) heart failure: Secondary | ICD-10-CM | POA: Diagnosis not present

## 2020-08-11 DIAGNOSIS — E1151 Type 2 diabetes mellitus with diabetic peripheral angiopathy without gangrene: Secondary | ICD-10-CM | POA: Diagnosis not present

## 2020-08-11 DIAGNOSIS — L97821 Non-pressure chronic ulcer of other part of left lower leg limited to breakdown of skin: Secondary | ICD-10-CM | POA: Diagnosis not present

## 2020-08-11 DIAGNOSIS — I83228 Varicose veins of left lower extremity with both ulcer of other part of lower extremity and inflammation: Secondary | ICD-10-CM | POA: Diagnosis not present

## 2020-08-18 DIAGNOSIS — I130 Hypertensive heart and chronic kidney disease with heart failure and stage 1 through stage 4 chronic kidney disease, or unspecified chronic kidney disease: Secondary | ICD-10-CM | POA: Diagnosis not present

## 2020-08-18 DIAGNOSIS — I5032 Chronic diastolic (congestive) heart failure: Secondary | ICD-10-CM | POA: Diagnosis not present

## 2020-08-18 DIAGNOSIS — I83228 Varicose veins of left lower extremity with both ulcer of other part of lower extremity and inflammation: Secondary | ICD-10-CM | POA: Diagnosis not present

## 2020-08-18 DIAGNOSIS — L97821 Non-pressure chronic ulcer of other part of left lower leg limited to breakdown of skin: Secondary | ICD-10-CM | POA: Diagnosis not present

## 2020-08-18 DIAGNOSIS — E1151 Type 2 diabetes mellitus with diabetic peripheral angiopathy without gangrene: Secondary | ICD-10-CM | POA: Diagnosis not present

## 2020-08-18 DIAGNOSIS — I4821 Permanent atrial fibrillation: Secondary | ICD-10-CM | POA: Diagnosis not present

## 2020-08-21 DIAGNOSIS — T07XXXA Unspecified multiple injuries, initial encounter: Secondary | ICD-10-CM | POA: Diagnosis not present

## 2020-08-21 DIAGNOSIS — L97221 Non-pressure chronic ulcer of left calf limited to breakdown of skin: Secondary | ICD-10-CM | POA: Diagnosis not present

## 2020-08-21 DIAGNOSIS — I83022 Varicose veins of left lower extremity with ulcer of calf: Secondary | ICD-10-CM | POA: Diagnosis not present

## 2020-08-21 DIAGNOSIS — L97929 Non-pressure chronic ulcer of unspecified part of left lower leg with unspecified severity: Secondary | ICD-10-CM | POA: Diagnosis not present

## 2020-08-22 DIAGNOSIS — Z7901 Long term (current) use of anticoagulants: Secondary | ICD-10-CM | POA: Diagnosis not present

## 2020-08-22 DIAGNOSIS — I5032 Chronic diastolic (congestive) heart failure: Secondary | ICD-10-CM | POA: Diagnosis not present

## 2020-08-22 DIAGNOSIS — Z8616 Personal history of COVID-19: Secondary | ICD-10-CM | POA: Diagnosis not present

## 2020-08-22 DIAGNOSIS — E1122 Type 2 diabetes mellitus with diabetic chronic kidney disease: Secondary | ICD-10-CM | POA: Diagnosis not present

## 2020-08-22 DIAGNOSIS — I83228 Varicose veins of left lower extremity with both ulcer of other part of lower extremity and inflammation: Secondary | ICD-10-CM | POA: Diagnosis not present

## 2020-08-22 DIAGNOSIS — Z9181 History of falling: Secondary | ICD-10-CM | POA: Diagnosis not present

## 2020-08-22 DIAGNOSIS — L97821 Non-pressure chronic ulcer of other part of left lower leg limited to breakdown of skin: Secondary | ICD-10-CM | POA: Diagnosis not present

## 2020-08-22 DIAGNOSIS — E1151 Type 2 diabetes mellitus with diabetic peripheral angiopathy without gangrene: Secondary | ICD-10-CM | POA: Diagnosis not present

## 2020-08-22 DIAGNOSIS — Z602 Problems related to living alone: Secondary | ICD-10-CM | POA: Diagnosis not present

## 2020-08-22 DIAGNOSIS — I4821 Permanent atrial fibrillation: Secondary | ICD-10-CM | POA: Diagnosis not present

## 2020-08-22 DIAGNOSIS — N184 Chronic kidney disease, stage 4 (severe): Secondary | ICD-10-CM | POA: Diagnosis not present

## 2020-08-22 DIAGNOSIS — I130 Hypertensive heart and chronic kidney disease with heart failure and stage 1 through stage 4 chronic kidney disease, or unspecified chronic kidney disease: Secondary | ICD-10-CM | POA: Diagnosis not present

## 2020-08-25 DIAGNOSIS — I83228 Varicose veins of left lower extremity with both ulcer of other part of lower extremity and inflammation: Secondary | ICD-10-CM | POA: Diagnosis not present

## 2020-08-25 DIAGNOSIS — I4821 Permanent atrial fibrillation: Secondary | ICD-10-CM | POA: Diagnosis not present

## 2020-08-25 DIAGNOSIS — I130 Hypertensive heart and chronic kidney disease with heart failure and stage 1 through stage 4 chronic kidney disease, or unspecified chronic kidney disease: Secondary | ICD-10-CM | POA: Diagnosis not present

## 2020-08-25 DIAGNOSIS — L97821 Non-pressure chronic ulcer of other part of left lower leg limited to breakdown of skin: Secondary | ICD-10-CM | POA: Diagnosis not present

## 2020-08-25 DIAGNOSIS — E1151 Type 2 diabetes mellitus with diabetic peripheral angiopathy without gangrene: Secondary | ICD-10-CM | POA: Diagnosis not present

## 2020-08-25 DIAGNOSIS — I5032 Chronic diastolic (congestive) heart failure: Secondary | ICD-10-CM | POA: Diagnosis not present

## 2020-08-27 DIAGNOSIS — I700 Atherosclerosis of aorta: Secondary | ICD-10-CM | POA: Diagnosis not present

## 2020-08-27 DIAGNOSIS — G40909 Epilepsy, unspecified, not intractable, without status epilepticus: Secondary | ICD-10-CM | POA: Diagnosis not present

## 2020-09-02 DIAGNOSIS — L97821 Non-pressure chronic ulcer of other part of left lower leg limited to breakdown of skin: Secondary | ICD-10-CM | POA: Diagnosis not present

## 2020-09-02 DIAGNOSIS — I130 Hypertensive heart and chronic kidney disease with heart failure and stage 1 through stage 4 chronic kidney disease, or unspecified chronic kidney disease: Secondary | ICD-10-CM | POA: Diagnosis not present

## 2020-09-02 DIAGNOSIS — E1151 Type 2 diabetes mellitus with diabetic peripheral angiopathy without gangrene: Secondary | ICD-10-CM | POA: Diagnosis not present

## 2020-09-02 DIAGNOSIS — I5032 Chronic diastolic (congestive) heart failure: Secondary | ICD-10-CM | POA: Diagnosis not present

## 2020-09-02 DIAGNOSIS — I83228 Varicose veins of left lower extremity with both ulcer of other part of lower extremity and inflammation: Secondary | ICD-10-CM | POA: Diagnosis not present

## 2020-09-02 DIAGNOSIS — I4821 Permanent atrial fibrillation: Secondary | ICD-10-CM | POA: Diagnosis not present

## 2020-09-09 DIAGNOSIS — L97821 Non-pressure chronic ulcer of other part of left lower leg limited to breakdown of skin: Secondary | ICD-10-CM | POA: Diagnosis not present

## 2020-09-09 DIAGNOSIS — E1151 Type 2 diabetes mellitus with diabetic peripheral angiopathy without gangrene: Secondary | ICD-10-CM | POA: Diagnosis not present

## 2020-09-09 DIAGNOSIS — I83228 Varicose veins of left lower extremity with both ulcer of other part of lower extremity and inflammation: Secondary | ICD-10-CM | POA: Diagnosis not present

## 2020-09-09 DIAGNOSIS — I5032 Chronic diastolic (congestive) heart failure: Secondary | ICD-10-CM | POA: Diagnosis not present

## 2020-09-09 DIAGNOSIS — I130 Hypertensive heart and chronic kidney disease with heart failure and stage 1 through stage 4 chronic kidney disease, or unspecified chronic kidney disease: Secondary | ICD-10-CM | POA: Diagnosis not present

## 2020-09-09 DIAGNOSIS — I4821 Permanent atrial fibrillation: Secondary | ICD-10-CM | POA: Diagnosis not present

## 2020-09-11 DIAGNOSIS — I890 Lymphedema, not elsewhere classified: Secondary | ICD-10-CM | POA: Diagnosis not present

## 2020-09-11 DIAGNOSIS — I83022 Varicose veins of left lower extremity with ulcer of calf: Secondary | ICD-10-CM | POA: Diagnosis not present

## 2020-09-11 DIAGNOSIS — T07XXXA Unspecified multiple injuries, initial encounter: Secondary | ICD-10-CM | POA: Diagnosis not present

## 2020-09-11 DIAGNOSIS — L97929 Non-pressure chronic ulcer of unspecified part of left lower leg with unspecified severity: Secondary | ICD-10-CM | POA: Diagnosis not present

## 2020-09-17 DIAGNOSIS — N179 Acute kidney failure, unspecified: Secondary | ICD-10-CM | POA: Diagnosis not present

## 2020-09-17 DIAGNOSIS — J811 Chronic pulmonary edema: Secondary | ICD-10-CM | POA: Diagnosis not present

## 2020-09-17 DIAGNOSIS — L89152 Pressure ulcer of sacral region, stage 2: Secondary | ICD-10-CM | POA: Diagnosis not present

## 2020-09-17 DIAGNOSIS — Z96642 Presence of left artificial hip joint: Secondary | ICD-10-CM | POA: Diagnosis not present

## 2020-09-17 DIAGNOSIS — N183 Chronic kidney disease, stage 3 unspecified: Secondary | ICD-10-CM | POA: Diagnosis not present

## 2020-09-17 DIAGNOSIS — F0280 Dementia in other diseases classified elsewhere without behavioral disturbance: Secondary | ICD-10-CM | POA: Diagnosis not present

## 2020-09-17 DIAGNOSIS — Z20822 Contact with and (suspected) exposure to covid-19: Secondary | ICD-10-CM | POA: Diagnosis not present

## 2020-09-17 DIAGNOSIS — I10 Essential (primary) hypertension: Secondary | ICD-10-CM | POA: Diagnosis not present

## 2020-09-17 DIAGNOSIS — R059 Cough, unspecified: Secondary | ICD-10-CM | POA: Diagnosis not present

## 2020-09-17 DIAGNOSIS — W19XXXA Unspecified fall, initial encounter: Secondary | ICD-10-CM | POA: Diagnosis not present

## 2020-09-17 DIAGNOSIS — I517 Cardiomegaly: Secondary | ICD-10-CM | POA: Diagnosis not present

## 2020-09-17 DIAGNOSIS — R531 Weakness: Secondary | ICD-10-CM | POA: Diagnosis not present

## 2020-09-17 DIAGNOSIS — R600 Localized edema: Secondary | ICD-10-CM | POA: Diagnosis not present

## 2020-09-17 DIAGNOSIS — L03116 Cellulitis of left lower limb: Secondary | ICD-10-CM | POA: Diagnosis not present

## 2020-09-17 DIAGNOSIS — M86162 Other acute osteomyelitis, left tibia and fibula: Secondary | ICD-10-CM | POA: Diagnosis not present

## 2020-09-17 DIAGNOSIS — D631 Anemia in chronic kidney disease: Secondary | ICD-10-CM | POA: Diagnosis not present

## 2020-09-17 DIAGNOSIS — E1122 Type 2 diabetes mellitus with diabetic chronic kidney disease: Secondary | ICD-10-CM | POA: Diagnosis not present

## 2020-09-17 DIAGNOSIS — M7989 Other specified soft tissue disorders: Secondary | ICD-10-CM | POA: Diagnosis not present

## 2020-09-17 DIAGNOSIS — R42 Dizziness and giddiness: Secondary | ICD-10-CM | POA: Diagnosis not present

## 2020-09-18 DIAGNOSIS — R4182 Altered mental status, unspecified: Secondary | ICD-10-CM | POA: Diagnosis not present

## 2020-09-18 DIAGNOSIS — I129 Hypertensive chronic kidney disease with stage 1 through stage 4 chronic kidney disease, or unspecified chronic kidney disease: Secondary | ICD-10-CM | POA: Diagnosis present

## 2020-09-18 DIAGNOSIS — L97929 Non-pressure chronic ulcer of unspecified part of left lower leg with unspecified severity: Secondary | ICD-10-CM | POA: Diagnosis not present

## 2020-09-18 DIAGNOSIS — Z888 Allergy status to other drugs, medicaments and biological substances status: Secondary | ICD-10-CM | POA: Diagnosis not present

## 2020-09-18 DIAGNOSIS — Z66 Do not resuscitate: Secondary | ICD-10-CM | POA: Diagnosis present

## 2020-09-18 DIAGNOSIS — K219 Gastro-esophageal reflux disease without esophagitis: Secondary | ICD-10-CM | POA: Diagnosis present

## 2020-09-18 DIAGNOSIS — D631 Anemia in chronic kidney disease: Secondary | ICD-10-CM | POA: Diagnosis present

## 2020-09-18 DIAGNOSIS — R0902 Hypoxemia: Secondary | ICD-10-CM | POA: Diagnosis not present

## 2020-09-18 DIAGNOSIS — I70299 Other atherosclerosis of native arteries of extremities, unspecified extremity: Secondary | ICD-10-CM | POA: Diagnosis not present

## 2020-09-18 DIAGNOSIS — I890 Lymphedema, not elsewhere classified: Secondary | ICD-10-CM | POA: Diagnosis not present

## 2020-09-18 DIAGNOSIS — N183 Chronic kidney disease, stage 3 unspecified: Secondary | ICD-10-CM | POA: Diagnosis present

## 2020-09-18 DIAGNOSIS — S51011A Laceration without foreign body of right elbow, initial encounter: Secondary | ICD-10-CM | POA: Diagnosis not present

## 2020-09-18 DIAGNOSIS — Z7901 Long term (current) use of anticoagulants: Secondary | ICD-10-CM | POA: Diagnosis not present

## 2020-09-18 DIAGNOSIS — S81801A Unspecified open wound, right lower leg, initial encounter: Secondary | ICD-10-CM | POA: Diagnosis not present

## 2020-09-18 DIAGNOSIS — I10 Essential (primary) hypertension: Secondary | ICD-10-CM | POA: Diagnosis not present

## 2020-09-18 DIAGNOSIS — Z7401 Bed confinement status: Secondary | ICD-10-CM | POA: Diagnosis not present

## 2020-09-18 DIAGNOSIS — R2689 Other abnormalities of gait and mobility: Secondary | ICD-10-CM | POA: Diagnosis not present

## 2020-09-18 DIAGNOSIS — G309 Alzheimer's disease, unspecified: Secondary | ICD-10-CM | POA: Diagnosis present

## 2020-09-18 DIAGNOSIS — E785 Hyperlipidemia, unspecified: Secondary | ICD-10-CM | POA: Diagnosis present

## 2020-09-18 DIAGNOSIS — N179 Acute kidney failure, unspecified: Secondary | ICD-10-CM | POA: Diagnosis present

## 2020-09-18 DIAGNOSIS — E1122 Type 2 diabetes mellitus with diabetic chronic kidney disease: Secondary | ICD-10-CM | POA: Diagnosis present

## 2020-09-18 DIAGNOSIS — Z20822 Contact with and (suspected) exposure to covid-19: Secondary | ICD-10-CM | POA: Diagnosis present

## 2020-09-18 DIAGNOSIS — Z87891 Personal history of nicotine dependence: Secondary | ICD-10-CM | POA: Diagnosis not present

## 2020-09-18 DIAGNOSIS — I480 Paroxysmal atrial fibrillation: Secondary | ICD-10-CM | POA: Diagnosis not present

## 2020-09-18 DIAGNOSIS — L03116 Cellulitis of left lower limb: Secondary | ICD-10-CM | POA: Diagnosis present

## 2020-09-18 DIAGNOSIS — L89152 Pressure ulcer of sacral region, stage 2: Secondary | ICD-10-CM | POA: Diagnosis present

## 2020-09-18 DIAGNOSIS — I2510 Atherosclerotic heart disease of native coronary artery without angina pectoris: Secondary | ICD-10-CM | POA: Diagnosis present

## 2020-09-18 DIAGNOSIS — R41841 Cognitive communication deficit: Secondary | ICD-10-CM | POA: Diagnosis not present

## 2020-09-18 DIAGNOSIS — F0280 Dementia in other diseases classified elsewhere without behavioral disturbance: Secondary | ICD-10-CM | POA: Diagnosis present

## 2020-09-18 DIAGNOSIS — R531 Weakness: Secondary | ICD-10-CM | POA: Diagnosis not present

## 2020-09-18 DIAGNOSIS — S81802A Unspecified open wound, left lower leg, initial encounter: Secondary | ICD-10-CM | POA: Diagnosis not present

## 2020-09-18 DIAGNOSIS — R279 Unspecified lack of coordination: Secondary | ICD-10-CM | POA: Diagnosis not present

## 2020-09-18 DIAGNOSIS — I4891 Unspecified atrial fibrillation: Secondary | ICD-10-CM | POA: Diagnosis not present

## 2020-09-18 DIAGNOSIS — M6281 Muscle weakness (generalized): Secondary | ICD-10-CM | POA: Diagnosis not present

## 2020-09-25 DIAGNOSIS — M6281 Muscle weakness (generalized): Secondary | ICD-10-CM | POA: Diagnosis not present

## 2020-09-25 DIAGNOSIS — Z87891 Personal history of nicotine dependence: Secondary | ICD-10-CM | POA: Diagnosis not present

## 2020-09-25 DIAGNOSIS — Z7901 Long term (current) use of anticoagulants: Secondary | ICD-10-CM | POA: Diagnosis not present

## 2020-09-25 DIAGNOSIS — I70299 Other atherosclerosis of native arteries of extremities, unspecified extremity: Secondary | ICD-10-CM | POA: Diagnosis not present

## 2020-09-25 DIAGNOSIS — Z79899 Other long term (current) drug therapy: Secondary | ICD-10-CM | POA: Diagnosis not present

## 2020-09-25 DIAGNOSIS — R0902 Hypoxemia: Secondary | ICD-10-CM | POA: Diagnosis not present

## 2020-09-25 DIAGNOSIS — E785 Hyperlipidemia, unspecified: Secondary | ICD-10-CM | POA: Diagnosis not present

## 2020-09-25 DIAGNOSIS — R279 Unspecified lack of coordination: Secondary | ICD-10-CM | POA: Diagnosis not present

## 2020-09-25 DIAGNOSIS — Z8673 Personal history of transient ischemic attack (TIA), and cerebral infarction without residual deficits: Secondary | ICD-10-CM | POA: Diagnosis not present

## 2020-09-25 DIAGNOSIS — Z7401 Bed confinement status: Secondary | ICD-10-CM | POA: Diagnosis not present

## 2020-09-25 DIAGNOSIS — I10 Essential (primary) hypertension: Secondary | ICD-10-CM | POA: Diagnosis not present

## 2020-09-25 DIAGNOSIS — I890 Lymphedema, not elsewhere classified: Secondary | ICD-10-CM | POA: Diagnosis not present

## 2020-09-25 DIAGNOSIS — E11622 Type 2 diabetes mellitus with other skin ulcer: Secondary | ICD-10-CM | POA: Diagnosis not present

## 2020-09-25 DIAGNOSIS — R2689 Other abnormalities of gait and mobility: Secondary | ICD-10-CM | POA: Diagnosis not present

## 2020-09-25 DIAGNOSIS — E1151 Type 2 diabetes mellitus with diabetic peripheral angiopathy without gangrene: Secondary | ICD-10-CM | POA: Diagnosis not present

## 2020-09-25 DIAGNOSIS — L03116 Cellulitis of left lower limb: Secondary | ICD-10-CM | POA: Diagnosis not present

## 2020-09-25 DIAGNOSIS — L97221 Non-pressure chronic ulcer of left calf limited to breakdown of skin: Secondary | ICD-10-CM | POA: Diagnosis not present

## 2020-09-25 DIAGNOSIS — L97829 Non-pressure chronic ulcer of other part of left lower leg with unspecified severity: Secondary | ICD-10-CM | POA: Diagnosis not present

## 2020-09-25 DIAGNOSIS — I83028 Varicose veins of left lower extremity with ulcer other part of lower leg: Secondary | ICD-10-CM | POA: Diagnosis not present

## 2020-09-25 DIAGNOSIS — E1122 Type 2 diabetes mellitus with diabetic chronic kidney disease: Secondary | ICD-10-CM | POA: Diagnosis not present

## 2020-09-25 DIAGNOSIS — N179 Acute kidney failure, unspecified: Secondary | ICD-10-CM | POA: Diagnosis not present

## 2020-09-25 DIAGNOSIS — L97929 Non-pressure chronic ulcer of unspecified part of left lower leg with unspecified severity: Secondary | ICD-10-CM | POA: Diagnosis not present

## 2020-09-25 DIAGNOSIS — R4182 Altered mental status, unspecified: Secondary | ICD-10-CM | POA: Diagnosis not present

## 2020-09-25 DIAGNOSIS — G309 Alzheimer's disease, unspecified: Secondary | ICD-10-CM | POA: Diagnosis not present

## 2020-09-25 DIAGNOSIS — I4891 Unspecified atrial fibrillation: Secondary | ICD-10-CM | POA: Diagnosis not present

## 2020-09-25 DIAGNOSIS — R41841 Cognitive communication deficit: Secondary | ICD-10-CM | POA: Diagnosis not present

## 2020-09-25 DIAGNOSIS — T07XXXA Unspecified multiple injuries, initial encounter: Secondary | ICD-10-CM | POA: Diagnosis not present

## 2020-09-25 DIAGNOSIS — I83022 Varicose veins of left lower extremity with ulcer of calf: Secondary | ICD-10-CM | POA: Diagnosis not present

## 2020-09-25 DIAGNOSIS — Z86718 Personal history of other venous thrombosis and embolism: Secondary | ICD-10-CM | POA: Diagnosis not present

## 2020-09-25 DIAGNOSIS — N183 Chronic kidney disease, stage 3 unspecified: Secondary | ICD-10-CM | POA: Diagnosis not present

## 2020-09-25 DIAGNOSIS — I480 Paroxysmal atrial fibrillation: Secondary | ICD-10-CM | POA: Diagnosis not present

## 2020-09-25 DIAGNOSIS — I129 Hypertensive chronic kidney disease with stage 1 through stage 4 chronic kidney disease, or unspecified chronic kidney disease: Secondary | ICD-10-CM | POA: Diagnosis not present

## 2020-09-25 DIAGNOSIS — I2510 Atherosclerotic heart disease of native coronary artery without angina pectoris: Secondary | ICD-10-CM | POA: Diagnosis not present

## 2020-10-09 DIAGNOSIS — I129 Hypertensive chronic kidney disease with stage 1 through stage 4 chronic kidney disease, or unspecified chronic kidney disease: Secondary | ICD-10-CM | POA: Diagnosis not present

## 2020-10-09 DIAGNOSIS — E1122 Type 2 diabetes mellitus with diabetic chronic kidney disease: Secondary | ICD-10-CM | POA: Diagnosis not present

## 2020-10-09 DIAGNOSIS — L03116 Cellulitis of left lower limb: Secondary | ICD-10-CM | POA: Diagnosis not present

## 2020-10-09 DIAGNOSIS — T07XXXA Unspecified multiple injuries, initial encounter: Secondary | ICD-10-CM | POA: Diagnosis not present

## 2020-10-09 DIAGNOSIS — N183 Chronic kidney disease, stage 3 unspecified: Secondary | ICD-10-CM | POA: Diagnosis not present

## 2020-10-09 DIAGNOSIS — I890 Lymphedema, not elsewhere classified: Secondary | ICD-10-CM | POA: Diagnosis not present

## 2020-10-09 DIAGNOSIS — I2510 Atherosclerotic heart disease of native coronary artery without angina pectoris: Secondary | ICD-10-CM | POA: Diagnosis not present

## 2020-10-16 DIAGNOSIS — L97221 Non-pressure chronic ulcer of left calf limited to breakdown of skin: Secondary | ICD-10-CM | POA: Diagnosis not present

## 2020-10-16 DIAGNOSIS — I2510 Atherosclerotic heart disease of native coronary artery without angina pectoris: Secondary | ICD-10-CM | POA: Diagnosis not present

## 2020-10-16 DIAGNOSIS — I129 Hypertensive chronic kidney disease with stage 1 through stage 4 chronic kidney disease, or unspecified chronic kidney disease: Secondary | ICD-10-CM | POA: Diagnosis not present

## 2020-10-16 DIAGNOSIS — N183 Chronic kidney disease, stage 3 unspecified: Secondary | ICD-10-CM | POA: Diagnosis not present

## 2020-10-16 DIAGNOSIS — E1122 Type 2 diabetes mellitus with diabetic chronic kidney disease: Secondary | ICD-10-CM | POA: Diagnosis not present

## 2020-10-16 DIAGNOSIS — I83022 Varicose veins of left lower extremity with ulcer of calf: Secondary | ICD-10-CM | POA: Diagnosis not present

## 2020-10-16 DIAGNOSIS — I890 Lymphedema, not elsewhere classified: Secondary | ICD-10-CM | POA: Diagnosis not present

## 2020-10-16 DIAGNOSIS — T07XXXA Unspecified multiple injuries, initial encounter: Secondary | ICD-10-CM | POA: Diagnosis not present

## 2020-11-13 DIAGNOSIS — I480 Paroxysmal atrial fibrillation: Secondary | ICD-10-CM | POA: Diagnosis not present

## 2020-11-13 DIAGNOSIS — E11622 Type 2 diabetes mellitus with other skin ulcer: Secondary | ICD-10-CM | POA: Diagnosis not present

## 2020-11-13 DIAGNOSIS — E1122 Type 2 diabetes mellitus with diabetic chronic kidney disease: Secondary | ICD-10-CM | POA: Diagnosis not present

## 2020-11-13 DIAGNOSIS — E1151 Type 2 diabetes mellitus with diabetic peripheral angiopathy without gangrene: Secondary | ICD-10-CM | POA: Diagnosis not present

## 2020-11-13 DIAGNOSIS — L97829 Non-pressure chronic ulcer of other part of left lower leg with unspecified severity: Secondary | ICD-10-CM | POA: Diagnosis not present

## 2020-11-13 DIAGNOSIS — I83022 Varicose veins of left lower extremity with ulcer of calf: Secondary | ICD-10-CM | POA: Diagnosis not present

## 2020-11-13 DIAGNOSIS — I83028 Varicose veins of left lower extremity with ulcer other part of lower leg: Secondary | ICD-10-CM | POA: Diagnosis not present

## 2020-11-13 DIAGNOSIS — L97221 Non-pressure chronic ulcer of left calf limited to breakdown of skin: Secondary | ICD-10-CM | POA: Diagnosis not present

## 2020-11-21 DIAGNOSIS — D631 Anemia in chronic kidney disease: Secondary | ICD-10-CM | POA: Diagnosis not present

## 2020-11-21 DIAGNOSIS — E1122 Type 2 diabetes mellitus with diabetic chronic kidney disease: Secondary | ICD-10-CM | POA: Diagnosis not present

## 2020-11-21 DIAGNOSIS — L97921 Non-pressure chronic ulcer of unspecified part of left lower leg limited to breakdown of skin: Secondary | ICD-10-CM | POA: Diagnosis not present

## 2020-11-21 DIAGNOSIS — G309 Alzheimer's disease, unspecified: Secondary | ICD-10-CM | POA: Diagnosis not present

## 2020-11-21 DIAGNOSIS — Z7901 Long term (current) use of anticoagulants: Secondary | ICD-10-CM | POA: Diagnosis not present

## 2020-11-21 DIAGNOSIS — I70202 Unspecified atherosclerosis of native arteries of extremities, left leg: Secondary | ICD-10-CM | POA: Diagnosis not present

## 2020-11-21 DIAGNOSIS — I129 Hypertensive chronic kidney disease with stage 1 through stage 4 chronic kidney disease, or unspecified chronic kidney disease: Secondary | ICD-10-CM | POA: Diagnosis not present

## 2020-11-21 DIAGNOSIS — Z87891 Personal history of nicotine dependence: Secondary | ICD-10-CM | POA: Diagnosis not present

## 2020-11-21 DIAGNOSIS — R001 Bradycardia, unspecified: Secondary | ICD-10-CM | POA: Diagnosis not present

## 2020-11-21 DIAGNOSIS — Z86718 Personal history of other venous thrombosis and embolism: Secondary | ICD-10-CM | POA: Diagnosis not present

## 2020-11-21 DIAGNOSIS — I480 Paroxysmal atrial fibrillation: Secondary | ICD-10-CM | POA: Diagnosis not present

## 2020-11-21 DIAGNOSIS — I959 Hypotension, unspecified: Secondary | ICD-10-CM | POA: Diagnosis not present

## 2020-11-21 DIAGNOSIS — I2510 Atherosclerotic heart disease of native coronary artery without angina pectoris: Secondary | ICD-10-CM | POA: Diagnosis not present

## 2020-11-21 DIAGNOSIS — N179 Acute kidney failure, unspecified: Secondary | ICD-10-CM | POA: Diagnosis not present

## 2020-11-21 DIAGNOSIS — Z9181 History of falling: Secondary | ICD-10-CM | POA: Diagnosis not present

## 2020-11-21 DIAGNOSIS — F32A Depression, unspecified: Secondary | ICD-10-CM | POA: Diagnosis not present

## 2020-11-21 DIAGNOSIS — L89152 Pressure ulcer of sacral region, stage 2: Secondary | ICD-10-CM | POA: Diagnosis not present

## 2020-11-21 DIAGNOSIS — E1151 Type 2 diabetes mellitus with diabetic peripheral angiopathy without gangrene: Secondary | ICD-10-CM | POA: Diagnosis not present

## 2020-11-21 DIAGNOSIS — Z8673 Personal history of transient ischemic attack (TIA), and cerebral infarction without residual deficits: Secondary | ICD-10-CM | POA: Diagnosis not present

## 2020-11-21 DIAGNOSIS — F0280 Dementia in other diseases classified elsewhere without behavioral disturbance: Secondary | ICD-10-CM | POA: Diagnosis not present

## 2020-11-21 DIAGNOSIS — I83022 Varicose veins of left lower extremity with ulcer of calf: Secondary | ICD-10-CM | POA: Diagnosis not present

## 2020-11-21 DIAGNOSIS — N184 Chronic kidney disease, stage 4 (severe): Secondary | ICD-10-CM | POA: Diagnosis not present

## 2020-11-21 DIAGNOSIS — I890 Lymphedema, not elsewhere classified: Secondary | ICD-10-CM | POA: Diagnosis not present

## 2020-11-24 DIAGNOSIS — L89152 Pressure ulcer of sacral region, stage 2: Secondary | ICD-10-CM | POA: Diagnosis not present

## 2020-11-24 DIAGNOSIS — E1151 Type 2 diabetes mellitus with diabetic peripheral angiopathy without gangrene: Secondary | ICD-10-CM | POA: Diagnosis not present

## 2020-11-24 DIAGNOSIS — I83022 Varicose veins of left lower extremity with ulcer of calf: Secondary | ICD-10-CM | POA: Diagnosis not present

## 2020-11-24 DIAGNOSIS — L97921 Non-pressure chronic ulcer of unspecified part of left lower leg limited to breakdown of skin: Secondary | ICD-10-CM | POA: Diagnosis not present

## 2020-11-24 DIAGNOSIS — I890 Lymphedema, not elsewhere classified: Secondary | ICD-10-CM | POA: Diagnosis not present

## 2020-11-24 DIAGNOSIS — I70202 Unspecified atherosclerosis of native arteries of extremities, left leg: Secondary | ICD-10-CM | POA: Diagnosis not present

## 2020-11-28 DEATH — deceased

## 2020-12-24 DIAGNOSIS — I70202 Unspecified atherosclerosis of native arteries of extremities, left leg: Secondary | ICD-10-CM | POA: Diagnosis not present

## 2020-12-24 DIAGNOSIS — F0280 Dementia in other diseases classified elsewhere without behavioral disturbance: Secondary | ICD-10-CM | POA: Diagnosis not present

## 2020-12-24 DIAGNOSIS — L97921 Non-pressure chronic ulcer of unspecified part of left lower leg limited to breakdown of skin: Secondary | ICD-10-CM | POA: Diagnosis not present

## 2020-12-24 DIAGNOSIS — L89152 Pressure ulcer of sacral region, stage 2: Secondary | ICD-10-CM | POA: Diagnosis not present

## 2020-12-24 DIAGNOSIS — E1151 Type 2 diabetes mellitus with diabetic peripheral angiopathy without gangrene: Secondary | ICD-10-CM | POA: Diagnosis not present

## 2020-12-24 DIAGNOSIS — I880 Nonspecific mesenteric lymphadenitis: Secondary | ICD-10-CM | POA: Diagnosis not present

## 2020-12-24 DIAGNOSIS — I83022 Varicose veins of left lower extremity with ulcer of calf: Secondary | ICD-10-CM | POA: Diagnosis not present

## 2020-12-24 DIAGNOSIS — G309 Alzheimer's disease, unspecified: Secondary | ICD-10-CM | POA: Diagnosis not present
